# Patient Record
Sex: Female | Born: 1955 | Race: White | Hispanic: No | Smoking: Former smoker
Health system: Southern US, Community
[De-identification: ages and names within clinical notes are randomized; demographics above are authoritative.]

## PROBLEM LIST (undated history)

## (undated) DIAGNOSIS — Z806 Family history of leukemia: Secondary | ICD-10-CM

## (undated) DIAGNOSIS — K5790 Diverticulosis of intestine, part unspecified, without perforation or abscess without bleeding: Secondary | ICD-10-CM

## (undated) DIAGNOSIS — Z8043 Family history of malignant neoplasm of testis: Secondary | ICD-10-CM

## (undated) DIAGNOSIS — I10 Essential (primary) hypertension: Secondary | ICD-10-CM

## (undated) DIAGNOSIS — M19071 Primary osteoarthritis, right ankle and foot: Secondary | ICD-10-CM

## (undated) DIAGNOSIS — C801 Malignant (primary) neoplasm, unspecified: Secondary | ICD-10-CM

## (undated) DIAGNOSIS — N390 Urinary tract infection, site not specified: Secondary | ICD-10-CM

## (undated) DIAGNOSIS — E669 Obesity, unspecified: Secondary | ICD-10-CM

## (undated) DIAGNOSIS — I119 Hypertensive heart disease without heart failure: Secondary | ICD-10-CM

## (undated) DIAGNOSIS — R809 Proteinuria, unspecified: Secondary | ICD-10-CM

## (undated) DIAGNOSIS — Z923 Personal history of irradiation: Secondary | ICD-10-CM

## (undated) DIAGNOSIS — I5042 Chronic combined systolic (congestive) and diastolic (congestive) heart failure: Secondary | ICD-10-CM

## (undated) DIAGNOSIS — N951 Menopausal and female climacteric states: Secondary | ICD-10-CM

## (undated) DIAGNOSIS — K279 Peptic ulcer, site unspecified, unspecified as acute or chronic, without hemorrhage or perforation: Secondary | ICD-10-CM

## (undated) DIAGNOSIS — I428 Other cardiomyopathies: Secondary | ICD-10-CM

## (undated) DIAGNOSIS — N183 Chronic kidney disease, stage 3 unspecified: Secondary | ICD-10-CM

## (undated) DIAGNOSIS — R112 Nausea with vomiting, unspecified: Secondary | ICD-10-CM

## (undated) DIAGNOSIS — B373 Candidiasis of vulva and vagina: Secondary | ICD-10-CM

## (undated) DIAGNOSIS — Z9889 Other specified postprocedural states: Secondary | ICD-10-CM

## (undated) DIAGNOSIS — Z801 Family history of malignant neoplasm of trachea, bronchus and lung: Secondary | ICD-10-CM

## (undated) DIAGNOSIS — D509 Iron deficiency anemia, unspecified: Secondary | ICD-10-CM

## (undated) DIAGNOSIS — M19072 Primary osteoarthritis, left ankle and foot: Secondary | ICD-10-CM

## (undated) DIAGNOSIS — Z8719 Personal history of other diseases of the digestive system: Secondary | ICD-10-CM

## (undated) DIAGNOSIS — L409 Psoriasis, unspecified: Secondary | ICD-10-CM

## (undated) DIAGNOSIS — E785 Hyperlipidemia, unspecified: Secondary | ICD-10-CM

## (undated) DIAGNOSIS — Z800 Family history of malignant neoplasm of digestive organs: Secondary | ICD-10-CM

## (undated) DIAGNOSIS — Z9221 Personal history of antineoplastic chemotherapy: Secondary | ICD-10-CM

## (undated) DIAGNOSIS — E1169 Type 2 diabetes mellitus with other specified complication: Secondary | ICD-10-CM

## (undated) DIAGNOSIS — Z720 Tobacco use: Secondary | ICD-10-CM

## (undated) DIAGNOSIS — N2581 Secondary hyperparathyroidism of renal origin: Secondary | ICD-10-CM

## (undated) DIAGNOSIS — B3731 Acute candidiasis of vulva and vagina: Secondary | ICD-10-CM

## (undated) DIAGNOSIS — K760 Fatty (change of) liver, not elsewhere classified: Secondary | ICD-10-CM

## (undated) DIAGNOSIS — Z803 Family history of malignant neoplasm of breast: Secondary | ICD-10-CM

## (undated) DIAGNOSIS — R0600 Dyspnea, unspecified: Secondary | ICD-10-CM

## (undated) HISTORY — PX: DILATION AND CURETTAGE OF UTERUS: SHX78

## (undated) HISTORY — DX: Acute candidiasis of vulva and vagina: B37.31

## (undated) HISTORY — DX: Primary osteoarthritis, right ankle and foot: M19.072

## (undated) HISTORY — DX: Type 2 diabetes mellitus with other specified complication: E11.69

## (undated) HISTORY — DX: Family history of malignant neoplasm of testis: Z80.43

## (undated) HISTORY — PX: COLONOSCOPY: SHX174

## (undated) HISTORY — DX: Family history of malignant neoplasm of trachea, bronchus and lung: Z80.1

## (undated) HISTORY — DX: Family history of malignant neoplasm of digestive organs: Z80.0

## (undated) HISTORY — DX: Psoriasis, unspecified: L40.9

## (undated) HISTORY — DX: Hypertensive heart disease without heart failure: I11.9

## (undated) HISTORY — DX: Chronic kidney disease, stage 3 (moderate): N18.3

## (undated) HISTORY — DX: Urinary tract infection, site not specified: N39.0

## (undated) HISTORY — DX: Obesity, unspecified: E66.9

## (undated) HISTORY — DX: Menopausal and female climacteric states: N95.1

## (undated) HISTORY — DX: Iron deficiency anemia, unspecified: D50.9

## (undated) HISTORY — DX: Family history of malignant neoplasm of breast: Z80.3

## (undated) HISTORY — DX: Peptic ulcer, site unspecified, unspecified as acute or chronic, without hemorrhage or perforation: K27.9

## (undated) HISTORY — DX: Other cardiomyopathies: I42.8

## (undated) HISTORY — DX: Family history of leukemia: Z80.6

## (undated) HISTORY — PX: OTHER SURGICAL HISTORY: SHX169

## (undated) HISTORY — DX: Proteinuria, unspecified: R80.9

## (undated) HISTORY — DX: Secondary hyperparathyroidism of renal origin: N25.81

## (undated) HISTORY — DX: Hyperlipidemia, unspecified: E78.5

## (undated) HISTORY — DX: Other disorders of phosphorus metabolism: E83.39

## (undated) HISTORY — DX: Fatty (change of) liver, not elsewhere classified: K76.0

## (undated) HISTORY — DX: Primary osteoarthritis, right ankle and foot: M19.071

## (undated) HISTORY — DX: Personal history of other diseases of the digestive system: Z87.19

## (undated) HISTORY — DX: Chronic kidney disease, stage 3 unspecified: N18.30

## (undated) HISTORY — DX: Candidiasis of vulva and vagina: B37.3

## (undated) HISTORY — DX: Chronic combined systolic (congestive) and diastolic (congestive) heart failure: I50.42

## (undated) HISTORY — DX: Tobacco use: Z72.0

---

## 2011-03-28 ENCOUNTER — Inpatient Hospital Stay (HOSPITAL_COMMUNITY)
Admission: EM | Admit: 2011-03-28 | Discharge: 2011-04-01 | DRG: 379 | Disposition: A | Discharge status: Home / Self Care | Payer: Managed Care, Other (non HMO) | Attending: Internal Medicine | Admitting: Internal Medicine

## 2011-03-28 ENCOUNTER — Emergency Department (HOSPITAL_COMMUNITY): Payer: Managed Care, Other (non HMO)

## 2011-03-28 DIAGNOSIS — E876 Hypokalemia: Secondary | ICD-10-CM | POA: Diagnosis present

## 2011-03-28 DIAGNOSIS — K5730 Diverticulosis of large intestine without perforation or abscess without bleeding: Secondary | ICD-10-CM | POA: Diagnosis present

## 2011-03-28 DIAGNOSIS — N899 Noninflammatory disorder of vagina, unspecified: Secondary | ICD-10-CM | POA: Diagnosis present

## 2011-03-28 DIAGNOSIS — K439 Ventral hernia without obstruction or gangrene: Secondary | ICD-10-CM | POA: Diagnosis present

## 2011-03-28 DIAGNOSIS — I10 Essential (primary) hypertension: Secondary | ICD-10-CM | POA: Diagnosis present

## 2011-03-28 DIAGNOSIS — K921 Melena: Principal | ICD-10-CM | POA: Diagnosis present

## 2011-03-28 DIAGNOSIS — K648 Other hemorrhoids: Secondary | ICD-10-CM | POA: Diagnosis present

## 2011-03-28 DIAGNOSIS — K5900 Constipation, unspecified: Secondary | ICD-10-CM | POA: Diagnosis present

## 2011-03-28 DIAGNOSIS — K429 Umbilical hernia without obstruction or gangrene: Secondary | ICD-10-CM | POA: Diagnosis present

## 2011-03-28 DIAGNOSIS — K644 Residual hemorrhoidal skin tags: Secondary | ICD-10-CM | POA: Diagnosis present

## 2011-03-28 LAB — CBC
HCT: 41.4 % (ref 36.0–46.0)
Hemoglobin: 14 g/dL (ref 12.0–15.0)
MCH: 28.1 pg (ref 26.0–34.0)
MCHC: 33.8 g/dL (ref 30.0–36.0)
MCV: 83.1 fL (ref 78.0–100.0)
Platelets: 195 10*3/uL (ref 150–400)
RBC: 4.98 MIL/uL (ref 3.87–5.11)
RDW: 13.9 % (ref 11.5–15.5)
WBC: 8.7 10*3/uL (ref 4.0–10.5)

## 2011-03-28 LAB — URINALYSIS, ROUTINE W REFLEX MICROSCOPIC
Bilirubin Urine: NEGATIVE
Glucose, UA: 100 mg/dL — AB
Ketones, ur: NEGATIVE mg/dL
Leukocytes, UA: NEGATIVE
Nitrite: NEGATIVE
Protein, ur: 100 mg/dL — AB
Specific Gravity, Urine: 1.014 (ref 1.005–1.030)
Urobilinogen, UA: 0.2 mg/dL (ref 0.0–1.0)
pH: 7 (ref 5.0–8.0)

## 2011-03-28 LAB — DIFFERENTIAL
Basophils Absolute: 0 10*3/uL (ref 0.0–0.1)
Basophils Relative: 0 % (ref 0–1)
Eosinophils Absolute: 0.1 10*3/uL (ref 0.0–0.7)
Eosinophils Relative: 1 % (ref 0–5)
Lymphocytes Relative: 11 % — ABNORMAL LOW (ref 12–46)
Lymphs Abs: 0.9 10*3/uL (ref 0.7–4.0)
Monocytes Absolute: 0.5 10*3/uL (ref 0.1–1.0)
Monocytes Relative: 6 % (ref 3–12)
Neutro Abs: 7.1 10*3/uL (ref 1.7–7.7)
Neutrophils Relative %: 82 % — ABNORMAL HIGH (ref 43–77)

## 2011-03-28 LAB — URINE MICROSCOPIC-ADD ON

## 2011-03-28 LAB — OCCULT BLOOD, POC DEVICE: Fecal Occult Bld: POSITIVE

## 2011-03-28 LAB — PROTIME-INR
INR: 0.9 (ref 0.00–1.49)
Prothrombin Time: 12.4 seconds (ref 11.6–15.2)

## 2011-03-28 LAB — TYPE AND SCREEN
ABO/RH(D): O POS
Antibody Screen: NEGATIVE

## 2011-03-28 LAB — ABO/RH: ABO/RH(D): O POS

## 2011-03-28 LAB — APTT: aPTT: 28 seconds (ref 24–37)

## 2011-03-28 MED ORDER — IOHEXOL 300 MG/ML  SOLN: 100.0000 mL | Freq: Once | INTRAMUSCULAR | Status: AC | PRN

## 2011-03-29 LAB — BASIC METABOLIC PANEL
BUN: 16 mg/dL (ref 6–23)
BUN: 13 mg/dL (ref 6–23)
CO2: 25 mEq/L (ref 19–32)
CO2: 23 mEq/L (ref 19–32)
Calcium: 8.1 mg/dL — ABNORMAL LOW (ref 8.4–10.5)
Calcium: 8.5 mg/dL (ref 8.4–10.5)
Chloride: 109 mEq/L (ref 96–112)
Chloride: 110 mEq/L (ref 96–112)
Creatinine, Ser: 1 mg/dL (ref 0.4–1.2)
Creatinine, Ser: 0.97 mg/dL (ref 0.4–1.2)
GFR calc Af Amer: >60 mL/min (ref >60)
GFR calc Af Amer: >60 mL/min (ref >60)
GFR calc non Af Amer: 57 mL/min — ABNORMAL LOW (ref >60)
GFR calc non Af Amer: 59 mL/min — ABNORMAL LOW (ref >60)
Glucose, Bld: 105 mg/dL — ABNORMAL HIGH (ref 70–99)
Glucose, Bld: 101 mg/dL — ABNORMAL HIGH (ref 70–99)
Potassium: 3 mEq/L — ABNORMAL LOW (ref 3.5–5.1)
Potassium: 4.1 mEq/L (ref 3.5–5.1)
Sodium: 140 mEq/L (ref 135–145)
Sodium: 138 mEq/L (ref 135–145)

## 2011-03-29 LAB — CBC
HCT: 38.5 % (ref 36.0–46.0)
HCT: 40.5 % (ref 36.0–46.0)
Hemoglobin: 12.8 g/dL (ref 12.0–15.0)
Hemoglobin: 13.3 g/dL (ref 12.0–15.0)
MCH: 27.6 pg (ref 26.0–34.0)
MCH: 27.5 pg (ref 26.0–34.0)
MCHC: 33.2 g/dL (ref 30.0–36.0)
MCHC: 32.8 g/dL (ref 30.0–36.0)
MCV: 83.2 fL (ref 78.0–100.0)
MCV: 83.9 fL (ref 78.0–100.0)
Platelets: 184 10*3/uL (ref 150–400)
Platelets: 201 10*3/uL (ref 150–400)
RBC: 4.63 MIL/uL (ref 3.87–5.11)
RBC: 4.83 MIL/uL (ref 3.87–5.11)
RDW: 13.8 % (ref 11.5–15.5)
RDW: 14.1 % (ref 11.5–15.5)
WBC: 7.4 10*3/uL (ref 4.0–10.5)
WBC: 6.4 10*3/uL (ref 4.0–10.5)

## 2011-03-29 LAB — HEMOGLOBIN AND HEMATOCRIT, BLOOD
HCT: 39.4 % (ref 36.0–46.0)
HCT: 40.8 % (ref 36.0–46.0)
Hemoglobin: 13 g/dL (ref 12.0–15.0)
Hemoglobin: 13.5 g/dL (ref 12.0–15.0)

## 2011-03-29 MED ORDER — IOHEXOL 300 MG/ML  SOLN
100.0000 mL | Freq: Once | INTRAMUSCULAR | Status: AC | PRN
  Administered 2011-03-29: 100 mL via INTRAVENOUS

## 2011-03-30 ENCOUNTER — Other Ambulatory Visit: Payer: Self-pay | Admitting: Gastroenterology

## 2011-03-30 LAB — DIFFERENTIAL
Basophils Absolute: 0 10*3/uL (ref 0.0–0.1)
Basophils Relative: 0 % (ref 0–1)
Eosinophils Absolute: 0.2 10*3/uL (ref 0.0–0.7)
Eosinophils Relative: 4 % (ref 0–5)
Lymphocytes Relative: 32 % (ref 12–46)
Lymphs Abs: 1.9 10*3/uL (ref 0.7–4.0)
Monocytes Absolute: 0.4 10*3/uL (ref 0.1–1.0)
Monocytes Relative: 6 % (ref 3–12)
Neutro Abs: 3.6 10*3/uL (ref 1.7–7.7)
Neutrophils Relative %: 58 % (ref 43–77)

## 2011-03-30 LAB — CBC
HCT: 39.2 % (ref 36.0–46.0)
HCT: 40.1 % (ref 36.0–46.0)
HCT: 38.3 % (ref 36.0–46.0)
HCT: 36.1 % (ref 36.0–46.0)
Hemoglobin: 12.8 g/dL (ref 12.0–15.0)
Hemoglobin: 12.8 g/dL (ref 12.0–15.0)
Hemoglobin: 12.3 g/dL (ref 12.0–15.0)
Hemoglobin: 11.8 g/dL — ABNORMAL LOW (ref 12.0–15.0)
MCH: 27.5 pg (ref 26.0–34.0)
MCH: 27.1 pg (ref 26.0–34.0)
MCH: 27.3 pg (ref 26.0–34.0)
MCH: 27.6 pg (ref 26.0–34.0)
MCHC: 32.7 g/dL (ref 30.0–36.0)
MCHC: 31.9 g/dL (ref 30.0–36.0)
MCHC: 32.1 g/dL (ref 30.0–36.0)
MCHC: 32.7 g/dL (ref 30.0–36.0)
MCV: 84.3 fL (ref 78.0–100.0)
MCV: 84.8 fL (ref 78.0–100.0)
MCV: 85.1 fL (ref 78.0–100.0)
MCV: 84.5 fL (ref 78.0–100.0)
Platelets: 160 10*3/uL (ref 150–400)
Platelets: 185 10*3/uL (ref 150–400)
Platelets: 185 10*3/uL (ref 150–400)
Platelets: 182 10*3/uL (ref 150–400)
RBC: 4.65 MIL/uL (ref 3.87–5.11)
RBC: 4.73 MIL/uL (ref 3.87–5.11)
RBC: 4.5 MIL/uL (ref 3.87–5.11)
RBC: 4.27 MIL/uL (ref 3.87–5.11)
RDW: 14 % (ref 11.5–15.5)
RDW: 14 % (ref 11.5–15.5)
RDW: 14.1 % (ref 11.5–15.5)
RDW: 14.1 % (ref 11.5–15.5)
WBC: 6.1 10*3/uL (ref 4.0–10.5)
WBC: 5.3 10*3/uL (ref 4.0–10.5)
WBC: 5.6 10*3/uL (ref 4.0–10.5)
WBC: 6.7 10*3/uL (ref 4.0–10.5)

## 2011-03-30 LAB — BASIC METABOLIC PANEL
BUN: 21 mg/dL (ref 6–23)
CO2: 16 mEq/L — ABNORMAL LOW (ref 19–32)
Calcium: 8.7 mg/dL (ref 8.4–10.5)
Chloride: 106 mEq/L (ref 96–112)
Creatinine, Ser: 1.21 mg/dL — ABNORMAL HIGH (ref 0.4–1.2)
GFR calc Af Amer: 56 mL/min — ABNORMAL LOW (ref >60)
GFR calc non Af Amer: 46 mL/min — ABNORMAL LOW (ref >60)
Glucose, Bld: 130 mg/dL — ABNORMAL HIGH (ref 70–99)
Potassium: 2.7 mEq/L — CL (ref 3.5–5.1)
Sodium: 139 mEq/L (ref 135–145)

## 2011-03-30 LAB — COMPREHENSIVE METABOLIC PANEL
ALT: 24 U/L (ref 0–35)
AST: 20 U/L (ref 0–37)
Albumin: 3.3 g/dL — ABNORMAL LOW (ref 3.5–5.2)
Alkaline Phosphatase: 61 U/L (ref 39–117)
BUN: 11 mg/dL (ref 6–23)
CO2: 24 mEq/L (ref 19–32)
Calcium: 8.5 mg/dL (ref 8.4–10.5)
Chloride: 112 mEq/L (ref 96–112)
Creatinine, Ser: 0.97 mg/dL (ref 0.4–1.2)
GFR calc Af Amer: >60 mL/min (ref >60)
GFR calc non Af Amer: 59 mL/min — ABNORMAL LOW (ref >60)
Glucose, Bld: 93 mg/dL (ref 70–99)
Potassium: 4 mEq/L (ref 3.5–5.1)
Sodium: 138 mEq/L (ref 135–145)
Total Bilirubin: 0.9 mg/dL (ref 0.3–1.2)
Total Protein: 5.8 g/dL — ABNORMAL LOW (ref 6.0–8.3)

## 2011-03-30 LAB — MAGNESIUM: Magnesium: 2.1 mg/dL (ref 1.5–2.5)

## 2011-03-30 NOTE — Consult Note (Signed)
[name], [name]                   ACCOUNT NO.:  [id number]  MEDICAL RECORD NO.:  [id number]           PATIENT TYPE:  I  LOCATION:  4506                         FACILITY:  [facility]  PHYSICIAN:  Nelwyn Salisbury, MD, FACGDATE OF BIRTH:  [birth year]  DATE OF CONSULTATION:  03/29/2011 DATE OF DISCHARGE:                                CONSULTATION   REASON FOR CONSULTATION:  Melena with bright red bleeding per rectum in a 55 year old white female.  ASSESSMENT: 1. Melanic stools with bright red bleeding per rectum.  Rule out     peptic ulcer disease, colonic polyps, mass, arteriovenous     malformations, etcetera. 2. Chronic constipation. 3. Reducible umbilical hernia. 4. Hypertension.  RECOMMENDATIONS: 1. Esophagogastroduodenoscopy tomorrow morning. 2. Continue with proton pump inhibitor. 3. Avoid all nonsteroidals for now. 4. Colonoscopy if esophagogastroduodenoscopy is unrevealing.  DISCUSSION:  [name] is a 55 year old white female who presented to the [facility] emergency room yesterday with history of rectal bleeding that started early in the evening.  She had three distinct episodes of bright red bleeding per rectum with some black stool.  She claims that for the last week or so she has had some abdominal cramping. She has a history of chronic constipation and can go 3-4 days without a bowel movement but has never had such symptoms in the past.  She has never had a colonoscopy.  She denies a history of peptic ulcer disease, ulcers, jaundice or colitis.  There is no history of reflux, dysphagia, odynophagia.  Her appetite is good and weight has been stable.  There is no history of syncope or near syncope with these symptoms.  She was doing fairly well and was in her usual state of health yesterday evening until her symptoms came on.  She has had some vaginal spotting for the last few months but denies any rectal bleeding prior to this episode. There is no history of  hematemesis, coffee-ground emesis or hemoptysis. She denies history of dizziness, chest pain or shortness of breath.  PAST MEDICAL HISTORY:  See list above.  ALLERGIES:  The patient has no known drug allergies.  MEDICATIONS:  The patient takes occasional nonsteroidals for toothaches but does not take any other medication on a regular basis.  SOCIAL HISTORY:  She works at [employer] for the last 29 years.  She smokes about a half-pack per day for the last 30 years.  She lives with her husband and her two sons in [place], [place].  She just moved there from [place].  She denies the use of illicit street drugs or alcohol.  REVIEW OF SYSTEMS: 1. Melanic stools with intermittent bright red bleeding per rectum. 2. Abdominal cramping. 3. Chronic constipation. 4. No history of dizziness, syncope or near-syncope. 5. No history of reflux, dysphagia, odynophagia.  PHYSICAL EXAMINATION:  The patient is a middle-aged cooperative white female in no acute distress sitting comfortably in a chair with stable vital signs except for elevated blood pressure of 180/106. Temperature 97.6, pulse 60 per minute and respirations of 18 per minute.  O2 saturations are 96% on room  air. NECK:  Supple.  No JVD, thyromegaly or lymphadenopathy. CHEST:  Clear to auscultation.  S1, S2 regular. ABDOMEN:  Soft and nontender with normal bowel sounds.  No hepatosplenomegaly.  Reducible umbilical hernia is noted on evaluation of the abdomen. RECTAL EXAMINATION:  Not done today.  LABORATORY EVALUATION ON ADMISSION:  Revealed a hemoglobin of 14 gm/dL with white count of 8.7 and platelet count of 195,000.  PT is 12.4 with INR 0.09.  BMET reveals a potassium of 2.7 with a BUN of 21, creatinine 1.21.  Calcium of 8.7, chloride of 106.  Hemoglobin today 13.5 with a hematocrit of 40.8 and potassium of 4.1, glucose 101, BUN of 13 and creatinine of 0.97.  CT scan of the abdomen and pelvis done earlier today revealed  mild focal fatty infiltration of the liver, an umbilical hernia containing fact and uterine fibroids.  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  PLAN:  As above.  Further recommendations will be made in followup.     Nelwyn Salisbury, MD, [facility]     JNM/MEDQ  D:  03/29/2011  T:  03/29/2011  Job:  [id number]  cc:   [facility]  Electronically Signed by Juanita Craver M.D. on 03/30/2011 05:47:28 AM

## 2011-03-31 LAB — CBC
HCT: 35.9 % — ABNORMAL LOW (ref 36.0–46.0)
HCT: 40.7 % (ref 36.0–46.0)
HCT: 37.3 % (ref 36.0–46.0)
HCT: 38.8 % (ref 36.0–46.0)
Hemoglobin: 11.7 g/dL — ABNORMAL LOW (ref 12.0–15.0)
Hemoglobin: 13.2 g/dL (ref 12.0–15.0)
Hemoglobin: 12.1 g/dL (ref 12.0–15.0)
Hemoglobin: 12.7 g/dL (ref 12.0–15.0)
MCH: 27.5 pg (ref 26.0–34.0)
MCH: 27.6 pg (ref 26.0–34.0)
MCH: 27.4 pg (ref 26.0–34.0)
MCH: 27.3 pg (ref 26.0–34.0)
MCHC: 32.6 g/dL (ref 30.0–36.0)
MCHC: 32.4 g/dL (ref 30.0–36.0)
MCHC: 32.4 g/dL (ref 30.0–36.0)
MCHC: 32.7 g/dL (ref 30.0–36.0)
MCV: 84.3 fL (ref 78.0–100.0)
MCV: 85 fL (ref 78.0–100.0)
MCV: 84.6 fL (ref 78.0–100.0)
MCV: 83.4 fL (ref 78.0–100.0)
Platelets: 169 10*3/uL (ref 150–400)
Platelets: 189 10*3/uL (ref 150–400)
Platelets: 176 10*3/uL (ref 150–400)
Platelets: 189 10*3/uL (ref 150–400)
RBC: 4.26 MIL/uL (ref 3.87–5.11)
RBC: 4.79 MIL/uL (ref 3.87–5.11)
RBC: 4.41 MIL/uL (ref 3.87–5.11)
RBC: 4.65 MIL/uL (ref 3.87–5.11)
RDW: 14 % (ref 11.5–15.5)
RDW: 14 % (ref 11.5–15.5)
RDW: 13.8 % (ref 11.5–15.5)
RDW: 13.9 % (ref 11.5–15.5)
WBC: 6.5 10*3/uL (ref 4.0–10.5)
WBC: 6.3 10*3/uL (ref 4.0–10.5)
WBC: 6.4 10*3/uL (ref 4.0–10.5)
WBC: 8.2 10*3/uL (ref 4.0–10.5)

## 2011-03-31 LAB — BASIC METABOLIC PANEL
BUN: 10 mg/dL (ref 6–23)
CO2: 28 mEq/L (ref 19–32)
Calcium: 9.1 mg/dL (ref 8.4–10.5)
Chloride: 105 mEq/L (ref 96–112)
Creatinine, Ser: 1.21 mg/dL — ABNORMAL HIGH (ref 0.4–1.2)
GFR calc Af Amer: 56 mL/min — ABNORMAL LOW (ref >60)
GFR calc non Af Amer: 46 mL/min — ABNORMAL LOW (ref >60)
Glucose, Bld: 115 mg/dL — ABNORMAL HIGH (ref 70–99)
Potassium: 3.9 mEq/L (ref 3.5–5.1)
Sodium: 140 mEq/L (ref 135–145)

## 2011-03-31 LAB — MAGNESIUM: Magnesium: 2.1 mg/dL (ref 1.5–2.5)

## 2011-04-01 LAB — CBC
HCT: 37.4 % (ref 36.0–46.0)
HCT: 41.4 % (ref 36.0–46.0)
HCT: 38.1 % (ref 36.0–46.0)
Hemoglobin: 12.3 g/dL (ref 12.0–15.0)
Hemoglobin: 13.4 g/dL (ref 12.0–15.0)
Hemoglobin: 12.4 g/dL (ref 12.0–15.0)
MCH: 27.7 pg (ref 26.0–34.0)
MCH: 27.3 pg (ref 26.0–34.0)
MCH: 27.7 pg (ref 26.0–34.0)
MCHC: 32.9 g/dL (ref 30.0–36.0)
MCHC: 32.4 g/dL (ref 30.0–36.0)
MCHC: 32.5 g/dL (ref 30.0–36.0)
MCV: 84.2 fL (ref 78.0–100.0)
MCV: 84.3 fL (ref 78.0–100.0)
MCV: 85 fL (ref 78.0–100.0)
Platelets: 178 10*3/uL (ref 150–400)
Platelets: 187 10*3/uL (ref 150–400)
Platelets: 179 10*3/uL (ref 150–400)
RBC: 4.44 MIL/uL (ref 3.87–5.11)
RBC: 4.91 MIL/uL (ref 3.87–5.11)
RBC: 4.48 MIL/uL (ref 3.87–5.11)
RDW: 13.8 % (ref 11.5–15.5)
RDW: 13.9 % (ref 11.5–15.5)
RDW: 13.9 % (ref 11.5–15.5)
WBC: 7.2 10*3/uL (ref 4.0–10.5)
WBC: 6.3 10*3/uL (ref 4.0–10.5)
WBC: 7.9 10*3/uL (ref 4.0–10.5)

## 2011-04-01 LAB — BASIC METABOLIC PANEL
BUN: 8 mg/dL (ref 6–23)
CO2: 28 mEq/L (ref 19–32)
Calcium: 9.2 mg/dL (ref 8.4–10.5)
Chloride: 105 mEq/L (ref 96–112)
Creatinine, Ser: 1.13 mg/dL (ref 0.4–1.2)
GFR calc Af Amer: >60 mL/min (ref >60)
GFR calc non Af Amer: 50 mL/min — ABNORMAL LOW (ref >60)
Glucose, Bld: 93 mg/dL (ref 70–99)
Potassium: 3.8 mEq/L (ref 3.5–5.1)
Sodium: 140 mEq/L (ref 135–145)

## 2011-04-05 NOTE — H&P (Signed)
[name], [name]                   ACCOUNT NO.:  [id number]  MEDICAL RECORD NO.:  [id number]           PATIENT TYPE:  E  LOCATION:  [facility]                         FACILITY:  [facility]  PHYSICIAN:  Ulice Bold, MD   DATE OF BIRTH:  September 22, 1956  DATE OF ADMISSION:  03/28/2011 DATE OF DISCHARGE:                             HISTORY & PHYSICAL   The patient's primary care is not based locally.  CHIEF COMPLAINT:  Blood in stool.  HISTORY OF PRESENT ILLNESS:  The patient is a 55 year old white female with a past medical history of hypertension who comes to the ER with a chief complaint of blood in stool.  History of present illness dates back to this evening when the patient had urged to move her bowels and when she had to pass her bowel, she saw black bowel movement and blood in her commode.  The patient later had three such episodes and when the bleeding stopped, the patient came to the ER.  The patient also complains of abdominal cramping since past 1 week.  The patient says that she has been having cramping from past 1 week and has been having vaginal spotting.  The patient has had menopause from past 1 year, but this cramping and spotting has reoccurred since 1 week.  The patient's complaint of bright red blood per rectum.  No complaint of nausea, no complaint of vomiting.  No complaint of diarrhea.  No complaint of any hematemesis or hemoptysis.  No complaint of dizziness.  No complaint of passing out.  No complaint of chest pain or difficulty breathing.  No complaint of any focal motor neurological deficits.  No complaint of fever, cough or chills.  REVIEW OF SYSTEMS:  None beside the HPI.  ALLERGIES:  The patient has no known drug allergies.  SOCIAL HISTORY:  The patient lives with her husband and sons.  The patient continues to smoke.  She has been smoking half pack from past 30- 40 years.  The patient denies drinking or any illegal drug abuse.  PAST MEDICAL HISTORY:   Possible hypertension, but the patient does not take any medications.  MEDICATIONS:  The patient does not take any over-the-counter or prescribed medication.  The patient does not use any NSAIDs.  PHYSICAL EXAMINATION:  VITAL SIGNS:  Blood pressure 181/103, pulse 79, respiratory rate 18, temperature afebrile. GENERAL:  The patient is awake, alert, oriented, well built, lying comfortably on the bed. HEENT:  Pupils are equally reactive to light and accommodation. Extraocular movement intact.  Head is atraumatic, normocephalic. NECK:  Supple. RESPIRATORY:  No acute respite distress.  Chest is clear to auscultation bilaterally. CARDIOVASCULAR:  S1-S2 is regular in rate and rhythm.  No murmurs or rubs are appreciated. GI:  Deep bowel sounds present.  Abdomen is soft and nondistended.  The patient does have umbilical hernia which is reducible and nontender. RECTAL:  The patient's rectal tone is normal.  No hemorrhage was seen, but the patient does have blood on the glove. EXTREMITIES:  No lower send edema. CNS:  Cranial nerves II-XII are grossly intact.  Moving all four extremities.  Strength is normal.  LABORATORY DATA:  Sodium 139, potassium 2.7, serum chloride 106, bicarb 16, BUN 21, serum creatinine 1.2, glucose 130.  Hemoglobin 14, platelets 195.  The patient's FOBT is positive.  IMPRESSION: 1. GI.  The patient is being admitted with melena and bright red blood     per rectum.  The patient is having upper gastrointestinal bleed     with questionable lower gastrointestinal bleed, but the patient's     hemoglobin and vitals are stable.  The patient admits no history of     nonsteroidal anti-inflammatory drug use and has no history of     prednisone use or ulcer in the past. 2. Ob/Gyn.  This could be a questionable uterine issue as the patient     has history of spotting up to 1 year. 3. Hypertension, not at goal, but at this acute state, will not aim     for blood pressure of 120  or 140, but in dizzy state with the     patient having possible bleeding, I am comfortable with a blood     pressure of 160-180. 4. Deep venous thrombosis.  We will keep the patient on deep venous     thrombosis prophylaxis.  We will use SCDs. 5. Renal.  The patient has acute kidney injury versus chronic kidney     disease as her creatinine is 1.2. 6. Fluids, electrolytes, nutrition.  The patient is hypokalemic.  PLAN: 1. We will type and cross 2 units of blood.  No need of transfusion at     this time as h/h is stable. 2. We will cycle H and H every 6 hours for next 24 hours. 3. We will replete potassium. 4. We will give IV fluids at this time. 5. We will get CT scan of her abdomen. 6. GI consult-will be needed  in the morning.   7. The patient's further clinical course depend upon how the patient does during her hospital stay.     Ulice Bold, MD     MB/MEDQ  D:  03/28/2011  T:  03/28/2011  Job:  [id number]  Electronically Signed by Ulice Bold M.D. on 04/04/2011 12:20:29 PM

## 2011-04-08 NOTE — Discharge Summary (Signed)
[name], [name]                   ACCOUNT NO.:  [id number]  MEDICAL RECORD NO.:  [id number]           PATIENT TYPE:  I  LOCATION:  4506                         FACILITY:  [facility]  PHYSICIAN:  Romero Belling, MD       DATE OF BIRTH:  1956-11-04  DATE OF ADMISSION:  03/28/2011 DATE OF DISCHARGE:  04/01/2011                        DISCHARGE SUMMARY - REFERRING   PRIMARY CARE PHYSICIAN:  Dr. Golden Pop.  DISCHARGE DIAGNOSES: 1. Melena, hematochezia with small hiatal hernia, multiple gastric     erosions, small antral ulcer is biopsied and no evidence of     Helicobacter pylori, intestinal metaplasia, dysplasia, or     malignancy identified. 2. The patient is awaiting colonoscopy. 3. Hypertension. 4. History of vaginal spotting and needing an outpatient followup with     GYN. 5. Hypokalemia, resolved.  DISCHARGE MEDICATIONS: 1. Hydralazine 50 mg p.o. t.i.d. 2. Vicodin 1-2 tablets by mouth every 4 hours as needed. 3. Isosorbide mononitrate 60 mg p.o. daily. 4. Protonix 40 mg p.o. daily.  DISPOSITION:  The patient is discharged home pending colonoscopy.  FOLLOWUP:  The patient should follow up with Dr. Golden Pop within 1 week.  The patient should also follow up with Dr. Juanita Craver in 2-4 weeks.  The patient should also follow up with GYN as needed.  DIET:  Heart-healthy.  PROCEDURES PERFORMED: 1. The patient had upper endoscopy performed by Dr. Juanita Craver on     March 30, 2011.  The upper endoscopy showed normal esophagus at the     GE junction. 2. Small hiatal hernia. 3. Multiple gastric erosions. 4. Small antral ulcer biopsied.  Normal proximal small bowel. 5. The patient also had colonoscopy on March 31, 2011, however, the     procedure was aborted secondary to poor prep.  The patient is due     to a repeat colonoscopy on April 01, 2011, which is pending right     now.  CONSULTATIONS ON THIS CASE:  Nelwyn Salisbury, MD, [facility]  OTHER PROCEDURES: 1. The patient  had a CT scan of the abdomen and pelvis, which showed     diverticulosis along the entirety of the colon, most prominent in     the sigmoid colon, without evidence of diverticulitis. 2. Fibroid uterus noted. 3. Anterior abdominal wall bulge contains multiple small bowel loops     without evidence of incarceration overlying small periumbilical     hernia contains only fat.  Mild bibasilar atelectasis and scarring     noted. 4. 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  Also,     please note the patient should also follow up with general surgeon     concerning the ventral hernia that the patient has anterior     abdominal hernia.  BRIEF HISTORY OF PRESENT ILLNESS:  This is a 55 year old female with a past medical history of hypertension, who comes to the hospital with dark stools as well as bloody stools.  The patient has had 3 such episodes and also abdominal cramping.  The patient came to the hospital and the patient had  a good hemoglobin of 14.0, 41.4.  HOSPITAL COURSE: 1. Melena, hematochezia.  The patient was admitted to the hospital.     The patient initially had a CBC q.12 h. where the patient's     hemoglobin was relatively stable and she was not anemic.  The     patient was initially placed on Protonix 40 mg IV b.i.d.  The     patient was seen in consultation by Dr. Juanita Craver.  The patient's     upper endoscopy which showed some antral ulcers and gastric     erosions, but no source to identify series upper GI bleed.  The     patient had biopsies of the antral ulcers, which were negative for     H. pylori as well as malignancy.  The patient also had a     colonoscopy om March 31, 2011, which had nothing could be seen     secondary to poor prep.  The patient is due to have a repeat     colonoscopy on April 01, 2011.  Depending on the result, the     patient could most likely be discharged home April 01, 2011. 2. Hypertension.  The patient's blood pressure  has been uncontrolled.     The patient's pulse is on the low side about 60-70.  I am also     hesitant to start AV blocking agents for blood pressure control.  I     started the patient on hydralazine and Imdur.  I will up titrate     Imdur up to 60 mg a day and hydralazine 50 mg p.o. t.i.d. 3. Vaginal spotting.  The patient has had vaginal spotting.  The     patient should have outpatient followup with a GYN physician. 4. Ventral hernia.  For the ventral hernia, the patient should have an     outpatient evaluation with the surgeon for possible correction.     The hernia is reducible. 5. Hypokalemia.  The patient had some hypokalemia which has since     resolved. 6. DVT prophylaxis.  The patient received SCDs.     Romero Belling, MD     NH/MEDQ  D:  04/01/2011  T:  04/01/2011  Job:  [id number]  cc:   Nelwyn Salisbury, MD, Elgie Collard D. Benson Norway, MD  Electronically Signed by Romero Belling MD on 04/08/2011 10:04:17 PM

## 2011-04-08 NOTE — Discharge Summary (Signed)
  [name], [name]                   ACCOUNT NO.:  [id number]  MEDICAL RECORD NO.:  [id number]           PATIENT TYPE:  I  LOCATION:  4506                         FACILITY:  [facility]  PHYSICIAN:  Romero Belling, MD       DATE OF BIRTH:  11/01/1956  DATE OF ADMISSION:  03/28/2011 DATE OF DISCHARGE:                        DISCHARGE SUMMARY - REFERRING   ADDENDUM:  DISCHARGE DIAGNOSES:  Moderate diverticulosis, internal and external hemorrhoids.  There was colonoscopy that was done by Dr. Carol Ada on 04/01/2011.  Please note that the patient will be discharged home.  The patient should have an outpatient follow-up with Dr. Juanita Craver.  If the patient has any further bleeding, the patient will need a capsule endoscopy.     Romero Belling, MD     NH/MEDQ  D:  04/01/2011  T:  04/01/2011  Job:  [id number]  cc:   Tory Emerald. Benson Norway, MD  Electronically Signed by Romero Belling MD on 04/08/2011 10:04:27 PM

## 2012-09-30 ENCOUNTER — Inpatient Hospital Stay (HOSPITAL_COMMUNITY)
Admission: EM | Admit: 2012-09-30 | Discharge: 2012-10-03 | DRG: 379 | Disposition: A | Discharge status: Home / Self Care | Payer: Managed Care, Other (non HMO) | Attending: Internal Medicine | Admitting: Internal Medicine

## 2012-09-30 ENCOUNTER — Encounter (HOSPITAL_COMMUNITY): Payer: Self-pay | Admitting: *Deleted

## 2012-09-30 DIAGNOSIS — I10 Essential (primary) hypertension: Secondary | ICD-10-CM

## 2012-09-30 DIAGNOSIS — F17200 Nicotine dependence, unspecified, uncomplicated: Secondary | ICD-10-CM | POA: Diagnosis present

## 2012-09-30 DIAGNOSIS — K922 Gastrointestinal hemorrhage, unspecified: Secondary | ICD-10-CM

## 2012-09-30 DIAGNOSIS — R1110 Vomiting, unspecified: Secondary | ICD-10-CM

## 2012-09-30 DIAGNOSIS — Z79899 Other long term (current) drug therapy: Secondary | ICD-10-CM

## 2012-09-30 DIAGNOSIS — K5792 Diverticulitis of intestine, part unspecified, without perforation or abscess without bleeding: Secondary | ICD-10-CM

## 2012-09-30 DIAGNOSIS — I110 Hypertensive heart disease with heart failure: Secondary | ICD-10-CM

## 2012-09-30 DIAGNOSIS — K5733 Diverticulitis of large intestine without perforation or abscess with bleeding: Principal | ICD-10-CM | POA: Diagnosis present

## 2012-09-30 HISTORY — DX: Essential (primary) hypertension: I10

## 2012-09-30 HISTORY — DX: Diverticulosis of intestine, part unspecified, without perforation or abscess without bleeding: K57.90

## 2012-09-30 LAB — COMPREHENSIVE METABOLIC PANEL
ALT: 13 U/L (ref 0–35)
AST: 16 U/L (ref 0–37)
Albumin: 3.5 g/dL (ref 3.5–5.2)
Alkaline Phosphatase: 89 U/L (ref 39–117)
BUN: 28 mg/dL — ABNORMAL HIGH (ref 6–23)
CO2: 23 mEq/L (ref 19–32)
Calcium: 9.7 mg/dL (ref 8.4–10.5)
Chloride: 108 mEq/L (ref 96–112)
Creatinine, Ser: 0.96 mg/dL (ref 0.50–1.10)
GFR calc Af Amer: 75 mL/min — ABNORMAL LOW (ref >90)
GFR calc non Af Amer: 64 mL/min — ABNORMAL LOW (ref >90)
Glucose, Bld: 149 mg/dL — ABNORMAL HIGH (ref 70–99)
Potassium: 3.5 mEq/L (ref 3.5–5.1)
Sodium: 143 mEq/L (ref 135–145)
Total Bilirubin: 0.3 mg/dL (ref 0.3–1.2)
Total Protein: 6.7 g/dL (ref 6.0–8.3)

## 2012-09-30 LAB — CBC WITH DIFFERENTIAL/PLATELET
Basophils Absolute: 0 10*3/uL (ref 0.0–0.1)
Basophils Absolute: 0 10*3/uL (ref 0.0–0.1)
Basophils Relative: 0 % (ref 0–1)
Basophils Relative: 0 % (ref 0–1)
Eosinophils Absolute: 0.1 10*3/uL (ref 0.0–0.7)
Eosinophils Absolute: 0 10*3/uL (ref 0.0–0.7)
Eosinophils Relative: 1 % (ref 0–5)
Eosinophils Relative: 0 % (ref 0–5)
HCT: 39.6 % (ref 36.0–46.0)
HCT: 39.5 % (ref 36.0–46.0)
Hemoglobin: 13.3 g/dL (ref 12.0–15.0)
Hemoglobin: 13.1 g/dL (ref 12.0–15.0)
Lymphocytes Relative: 8 % — ABNORMAL LOW (ref 12–46)
Lymphocytes Relative: 10 % — ABNORMAL LOW (ref 12–46)
Lymphs Abs: 1.1 10*3/uL (ref 0.7–4.0)
Lymphs Abs: 1.2 10*3/uL (ref 0.7–4.0)
MCH: 27.6 pg (ref 26.0–34.0)
MCH: 27.3 pg (ref 26.0–34.0)
MCHC: 33.6 g/dL (ref 30.0–36.0)
MCHC: 33.2 g/dL (ref 30.0–36.0)
MCV: 82.2 fL (ref 78.0–100.0)
MCV: 82.5 fL (ref 78.0–100.0)
Monocytes Absolute: 0.5 10*3/uL (ref 0.1–1.0)
Monocytes Absolute: 0.3 10*3/uL (ref 0.1–1.0)
Monocytes Relative: 3 % (ref 3–12)
Monocytes Relative: 2 % — ABNORMAL LOW (ref 3–12)
Neutro Abs: 12.2 10*3/uL — ABNORMAL HIGH (ref 1.7–7.7)
Neutro Abs: 10.7 10*3/uL — ABNORMAL HIGH (ref 1.7–7.7)
Neutrophils Relative %: 88 % — ABNORMAL HIGH (ref 43–77)
Neutrophils Relative %: 88 % — ABNORMAL HIGH (ref 43–77)
Platelets: 213 10*3/uL (ref 150–400)
Platelets: 211 10*3/uL (ref 150–400)
RBC: 4.82 MIL/uL (ref 3.87–5.11)
RBC: 4.79 MIL/uL (ref 3.87–5.11)
RDW: 13.8 % (ref 11.5–15.5)
RDW: 13.9 % (ref 11.5–15.5)
WBC: 13.9 10*3/uL — ABNORMAL HIGH (ref 4.0–10.5)
WBC: 12.3 10*3/uL — ABNORMAL HIGH (ref 4.0–10.5)

## 2012-09-30 LAB — SAMPLE TO BLOOD BANK

## 2012-09-30 MED ORDER — SODIUM CHLORIDE 0.9 % IV SOLN
INTRAVENOUS | Status: DC
  Administered 2012-09-30: via INTRAVENOUS

## 2012-09-30 MED ORDER — IOHEXOL 300 MG/ML  SOLN
20.0000 mL | INTRAMUSCULAR | Status: AC
  Administered 2012-09-30 – 2012-10-01 (×2): 20 mL via ORAL

## 2012-09-30 NOTE — ED Notes (Signed)
The pt has had abd pain and bright red bloody diarrhea x7 today.  She has history of diverticulitis

## 2012-09-30 NOTE — ED Provider Notes (Signed)
History     CSN: [id number]  Arrival date & time 09/30/12  2006   First MD Initiated Contact with Patient 09/30/12 2214      Chief Complaint  Patient presents with  . Abdominal Pain    (Consider location/radiation/quality/duration/timing/severity/associated sxs/prior treatment) HPI Complains of left-sided abdominal pain and several episodes of blood per rectum onset today approximately 4 hours ago. Admits to some lightheadedness pain at left abdomen is nonradiating severe sharp intermittent lasting several minutes at a time presently mild. Patient vomited twice today however no hematemesis No treatment prior to coming here Past Medical History  Diagnosis Date  . Diverticular disease   . Hypertension    noncompliant with antihypertensive medications  History reviewed. No pertinent past surgical history.  No family history on file.  History  Substance Use Topics  . Smoking status: Current Every Day Smoker  . Smokeless tobacco: Not on file  . Alcohol Use: No    OB History    Grav Para Term Preterm Abortions TAB SAB Ect Mult Living                  Review of Systems  Constitutional: Negative.   HENT: Negative.   Respiratory: Negative.   Cardiovascular: Negative.   Gastrointestinal: Positive for nausea, vomiting, abdominal pain and blood in stool.  Musculoskeletal: Negative.   Skin: Negative.   Neurological: Negative.   Hematological: Negative.   Psychiatric/Behavioral: Negative.   All other systems reviewed and are negative.    Allergies  Review of patient's allergies indicates not on file.  Home Medications  No current outpatient prescriptions on file.  BP 169/110  Pulse 78  Temp 97.9 F (36.6 C) (Oral)  Resp 20  SpO2 98%  Physical Exam  Nursing note and vitals reviewed. Constitutional: She appears well-developed and well-nourished.  HENT:  Head: Normocephalic and atraumatic.  Eyes: Conjunctivae normal are normal. Pupils are equal, round, and  reactive to light.  Neck: Neck supple. No tracheal deviation present. No thyromegaly present.  Cardiovascular: Normal rate and regular rhythm.   No murmur heard. Pulmonary/Chest: Effort normal and breath sounds normal.  Abdominal: Soft. Bowel sounds are normal. She exhibits no distension. There is no tenderness.       Tender left upper and left lower quadrants no guarding rigidity or rebound. Umbilical hernia soft easily reproducible  Genitourinary:       Gross blood per rectum  Musculoskeletal: Normal range of motion. She exhibits no edema and no tenderness.  Neurological: She is alert. Coordination normal.  Skin: Skin is warm and dry. No rash noted.  Psychiatric: She has a normal mood and affect.    ED Course  Procedures (including critical care time)   Labs Reviewed  CBC WITH DIFFERENTIAL  COMPREHENSIVE METABOLIC PANEL  SAMPLE TO BLOOD BANK  URINALYSIS, ROUTINE W REFLEX MICROSCOPIC   No results found.   No diagnosis found.    Date: 09/30/2012  Rate: 80  Rhythm: normal sinus rhythm  QRS Axis: normal  Intervals: normal  ST/T Wave abnormalities: nonspecific ST/T changes  Conduction Disutrbances:none  Narrative Interpretation: possible inferolaterl ischemia vs strain pattern, , LVH  Old EKG Reviewed: none available Results for orders placed during the hospital encounter of 09/30/12  CBC WITH DIFFERENTIAL      Component Value Range   WBC 13.9 (*) 4.0 - 10.5 K/uL   RBC 4.82  3.87 - 5.11 MIL/uL   Hemoglobin 13.3  12.0 - 15.0 g/dL   HCT 39.6  36.0 -  46.0 %   MCV 82.2  78.0 - 100.0 fL   MCH 27.6  26.0 - 34.0 pg   MCHC 33.6  30.0 - 36.0 g/dL   RDW 13.8  11.5 - 15.5 %   Platelets 213  150 - 400 K/uL   Neutrophils Relative 88 (*) 43 - 77 %   Neutro Abs 12.2 (*) 1.7 - 7.7 K/uL   Lymphocytes Relative 8 (*) 12 - 46 %   Lymphs Abs 1.1  0.7 - 4.0 K/uL   Monocytes Relative 3  3 - 12 %   Monocytes Absolute 0.5  0.1 - 1.0 K/uL   Eosinophils Relative 1  0 - 5 %   Eosinophils  Absolute 0.1  0.0 - 0.7 K/uL   Basophils Relative 0  0 - 1 %   Basophils Absolute 0.0  0.0 - 0.1 K/uL  COMPREHENSIVE METABOLIC PANEL      Component Value Range   Sodium 143  135 - 145 mEq/L   Potassium 3.5  3.5 - 5.1 mEq/L   Chloride 108  96 - 112 mEq/L   CO2 23  19 - 32 mEq/L   Glucose, Bld 149 (*) 70 - 99 mg/dL   BUN 28 (*) 6 - 23 mg/dL   Creatinine, Ser 0.96  0.50 - 1.10 mg/dL   Calcium 9.7  8.4 - 10.5 mg/dL   Total Protein 6.7  6.0 - 8.3 g/dL   Albumin 3.5  3.5 - 5.2 g/dL   AST 16  0 - 37 U/L   ALT 13  0 - 35 U/L   Alkaline Phosphatase 89  39 - 117 U/L   Total Bilirubin 0.3  0.3 - 1.2 mg/dL   GFR calc non Af Amer 64 (*) >90 mL/min   GFR calc Af Amer 75 (*) >90 mL/min  SAMPLE TO BLOOD BANK      Component Value Range   Blood Bank Specimen SAMPLE AVAILABLE FOR TESTING     Sample Expiration 10/01/2012    URINALYSIS, ROUTINE W REFLEX MICROSCOPIC      Component Value Range   Color, Urine YELLOW  YELLOW   APPearance CLOUDY (*) CLEAR   Specific Gravity, Urine 1.025  1.005 - 1.030   pH 5.5  5.0 - 8.0   Glucose, UA NEGATIVE  NEGATIVE mg/dL   Hgb urine dipstick NEGATIVE  NEGATIVE   Bilirubin Urine NEGATIVE  NEGATIVE   Ketones, ur NEGATIVE  NEGATIVE mg/dL   Protein, ur 100 (*) NEGATIVE mg/dL   Urobilinogen, UA 0.2  0.0 - 1.0 mg/dL   Nitrite NEGATIVE  NEGATIVE   Leukocytes, UA NEGATIVE  NEGATIVE  CBC WITH DIFFERENTIAL      Component Value Range   WBC 12.3 (*) 4.0 - 10.5 K/uL   RBC 4.79  3.87 - 5.11 MIL/uL   Hemoglobin 13.1  12.0 - 15.0 g/dL   HCT 39.5  36.0 - 46.0 %   MCV 82.5  78.0 - 100.0 fL   MCH 27.3  26.0 - 34.0 pg   MCHC 33.2  30.0 - 36.0 g/dL   RDW 13.9  11.5 - 15.5 %   Platelets 211  150 - 400 K/uL   Neutrophils Relative 88 (*) 43 - 77 %   Neutro Abs 10.7 (*) 1.7 - 7.7 K/uL   Lymphocytes Relative 10 (*) 12 - 46 %   Lymphs Abs 1.2  0.7 - 4.0 K/uL   Monocytes Relative 2 (*) 3 - 12 %   Monocytes Absolute 0.3  0.1 -  1.0 K/uL   Eosinophils Relative 0  0 - 5 %    Eosinophils Absolute 0.0  0.0 - 0.7 K/uL   Basophils Relative 0  0 - 1 %   Basophils Absolute 0.0  0.0 - 0.1 K/uL  BASIC METABOLIC PANEL      Component Value Range   Sodium 140  135 - 145 mEq/L   Potassium 3.1 (*) 3.5 - 5.1 mEq/L   Chloride 107  96 - 112 mEq/L   CO2 22  19 - 32 mEq/L   Glucose, Bld 136 (*) 70 - 99 mg/dL   BUN 28 (*) 6 - 23 mg/dL   Creatinine, Ser 0.98  0.50 - 1.10 mg/dL   Calcium 9.9  8.4 - 10.5 mg/dL   GFR calc non Af Amer 63 (*) >90 mL/min   GFR calc Af Amer 73 (*) >90 mL/min  URINE MICROSCOPIC-ADD ON      Component Value Range   Squamous Epithelial / LPF MANY (*) RARE   WBC, UA 3-6  <3 WBC/hpf   RBC / HPF 0-2  <3 RBC/hpf   Bacteria, UA MANY (*) RARE   Urine-Other MUCOUS PRESENT     No results found.  1215 am pt declines pain medication and antiemetic. Pt signed out to Dr. Sharol Given at 1240 am . CT scan pending MDM  Anticipate admission Dx #1 Acute GI bleed #2 hypokalemia #3 hyperglycemia        Orlie Dakin, MD 10/02/12 [id number]

## 2012-09-30 NOTE — ED Notes (Signed)
The pt has episodes of acute diaphoresis during the day

## 2012-10-01 ENCOUNTER — Emergency Department (HOSPITAL_COMMUNITY): Payer: Managed Care, Other (non HMO)

## 2012-10-01 ENCOUNTER — Encounter (HOSPITAL_COMMUNITY): Payer: Self-pay | Admitting: Family Medicine

## 2012-10-01 DIAGNOSIS — K5732 Diverticulitis of large intestine without perforation or abscess without bleeding: Secondary | ICD-10-CM

## 2012-10-01 DIAGNOSIS — K922 Gastrointestinal hemorrhage, unspecified: Secondary | ICD-10-CM | POA: Diagnosis present

## 2012-10-01 DIAGNOSIS — I110 Hypertensive heart disease with heart failure: Secondary | ICD-10-CM

## 2012-10-01 DIAGNOSIS — K5792 Diverticulitis of intestine, part unspecified, without perforation or abscess without bleeding: Secondary | ICD-10-CM | POA: Diagnosis present

## 2012-10-01 LAB — BASIC METABOLIC PANEL
BUN: 28 mg/dL — ABNORMAL HIGH (ref 6–23)
BUN: 20 mg/dL (ref 6–23)
CO2: 22 mEq/L (ref 19–32)
CO2: 24 mEq/L (ref 19–32)
Calcium: 9.9 mg/dL (ref 8.4–10.5)
Calcium: 8.5 mg/dL (ref 8.4–10.5)
Chloride: 107 mEq/L (ref 96–112)
Chloride: 108 mEq/L (ref 96–112)
Creatinine, Ser: 0.98 mg/dL (ref 0.50–1.10)
Creatinine, Ser: 0.95 mg/dL (ref 0.50–1.10)
GFR calc Af Amer: 73 mL/min — ABNORMAL LOW (ref >90)
GFR calc Af Amer: 76 mL/min — ABNORMAL LOW (ref >90)
GFR calc non Af Amer: 63 mL/min — ABNORMAL LOW (ref >90)
GFR calc non Af Amer: 65 mL/min — ABNORMAL LOW (ref >90)
Glucose, Bld: 136 mg/dL — ABNORMAL HIGH (ref 70–99)
Glucose, Bld: 98 mg/dL (ref 70–99)
Potassium: 3.1 mEq/L — ABNORMAL LOW (ref 3.5–5.1)
Potassium: 3.4 mEq/L — ABNORMAL LOW (ref 3.5–5.1)
Sodium: 140 mEq/L (ref 135–145)
Sodium: 141 mEq/L (ref 135–145)

## 2012-10-01 LAB — URINE MICROSCOPIC-ADD ON

## 2012-10-01 LAB — URINALYSIS, ROUTINE W REFLEX MICROSCOPIC
Bilirubin Urine: NEGATIVE
Glucose, UA: NEGATIVE mg/dL
Hgb urine dipstick: NEGATIVE
Ketones, ur: NEGATIVE mg/dL
Leukocytes, UA: NEGATIVE
Nitrite: NEGATIVE
Protein, ur: 100 mg/dL — AB
Specific Gravity, Urine: 1.025 (ref 1.005–1.030)
Urobilinogen, UA: 0.2 mg/dL (ref 0.0–1.0)
pH: 5.5 (ref 5.0–8.0)

## 2012-10-01 LAB — POCT I-STAT TROPONIN I: Troponin i, poc: 0.05 ng/mL (ref 0.00–0.08)

## 2012-10-01 LAB — TYPE AND SCREEN
ABO/RH(D): O POS
Antibody Screen: NEGATIVE

## 2012-10-01 LAB — HEMOGLOBIN AND HEMATOCRIT, BLOOD
HCT: 34.8 % — ABNORMAL LOW (ref 36.0–46.0)
HCT: 35.8 % — ABNORMAL LOW (ref 36.0–46.0)
Hemoglobin: 11.3 g/dL — ABNORMAL LOW (ref 12.0–15.0)
Hemoglobin: 11.8 g/dL — ABNORMAL LOW (ref 12.0–15.0)

## 2012-10-01 MED ORDER — METRONIDAZOLE IN NACL 5-0.79 MG/ML-% IV SOLN
500.0000 mg | Freq: Three times a day (TID) | INTRAVENOUS | Status: DC
  Administered 2012-10-01 – 2012-10-03 (×7): 500 mg via INTRAVENOUS
  Filled 2012-10-01 (×10): qty 100

## 2012-10-01 MED ORDER — ONDANSETRON HCL 4 MG/2ML IJ SOLN: 4.0000 mg | Freq: Four times a day (QID) | INTRAMUSCULAR | Status: DC | PRN

## 2012-10-01 MED ORDER — NICOTINE 14 MG/24HR TD PT24
14.0000 mg | MEDICATED_PATCH | Freq: Every day | TRANSDERMAL | Status: DC
  Administered 2012-10-02: 14 mg via TRANSDERMAL
  Filled 2012-10-01 (×3): qty 1

## 2012-10-01 MED ORDER — ACETAMINOPHEN 325 MG PO TABS: 650.0000 mg | ORAL_TABLET | Freq: Four times a day (QID) | ORAL | Status: DC | PRN

## 2012-10-01 MED ORDER — IPRATROPIUM BROMIDE 0.02 % IN SOLN: 0.5000 mg | RESPIRATORY_TRACT | Status: DC | PRN

## 2012-10-01 MED ORDER — POTASSIUM CHLORIDE IN NACL 20-0.9 MEQ/L-% IV SOLN
INTRAVENOUS | Status: DC
  Administered 2012-10-01: 07:00:00 via INTRAVENOUS
  Filled 2012-10-01 (×2): qty 1000

## 2012-10-01 MED ORDER — POTASSIUM CHLORIDE 10 MEQ/100ML IV SOLN
10.0000 meq | Freq: Once | INTRAVENOUS | Status: AC
  Administered 2012-10-01: 10 meq via INTRAVENOUS
  Filled 2012-10-01: qty 100

## 2012-10-01 MED ORDER — ALUM & MAG HYDROXIDE-SIMETH 200-200-20 MG/5ML PO SUSP: 30.0000 mL | Freq: Four times a day (QID) | ORAL | Status: DC | PRN

## 2012-10-01 MED ORDER — PANTOPRAZOLE SODIUM 40 MG IV SOLR
40.0000 mg | INTRAVENOUS | Status: DC
  Administered 2012-10-01 – 2012-10-03 (×3): 40 mg via INTRAVENOUS
  Filled 2012-10-01 (×4): qty 40

## 2012-10-01 MED ORDER — ACETAMINOPHEN 650 MG RE SUPP: 650.0000 mg | Freq: Four times a day (QID) | RECTAL | Status: DC | PRN

## 2012-10-01 MED ORDER — METRONIDAZOLE IN NACL 5-0.79 MG/ML-% IV SOLN
500.0000 mg | Freq: Once | INTRAVENOUS | Status: AC
  Administered 2012-10-01: 500 mg via INTRAVENOUS
  Filled 2012-10-01: qty 100

## 2012-10-01 MED ORDER — HYDROCODONE-ACETAMINOPHEN 5-325 MG PO TABS: 1.0000 | ORAL_TABLET | ORAL | Status: DC | PRN

## 2012-10-01 MED ORDER — SENNOSIDES-DOCUSATE SODIUM 8.6-50 MG PO TABS: 1.0000 | ORAL_TABLET | Freq: Every evening | ORAL | Status: DC | PRN

## 2012-10-01 MED ORDER — ONDANSETRON HCL 4 MG PO TABS: 4.0000 mg | ORAL_TABLET | Freq: Four times a day (QID) | ORAL | Status: DC | PRN

## 2012-10-01 MED ORDER — ALBUTEROL SULFATE (5 MG/ML) 0.5% IN NEBU: 2.5000 mg | INHALATION_SOLUTION | RESPIRATORY_TRACT | Status: DC | PRN

## 2012-10-01 MED ORDER — IOHEXOL 300 MG/ML  SOLN: 100.0000 mL | Freq: Once | INTRAMUSCULAR | Status: AC | PRN

## 2012-10-01 MED ORDER — CIPROFLOXACIN IN D5W 400 MG/200ML IV SOLN
400.0000 mg | Freq: Once | INTRAVENOUS | Status: AC
  Administered 2012-10-01: 400 mg via INTRAVENOUS
  Filled 2012-10-01: qty 200

## 2012-10-01 MED ORDER — CIPROFLOXACIN IN D5W 200 MG/100ML IV SOLN
200.0000 mg | Freq: Two times a day (BID) | INTRAVENOUS | Status: DC
  Administered 2012-10-01 – 2012-10-02 (×4): 200 mg via INTRAVENOUS
  Filled 2012-10-01 (×6): qty 100

## 2012-10-01 MED ORDER — POTASSIUM CHLORIDE IN NACL 40-0.9 MEQ/L-% IV SOLN
INTRAVENOUS | Status: DC
  Administered 2012-10-01 – 2012-10-02 (×2): via INTRAVENOUS
  Filled 2012-10-01 (×4): qty 1000

## 2012-10-01 NOTE — Progress Notes (Signed)
[facility] PROGRESS NOTE  [name] [id number] DOB: Apr 30, 1956 DOA: 09/30/2012 PCP: No primary provider on file.  Assessment/Plan: Active Problems:  GI bleed  Diverticulitis  Hypertension     GI bleed secondary to Diverticulitis/diverticulosis  Place patient on telemetry Hemoglobin stable No recurrent bleeding overnight Type and screen  Consults Dr. Adriana Mccallum if bleeding recurs  Cipro and Flagyl ordered    Hypertension  Resume home medication , hemodynamically stable  Tobacco abuse  Nicotine patch  DuoNeb says needed   Code Status: full Family Communication: family updated about patient's clinical progress Disposition Plan:  As above    Brief narrative: 56 year old female with known history of diverticulosis. Her last diverticular bleed was in April of 2012, at that time she had a 2 colonoscopies. This evening at approximately 6:30 she found her somewhat grumbling and felt as though she had have a BM. She went to the bathroom and had a bloody diarrhea 7 times in a row. Shows had some nausea and vomiting but mostly retching. She became diaphoretic, very pale and dizzy. She decided to come to the ER   Consultants:  None  Procedures:  None  Antibiotics:  Ciprofloxacin/Flagyl  HPI/Subjective: Hemodynamically stable overnight  Objective: Filed Vitals:   09/30/12 2358 10/01/12 0133 10/01/12 0230 10/01/12 0414  BP: 160/88 154/84 138/71 179/86  Pulse: 66 71 68 61  Temp: 98.7 F (37.1 C)   97.7 F (36.5 C)  TempSrc: Oral     Resp: 19 22 17 18   Height:      Weight:      SpO2: 100% 97% 99% 99%    Intake/Output Summary (Last 24 hours) at 10/01/12 0853 Last data filed at 09/30/12 2200  Gross per 24 hour  Intake      0 ml  Output      0 ml  Net      0 ml    Exam:  General: Alert and oriented times three, well developed and nourished, pale  Eyes: PERRLA, pink conjunctiva, no scleral icterus  ENT: Moist oral mucosa, neck supple, no  thyromegaly  Lungs: clear to ascultation, no wheeze, no crackles, no use of accessory muscles  Cardiovascular: regular rate and rhythm, no regurgitation, no gallops, no murmurs. No carotid bruits, no JVD  Abdomen: soft, positive BS, non-tender, non-distended, no organomegaly, not an acute abdomen  GU: not examined  Neuro: CN II - XII grossly intact, sensation intact  Musculoskeletal: strength 5/5 all extremities, no clubbing, cyanosis or edema  Skin: no rash, no subcutaneous crepitation, no decubitus  Psych: appropriate patient    Data Reviewed: Basic Metabolic Panel:  Lab [id number] 2251 09/30/12 2055  NA 140 143  K 3.1* 3.5  CL 107 108  CO2 22 23  GLUCOSE 136* 149*  BUN 28* 28*  CREATININE 0.98 0.96  CALCIUM 9.9 9.7  MG -- --  PHOS -- --    Liver Function Tests:  Lab 09/30/12 2055  AST 16  ALT 13  ALKPHOS 89  BILITOT 0.3  PROT 6.7  ALBUMIN 3.5   No results found for this basename: LIPASE:5,AMYLASE:5 in the last 168 hours No results found for this basename: AMMONIA:5 in the last 168 hours  CBC:  Lab 09/30/12 2251 09/30/12 2055  WBC 12.3* 13.9*  NEUTROABS 10.7* 12.2*  HGB 13.1 13.3  HCT 39.5 39.6  MCV 82.5 82.2  PLT 211 213    Cardiac Enzymes: No results found for this basename: CKTOTAL:5,CKMB:5,CKMBINDEX:5,TROPONINI:5 in the last 168 hours BNP (last 3  results) No results found for this basename: PROBNP:3 in the last 8760 hours   CBG: No results found for this basename: GLUCAP:5 in the last 168 hours  No results found for this or any previous visit (from the past 240 hour(s)).   Studies: Ct Abdomen Pelvis W Contrast  10/01/2012  *RADIOLOGY REPORT*  Clinical Data: Abdominal pain, bloody diarrhea, history of diverticulitis  CT ABDOMEN AND PELVIS WITH CONTRAST  Technique:  Multidetector CT imaging of the abdomen and pelvis was performed following the standard protocol during bolus administration of intravenous contrast.  Contrast:  100 ml Omnipaque-300 IV   Comparison: 03/28/2011  Findings: Lung bases are clear.  Liver is notable for focal fat/altered perfusion along the falciform ligament.  Spleen and adrenal glands are within normal limits.  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  Visualized pancreas is unremarkable.  Gallbladder is underdistended.  No intrahepatic or extrahepatic ductal dilatation.  Kidneys are within normal limits.  No hydronephrosis.  No evidence of bowel obstruction.  Normal appendix.  Colonic diverticulosis.  Suspected focal wall thickening/inflammatory changes near the hepatic flexure (series 2/image 39), possibly reflecting mild diverticulitis.  No drainable fluid collection or abscess.  No free air.  Atherosclerotic calcifications of the abdominal aorta and branch vessels.  No abdominopelvic ascites.  No suspicious abdominopelvic lymphadenopathy.  Hypoenhancing lesion in the right uterine body (series 2/image 62), possibly reflecting a fibroid, unchanged.  No adnexal masses.  Bladder is within normal limits.  Laxity of the anterior abdominal wall.  Moderate fat-containing periumbilical hernia (series 2/image 49).  Degenerative changes of the visualized thoracolumbar spine.  IMPRESSION:  Focal wall thickening/inflammatory changes near the hepatic flexure, possibly reflecting mild diverticulitis.  No drainable fluid collection or abscess.  No hydronephrosis.  Given focal appearance, follow-up colonoscopy is suggested.   Original Report Authenticated By: Julian Hy, M.D.     Scheduled Meds:   . ciprofloxacin  200 mg Intravenous Q12H  . ciprofloxacin  400 mg Intravenous Once  . iohexol  20 mL Oral Q1 Hr x 2  . metronidazole  500 mg Intravenous Once  . metronidazole  500 mg Intravenous Q8H  . nicotine  14 mg Transdermal Daily  . pantoprazole (PROTONIX) IV  40 mg Intravenous Q24H  . potassium chloride  10 mEq Intravenous Once   Continuous Infusions:   . 0.9 % NaCl with KCl 20 mEq / L 100 mL/hr at 10/01/12 0657  .  DISCONTD: sodium chloride 125 mL/hr at 09/30/12 2340  . DISCONTD: sodium chloride 125 mL/hr at 09/30/12 2339    Active Problems:  GI bleed  Diverticulitis  Hypertension    Time spent: 40 minutes   [facility] Pager [phone]. If 8PM-8AM, please contact night-coverage at [email or web address], password [facility] 10/01/2012, 8:53 AM  LOS: 1 day

## 2012-10-01 NOTE — ED Notes (Signed)
MD at bedside. 

## 2012-10-01 NOTE — H&P (Signed)
PCP:   Neldon Labella   Chief Complaint:  Bright red blood per rectum  HPI: This is a 56 year old female with known history of diverticulosis. Her last diverticular bleed was in April of 2012, at that time she had a 2 colonoscopies. This evening at approximately 6:30 she found her somewhat grumbling and felt as though she had have a BM. She went to the bathroom and had a bloody diarrhea 7 times in a row. Shows had some nausea and vomiting but mostly retching. She became diaphoretic, very pale and dizzy. She decided to come to the ER.  Review of Systems:  The patient denies anorexia, fever, weight loss,, vision loss, decreased hearing, hoarseness, chest pain, syncope, dyspnea on exertion, peripheral edema, balance deficits, hemoptysis, severe indigestion/heartburn, hematuria, incontinence, genital sores, muscle weakness, suspicious skin lesions, transient blindness, difficulty walking, depression, unusual weight change, abnormal bleeding, enlarged lymph nodes, angioedema, and breast masses.  Past Medical History: Past Medical History  Diagnosis Date  . Diverticular disease   . Hypertension    History reviewed. No pertinent past surgical history.  Medications: Prior to Admission medications   Medication Sig Start Date End Date Taking? Authorizing Provider  acetaminophen (TYLENOL) 500 MG tablet Take 1,000 mg by mouth every 6 (six) hours as needed. For pain   Yes Historical Provider, MD    Allergies:   Allergies  Allergen Reactions  . Other Other (See Comments)    Unknown blood pressure medication caused face swelling    Social History:  reports that she has been smoking.  She does not have any smokeless tobacco history on file. She reports that she does not drink alcohol. No drug use.  Family History: CAD, hypertension  Physical Exam: Filed Vitals:   09/30/12 2300 09/30/12 2341 09/30/12 2358 10/01/12 0133  BP: 149/87  160/88 154/84  Pulse: 66  66 71  Temp:   98.7 F (37.1  C)   TempSrc:   Oral   Resp: 16  19 22   Height:  5\' 2"  (1.575 m)    Weight:  68.947 kg (152 lb)    SpO2: 97%  100% 97%    General:  Alert and oriented times three, well developed and nourished, pale Eyes: PERRLA, pink conjunctiva, no scleral icterus ENT: Moist oral mucosa, neck supple, no thyromegaly Lungs: clear to ascultation, no wheeze, no crackles, no use of accessory muscles Cardiovascular: regular rate and rhythm, no regurgitation, no gallops, no murmurs. No carotid bruits, no JVD Abdomen: soft, positive BS, non-tender, non-distended, no organomegaly, not an acute abdomen GU: not examined Neuro: CN II - XII grossly intact, sensation intact Musculoskeletal: strength 5/5 all extremities, no clubbing, cyanosis or edema Skin: no rash, no subcutaneous crepitation, no decubitus Psych: appropriate patient   Labs on Admission:   [facility] 09/30/12 2251 09/30/12 2055  NA 140 143  K 3.1* 3.5  CL 107 108  CO2 22 23  GLUCOSE 136* 149*  BUN 28* 28*  CREATININE 0.98 0.96  CALCIUM 9.9 9.7  MG -- --  PHOS -- --    Basename 09/30/12 2055  AST 16  ALT 13  ALKPHOS 89  BILITOT 0.3  PROT 6.7  ALBUMIN 3.5   No results found for this basename: LIPASE:2,AMYLASE:2 in the last 72 hours  Basename 09/30/12 2251 09/30/12 2055  WBC 12.3* 13.9*  NEUTROABS 10.7* 12.2*  HGB 13.1 13.3  HCT 39.5 39.6  MCV 82.5 82.2  PLT 211 213   No results found for this basename: CKTOTAL:3,CKMB:3,CKMBINDEX:3,TROPONINI:3 in the  last 72 hours No components found with this basename: POCBNP:3 No results found for this basename: DDIMER:2 in the last 72 hours No results found for this basename: HGBA1C:2 in the last 72 hours No results found for this basename: CHOL:2,HDL:2,LDLCALC:2,TRIG:2,CHOLHDL:2,LDLDIRECT:2 in the last 72 hours No results found for this basename: TSH,T4TOTAL,FREET3,T3FREE,THYROIDAB in the last 72 hours No results found for this basename:  VITAMINB12:2,FOLATE:2,FERRITIN:2,TIBC:2,IRON:2,RETICCTPCT:2 in the last 72 hours  Micro Results: Results for ALVITA, BORROR (MRN [id number]) as of 10/01/2012 02:32  Ref. Range 10/01/2012 00:15  Color, Urine Latest Range: YELLOW  YELLOW  APPearance Latest Range: CLEAR  CLOUDY (A)  Specific Gravity, Urine Latest Range: 1.005-1.030  1.025  pH Latest Range: 5.0-8.0  5.5  Glucose Latest Range: NEGATIVE mg/dL NEGATIVE  Bilirubin Urine Latest Range: NEGATIVE  NEGATIVE  Ketones, ur Latest Range: NEGATIVE mg/dL NEGATIVE  Protein Latest Range: NEGATIVE mg/dL 100 (A)  Urobilinogen, UA Latest Range: 0.0-1.0 mg/dL 0.2  Nitrite Latest Range: NEGATIVE  NEGATIVE  Leukocytes, UA Latest Range: NEGATIVE  NEGATIVE  Hgb urine dipstick Latest Range: NEGATIVE  NEGATIVE  Urine-Other No range found MUCOUS PRESENT  WBC, UA Latest Range: <3 WBC/hpf 3-6  RBC / HPF Latest Range: <3 RBC/hpf 0-2  Squamous Epithelial / LPF Latest Range: RARE  MANY (A)  Bacteria, UA Latest Range: RARE  MANY (A)     Radiological Exams on Admission: Ct Abdomen Pelvis W Contrast  10/01/2012  *RADIOLOGY REPORT*  Clinical Data: Abdominal pain, bloody diarrhea, history of diverticulitis  CT ABDOMEN AND PELVIS WITH CONTRAST  Technique:  Multidetector CT imaging of the abdomen and pelvis was performed following the standard protocol during bolus administration of intravenous contrast.  Contrast:  100 ml Omnipaque-300 IV  Comparison: 03/28/2011  Findings: Lung bases are clear.  Liver is notable for focal fat/altered perfusion along the falciform ligament.  Spleen and adrenal glands are within normal limits.  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  Visualized pancreas is unremarkable.  Gallbladder is underdistended.  No intrahepatic or extrahepatic ductal dilatation.  Kidneys are within normal limits.  No hydronephrosis.  No evidence of bowel obstruction.  Normal appendix.  Colonic diverticulosis.  Suspected focal wall  thickening/inflammatory changes near the hepatic flexure (series 2/image 39), possibly reflecting mild diverticulitis.  No drainable fluid collection or abscess.  No free air.  Atherosclerotic calcifications of the abdominal aorta and branch vessels.  No abdominopelvic ascites.  No suspicious abdominopelvic lymphadenopathy.  Hypoenhancing lesion in the right uterine body (series 2/image 62), possibly reflecting a fibroid, unchanged.  No adnexal masses.  Bladder is within normal limits.  Laxity of the anterior abdominal wall.  Moderate fat-containing periumbilical hernia (series 2/image 49).  Degenerative changes of the visualized thoracolumbar spine.  IMPRESSION:  Focal wall thickening/inflammatory changes near the hepatic flexure, possibly reflecting mild diverticulitis.  No drainable fluid collection or abscess.  No hydronephrosis.  Given focal appearance, follow-up colonoscopy is suggested.   Original Report Authenticated By: Julian Hy, M.D.     Assessment/Plan Present on Admission:  .GI bleed .Diverticulitis/diverticulosis Admit to MedSurg N.p.o., IV fluid hydration Serial H&H's Type and screen Consults Dr. Adriana Mccallum in the a.m. Cipro and Flagyl ordered Hypertension Resume home medication Tobacco abuse Nicotine patch DuoNeb says needed   Full code SCDs for DVT prophylaxis   CROSLEY, DEBBY 10/01/2012, 2:19 AM

## 2012-10-01 NOTE — ED Provider Notes (Signed)
Care assumed from Dr. Cathleen Fears. Patient with history of diverticulitis, new onset of bloody stools. CT scan was pending. CT scan shows diverticulitis without abscess. To be admitted hospital for IV antibiotics and monitoring of lower GI bleeding  Kalman Drape, MD 10/01/12 [phone]

## 2012-10-02 LAB — CBC
HCT: 34.7 % — ABNORMAL LOW (ref 36.0–46.0)
Hemoglobin: 11.6 g/dL — ABNORMAL LOW (ref 12.0–15.0)
MCH: 27.4 pg (ref 26.0–34.0)
MCHC: 33.4 g/dL (ref 30.0–36.0)
MCV: 82 fL (ref 78.0–100.0)
Platelets: 190 10*3/uL (ref 150–400)
RBC: 4.23 MIL/uL (ref 3.87–5.11)
RDW: 14 % (ref 11.5–15.5)
WBC: 5.2 10*3/uL (ref 4.0–10.5)

## 2012-10-02 MED ORDER — NEBIVOLOL HCL 10 MG PO TABS
10.0000 mg | ORAL_TABLET | Freq: Every day | ORAL | Status: DC
  Administered 2012-10-02 – 2012-10-03 (×2): 10 mg via ORAL
  Filled 2012-10-02 (×2): qty 1

## 2012-10-02 MED ORDER — HYDROCHLOROTHIAZIDE 12.5 MG PO CAPS
25.0000 mg | ORAL_CAPSULE | Freq: Every day | ORAL | Status: DC
  Filled 2012-10-02: qty 2

## 2012-10-02 MED ORDER — HYDROCHLOROTHIAZIDE 25 MG PO TABS
25.0000 mg | ORAL_TABLET | Freq: Every day | ORAL | Status: DC
  Administered 2012-10-02 – 2012-10-03 (×2): 25 mg via ORAL
  Filled 2012-10-02 (×2): qty 1

## 2012-10-02 MED ORDER — HYDROCHLOROTHIAZIDE 12.5 MG PO CAPS
12.5000 mg | ORAL_CAPSULE | Freq: Every day | ORAL | Status: DC
  Filled 2012-10-02: qty 1

## 2012-10-02 MED ORDER — AMLODIPINE BESYLATE 5 MG PO TABS
5.0000 mg | ORAL_TABLET | Freq: Every day | ORAL | Status: DC
  Filled 2012-10-02: qty 1

## 2012-10-02 MED ORDER — HYDRALAZINE HCL 20 MG/ML IJ SOLN
10.0000 mg | Freq: Four times a day (QID) | INTRAMUSCULAR | Status: DC | PRN
Start: 2012-10-02 — End: 2012-10-03
  Filled 2012-10-02: qty 0.5

## 2012-10-02 NOTE — Progress Notes (Signed)
Nutrition Education Note  RD consulted for nutrition education regarding Nutrition Management for Diverticulosis/Diverticulitis.   RD provided  "High Fiber Nutrition Therapy" handout from the [employer] of [employer]. Reviewed home diet with pt and suggested ways to meet nutrition goals over the next several weeks. Explained reasons to follow Low Fiber diet for the next 4-6 weeks and discussed ways to achieve.  Discussed best practice for long-term management of diverticulosis is a High Fiber diet and discussed ways to increase fiber content in an appropriate time frame. Encouraged fresh fruits and vegetables as well as whole grain sources of carbohydrates to maximize fiber intake.  Encouraged fluid intake. Discussed symptom management should abdominal pain occur while on high fiber diet. Discussed foods to avoid  Pt verbalizes understanding of information provided.  Expect good compliance.  Body mass index is 31.04 kg/(m^2). Pt meets criteria for obese class 1 based on current BMI.  Current diet order is clear liquid, patient is consuming >50% of meals at this time. Labs and medications reviewed. No further nutrition interventions warranted at this time. RD contact information provided. If additional nutrition issues arise, please re-consult RD.  Brynda Greathouse, MS RD LDN Clinical Inpatient Dietitian Pager: [phone] Weekend/After hours pager: [phone]

## 2012-10-02 NOTE — Progress Notes (Signed)
[facility] PROGRESS NOTE  [name] [id number] DOB: 1956-07-19 DOA: 09/30/2012 PCP: No primary provider on file.  Assessment/Plan: Active Problems:  GI bleed  Diverticulitis  Hypertension    GI bleed secondary to Diverticulitis/diverticulosis  Hemoglobin stable  After an initial drop to 11.3 No recurrent bleeding overnight  Type and screen  Consults Dr. Adriana Mccallum if bleeding recurs  Cipro and Flagyl  Advance diet as tolerated    Hypertension  Resume home medication , hemodynamically stable   Tobacco abuse  Nicotine patch  DuoNeb says needed   Code Status: full  Family Communication: family updated about patient's clinical progress  Disposition Plan:possible DC in am if hg stable   Brief narrative:  56 year old female with known history of diverticulosis. Her last diverticular bleed was in April of 2012, at that time she had a 2 colonoscopies. This evening at approximately 6:30 she found her somewhat grumbling and felt as though she had have a BM. She went to the bathroom and had a bloody diarrhea 7 times in a row. Shows had some nausea and vomiting but mostly retching. She became diaphoretic, very pale and dizzy. She decided to come to the ER  Consultants:  None Procedures:  None Antibiotics:  Ciprofloxacin/Flagyl HPI/Subjective:  Hemodynamically stable overnight Objective: Filed Vitals:   10/01/12 1430 10/01/12 1509 10/01/12 2151 10/02/12 0601  BP: 188/90 144/76 148/90 164/86  Pulse: 66  72 73  Temp: 98.2 F (36.8 C)  98.1 F (36.7 C) 98.5 F (36.9 C)  TempSrc: Oral     Resp: 18  18 18   Height:      Weight:      SpO2: 99%  99% 98%    Intake/Output Summary (Last 24 hours) at 10/02/12 1211 Last data filed at 10/02/12 0700  Gross per 24 hour  Intake 1368.75 ml  Output      0 ml  Net 1368.75 ml    Exam:  HENT:  Head: Atraumatic.  Nose: Nose normal.  Mouth/Throat: Oropharynx is clear and moist.  Eyes: Conjunctivae are normal. Pupils are  equal, round, and reactive to light. No scleral icterus.  Neck: Neck supple. No tracheal deviation present.  Cardiovascular: Normal rate, regular rhythm, normal heart sounds and intact distal pulses.  Pulmonary/Chest: Effort normal and breath sounds normal. No respiratory distress.  Abdominal: Soft. Normal appearance and bowel sounds are normal. She exhibits no distension. There is no tenderness.  Musculoskeletal: She exhibits no edema and no tenderness.  Neurological: She is alert. No cranial nerve deficit.    Data Reviewed: Basic Metabolic Panel:  Lab [id number] 09/30/12 2251 09/30/12 2055  NA 141 140 143  K 3.4* 3.1* 3.5  CL 108 107 108  CO2 24 22 23   GLUCOSE 98 136* 149*  BUN 20 28* 28*  CREATININE 0.95 0.98 0.96  CALCIUM 8.5 9.9 9.7  MG -- -- --  PHOS -- -- --    Liver Function Tests:  Lab 09/30/12 2055  AST 16  ALT 13  ALKPHOS 89  BILITOT 0.3  PROT 6.7  ALBUMIN 3.5   No results found for this basename: LIPASE:5,AMYLASE:5 in the last 168 hours No results found for this basename: AMMONIA:5 in the last 168 hours  CBC:  Lab 10/02/12 0605 10/01/12 1551 10/01/12 0908 09/30/12 2251 09/30/12 2055  WBC 5.2 -- -- 12.3* 13.9*  NEUTROABS -- -- -- 10.7* 12.2*  HGB 11.6* 11.8* 11.3* 13.1 13.3  HCT 34.7* 35.8* 34.8* 39.5 39.6  MCV 82.0 -- -- 82.5 82.2  PLT 190 -- -- 211 213    Cardiac Enzymes: No results found for this basename: CKTOTAL:5,CKMB:5,CKMBINDEX:5,TROPONINI:5 in the last 168 hours BNP (last 3 results) No results found for this basename: PROBNP:3 in the last 8760 hours   CBG: No results found for this basename: GLUCAP:5 in the last 168 hours  No results found for this or any previous visit (from the past 240 hour(s)).   Studies: Ct Abdomen Pelvis W Contrast  10/01/2012  *RADIOLOGY REPORT*  Clinical Data: Abdominal pain, bloody diarrhea, history of diverticulitis  CT ABDOMEN AND PELVIS WITH CONTRAST  Technique:  Multidetector CT imaging of the abdomen  and pelvis was performed following the standard protocol during bolus administration of intravenous contrast.  Contrast:  100 ml Omnipaque-300 IV  Comparison: 03/28/2011  Findings: Lung bases are clear.  Liver is notable for focal fat/altered perfusion along the falciform ligament.  Spleen and adrenal glands are within normal limits.  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  Visualized pancreas is unremarkable.  Gallbladder is underdistended.  No intrahepatic or extrahepatic ductal dilatation.  Kidneys are within normal limits.  No hydronephrosis.  No evidence of bowel obstruction.  Normal appendix.  Colonic diverticulosis.  Suspected focal wall thickening/inflammatory changes near the hepatic flexure (series 2/image 39), possibly reflecting mild diverticulitis.  No drainable fluid collection or abscess.  No free air.  Atherosclerotic calcifications of the abdominal aorta and branch vessels.  No abdominopelvic ascites.  No suspicious abdominopelvic lymphadenopathy.  Hypoenhancing lesion in the right uterine body (series 2/image 62), possibly reflecting a fibroid, unchanged.  No adnexal masses.  Bladder is within normal limits.  Laxity of the anterior abdominal wall.  Moderate fat-containing periumbilical hernia (series 2/image 49).  Degenerative changes of the visualized thoracolumbar spine.  IMPRESSION:  Focal wall thickening/inflammatory changes near the hepatic flexure, possibly reflecting mild diverticulitis.  No drainable fluid collection or abscess.  No hydronephrosis.  Given focal appearance, follow-up colonoscopy is suggested.   Original Report Authenticated By: Julian Hy, M.D.     Scheduled Meds:   . ciprofloxacin  200 mg Intravenous Q12H  . metronidazole  500 mg Intravenous Q8H  . nicotine  14 mg Transdermal Daily  . pantoprazole (PROTONIX) IV  40 mg Intravenous Q24H   Continuous Infusions:   . 0.9 % NaCl with KCl 40 mEq / L 75 mL/hr at 10/02/12 0444    Active Problems:   GI bleed  Diverticulitis  Hypertension    Time spent: 40 minutes   [facility] Pager [phone]. If 8PM-8AM, please contact night-coverage at [email or web address], password [facility] 10/02/2012, 12:11 PM  LOS: 2 days

## 2012-10-03 DIAGNOSIS — I10 Essential (primary) hypertension: Secondary | ICD-10-CM

## 2012-10-03 DIAGNOSIS — R1110 Vomiting, unspecified: Secondary | ICD-10-CM

## 2012-10-03 LAB — BASIC METABOLIC PANEL
BUN: 15 mg/dL (ref 6–23)
CO2: 25 mEq/L (ref 19–32)
Calcium: 9.4 mg/dL (ref 8.4–10.5)
Chloride: 102 mEq/L (ref 96–112)
Creatinine, Ser: 1.23 mg/dL — ABNORMAL HIGH (ref 0.50–1.10)
GFR calc Af Amer: 55 mL/min — ABNORMAL LOW (ref >90)
GFR calc non Af Amer: 48 mL/min — ABNORMAL LOW (ref >90)
Glucose, Bld: 109 mg/dL — ABNORMAL HIGH (ref 70–99)
Potassium: 3.4 mEq/L — ABNORMAL LOW (ref 3.5–5.1)
Sodium: 136 mEq/L (ref 135–145)

## 2012-10-03 LAB — CBC
HCT: 36 % (ref 36.0–46.0)
Hemoglobin: 12.1 g/dL (ref 12.0–15.0)
MCH: 27.4 pg (ref 26.0–34.0)
MCHC: 33.6 g/dL (ref 30.0–36.0)
MCV: 81.6 fL (ref 78.0–100.0)
Platelets: 215 10*3/uL (ref 150–400)
RBC: 4.41 MIL/uL (ref 3.87–5.11)
RDW: 13.9 % (ref 11.5–15.5)
WBC: 6.6 10*3/uL (ref 4.0–10.5)

## 2012-10-03 MED ORDER — AMLODIPINE BESYLATE 10 MG PO TABS: 10.0000 mg | ORAL_TABLET | Freq: Every day | ORAL | Status: DC

## 2012-10-03 MED ORDER — HYDRALAZINE HCL 25 MG PO TABS: 25.0000 mg | ORAL_TABLET | Freq: Three times a day (TID) | ORAL | Status: DC

## 2012-10-03 MED ORDER — HYDROCODONE-ACETAMINOPHEN 5-325 MG PO TABS: 1.0000 | ORAL_TABLET | Freq: Three times a day (TID) | ORAL | Status: DC | PRN

## 2012-10-03 MED ORDER — POTASSIUM CHLORIDE ER 10 MEQ PO TBCR: 10.0000 meq | EXTENDED_RELEASE_TABLET | Freq: Two times a day (BID) | ORAL | Status: DC

## 2012-10-03 MED ORDER — POTASSIUM CHLORIDE CRYS ER 20 MEQ PO TBCR
40.0000 meq | EXTENDED_RELEASE_TABLET | Freq: Once | ORAL | Status: AC
  Administered 2012-10-03: 40 meq via ORAL
  Filled 2012-10-03: qty 2

## 2012-10-03 MED ORDER — NEBIVOLOL HCL 10 MG PO TABS: 10.0000 mg | ORAL_TABLET | Freq: Every day | ORAL | Status: DC

## 2012-10-03 MED ORDER — METRONIDAZOLE 500 MG PO TABS: 500.0000 mg | ORAL_TABLET | Freq: Three times a day (TID) | ORAL | Status: DC

## 2012-10-03 MED ORDER — HYDROCHLOROTHIAZIDE 25 MG PO TABS: 25.0000 mg | ORAL_TABLET | Freq: Every day | ORAL | Status: DC

## 2012-10-03 MED ORDER — PANTOPRAZOLE SODIUM 40 MG PO TBEC: 40.0000 mg | DELAYED_RELEASE_TABLET | Freq: Every day | ORAL | Status: DC

## 2012-10-03 MED ORDER — CIPROFLOXACIN HCL 500 MG PO TABS: 500.0000 mg | ORAL_TABLET | Freq: Two times a day (BID) | ORAL | Status: DC

## 2012-10-03 NOTE — Progress Notes (Signed)
Utilization review completed. Bertha Stanfill, RN, BSN. 

## 2012-10-03 NOTE — Discharge Summary (Addendum)
[facility]                                                                                   [name], is a 56 y.o. female  DOB 07-Feb-1956  MRN [id number].  Admission date:  09/30/2012  Discharge Date:  10/03/2012  Primary MD  No primary provider on file.  Admitting Physician  Quintella Baton, MD  Admission Diagnosis  Diverticulitis [562.11] Vomiting [787.03] GI bleed [578.9] Lower GI bleed [578.9] diverticulitis bleeding  Discharge Diagnosis     Active Problems:  GI bleed  Diverticulitis  Hypertension    Past Medical History  Diagnosis Date  . Diverticular disease   . Hypertension     History reviewed. No pertinent past surgical history.   Recommendations for primary care physician for things to follow:   Please follow CBC and BMP closely   Discharge Diagnoses:   Active Problems:  GI bleed  Diverticulitis  Hypertension    Discharge Condition: Stable   Diet recommendation: See Discharge Instructions below   Consults GI   History of present illness and  Hospital Course:  See H&P, Labs, Consult and Test reports for all details in brief, patient was admitted for lower GI bleed due to diverticulitis, she was treated conservatively with bowel rest and IV Cipro and Flagyl, she was seen by GI physician Dr. Collene Mares, with conservative management patient showed good improvement with resolution of bleeding around 20 hours ago, H&H if anything has gone up in the last 24 hours, she did not require any transfusions, she is today symptom-free and eager to go home, her last BM was last night which was almost clear with 1-2 drops of old blood, she will be discharged home on 10 more days of oral antibiotics, have requested case manager to appoint her primary care physician, she will follow with her primary care physician in the next 3-4 days to get CBC and BMP checked, if she is unable to find a PCP in that timeframe she has agreed to go to a urgent care  Center to get blood work done. She will also follow with GI physician Dr. Collene Mares in one week.   Today her potassium was slightly low which has been replaced, for her hypertension prescribed appropriate medications, will request primary care physician to please follow her CBC and BMP closely.      Today   Subjective:   [name] today has no headache,no chest abdominal pain,no new weakness tingling or numbness, feels much better wants to go home today.    Objective:   Blood pressure 168/77, pulse 51, temperature 97.3 F (36.3 C), temperature source Oral, resp. rate 16, height 5\' 2"  (1.575 m), weight 68.947 kg (152 lb), SpO2 98.00%.   Intake/Output Summary (Last 24 hours) at 10/03/12 0924 Last data filed at 10/03/12 0642  Gross per 24 hour  Intake    480 ml  Output      0 ml  Net    480 ml    Exam Awake Alert, Oriented *3, No new F.N deficits, Normal affect [place].AT,PERRAL Supple Neck,No JVD, No cervical lymphadenopathy appriciated.  Symmetrical Chest wall movement, Good air movement bilaterally,  CTAB RRR,No Gallops,Rubs or new Murmurs, No Parasternal Heave +ve B.Sounds, Abd Soft, Non tender, No organomegaly appriciated, No rebound -guarding or rigidity. No Cyanosis, Clubbing or edema, No new Rash or bruise  Data Review      Ct Abdomen Pelvis W Contrast  10/01/2012  *RADIOLOGY REPORT*  Clinical Data: Abdominal pain, bloody diarrhea, history of diverticulitis  CT ABDOMEN AND PELVIS WITH CONTRAST  Technique:  Multidetector CT imaging of the abdomen and pelvis was performed following the standard protocol during bolus administration of intravenous contrast.  Contrast:  100 ml Omnipaque-300 IV  Comparison: 03/28/2011  Findings: Lung bases are clear.  Liver is notable for focal fat/altered perfusion along the falciform ligament.  Spleen and adrenal glands are within normal limits.  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  Visualized pancreas is unremarkable.  Gallbladder  is underdistended.  No intrahepatic or extrahepatic ductal dilatation.  Kidneys are within normal limits.  No hydronephrosis.  No evidence of bowel obstruction.  Normal appendix.  Colonic diverticulosis.  Suspected focal wall thickening/inflammatory changes near the hepatic flexure (series 2/image 39), possibly reflecting mild diverticulitis.  No drainable fluid collection or abscess.  No free air.  Atherosclerotic calcifications of the abdominal aorta and branch vessels.  No abdominopelvic ascites.  No suspicious abdominopelvic lymphadenopathy.  Hypoenhancing lesion in the right uterine body (series 2/image 62), possibly reflecting a fibroid, unchanged.  No adnexal masses.  Bladder is within normal limits.  Laxity of the anterior abdominal wall.  Moderate fat-containing periumbilical hernia (series 2/image 49).  Degenerative changes of the visualized thoracolumbar spine.  IMPRESSION:  Focal wall thickening/inflammatory changes near the hepatic flexure, possibly reflecting mild diverticulitis.  No drainable fluid collection or abscess.  No hydronephrosis.  Given focal appearance, follow-up colonoscopy is suggested.   Original Report Authenticated By: Julian Hy, M.D.     CBC w Diff: Lab Results  Component Value Date   WBC 6.6 10/03/2012   HGB 12.1 10/03/2012   HCT 36.0 10/03/2012   PLT 215 10/03/2012   LYMPHOPCT 10* 09/30/2012   MONOPCT 2* 09/30/2012   EOSPCT 0 09/30/2012   BASOPCT 0 09/30/2012    CMP: Lab Results  Component Value Date   NA 136 10/03/2012   K 3.4* 10/03/2012   CL 102 10/03/2012   CO2 25 10/03/2012   BUN 15 10/03/2012   CREATININE 1.23* 10/03/2012   PROT 6.7 09/30/2012   ALBUMIN 3.5 09/30/2012   BILITOT 0.3 09/30/2012   ALKPHOS 89 09/30/2012   AST 16 09/30/2012   ALT 13 09/30/2012  .   Discharge Instructions     Follow with Primary MD in 3 days   Get CBC, CMP, checked 3 days by Primary MD and again as instructed by your Primary MD.   Get Medicines reviewed  and adjusted.  Please request your Prim.MD to go over all Hospital Tests and Procedure/Radiological results at the follow up, please get all Hospital records sent to your Prim MD by signing hospital release before you go home.  Activity: As tolerated with Full fall precautions use walker/cane & assistance as needed   Diet:  Soft diet advanced as tolerated over the next 3-4 days  For Heart failure patients - Check your Weight same time everyday, if you gain over 2 pounds, or you develop in leg swelling, experience more shortness of breath or chest pain, call your Primary MD immediately. Follow Cardiac Low Salt Diet and 1.8 lit/day fluid restriction.  Disposition Home    If you experience  worsening of your admission symptoms, develop shortness of breath, life threatening emergency, suicidal or homicidal thoughts you must seek medical attention immediately by calling 911 or calling your MD immediately  if symptoms less severe.  You Must read complete instructions/literature along with all the possible adverse reactions/side effects for all the Medicines you take and that have been prescribed to you. Take any new Medicines after you have completely understood and accpet all the possible adverse reactions/side effects.   Do not drive and provide baby sitting services if your were admitted for syncope or siezures until you have seen by Primary MD or a Neurologist and advised to do so again.  Do not drive when taking Pain medications.    Do not take more than prescribed Pain, Sleep and Anxiety Medications  Special Instructions: If you have smoked or chewed Tobacco  in the last 2 yrs please stop smoking, stop any regular Alcohol  and or any Recreational drug use.  Wear Seat belts while driving.   [name] was admitted to the Hospital on 09/30/2012 and Discharged on Discharge Date 10/03/2012 and should be excused from work/school   for 14 days starting 09/30/2012 , may return to work/school  without any restrictions.  Call Lala Lund MD, [facility] [phone] with questions.  Thurnell Lose M.D on 10/03/2012,at 8:40 AM  [facility] Office  [phone]  Diverticulitis A diverticulum is a small pouch or sac on the colon. Diverticulosis is the presence of these diverticula on the colon. Diverticulitis is the irritation (inflammation) or infection of diverticula. CAUSES  The colon and its diverticula contain bacteria. If food particles block the tiny opening to a diverticulum, the bacteria inside can grow and cause an increase in pressure. This leads to infection and inflammation and is called diverticulitis. SYMPTOMS   Abdominal pain and tenderness. Usually, the pain is located on the left side of your abdomen. However, it could be located elsewhere.  Fever.  Bloating.  Feeling sick to your stomach (nausea).  Throwing up (vomiting).  Abnormal stools. DIAGNOSIS  Your caregiver will take a history and perform a physical exam. Since many things can cause abdominal pain, other tests may be necessary. Tests may include:  Blood tests.  Urine tests.  X-ray of the abdomen.  CT scan of the abdomen. Sometimes, surgery is needed to determine if diverticulitis or other conditions are causing your symptoms. TREATMENT  Most of the time, you can be treated without surgery. Treatment includes:  Resting the bowels by only having liquids for a few days. As you improve, you will need to eat a low-fiber diet.  Intravenous (IV) fluids if you are losing body fluids (dehydrated).  Antibiotic medicines that treat infections may be given.  Pain and nausea medicine, if needed.  Surgery if the inflamed diverticulum has burst. HOME CARE INSTRUCTIONS   Try a clear liquid diet (broth, tea, or water for as long as directed by your caregiver). You may then gradually begin a low-fiber diet as tolerated. A low-fiber diet is a diet with less than 10 grams of  fiber. Choose the foods below to reduce fiber in the diet:  White breads, cereals, rice, and pasta.  Cooked fruits and vegetables or soft fresh fruits and vegetables without the skin.  Ground or well-cooked tender beef, ham, veal, lamb, pork, or poultry.  Eggs and seafood.  After your diverticulitis symptoms have improved, your caregiver may put you on a high-fiber diet. A high-fiber diet includes 14 grams of fiber for  every 1000 calories consumed. For a standard 2000 calorie diet, you would need 28 grams of fiber. Follow these diet guidelines to help you increase the fiber in your diet. It is important to slowly increase the amount fiber in your diet to avoid gas, constipation, and bloating.  Choose whole-grain breads, cereals, pasta, and brown rice.  Choose fresh fruits and vegetables with the skin on. Do not overcook vegetables because the more vegetables are cooked, the more fiber is lost.  Choose more nuts, seeds, legumes, dried peas, beans, and lentils.  Look for food products that have greater than 3 grams of fiber per serving on the Nutrition Facts label.  Take all medicine as directed by your caregiver.  If your caregiver has given you a follow-up appointment, it is very important that you go. Not going could result in lasting (chronic) or permanent injury, pain, and disability. If there is any problem keeping the appointment, call to reschedule. SEEK MEDICAL CARE IF:   Your pain does not improve.  You have a hard time advancing your diet beyond clear liquids.  Your bowel movements do not return to normal. SEEK IMMEDIATE MEDICAL CARE IF:   Your pain becomes worse.  You have an oral temperature above 102 F (38.9 C), not controlled by medicine.  You have repeated vomiting.  You have bloody or black, tarry stools.  Symptoms that brought you to your caregiver become worse or are not getting better. MAKE SURE YOU:   Understand these instructions.  Will watch your  condition.  Will get help right away if you are not doing well or get worse. Document Released: 09/15/2005 Document Revised: 02/28/2012 Document Reviewed: 01/11/2011 [facility] Patient Information 2013 [place].  Follow-up Information    Follow up with as provided by case manager. Schedule an appointment as soon as possible for a visit in 3 days.      Follow up with MANN,JYOTHI, MD. Schedule an appointment as soon as possible for a visit in 1 week.   Contact information:   [address] [place] [address] [phone]            Discharge Medications     Medication List     As of 10/03/2012  9:24 AM    START taking these medications         ciprofloxacin 500 MG tablet   Commonly known as: CIPRO   Take 1 tablet (500 mg total) by mouth 2 (two) times daily.      hydrALAZINE 25 MG tablet   Commonly known as: APRESOLINE   Take 1 tablet (25 mg total) by mouth 3 (three) times daily. Do not dispense Norvasc- patient now reports allergy to it.      HYDROcodone-acetaminophen 5-325 MG per tablet   Commonly known as: NORCO/VICODIN   Take 1 tablet by mouth every 8 (eight) hours as needed.      metroNIDAZOLE 500 MG tablet   Commonly known as: FLAGYL   Take 1 tablet (500 mg total) by mouth 3 (three) times daily.      nebivolol 10 MG tablet   Commonly known as: BYSTOLIC   Take 1 tablet (10 mg total) by mouth daily.      pantoprazole 40 MG tablet   Commonly known as: PROTONIX   Take 1 tablet (40 mg total) by mouth daily.      CONTINUE taking these medications         acetaminophen 500 MG tablet   Commonly  known as: TYLENOL          Where to get your medications    These are the prescriptions that you need to pick up. We sent them to a specific pharmacy, so you will need to go there to get them.   [id number] - [place], [place] - [address]    [address] [place] [address]    Phone: [phone]        ciprofloxacin 500 MG tablet    hydrALAZINE 25 MG tablet   metroNIDAZOLE 500 MG tablet   nebivolol 10 MG tablet   pantoprazole 40 MG tablet         You may get these medications from any pharmacy.         HYDROcodone-acetaminophen 5-325 MG per tablet               Total Time in preparing paper work, data evaluation and todays exam - 35 minutes  Thurnell Lose M.D on 10/03/2012 at 9:24 AM  [facility] Office  [phone]

## 2014-07-07 ENCOUNTER — Inpatient Hospital Stay (HOSPITAL_COMMUNITY)
Admission: EM | Admit: 2014-07-07 | Discharge: 2014-07-12 | DRG: 682 | Disposition: A | Discharge status: Home / Self Care | Payer: Managed Care, Other (non HMO) | Attending: Cardiovascular Disease | Admitting: Cardiovascular Disease

## 2014-07-07 ENCOUNTER — Emergency Department (HOSPITAL_COMMUNITY): Payer: Managed Care, Other (non HMO)

## 2014-07-07 ENCOUNTER — Encounter (HOSPITAL_COMMUNITY): Payer: Self-pay | Admitting: Emergency Medicine

## 2014-07-07 DIAGNOSIS — I5032 Chronic diastolic (congestive) heart failure: Secondary | ICD-10-CM | POA: Diagnosis present

## 2014-07-07 DIAGNOSIS — E876 Hypokalemia: Secondary | ICD-10-CM | POA: Diagnosis not present

## 2014-07-07 DIAGNOSIS — I5043 Acute on chronic combined systolic (congestive) and diastolic (congestive) heart failure: Secondary | ICD-10-CM | POA: Diagnosis present

## 2014-07-07 DIAGNOSIS — I5021 Acute systolic (congestive) heart failure: Secondary | ICD-10-CM

## 2014-07-07 DIAGNOSIS — F17200 Nicotine dependence, unspecified, uncomplicated: Secondary | ICD-10-CM | POA: Diagnosis present

## 2014-07-07 DIAGNOSIS — Z9114 Patient's other noncompliance with medication regimen: Secondary | ICD-10-CM

## 2014-07-07 DIAGNOSIS — I509 Heart failure, unspecified: Secondary | ICD-10-CM | POA: Diagnosis present

## 2014-07-07 DIAGNOSIS — K5792 Diverticulitis of intestine, part unspecified, without perforation or abscess without bleeding: Secondary | ICD-10-CM | POA: Diagnosis present

## 2014-07-07 DIAGNOSIS — I10 Essential (primary) hypertension: Secondary | ICD-10-CM

## 2014-07-07 DIAGNOSIS — Z79899 Other long term (current) drug therapy: Secondary | ICD-10-CM

## 2014-07-07 DIAGNOSIS — K279 Peptic ulcer, site unspecified, unspecified as acute or chronic, without hemorrhage or perforation: Secondary | ICD-10-CM | POA: Diagnosis present

## 2014-07-07 DIAGNOSIS — I110 Hypertensive heart disease with heart failure: Secondary | ICD-10-CM | POA: Diagnosis present

## 2014-07-07 DIAGNOSIS — I130 Hypertensive heart and chronic kidney disease with heart failure and stage 1 through stage 4 chronic kidney disease, or unspecified chronic kidney disease: Secondary | ICD-10-CM | POA: Diagnosis present

## 2014-07-07 DIAGNOSIS — Z91199 Patient's noncompliance with other medical treatment and regimen due to unspecified reason: Secondary | ICD-10-CM

## 2014-07-07 DIAGNOSIS — I214 Non-ST elevation (NSTEMI) myocardial infarction: Secondary | ICD-10-CM | POA: Diagnosis present

## 2014-07-07 DIAGNOSIS — Z9119 Patient's noncompliance with other medical treatment and regimen: Secondary | ICD-10-CM

## 2014-07-07 DIAGNOSIS — Z91148 Patient's other noncompliance with medication regimen for other reason: Secondary | ICD-10-CM

## 2014-07-07 DIAGNOSIS — N039 Chronic nephritic syndrome with unspecified morphologic changes: Secondary | ICD-10-CM

## 2014-07-07 DIAGNOSIS — N183 Chronic kidney disease, stage 3 unspecified: Secondary | ICD-10-CM | POA: Diagnosis present

## 2014-07-07 DIAGNOSIS — I160 Hypertensive urgency: Secondary | ICD-10-CM | POA: Diagnosis present

## 2014-07-07 DIAGNOSIS — I248 Other forms of acute ischemic heart disease: Secondary | ICD-10-CM | POA: Diagnosis present

## 2014-07-07 DIAGNOSIS — R7989 Other specified abnormal findings of blood chemistry: Secondary | ICD-10-CM

## 2014-07-07 DIAGNOSIS — R7309 Other abnormal glucose: Secondary | ICD-10-CM | POA: Diagnosis present

## 2014-07-07 DIAGNOSIS — N289 Disorder of kidney and ureter, unspecified: Secondary | ICD-10-CM

## 2014-07-07 DIAGNOSIS — Z8249 Family history of ischemic heart disease and other diseases of the circulatory system: Secondary | ICD-10-CM

## 2014-07-07 DIAGNOSIS — N179 Acute kidney failure, unspecified: Principal | ICD-10-CM | POA: Diagnosis present

## 2014-07-07 DIAGNOSIS — R778 Other specified abnormalities of plasma proteins: Secondary | ICD-10-CM

## 2014-07-07 DIAGNOSIS — N189 Chronic kidney disease, unspecified: Secondary | ICD-10-CM | POA: Diagnosis present

## 2014-07-07 DIAGNOSIS — I5042 Chronic combined systolic (congestive) and diastolic (congestive) heart failure: Secondary | ICD-10-CM | POA: Diagnosis present

## 2014-07-07 DIAGNOSIS — I252 Old myocardial infarction: Secondary | ICD-10-CM | POA: Diagnosis present

## 2014-07-07 DIAGNOSIS — N1831 Chronic kidney disease, stage 3a: Secondary | ICD-10-CM | POA: Diagnosis present

## 2014-07-07 DIAGNOSIS — I2489 Other forms of acute ischemic heart disease: Secondary | ICD-10-CM | POA: Diagnosis present

## 2014-07-07 LAB — URINALYSIS, ROUTINE W REFLEX MICROSCOPIC
Bilirubin Urine: NEGATIVE
Glucose, UA: NEGATIVE mg/dL
Ketones, ur: NEGATIVE mg/dL
Leukocytes, UA: NEGATIVE
Nitrite: NEGATIVE
Protein, ur: NEGATIVE mg/dL
Specific Gravity, Urine: 1.009 (ref 1.005–1.030)
Urobilinogen, UA: 0.2 mg/dL (ref 0.0–1.0)
pH: 7 (ref 5.0–8.0)

## 2014-07-07 LAB — CBC
HCT: 48.2 % — ABNORMAL HIGH (ref 36.0–46.0)
Hemoglobin: 15.7 g/dL — ABNORMAL HIGH (ref 12.0–15.0)
MCH: 26.1 pg (ref 26.0–34.0)
MCHC: 32.6 g/dL (ref 30.0–36.0)
MCV: 80.2 fL (ref 78.0–100.0)
Platelets: 211 10*3/uL (ref 150–400)
RBC: 6.01 MIL/uL — ABNORMAL HIGH (ref 3.87–5.11)
RDW: 15.5 % (ref 11.5–15.5)
WBC: 8.8 10*3/uL (ref 4.0–10.5)

## 2014-07-07 LAB — BASIC METABOLIC PANEL
Anion gap: 14 (ref 5–15)
BUN: 40 mg/dL — ABNORMAL HIGH (ref 6–23)
CO2: 29 mEq/L (ref 19–32)
Calcium: 8.6 mg/dL (ref 8.4–10.5)
Chloride: 99 mEq/L (ref 96–112)
Creatinine, Ser: 1.67 mg/dL — ABNORMAL HIGH (ref 0.50–1.10)
GFR calc Af Amer: 38 mL/min — ABNORMAL LOW (ref >90)
GFR calc non Af Amer: 33 mL/min — ABNORMAL LOW (ref >90)
Glucose, Bld: 110 mg/dL — ABNORMAL HIGH (ref 70–99)
Potassium: 3.1 mEq/L — ABNORMAL LOW (ref 3.7–5.3)
Sodium: 142 mEq/L (ref 137–147)

## 2014-07-07 LAB — CREATININE, URINE, RANDOM: Creatinine, Urine: 9.04 mg/dL

## 2014-07-07 LAB — HEPARIN LEVEL (UNFRACTIONATED): Heparin Unfractionated: <0.1 IU/mL — ABNORMAL LOW (ref 0.30–0.70)

## 2014-07-07 LAB — SODIUM, URINE, RANDOM: Sodium, Ur: 112 mEq/L

## 2014-07-07 LAB — I-STAT TROPONIN, ED: Troponin i, poc: 0.18 ng/mL (ref 0.00–0.08)

## 2014-07-07 LAB — HEMOGLOBIN A1C
Hgb A1c MFr Bld: 6 % — ABNORMAL HIGH (ref <5.7)
Mean Plasma Glucose: 126 mg/dL — ABNORMAL HIGH (ref <117)

## 2014-07-07 LAB — RAPID URINE DRUG SCREEN, HOSP PERFORMED
Amphetamines: NOT DETECTED
Barbiturates: NOT DETECTED
Benzodiazepines: NOT DETECTED
Cocaine: NOT DETECTED
Opiates: NOT DETECTED
Tetrahydrocannabinol: NOT DETECTED

## 2014-07-07 LAB — PRO B NATRIURETIC PEPTIDE: Pro B Natriuretic peptide (BNP): 33636 pg/mL — ABNORMAL HIGH (ref 0–125)

## 2014-07-07 LAB — TROPONIN I
Troponin I: 0.49 ng/mL (ref <0.30)
Troponin I: 0.43 ng/mL (ref <0.30)
Troponin I: 0.37 ng/mL (ref <0.30)

## 2014-07-07 LAB — URINE MICROSCOPIC-ADD ON

## 2014-07-07 MED ORDER — SODIUM CHLORIDE 0.9 % IV SOLN
250.0000 mL | INTRAVENOUS | Status: DC | PRN
  Administered 2014-07-09: 250 mL via INTRAVENOUS

## 2014-07-07 MED ORDER — SODIUM CHLORIDE 0.9 % IJ SOLN: 3.0000 mL | INTRAMUSCULAR | Status: DC | PRN

## 2014-07-07 MED ORDER — ATORVASTATIN CALCIUM 40 MG PO TABS
40.0000 mg | ORAL_TABLET | Freq: Every day | ORAL | Status: DC
  Administered 2014-07-07 – 2014-07-11 (×5): 40 mg via ORAL
  Filled 2014-07-07 (×6): qty 1

## 2014-07-07 MED ORDER — HEPARIN BOLUS VIA INFUSION
2000.0000 [IU] | Freq: Once | INTRAVENOUS | Status: AC
  Administered 2014-07-07: 2000 [IU] via INTRAVENOUS
  Filled 2014-07-07: qty 2000

## 2014-07-07 MED ORDER — PANTOPRAZOLE SODIUM 40 MG PO TBEC
40.0000 mg | DELAYED_RELEASE_TABLET | Freq: Every day | ORAL | Status: DC
  Administered 2014-07-08 – 2014-07-12 (×5): 40 mg via ORAL
  Filled 2014-07-07 (×5): qty 1

## 2014-07-07 MED ORDER — NITROGLYCERIN 2 % TD OINT: 1.0000 [in_us] | TOPICAL_OINTMENT | Freq: Once | TRANSDERMAL | Status: DC

## 2014-07-07 MED ORDER — POTASSIUM CHLORIDE CRYS ER 20 MEQ PO TBCR
20.0000 meq | EXTENDED_RELEASE_TABLET | Freq: Every day | ORAL | Status: DC
Start: 2014-07-08 — End: 2014-07-08
  Filled 2014-07-07: qty 1

## 2014-07-07 MED ORDER — NITROGLYCERIN 2 % TD OINT
1.5000 [in_us] | TOPICAL_OINTMENT | Freq: Once | TRANSDERMAL | Status: DC
  Filled 2014-07-07: qty 1

## 2014-07-07 MED ORDER — ACETAMINOPHEN 325 MG PO TABS
650.0000 mg | ORAL_TABLET | ORAL | Status: DC | PRN
  Administered 2014-07-07 – 2014-07-10 (×6): 650 mg via ORAL
  Filled 2014-07-07 (×5): qty 2

## 2014-07-07 MED ORDER — ASPIRIN 300 MG RE SUPP: 300.0000 mg | RECTAL | Status: DC

## 2014-07-07 MED ORDER — HEPARIN (PORCINE) IN NACL 100-0.45 UNIT/ML-% IJ SOLN
950.0000 [IU]/h | INTRAMUSCULAR | Status: DC
  Administered 2014-07-07: 950 [IU]/h via INTRAVENOUS
  Administered 2014-07-07: 700 [IU]/h via INTRAVENOUS
  Administered 2014-07-08: 950 [IU]/h via INTRAVENOUS
  Filled 2014-07-07 (×3): qty 250

## 2014-07-07 MED ORDER — HEPARIN BOLUS VIA INFUSION
3000.0000 [IU] | Freq: Once | INTRAVENOUS | Status: AC
  Administered 2014-07-07: 3000 [IU] via INTRAVENOUS
  Filled 2014-07-07: qty 3000

## 2014-07-07 MED ORDER — FUROSEMIDE 10 MG/ML IJ SOLN
40.0000 mg | Freq: Once | INTRAMUSCULAR | Status: AC
  Administered 2014-07-07: 40 mg via INTRAVENOUS
  Filled 2014-07-07: qty 4

## 2014-07-07 MED ORDER — AMLODIPINE BESYLATE 10 MG PO TABS
10.0000 mg | ORAL_TABLET | Freq: Every day | ORAL | Status: DC
  Administered 2014-07-07 – 2014-07-12 (×6): 10 mg via ORAL
  Filled 2014-07-07 (×6): qty 1

## 2014-07-07 MED ORDER — ASPIRIN 81 MG PO CHEW: 324.0000 mg | CHEWABLE_TABLET | ORAL | Status: DC

## 2014-07-07 MED ORDER — ALPRAZOLAM 0.25 MG PO TABS
0.2500 mg | ORAL_TABLET | Freq: Two times a day (BID) | ORAL | Status: DC | PRN
  Administered 2014-07-08 – 2014-07-11 (×4): 0.25 mg via ORAL
  Filled 2014-07-07 (×6): qty 1

## 2014-07-07 MED ORDER — FUROSEMIDE 10 MG/ML IJ SOLN
40.0000 mg | Freq: Two times a day (BID) | INTRAMUSCULAR | Status: DC
  Administered 2014-07-08 – 2014-07-09 (×3): 40 mg via INTRAVENOUS
  Filled 2014-07-07 (×5): qty 4

## 2014-07-07 MED ORDER — NITROGLYCERIN 0.4 MG SL SUBL: 0.4000 mg | SUBLINGUAL_TABLET | SUBLINGUAL | Status: DC | PRN

## 2014-07-07 MED ORDER — POTASSIUM CHLORIDE CRYS ER 20 MEQ PO TBCR
40.0000 meq | EXTENDED_RELEASE_TABLET | Freq: Once | ORAL | Status: AC
  Administered 2014-07-07: 40 meq via ORAL

## 2014-07-07 MED ORDER — ASPIRIN EC 81 MG PO TBEC
81.0000 mg | DELAYED_RELEASE_TABLET | Freq: Every day | ORAL | Status: DC
  Administered 2014-07-08 – 2014-07-12 (×5): 81 mg via ORAL
  Filled 2014-07-07 (×5): qty 1

## 2014-07-07 MED ORDER — ASPIRIN 325 MG PO TABS
325.0000 mg | ORAL_TABLET | Freq: Once | ORAL | Status: AC
  Administered 2014-07-07: 325 mg via ORAL
  Filled 2014-07-07: qty 1

## 2014-07-07 MED ORDER — ZOLPIDEM TARTRATE 5 MG PO TABS: 5.0000 mg | ORAL_TABLET | Freq: Every evening | ORAL | Status: DC | PRN

## 2014-07-07 MED ORDER — CARVEDILOL 3.125 MG PO TABS
3.1250 mg | ORAL_TABLET | Freq: Two times a day (BID) | ORAL | Status: DC
  Administered 2014-07-07: 3.125 mg via ORAL
  Filled 2014-07-07 (×4): qty 1

## 2014-07-07 MED ORDER — NITROGLYCERIN 0.4 MG SL SUBL
0.4000 mg | SUBLINGUAL_TABLET | SUBLINGUAL | Status: AC
  Administered 2014-07-07 (×3): 0.4 mg via SUBLINGUAL
  Filled 2014-07-07: qty 1

## 2014-07-07 MED ORDER — SODIUM CHLORIDE 0.9 % IJ SOLN
3.0000 mL | Freq: Two times a day (BID) | INTRAMUSCULAR | Status: DC
  Administered 2014-07-07 – 2014-07-12 (×9): 3 mL via INTRAVENOUS

## 2014-07-07 MED ORDER — NITROGLYCERIN IN D5W 200-5 MCG/ML-% IV SOLN
5.0000 ug/min | INTRAVENOUS | Status: DC
  Administered 2014-07-07: 5 ug/min via INTRAVENOUS
  Filled 2014-07-07: qty 250

## 2014-07-07 MED ORDER — NITROGLYCERIN IN D5W 200-5 MCG/ML-% IV SOLN
33.3000 ug/min | INTRAVENOUS | Status: DC
  Administered 2014-07-07: 50 ug/min via INTRAVENOUS
  Administered 2014-07-08: 33.3 ug/min via INTRAVENOUS
  Filled 2014-07-07: qty 250

## 2014-07-07 MED ORDER — ONDANSETRON HCL 4 MG/2ML IJ SOLN: 4.0000 mg | Freq: Four times a day (QID) | INTRAMUSCULAR | Status: DC | PRN

## 2014-07-07 NOTE — ED Notes (Signed)
Abnormal labs given to Dr. Tawnya Crook

## 2014-07-07 NOTE — Progress Notes (Signed)
ANTICOAGULATION CONSULT NOTE - Follow Up Consult  Pharmacy Consult for heparin Indication: NSTEMI  Labs:  Recent Labs  07/07/14 0948 07/07/14 1030 07/07/14 1606 07/07/14 2150  HGB 15.7*  --   --   --   HCT 48.2*  --   --   --   PLT 211  --   --   --   HEPARINUNFRC  --   --   --  <0.10*  CREATININE 1.67*  --   --   --   TROPONINI  --  0.49* 0.43* 0.37*    Assessment: 58yo female undetectable on heparin with initial dosing for NSTEMI.  Goal of Therapy:  Heparin level 0.3-0.7 units/ml   Plan:  Will rebolus with heparin 2000 units and increase gtt by 4 units/kg/hr to 950 units/hr and check level in [place], PharmD, BCPS  07/07/2014,10:40 PM

## 2014-07-07 NOTE — H&P (Signed)
Patient ID: [name]  MRN: [id number], DOB/AGE: Jun 23, 1956  Admit date: 07/07/2014  Primary Physician: No primary provider on file.  Primary Cardiologist: Dr Acie Fredrickson (new)  HPI: 58 y/o married female from [place], who has worked in the [employer] for 30 years, followed in the past by Dr Pati Gallo for HTN. She says she has had "reactions" to several medications for her B/P in the past and in the last few years has stopped all her antihypertensives secondary to cost.  She has had increasing dyspnea and edema for the past several weeks as well as "panic attacks". She presented to the ER today with these complaints. In the ER her B/P 195/135, CXR shows CHF, BNP 33K, and Troponin up to 0.49. She denies chest pain.  Problem List:  Past Medical History   Diagnosis  Date   .  Diverticular disease    .  Hypertension     History reviewed. No pertinent past surgical history.  Allergies:  Allergies   Allergen  Reactions   .  Hydralazine  Swelling   .  Other  Other (See Comments)     Unknown blood pressure medication caused face swelling    Home Medications  Current Facility-Administered Medications   Medication  Dose  Route  Frequency  Provider  Last Rate  Last Dose   .  furosemide (LASIX) injection 40 mg  40 mg  Intravenous  Once  Neta Ehlers, MD     .  nitroGLYCERIN (NITROGLYN) 2 % ointment 1.5 inch  1.5 inch  Topical  Once  Neta Ehlers, MD      Current Outpatient Prescriptions   Medication  Sig  Dispense  Refill   .  acetaminophen (TYLENOL) 500 MG tablet  Take 1,000 mg by mouth every 6 (six) hours as needed. For pain     .  diphenhydrAMINE (BENADRYL) 25 mg capsule  Take 25 mg by mouth every 8 (eight) hours as needed for sleep (nausea).     .  diphenhydramine-acetaminophen (TYLENOL PM) 25-500 MG TABS  Take 1 tablet by mouth at bedtime as needed (sleep).     .  Misc Natural Products (STRESS RELEAF PO)  Take 1 tablet by mouth 2 (two) times daily.      Family History   Problem   Relation  Age of Onset   .  Coronary artery disease     .  Hypertension      History    Social History   .  Marital Status:  Married     Spouse Name:  N/A     Number of Children:  N/A   .  Years of Education:  N/A    Occupational History   .  Not on file.    Social History Main Topics   .  Smoking status:  Current Every Day Smoker   .  Smokeless tobacco:  Not on file   .  Alcohol Use:  No   .  Drug Use:  No   .  Sexual Activity:  Not on file    Other Topics  Concern   .  Not on file    Social History Narrative   .  No narrative on file    Review of Systems:  General: negative for chills, fever, night sweats or weight changes. Positive LE edema  Cardiovascular: negative for chest pain  Dermatological: negative for rash  Respiratory: negative for cough or wheezing  Urologic: negative for hematuria  Abdominal: negative for nausea, vomiting, diarrhea, bright red blood per rectum, melena, or hematemesis  Neurologic: negative for visual changes, syncope, or dizziness  All other systems reviewed and are otherwise negative except as noted above.  Physical Exam:  Blood pressure 182/122, pulse 72, temperature 97.6 F (36.4 C), temperature source Oral, resp. rate 24, height 5' (1.524 m), weight 159 lb 4.8 oz (72.258 kg), SpO2 96.00%.  General appearance: alert, cooperative, no distress, moderately obese and poor dentition  Neck: no carotid bruit and JVD noted  Lungs: rales 1/3 way up bilat  Heart: regular rate and rhythm and soft systolic murmur LSB, S4 gallop  Abdomen: obese, umbilical hernia noted  Extremities: 1+ bilat LE edema  Pulses: 2+ and symmetric  Skin: pale, cool, dry  Neurologic: Grossly normal  Labs:  Results for orders placed during the hospital encounter of 07/07/14 (from the past 24 hour(s))   PRO B NATRIURETIC PEPTIDE Status: Abnormal    Collection Time    07/07/14 9:48 AM   Result  Value  Ref Range    Pro B Natriuretic peptide (BNP)  33636.0 (*)  0 - 125  pg/mL   CBC Status: Abnormal    Collection Time    07/07/14 9:48 AM   Result  Value  Ref Range    WBC  8.8  4.0 - 10.5 K/uL    RBC  6.01 (*)  3.87 - 5.11 MIL/uL    Hemoglobin  15.7 (*)  12.0 - 15.0 g/dL    HCT  48.2 (*)  36.0 - 46.0 %    MCV  80.2  78.0 - 100.0 fL    MCH  26.1  26.0 - 34.0 pg    MCHC  32.6  30.0 - 36.0 g/dL    RDW  15.5  11.5 - 15.5 %    Platelets  211  150 - 400 K/uL   BASIC METABOLIC PANEL Status: Abnormal    Collection Time    07/07/14 9:48 AM   Result  Value  Ref Range    Sodium  142  137 - 147 mEq/L    Potassium  3.1 (*)  3.7 - 5.3 mEq/L    Chloride  99  96 - 112 mEq/L    CO2  29  19 - 32 mEq/L    Glucose, Bld  110 (*)  70 - 99 mg/dL    BUN  40 (*)  6 - 23 mg/dL    Creatinine, Ser  1.67 (*)  0.50 - 1.10 mg/dL    Calcium  8.6  8.4 - 10.5 mg/dL    GFR calc non Af Amer  33 (*)  >90 mL/min    GFR calc Af Amer  38 (*)  >90 mL/min    Anion gap  14  5 - 15   I-STAT TROPOININ, ED Status: Abnormal    Collection Time    07/07/14 10:03 AM   Result  Value  Ref Range    Troponin i, poc  0.18 (*)  0.00 - 0.08 ng/mL    Comment  NOTIFIED PHYSICIAN     Comment 3     TROPONIN I Status: Abnormal    Collection Time    07/07/14 10:30 AM   Result  Value  Ref Range    Troponin I  0.49 (*)  <0.30 ng/mL    Radiology/Studies: Dg Chest 2 View  07/07/2014 CLINICAL DATA: Chest pressure. Leg swelling. EXAM: CHEST 2 VIEW COMPARISON: None. FINDINGS: Cardiac silhouette is mildly enlarged.  No mediastinal or hilar masses. There is central vascular congestion and mild interstitial thickening with small bilateral effusions. No pneumothorax. Bony thorax is demineralized but intact. IMPRESSION: Findings consistent with mild congestive heart failure with mild cardiomegaly central vascular congestion, small effusions and mild interstitial thickening. Electronically Signed By: Lajean Manes M.D. On: 07/07/2014 12:08  EKG: NSR LVH with repol changes  ASSESSMENT AND PLAN:  Principal Problem:   Acute CHF  Active Problems:  Hypertensive urgency  NSTEMI (non-ST elevated myocardial infarction)  Hypertension  Hypertensive cardiovascular disease  Renal insufficiency- unclear if chronic or not  Diverticulitis- lower GI bleed 2013  Non compliance w medication regimen secondary to "allergies" and cost  PUD (peptic ulcer disease)- endoscopy 2012   PLAN: Seen with Dr Acie Fredrickson. Start Lasix 40 mg BID, Heparin, IV NTG, Coreg, Norvasc. Follow renal function, no ACE or ARB. Echo tomorrow.  Henri Medal, PA-C  07/07/2014, 2:44 PM     Attending Note:  The patient was seen and examined. Agree with assessment and plan as noted above. Changes made to the above note as needed.  Pt has had a long hx of HTN - uncontrolled. She stopped her medications 3 years ago and has not seen her medical doctor in 3 years.  Still smokes cigarettes.  Strong family hx of HTN and strokes.  Does not drink ETOH.  She eats a very high salt diet Works at [employer].  We discussed a low salt diet - she eats a very high salt diet.   1. Acute on chronic CHF - Echo to be done tomorrow. I suspect this is due to her prolonged uncontroled HTN. . She also has at least moderate renal insufficiency. This will limit our medication choices.  Continue lasix , Needs potassium supplement  She has had a reaction to hydralazine  Unable to give ACE-I due to renal function   2. 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 Add amlodipine as tolerated.   3. Acute on chronic renal insufficiency: Likely due to her longstandoing HTN.   4. Elevated Troponin : At this point, she has not had any chest pain and the troponin may be due to her acute CHF in the setting of renal insufficiency. Will anticipate getting a Lexiscan myoview at some point when her volume status and BP are better. IV heparin for now .     Thayer Headings, Brooke Bonito., MD, [facility]   07/07/2014, 2:59 PM

## 2014-07-07 NOTE — ED Notes (Addendum)
She states "it feels like i keep having panic attacks. My feet and legs hurt, my stomach feels swollen, i cant eat anything, i cant sleep, everything smells bad." shes A&Ox4, resp e/u. Her BP is elevated, she states she stopped taking her BP medication because her doctor wouldn't refill it without seeing her in office and she couldn't get off work for appts.

## 2014-07-07 NOTE — Progress Notes (Addendum)
Patient ID: [name] MRN: [id number], DOB/AGE: Mar 24, 1956   Admit date: 07/07/2014   Primary Physician: No primary provider on file. Primary Cardiologist: Dr Acie Fredrickson (new)  HPI: 58 y/o married female from [place], who has worked in the [employer] for 30 years, followed in the past by Dr Pati Gallo for HTN. She says she has had "reactions" to several medications for her B/P in the past and in the last few years has stopped all her antihypertensives secondary to cost.            She has had increasing dyspnea and edema for the past several weeks as well as "panic attacks". She presented to the ER today with these complaints. In the ER her B/P 195/135,  CXR shows CHF, BNP 33K, and Troponin up to 0.49. She denies chest pain.              Problem List: Past Medical History  Diagnosis Date  . Diverticular disease   . Hypertension     History reviewed. No pertinent past surgical history.   Allergies:  Allergies  Allergen Reactions  . Hydralazine Swelling  . Other Other (See Comments)    Unknown blood pressure medication caused face swelling     Home Medications Current Facility-Administered Medications  Medication Dose Route Frequency Provider Last Rate Last Dose  . furosemide (LASIX) injection 40 mg  40 mg Intravenous Once Neta Ehlers, MD      . nitroGLYCERIN (NITROGLYN) 2 % ointment 1.5 inch  1.5 inch Topical Once Neta Ehlers, MD       Current Outpatient Prescriptions  Medication Sig Dispense Refill  . acetaminophen (TYLENOL) 500 MG tablet Take 1,000 mg by mouth every 6 (six) hours as needed. For pain      . diphenhydrAMINE (BENADRYL) 25 mg capsule Take 25 mg by mouth every 8 (eight) hours as needed for sleep (nausea).      . diphenhydramine-acetaminophen (TYLENOL PM) 25-500 MG TABS Take 1 tablet by mouth at bedtime as needed (sleep).      . Misc Natural Products (STRESS RELEAF PO) Take 1 tablet by mouth 2 (two) times daily.         Family History    Problem Relation Age of Onset  . Coronary artery disease    . Hypertension       History   Social History  . Marital Status: Married    Spouse Name: N/A    Number of Children: N/A  . Years of Education: N/A   Occupational History  . Not on file.   Social History Main Topics  . Smoking status: Current Every Day Smoker  . Smokeless tobacco: Not on file  . Alcohol Use: No  . Drug Use: No  . Sexual Activity: Not on file   Other Topics Concern  . Not on file   Social History Narrative  . No narrative on file     Review of Systems: General: negative for chills, fever, night sweats or weight changes. Positive LE edema Cardiovascular: negative for chest pain Dermatological: negative for rash Respiratory: negative for cough or wheezing Urologic: negative for hematuria Abdominal: negative for nausea, vomiting, diarrhea, bright red blood per rectum, melena, or hematemesis Neurologic: negative for visual changes, syncope, or dizziness All other systems reviewed and are otherwise negative except as noted above.  Physical Exam: Blood pressure 182/122, pulse 72, temperature 97.6 F (36.4 C), temperature source Oral, resp. rate 24, height 5' (1.524 m),  weight 159 lb 4.8 oz (72.258 kg), SpO2 96.00%.  General appearance: alert, cooperative, no distress, moderately obese and poor dentition Neck: no carotid bruit and JVD noted Lungs: rales 1/3 way up bilat Heart: regular rate and rhythm and soft systolic murmur LSB, S4 gallop Abdomen: obese, umbilical hernia noted Extremities: 1+ bilat LE edema Pulses: 2+ and symmetric Skin: pale, cool, dry Neurologic: Grossly normal    Labs:   Results for orders placed during the hospital encounter of 07/07/14 (from the past 24 hour(s))  PRO B NATRIURETIC PEPTIDE     Status: Abnormal   Collection Time    07/07/14  9:48 AM      Result Value Ref Range   Pro B Natriuretic peptide (BNP) 33636.0 (*) 0 - 125 pg/mL  CBC     Status: Abnormal    Collection Time    07/07/14  9:48 AM      Result Value Ref Range   WBC 8.8  4.0 - 10.5 K/uL   RBC 6.01 (*) 3.87 - 5.11 MIL/uL   Hemoglobin 15.7 (*) 12.0 - 15.0 g/dL   HCT 48.2 (*) 36.0 - 46.0 %   MCV 80.2  78.0 - 100.0 fL   MCH 26.1  26.0 - 34.0 pg   MCHC 32.6  30.0 - 36.0 g/dL   RDW 15.5  11.5 - 15.5 %   Platelets 211  150 - 400 K/uL  BASIC METABOLIC PANEL     Status: Abnormal   Collection Time    07/07/14  9:48 AM      Result Value Ref Range   Sodium 142  137 - 147 mEq/L   Potassium 3.1 (*) 3.7 - 5.3 mEq/L   Chloride 99  96 - 112 mEq/L   CO2 29  19 - 32 mEq/L   Glucose, Bld 110 (*) 70 - 99 mg/dL   BUN 40 (*) 6 - 23 mg/dL   Creatinine, Ser 1.67 (*) 0.50 - 1.10 mg/dL   Calcium 8.6  8.4 - 10.5 mg/dL   GFR calc non Af Amer 33 (*) >90 mL/min   GFR calc Af Amer 38 (*) >90 mL/min   Anion gap 14  5 - 15  I-STAT TROPOININ, ED     Status: Abnormal   Collection Time    07/07/14 10:03 AM      Result Value Ref Range   Troponin i, poc 0.18 (*) 0.00 - 0.08 ng/mL   Comment NOTIFIED PHYSICIAN     Comment 3           TROPONIN I     Status: Abnormal   Collection Time    07/07/14 10:30 AM      Result Value Ref Range   Troponin I 0.49 (*) <0.30 ng/mL     Radiology/Studies: Dg Chest 2 View  07/07/2014   CLINICAL DATA:  Chest pressure.  Leg swelling.  EXAM: CHEST  2 VIEW  COMPARISON:  None.  FINDINGS: Cardiac silhouette is mildly enlarged. No mediastinal or hilar masses. There is central vascular congestion and mild interstitial thickening with small bilateral effusions. No pneumothorax.  Bony thorax is demineralized but intact.  IMPRESSION: Findings consistent with mild congestive heart failure with mild cardiomegaly central vascular congestion, small effusions and mild interstitial thickening.   Electronically Signed   By: Lajean Manes M.D.   On: 07/07/2014 12:08    EKG: NSR LVH with repol changes  ASSESSMENT AND PLAN:  Principal Problem:   Acute CHF Active Problems:   Hypertensive  urgency   NSTEMI (non-ST elevated myocardial infarction)   Hypertension   Hypertensive cardiovascular disease   Renal insufficiency- unclear if chronic or not   Diverticulitis- lower GI bleed 2013   Non compliance w medication regimen secondary to "allergies" and cost   PUD (peptic ulcer disease)- endoscopy 2012   PLAN:  Seen with Dr Acie Fredrickson. Start Lasix 40 mg BID, Heparin, IV NTG, Coreg, Norvasc. Follow renal function, no ACE or ARB.  Echo tomorrow.    Henri Medal, PA-C 07/07/2014, 2:44 PM  Attending Note:   The patient was seen and examined.  Agree with assessment and plan as noted above.  Changes made to the above note as needed.  Pt has had a long hx of HTN - uncontrolled.  She stopped her medications 3 years ago and has not seen her medical doctor in 3 years.  Still smokes cigarettes. Strong family hx of HTN and strokes. Does not drink ETOH.   She eats a very high salt diet  Works at [employer]. We discussed a low salt diet  1. Acute on chronic CHF -  Echo to be done tomorrow.  I suspect this is due to her prolonged uncontroled HTN.  .  She also has at least moderate renal insufficiency.   This will limit our medication choices.    Continue lasix ,  Needs potassium supplement  She has had a reaction to hydralazine Unable to give ACE-I due to renal function  2.  Malignant HTN:  She has been untreated for years Will need to use generic meds - she will not take others Continue Lasix,  Add coreg.  Add amlodipine as tolerated.   3. Acute on chronic renal insufficiency:  Likely due to her longstandoing HTN.    4. Elevated Troponin :  At this point, she has not had any chest pain and the troponin may be due to her acute CHF in the setting of renal insufficiency.  Will anticipate getting a Lexiscan myoview at some point when her volume status and BP are better.   IV heparin for now .   Thayer Headings, Brooke Bonito., MD, [facility] 07/07/2014, 2:59 PM

## 2014-07-07 NOTE — ED Notes (Signed)
MD AWARE OF CRITICAL LAB

## 2014-07-07 NOTE — Progress Notes (Signed)
ANTICOAGULATION CONSULT NOTE - Initial Consult  Pharmacy Consult:  Heparin Indication: chest pain/ACS  Allergies  Allergen Reactions  . Hydralazine Swelling  . Other Other (See Comments)    Unknown blood pressure medication caused face swelling    Patient Measurements: Height: 5' (152.4 cm) Weight: 159 lb 4.8 oz (72.258 kg) IBW/kg (Calculated) : 45.5 Heparin Dosing Weight: 61 kg  Vital Signs: Temp: 97.6 F (36.4 C) (07/19 0920) Temp src: Oral (07/19 0920) BP: 200/137 mmHg (07/19 1445) Pulse Rate: 80 (07/19 1445)  Labs:  Recent Labs  07/07/14 0948 07/07/14 1030  HGB 15.7*  --   HCT 48.2*  --   PLT 211  --   CREATININE 1.67*  --   TROPONINI  --  0.49*    Estimated Creatinine Clearance: 32.2 ml/min (by C-G formula based on Cr of 1.67).   Medical History: Past Medical History  Diagnosis Date  . Diverticular disease   . Hypertension       Assessment: 1 YOF to start IV heparin for ACS.  Baseline labs reviewed.   Goal of Therapy:  Heparin level 0.3-0.7 units/ml Monitor platelets by anticoagulation protocol: Yes    Plan:  - Heparin 3000 units IV bolus x 1, then - Heparin gtt at 700 units/hr - Check 6 hr HL - Daily HL / CBC    Thuy D. Mina Marble, PharmD, BCPS Pager:  [phone] 07/07/2014, 3:15 PM

## 2014-07-07 NOTE — ED Provider Notes (Signed)
CSN: [id number]     Arrival date & time 07/07/14  0914 History   First MD Initiated Contact with Patient 07/07/14 [phone]     Chief Complaint  Patient presents with  . Hypertension     (Consider location/radiation/quality/duration/timing/severity/associated sxs/prior Treatment) HPI Comments: Pt states she has hx of porrly controlled BP, needing 3 meds. She has not taken BP meds in 3 years because she says he doctor "wouldn't work with me with the cost, and wouldn't refill me medications without seeing her". She has not seen PCP in 3 years. She doesn't take her BP at home. For 3-4 months she has been having episode SOB, worse w/ exertion as well as fatigue, poor appetite, poor sleep, 4 weeks of BL leg and abdominal swelling, cough. No CP, no fever.   Patient is a 58 y.o. female presenting with hypertension and shortness of breath. The history is provided by the patient, the spouse and a relative. No language interpreter was used.  Hypertension This is a chronic problem. The current episode started more than 1 week ago. The problem occurs constantly. The problem has not changed since onset.Associated symptoms include shortness of breath. Pertinent negatives include no chest pain, no abdominal pain and no headaches. Nothing aggravates the symptoms. Nothing relieves the symptoms. She has tried nothing (tried multiple drugs but was unable to control. has not taken any BP meds for 3 years) for the symptoms. The treatment provided no relief.  Shortness of Breath Severity:  Moderate Onset quality:  Unable to specify Duration:  4 months Timing:  Intermittent Progression:  Waxing and waning Chronicity:  Chronic Context: activity   Relieved by:  Rest Worsened by:  Exertion and movement Associated symptoms: cough and wheezing   Associated symptoms: no abdominal pain, no chest pain, no diaphoresis, no fever, no headaches, no neck pain, no sore throat, no sputum production and no vomiting     Past  Medical History  Diagnosis Date  . Diverticular disease   . Hypertension    History reviewed. No pertinent past surgical history. Family History  Problem Relation Age of Onset  . Coronary artery disease    . Hypertension     History  Substance Use Topics  . Smoking status: Current Every Day Smoker  . Smokeless tobacco: Not on file  . Alcohol Use: No   OB History   Grav Para Term Preterm Abortions TAB SAB Ect Mult Living                 Review of Systems  Constitutional: Negative for fever, chills, diaphoresis, activity change, appetite change and fatigue.  HENT: Negative for congestion, facial swelling, rhinorrhea and sore throat.   Eyes: Negative for photophobia and discharge.  Respiratory: Positive for cough, shortness of breath and wheezing. Negative for sputum production and chest tightness.   Cardiovascular: Positive for leg swelling. Negative for chest pain and palpitations.  Gastrointestinal: Positive for abdominal distention. Negative for nausea, vomiting, abdominal pain and diarrhea.  Endocrine: Negative for polydipsia and polyuria.  Genitourinary: Negative for dysuria, frequency, difficulty urinating and pelvic pain.  Musculoskeletal: Negative for arthralgias, back pain, neck pain and neck stiffness.  Skin: Negative for color change and wound.  Allergic/Immunologic: Negative for immunocompromised state.  Neurological: Negative for facial asymmetry, weakness, numbness and headaches.  Hematological: Does not bruise/bleed easily.  Psychiatric/Behavioral: Negative for confusion and agitation.      Allergies  Hydralazine and Other  Home Medications   Prior to Admission medications   Medication  Sig Start Date End Date Taking? Authorizing Provider  acetaminophen (TYLENOL) 500 MG tablet Take 1,000 mg by mouth every 6 (six) hours as needed. For pain   Yes Historical Provider, MD  diphenhydrAMINE (BENADRYL) 25 mg capsule Take 25 mg by mouth every 8 (eight) hours as  needed for sleep (nausea).   Yes Historical Provider, MD  diphenhydramine-acetaminophen (TYLENOL PM) 25-500 MG TABS Take 1 tablet by mouth at bedtime as needed (sleep).   Yes Historical Provider, MD  Misc Natural Products (STRESS RELEAF PO) Take 1 tablet by mouth 2 (two) times daily.   Yes Historical Provider, MD   BP 192/128  Pulse 80  Temp(Src) 98.7 F (37.1 C) (Oral)  Resp 22  Ht 4\' 10"  (1.473 m)  Wt 157 lb 1.6 oz (71.26 kg)  BMI 32.84 kg/m2  SpO2 100% Physical Exam  Constitutional: She is oriented to person, place, and time. She appears well-developed and well-nourished. No distress.  HENT:  Head: Normocephalic and atraumatic.  Mouth/Throat: No oropharyngeal exudate.  Eyes: Pupils are equal, round, and reactive to light.  Neck: Normal range of motion. Neck supple.  Cardiovascular: Normal rate, regular rhythm and normal heart sounds.  Exam reveals no gallop and no friction rub.   No murmur heard. Pulmonary/Chest: Effort normal. No respiratory distress. She has no wheezes. She has rales in the right lower field and the left lower field.  Abdominal: Soft. Bowel sounds are normal. She exhibits distension. She exhibits no mass. There is no tenderness. There is no rebound and no guarding.  Musculoskeletal: Normal range of motion. She exhibits edema (3+ BL pitting edema). She exhibits no tenderness.  Neurological: She is alert and oriented to person, place, and time.  Skin: Skin is warm and dry.  Psychiatric: She has a normal mood and affect.    ED Course  Procedures (including critical care time) Labs Review Labs Reviewed  PRO B NATRIURETIC PEPTIDE - Abnormal; Notable for the following:    Pro B Natriuretic peptide (BNP) 33636.0 (*)    All other components within normal limits  CBC - Abnormal; Notable for the following:    RBC 6.01 (*)    Hemoglobin 15.7 (*)    HCT 48.2 (*)    All other components within normal limits  BASIC METABOLIC PANEL - Abnormal; Notable for the  following:    Potassium 3.1 (*)    Glucose, Bld 110 (*)    BUN 40 (*)    Creatinine, Ser 1.67 (*)    GFR calc non Af Amer 33 (*)    GFR calc Af Amer 38 (*)    All other components within normal limits  TROPONIN I - Abnormal; Notable for the following:    Troponin I 0.49 (*)    All other components within normal limits  TROPONIN I - Abnormal; Notable for the following:    Troponin I 0.43 (*)    All other components within normal limits  I-STAT TROPOININ, ED - Abnormal; Notable for the following:    Troponin i, poc 0.18 (*)    All other components within normal limits  URINALYSIS, ROUTINE W REFLEX MICROSCOPIC  URINE RAPID DRUG SCREEN (HOSP PERFORMED)  SODIUM, URINE, RANDOM  CREATININE, URINE, RANDOM  TROPONIN I  TROPONIN I  HEMOGLOBIN A1C  HEPARIN LEVEL (UNFRACTIONATED)  TSH  BASIC METABOLIC PANEL  CBC  PROTIME-INR  LIPID PANEL  HEPARIN LEVEL (UNFRACTIONATED)    Imaging Review Dg Chest 2 View  07/07/2014   CLINICAL DATA:  Chest pressure.  Leg swelling.  EXAM: CHEST  2 VIEW  COMPARISON:  None.  FINDINGS: Cardiac silhouette is mildly enlarged. No mediastinal or hilar masses. There is central vascular congestion and mild interstitial thickening with small bilateral effusions. No pneumothorax.  Bony thorax is demineralized but intact.  IMPRESSION: Findings consistent with mild congestive heart failure with mild cardiomegaly central vascular congestion, small effusions and mild interstitial thickening.   Electronically Signed   By: Lajean Manes M.D.   On: 07/07/2014 12:08     EKG Interpretation   Date/Time:  Sunday July 07 2014 09:27:18 EDT Ventricular Rate:  82 PR Interval:  160 QRS Duration: 98 QT Interval:  424 QTC Calculation: 495 R Axis:   17 Text Interpretation:  Normal sinus rhythm Possible Left atrial enlargement  Left ventricular hypertrophy T wave abnormality, consider lateral ischemia  Prolonged QT Abnormal ECG No significant change was found Confirmed by   DOCHERTY  MD, MEGAN ([id number]) on 07/07/2014 1:03:04 PM      MDM   Final diagnoses:  Acute congestive heart failure, unspecified congestive heart failure type  Acute kidney injury  Elevated troponin  Hypertensive urgency    Pt is a 58 y.o. female with Pmhx as above who presents with intermittent SOB for 3-4 months with assoc diaphoresis, 3-4 weeks of BL leg swelling, abdominal distension, insomnia, malaise and change in taste. Denies CP, fevers. She has not taken BP meds or seen a doctor in 3 years. On PE, she is hypertensive. Lungs w/ crackles at bases, 3+ BLLE pitting edema.   EKG w/ nonspecific changes, istat trop and lab trop, BNP >33K, CXR w/ evidence of CHF. Cr elevated from baseline. Total of 80iv lasix, 3 SL NTG and 1/5 inch nitro paste placed. Cardiology will admit. Suspect cause of multiorgan dysfunction is pt's severe uncontrolled HTN.      Neta Ehlers, MD 07/07/14 [phone]

## 2014-07-07 NOTE — Progress Notes (Signed)
Utilization review completed.  

## 2014-07-08 DIAGNOSIS — I369 Nonrheumatic tricuspid valve disorder, unspecified: Secondary | ICD-10-CM

## 2014-07-08 LAB — BASIC METABOLIC PANEL
Anion gap: 12 (ref 5–15)
BUN: 39 mg/dL — ABNORMAL HIGH (ref 6–23)
CO2: 29 mEq/L (ref 19–32)
Calcium: 8 mg/dL — ABNORMAL LOW (ref 8.4–10.5)
Chloride: 100 mEq/L (ref 96–112)
Creatinine, Ser: 1.64 mg/dL — ABNORMAL HIGH (ref 0.50–1.10)
GFR calc Af Amer: 39 mL/min — ABNORMAL LOW (ref >90)
GFR calc non Af Amer: 33 mL/min — ABNORMAL LOW (ref >90)
Glucose, Bld: 103 mg/dL — ABNORMAL HIGH (ref 70–99)
Potassium: 3 mEq/L — ABNORMAL LOW (ref 3.7–5.3)
Sodium: 141 mEq/L (ref 137–147)

## 2014-07-08 LAB — CBC
HCT: 42.2 % (ref 36.0–46.0)
Hemoglobin: 13.5 g/dL (ref 12.0–15.0)
MCH: 26.1 pg (ref 26.0–34.0)
MCHC: 32 g/dL (ref 30.0–36.0)
MCV: 81.5 fL (ref 78.0–100.0)
Platelets: 188 10*3/uL (ref 150–400)
RBC: 5.18 MIL/uL — ABNORMAL HIGH (ref 3.87–5.11)
RDW: 15.4 % (ref 11.5–15.5)
WBC: 7 10*3/uL (ref 4.0–10.5)

## 2014-07-08 LAB — HEPARIN LEVEL (UNFRACTIONATED)
Heparin Unfractionated: 0.34 IU/mL (ref 0.30–0.70)
Heparin Unfractionated: 0.22 IU/mL — ABNORMAL LOW (ref 0.30–0.70)

## 2014-07-08 LAB — LIPID PANEL
Cholesterol: 158 mg/dL (ref 0–200)
HDL: 43 mg/dL (ref >39)
LDL Cholesterol: 96 mg/dL (ref 0–99)
Total CHOL/HDL Ratio: 3.7 RATIO
Triglycerides: 97 mg/dL (ref <150)
VLDL: 19 mg/dL (ref 0–40)

## 2014-07-08 LAB — PROTIME-INR
INR: 1.05 (ref 0.00–1.49)
Prothrombin Time: 13.7 seconds (ref 11.6–15.2)

## 2014-07-08 LAB — TSH: TSH: 4.87 u[IU]/mL — ABNORMAL HIGH (ref 0.350–4.500)

## 2014-07-08 LAB — TROPONIN I: Troponin I: 0.36 ng/mL (ref <0.30)

## 2014-07-08 MED ORDER — POTASSIUM CHLORIDE CRYS ER 20 MEQ PO TBCR
40.0000 meq | EXTENDED_RELEASE_TABLET | Freq: Once | ORAL | Status: AC
  Administered 2014-07-08: 40 meq via ORAL
  Filled 2014-07-08: qty 2

## 2014-07-08 MED ORDER — HEPARIN (PORCINE) IN NACL 100-0.45 UNIT/ML-% IJ SOLN
1050.0000 [IU]/h | INTRAMUSCULAR | Status: DC
  Administered 2014-07-09: 1050 [IU]/h via INTRAVENOUS
  Filled 2014-07-08 (×5): qty 250

## 2014-07-08 MED ORDER — HEPARIN BOLUS VIA INFUSION
1000.0000 [IU] | Freq: Once | INTRAVENOUS | Status: AC
  Administered 2014-07-08: 1000 [IU] via INTRAVENOUS
  Filled 2014-07-08: qty 1000

## 2014-07-08 MED ORDER — POTASSIUM CHLORIDE CRYS ER 20 MEQ PO TBCR
20.0000 meq | EXTENDED_RELEASE_TABLET | Freq: Two times a day (BID) | ORAL | Status: DC
  Administered 2014-07-08 – 2014-07-12 (×9): 20 meq via ORAL
  Filled 2014-07-08 (×8): qty 1

## 2014-07-08 MED ORDER — CARVEDILOL 6.25 MG PO TABS
6.2500 mg | ORAL_TABLET | Freq: Two times a day (BID) | ORAL | Status: DC
  Administered 2014-07-08 – 2014-07-09 (×2): 6.25 mg via ORAL
  Filled 2014-07-08 (×4): qty 1

## 2014-07-08 NOTE — Progress Notes (Signed)
  Echocardiogram 2D Echocardiogram has been performed.  [name] 07/08/2014, 10:44 AM

## 2014-07-08 NOTE — Progress Notes (Signed)
Subjective: HA this morning.    Objective: Vital signs in last 24 hours: Temp:  [97.3 F (36.3 C)-98.7 F (37.1 C)] 97.3 F (36.3 C) (07/20 0420) Pulse Rate:  [62-84] 62 (07/20 0420) Resp:  [16-33] 18 (07/20 0420) BP: (141-207)/(86-138) 141/86 mmHg (07/20 0420) SpO2:  [96 %-100 %] 98 % (07/20 0420) Weight:  [153 lb 11.2 oz (69.718 kg)-159 lb 4.8 oz (72.258 kg)] 153 lb 11.2 oz (69.718 kg) (07/20 0608) Last BM Date: 07/07/14  Intake/Output from previous day: 07/19 0701 - 07/20 0700 In: 338.3 [P.O.:240; I.V.:98.3] Out: 2625 [Urine:2625] Intake/Output this shift:    Medications Current Facility-Administered Medications  Medication Dose Route Frequency Provider Last Rate Last Dose  . 0.9 %  sodium chloride infusion  250 mL Intravenous PRN Erlene Quan, PA-C      . acetaminophen (TYLENOL) tablet 650 mg  650 mg Oral Q4H PRN Erlene Quan, PA-C   650 mg at 07/08/14 [id number]  . ALPRAZolam Duanne Moron) tablet 0.25 mg  0.25 mg Oral BID PRN Erlene Quan, PA-C   0.25 mg at 07/08/14 0119  . amLODipine (NORVASC) tablet 10 mg  10 mg Oral Daily Erlene Quan, PA-C   10 mg at 07/07/14 2254  . aspirin EC tablet 81 mg  81 mg Oral Daily Luke K Kilroy, PA-C      . atorvastatin (LIPITOR) tablet 40 mg  40 mg Oral [address] [place], PA-C   40 mg at 07/07/14 1639  . carvedilol (COREG) tablet 3.125 mg  3.125 mg Oral BID WC Doreene Burke Kilroy, PA-C   3.125 mg at 07/07/14 1639  . furosemide (LASIX) injection 40 mg  40 mg Intravenous BID Doreene Burke Kilroy, PA-C      . heparin ADULT infusion 100 units/mL (25000 units/250 mL)  950 Units/hr Intravenous Continuous Rogue Bussing, RPH 9.5 mL/hr at 07/07/14 2255 950 Units/hr at 07/07/14 2255  . nitroGLYCERIN (NITROSTAT) SL tablet 0.4 mg  0.4 mg Sublingual Q5 Min x 3 PRN Doreene Burke Kilroy, PA-C      . nitroGLYCERIN 0.2 mg/mL in dextrose 5 % infusion  50 mcg/min Intravenous Continuous Jules Husbands, MD 15 mL/hr at 07/07/14 2336 50 mcg/min at 07/07/14 2336  . ondansetron  (ZOFRAN) injection 4 mg  4 mg Intravenous Q6H PRN Doreene Burke Kilroy, PA-C      . pantoprazole (PROTONIX) EC tablet 40 mg  40 mg Oral Q0600 Erlene Quan, PA-C   40 mg at 07/08/14 0548  . potassium chloride SA (K-DUR,KLOR-CON) CR tablet 20 mEq  20 mEq Oral Daily Luke K Kilroy, PA-C      . sodium chloride 0.9 % injection 3 mL  3 mL Intravenous Q12H Erlene Quan, PA-C   3 mL at 07/07/14 2256  . sodium chloride 0.9 % injection 3 mL  3 mL Intravenous PRN Luke K Kilroy, PA-C      . zolpidem (AMBIEN) tablet 5 mg  5 mg Oral QHS PRN Erlene Quan, PA-C        PE: General appearance: alert, cooperative and no distress Lungs: Decreased BS at bases.  Mild rales Heart: regular rate and rhythm, S1, S2 normal, no murmur, click, rub or gallop Abdomen: +BS, distended, nontender Extremities: 2+ LEE Pulses: 2+ and symmetric Skin: Warm and dry Neurologic: Grossly normal  Lab Results:   Recent Labs  07/07/14 0948 07/08/14 0300  WBC 8.8 7.0  HGB 15.7* 13.5  HCT 48.2* 42.2  PLT 211 188  BMET  Recent Labs  07/07/14 0948 07/08/14 0300  NA 142 141  K 3.1* 3.0*  CL 99 100  CO2 29 29  GLUCOSE 110* 103*  BUN 40* 39*  CREATININE 1.67* 1.64*  CALCIUM 8.6 8.0*   PT/INR  Recent Labs  07/08/14 0300  LABPROT 13.7  INR 1.05   Cholesterol  Recent Labs  07/08/14 0300  CHOL 158   Cardiac Enzymes No components found with this basename: TROPONIN,  CK[facility],   Studies/Results: @RISRSLT2 @   Assessment/Plan   1. Acute on chronic CHF  Net fluids: -2.3L.  SCr stable 1.64.  Echo to be done today. She has had a reaction to hydralazine Unable to give ACE-I due to renal function.  Weaning off NTG due to HA and Unit protocols.?   Can add oral nitrates for afterload.  Diuresing well.  Continue current dosing.  She has a ways to go still.     2. Malignant HTN:  She has been untreated for years.   Will need to use generic meds - she will not take others. BP a lot better Continue Lasix, coreg 3.125mg .  Increase to 6.25.  Amlodipine 10mg .  Weaning off NTG drip.     3. Acute on chronic renal insufficiency: Likely due to her longstandoing HTN.  4. Elevated Troponin  5. Hypokalemia  Given this morning.  Change to BID dosing.  6. Prediabetes  A1C 6.0.  Needs lifestyle change.    LOS: 1 day   History and all data above reviewed.  Patient examined.  I agree with the findings as above.  The patient exam reveals COR:RRR  ,  Lungs: Basilar crackles  ,  Abd: Positive bowel sounds, no rebound no guarding, Ext Mild edema  .  All available labs, radiology testing, previous records reviewed. Agree with documented assessment and plan. BP is better controlled.  Echo done this AM and global hypokinesis with severely reduced EF of 20%.  Likely related to HTN.  However, given this and her mild troponin elevation, cath will be indicated this admission if her renal function will allow.  She will at least need a noninvasive evaluation.  Discussed with the patient.  We are going to need to explore the reaction she had to various meds.  If these were not true allergies (such as her reaction to hydralazine) she will need to be on these meds.   Jeneen Rinks Hochrein  10:46 AM  07/08/2014  Tarri Fuller PA-C 07/08/2014 8:17 AM

## 2014-07-08 NOTE — Progress Notes (Signed)
ANTICOAGULATION CONSULT NOTE - Follow Up Consult  Pharmacy Consult for heparin Indication: ACS  Labs:  Recent Labs  07/07/14 0948  07/07/14 1606 07/07/14 2150 07/08/14 0300 07/08/14 0735 07/08/14 1820  HGB 15.7*  --   --   --  13.5  --   --   HCT 48.2*  --   --   --  42.2  --   --   PLT 211  --   --   --  188  --   --   LABPROT  --   --   --   --  13.7  --   --   INR  --   --   --   --  1.05  --   --   HEPARINUNFRC  --   --   --  <0.10*  --  0.34 0.22*  CREATININE 1.67*  --   --   --  1.64*  --   --   TROPONINI  --   < > 0.43* 0.37* 0.36*  --   --   < > = values in this interval not displayed.  Assessment: 62 YOF admitted with CC of increasing dyspnea and edema. Pharmacy was consulted to start IV heparin for ACS. A confirmatory heparin today is 0.22 and below goal.   Goal of Therapy:  Heparin level 0.3-0.7 units/ml   Plan: HL= 0.22 -Heparin bolus 1000 units IV then increase infusion to 1100 units/hr -Heparin level and CBC daily  Hildred Laser, Pharm D 07/08/2014 7:08 PM

## 2014-07-08 NOTE — Progress Notes (Signed)
ANTICOAGULATION CONSULT NOTE - Follow Up Consult  Pharmacy Consult for heparin Indication: ACS  Labs:  Recent Labs  07/07/14 0948  07/07/14 1606 07/07/14 2150 07/08/14 0300 07/08/14 0735  HGB 15.7*  --   --   --  13.5  --   HCT 48.2*  --   --   --  42.2  --   PLT 211  --   --   --  188  --   LABPROT  --   --   --   --  13.7  --   INR  --   --   --   --  1.05  --   HEPARINUNFRC  --   --   --  <0.10*  --  0.34  CREATININE 1.67*  --   --   --  1.64*  --   TROPONINI  --   < > 0.43* 0.37* 0.36*  --   < > = values in this interval not displayed.  Assessment: 19 YOF admitted with CC of increasing dyspnea and edema. Pharmacy was consulted to start IV heparin for ACS. Heparin level this AM is therapeutic at 0.34. No s/s of bleeding noted. Hg 13.5, plt 188.  Goal of Therapy:  Heparin level 0.3-0.7 units/ml   Plan:  - Continue IV heparin 950units/hr - Obtain heparin level at 1700 to verify - Monitor daily CBC, heparin level, and s/s of bleeding  Roderic Palau A. Pincus Badder, PharmD Clinical Pharmacist Pager: [phone] Pharmacy: [phone] 07/08/2014 9:11 AM

## 2014-07-09 LAB — BASIC METABOLIC PANEL
Anion gap: 12 (ref 5–15)
BUN: 35 mg/dL — ABNORMAL HIGH (ref 6–23)
CO2: 30 mEq/L (ref 19–32)
Calcium: 8.1 mg/dL — ABNORMAL LOW (ref 8.4–10.5)
Chloride: 99 mEq/L (ref 96–112)
Creatinine, Ser: 1.93 mg/dL — ABNORMAL HIGH (ref 0.50–1.10)
GFR calc Af Amer: 32 mL/min — ABNORMAL LOW (ref >90)
GFR calc non Af Amer: 27 mL/min — ABNORMAL LOW (ref >90)
Glucose, Bld: 133 mg/dL — ABNORMAL HIGH (ref 70–99)
Potassium: 3.3 mEq/L — ABNORMAL LOW (ref 3.7–5.3)
Sodium: 141 mEq/L (ref 137–147)

## 2014-07-09 LAB — HEPARIN LEVEL (UNFRACTIONATED)
Heparin Unfractionated: 0.72 IU/mL — ABNORMAL HIGH (ref 0.30–0.70)
Heparin Unfractionated: 0.65 IU/mL (ref 0.30–0.70)

## 2014-07-09 LAB — CBC
HCT: 43 % (ref 36.0–46.0)
Hemoglobin: 13.7 g/dL (ref 12.0–15.0)
MCH: 26.2 pg (ref 26.0–34.0)
MCHC: 31.9 g/dL (ref 30.0–36.0)
MCV: 82.4 fL (ref 78.0–100.0)
Platelets: 171 10*3/uL (ref 150–400)
RBC: 5.22 MIL/uL — ABNORMAL HIGH (ref 3.87–5.11)
RDW: 15.4 % (ref 11.5–15.5)
WBC: 8.5 10*3/uL (ref 4.0–10.5)

## 2014-07-09 MED ORDER — ISOSORBIDE MONONITRATE ER 30 MG PO TB24
30.0000 mg | ORAL_TABLET | Freq: Every day | ORAL | Status: DC
  Administered 2014-07-09: 30 mg via ORAL
  Filled 2014-07-09: qty 1

## 2014-07-09 MED ORDER — CARVEDILOL 3.125 MG PO TABS
9.3750 mg | ORAL_TABLET | Freq: Two times a day (BID) | ORAL | Status: DC
  Administered 2014-07-09 – 2014-07-10 (×2): 9.375 mg via ORAL
  Filled 2014-07-09 (×4): qty 1

## 2014-07-09 MED ORDER — POTASSIUM CHLORIDE CRYS ER 20 MEQ PO TBCR
40.0000 meq | EXTENDED_RELEASE_TABLET | Freq: Once | ORAL | Status: AC
  Administered 2014-07-09: 40 meq via ORAL
  Filled 2014-07-09: qty 2

## 2014-07-09 MED ORDER — ISOSORBIDE MONONITRATE ER 60 MG PO TB24
120.0000 mg | ORAL_TABLET | Freq: Every day | ORAL | Status: DC
  Administered 2014-07-10 – 2014-07-12 (×3): 120 mg via ORAL
  Filled 2014-07-09 (×3): qty 2

## 2014-07-09 MED ORDER — FUROSEMIDE 40 MG PO TABS
40.0000 mg | ORAL_TABLET | Freq: Two times a day (BID) | ORAL | Status: DC
  Administered 2014-07-09 – 2014-07-10 (×2): 40 mg via ORAL
  Filled 2014-07-09 (×4): qty 1

## 2014-07-09 NOTE — Progress Notes (Signed)
ANTICOAGULATION CONSULT NOTE - Follow Up Consult  Pharmacy Consult for heparin Indication: NSTEMI  Labs:  Recent Labs  07/07/14 0948  07/07/14 1606  07/07/14 2150 07/08/14 0300 07/08/14 0735 07/08/14 1820 07/09/14 0530  HGB 15.7*  --   --   --   --  13.5  --   --  13.7  HCT 48.2*  --   --   --   --  42.2  --   --  43.0  PLT 211  --   --   --   --  188  --   --  171  LABPROT  --   --   --   --   --  13.7  --   --   --   INR  --   --   --   --   --  1.05  --   --   --   HEPARINUNFRC  --   --   --   < > <0.10*  --  0.34 0.22* 0.72*  CREATININE 1.67*  --   --   --   --  1.64*  --   --   --   TROPONINI  --   < > 0.43*  --  0.37* 0.36*  --   --   --   < > = values in this interval not displayed.   Assessment: 58yo female now above goal on heparin after rate adjustment.  Goal of Therapy:  Heparin level 0.3-0.7 units/ml   Plan:  Will decrease gtt slightly to 1050 units/hr and check level in [place], PharmD, BCPS  07/09/2014,6:25 AM

## 2014-07-09 NOTE — Plan of Care (Signed)
Problem: Phase I Progression Outcomes Goal: Hemodynamically stable Outcome: Progressing Vital signs stable.      

## 2014-07-09 NOTE — Progress Notes (Signed)
ANTICOAGULATION CONSULT NOTE - Follow Up Consult  Pharmacy Consult for heparin Indication: NSTEMI  Labs:  Recent Labs  07/07/14 0948  07/07/14 1606 07/07/14 2150 07/08/14 0300  07/08/14 1820 07/09/14 0530 07/09/14 1300  HGB 15.7*  --   --   --  13.5  --   --  13.7  --   HCT 48.2*  --   --   --  42.2  --   --  43.0  --   PLT 211  --   --   --  188  --   --  171  --   LABPROT  --   --   --   --  13.7  --   --   --   --   INR  --   --   --   --  1.05  --   --   --   --   HEPARINUNFRC  --   --   --  <0.10*  --   < > 0.22* 0.72* 0.65  CREATININE 1.67*  --   --   --  1.64*  --   --  1.93*  --   TROPONINI  --   < > 0.43* 0.37* 0.36*  --   --   --   --   < > = values in this interval not displayed.   Assessment: 58yo female now therapeutic on heparin for ACS Lexiscan planned for AM  Goal of Therapy:  Heparin level 0.3-0.7 units/ml   Plan:  Heparin drip at 1050 units / hr Follow up AM labs  Thank you. Anette Guarneri, PharmD [phone]  07/09/2014,1:42 PM

## 2014-07-09 NOTE — Progress Notes (Signed)
SUBJECTIVE:  No chest pain.  No SOB.  Felt jittery and has a headache   PHYSICAL EXAM Filed Vitals:   07/09/14 0150 07/09/14 0438 07/09/14 0714 07/09/14 0716  BP: 157/94 157/95 169/101 169/104  Pulse: 71 68 68 69  Temp: 97.9 F (36.6 C) 97.8 F (36.6 C)    TempSrc: Oral Oral    Resp: 21 18    Height:      Weight:  146 lb 4.8 oz (66.361 kg)    SpO2: 96% 95%  95%   General:  No acute distress Lungs:  Clear Heart:  RRR Abdomen:  Positive bowel sounds, no rebound no guarding Extremities:  Mild edema  Neuro:  nonfocal   LABS: Lab Results  Component Value Date   TROPONINI 0.36* 07/08/2014   Results for orders placed during the hospital encounter of 07/07/14 (from the past 24 hour(s))  HEPARIN LEVEL (UNFRACTIONATED)     Status: Abnormal   Collection Time    07/08/14  6:20 PM      Result Value Ref Range   Heparin Unfractionated 0.22 (*) 0.30 - 0.70 IU/mL  BASIC METABOLIC PANEL     Status: Abnormal   Collection Time    07/09/14  5:30 AM      Result Value Ref Range   Sodium 141  137 - 147 mEq/L   Potassium 3.3 (*) 3.7 - 5.3 mEq/L   Chloride 99  96 - 112 mEq/L   CO2 30  19 - 32 mEq/L   Glucose, Bld 133 (*) 70 - 99 mg/dL   BUN 35 (*) 6 - 23 mg/dL   Creatinine, Ser 1.93 (*) 0.50 - 1.10 mg/dL   Calcium 8.1 (*) 8.4 - 10.5 mg/dL   GFR calc non Af Amer 27 (*) >90 mL/min   GFR calc Af Amer 32 (*) >90 mL/min   Anion gap 12  5 - 15  CBC     Status: Abnormal   Collection Time    07/09/14  5:30 AM      Result Value Ref Range   WBC 8.5  4.0 - 10.5 K/uL   RBC 5.22 (*) 3.87 - 5.11 MIL/uL   Hemoglobin 13.7  12.0 - 15.0 g/dL   HCT 43.0  36.0 - 46.0 %   MCV 82.4  78.0 - 100.0 fL   MCH 26.2  26.0 - 34.0 pg   MCHC 31.9  30.0 - 36.0 g/dL   RDW 15.4  11.5 - 15.5 %   Platelets 171  150 - 400 K/uL  HEPARIN LEVEL (UNFRACTIONATED)     Status: Abnormal   Collection Time    07/09/14  5:30 AM      Result Value Ref Range   Heparin Unfractionated 0.72 (*) 0.30 - 0.70 IU/mL     Intake/Output Summary (Last 24 hours) at 07/09/14 1010 Last data filed at 07/09/14 0656  Gross per 24 hour  Intake   1068 ml  Output   4275 ml  Net  -3207 ml    ASSESSMENT AND PLAN:  ACUTE SYSTOLIC AND DIASTOLIC HF:  The patient cannot be on an ACE inhibitor with her CKD.  She reports a true allergy to hydralazine.  I will increase Coreg and isosorbide.  Options however are limited.  I will check a Lexiscan Myoview in the AM to look for an ischemic etiology.  However, I suspect HTN as the etiology.    HTN:    BP is still elevated.    ACUTE ON  CHRONIC RI:  Creat is increased.  This is difficult because she still has excess volume.  She will keep her feet elevated.  I will change to PO diuretic  ELEVATED TROPONIN:  As above.    Jeneen Rinks [place] 07/09/2014 10:10 AM

## 2014-07-09 NOTE — Progress Notes (Signed)
Pt. C/o of not feeling well. Stated she feels shaky and has a headache. Pt. On nitro drip. On call MD notified. New orders to be written. On coming RN notified. WIll give am meds now. Wall, Katherine Roan

## 2014-07-09 NOTE — Plan of Care (Signed)
Problem: Phase III Progression Outcomes Goal: Activity at appropriate level-compared to baseline (UP IN CHAIR FOR HEMODIALYSIS)  Outcome: Progressing Tolerating ambulation in room.  Up in chair and tolerating well.

## 2014-07-10 ENCOUNTER — Encounter (HOSPITAL_COMMUNITY): Payer: Managed Care, Other (non HMO)

## 2014-07-10 ENCOUNTER — Inpatient Hospital Stay (HOSPITAL_COMMUNITY): Payer: Managed Care, Other (non HMO)

## 2014-07-10 DIAGNOSIS — R079 Chest pain, unspecified: Secondary | ICD-10-CM

## 2014-07-10 LAB — CBC
HCT: 46.7 % — ABNORMAL HIGH (ref 36.0–46.0)
Hemoglobin: 14.7 g/dL (ref 12.0–15.0)
MCH: 26.3 pg (ref 26.0–34.0)
MCHC: 31.5 g/dL (ref 30.0–36.0)
MCV: 83.5 fL (ref 78.0–100.0)
Platelets: 191 10*3/uL (ref 150–400)
RBC: 5.59 MIL/uL — ABNORMAL HIGH (ref 3.87–5.11)
RDW: 15.6 % — ABNORMAL HIGH (ref 11.5–15.5)
WBC: 8.6 10*3/uL (ref 4.0–10.5)

## 2014-07-10 LAB — BASIC METABOLIC PANEL
Anion gap: 12 (ref 5–15)
BUN: 32 mg/dL — ABNORMAL HIGH (ref 6–23)
CO2: 31 mEq/L (ref 19–32)
Calcium: 8.7 mg/dL (ref 8.4–10.5)
Chloride: 100 mEq/L (ref 96–112)
Creatinine, Ser: 2.09 mg/dL — ABNORMAL HIGH (ref 0.50–1.10)
GFR calc Af Amer: 29 mL/min — ABNORMAL LOW (ref >90)
GFR calc non Af Amer: 25 mL/min — ABNORMAL LOW (ref >90)
Glucose, Bld: 118 mg/dL — ABNORMAL HIGH (ref 70–99)
Potassium: 4.1 mEq/L (ref 3.7–5.3)
Sodium: 143 mEq/L (ref 137–147)

## 2014-07-10 LAB — HEPARIN LEVEL (UNFRACTIONATED): Heparin Unfractionated: 0.58 IU/mL (ref 0.30–0.70)

## 2014-07-10 MED ORDER — POLYETHYLENE GLYCOL 3350 17 G PO PACK
17.0000 g | PACK | Freq: Every day | ORAL | Status: DC
  Administered 2014-07-10 – 2014-07-12 (×3): 17 g via ORAL
  Filled 2014-07-10 (×3): qty 1

## 2014-07-10 MED ORDER — MAGNESIUM HYDROXIDE 400 MG/5ML PO SUSP: 30.0000 mL | Freq: Every day | ORAL | Status: DC | PRN

## 2014-07-10 MED ORDER — TECHNETIUM TC 99M SESTAMIBI GENERIC - CARDIOLITE
10.0000 | Freq: Once | INTRAVENOUS | Status: AC | PRN
  Administered 2014-07-10: 10 via INTRAVENOUS

## 2014-07-10 MED ORDER — TECHNETIUM TC 99M SESTAMIBI GENERIC - CARDIOLITE
30.0000 | Freq: Once | INTRAVENOUS | Status: AC | PRN
  Administered 2014-07-10: 30 via INTRAVENOUS

## 2014-07-10 MED ORDER — HEPARIN SODIUM (PORCINE) 5000 UNIT/ML IJ SOLN
5000.0000 [IU] | Freq: Three times a day (TID) | INTRAMUSCULAR | Status: DC
Start: 2014-07-10 — End: 2014-07-12
  Administered 2014-07-10 – 2014-07-12 (×6): 5000 [IU] via SUBCUTANEOUS
  Filled 2014-07-10 (×9): qty 1

## 2014-07-10 MED ORDER — REGADENOSON 0.4 MG/5ML IV SOLN
INTRAVENOUS | Status: AC
  Administered 2014-07-10: 0.4 mg
  Filled 2014-07-10: qty 5

## 2014-07-10 MED ORDER — ACETAMINOPHEN 325 MG PO TABS
ORAL_TABLET | ORAL | Status: AC
  Filled 2014-07-10: qty 2

## 2014-07-10 MED ORDER — METOPROLOL TARTRATE 50 MG PO TABS
50.0000 mg | ORAL_TABLET | Freq: Three times a day (TID) | ORAL | Status: DC
  Administered 2014-07-10 – 2014-07-12 (×7): 50 mg via ORAL
  Filled 2014-07-10 (×9): qty 1

## 2014-07-10 NOTE — Care Management Note (Signed)
    Page 1 of 1   07/10/2014     3:57:43 PM CARE MANAGEMENT NOTE 07/10/2014  Patient:  [name], [name]   Account Number:  [id number]  Date Initiated:  07/10/2014  Documentation initiated by:  Fuller Mandril  Subjective/Objective Assessment:   58 y.o. female presenting with hypertension and shortness of breath. //Home with spouse.     Action/Plan:   IV diureses.//Access for [facility] services.   Anticipated DC Date:  07/10/2014   Anticipated DC Plan:  [place]  CM consult      Choice offered to / List presented to:             Status of service:  In process, will continue to follow Medicare Important Message given?  YES (If response is "NO", the following Medicare IM given date fields will be blank) Date Medicare IM given:  07/10/2014 Medicare IM given by:  [facility] Date Additional Medicare IM given:   Additional Medicare IM given by:    Discharge Disposition:    Per UR Regulation:  Reviewed for med. necessity/level of care/duration of stay  If discussed at [place] of Stay Meetings, dates discussed:    Comments:

## 2014-07-10 NOTE — Progress Notes (Signed)
    Subjective:  Some c/p earlier.  Objective:  Vital Signs in the last 24 hours: Temp:  [97 F (36.1 C)-98.2 F (36.8 C)] 98.2 F (36.8 C) (07/22 0500) Pulse Rate:  [64-78] 71 (07/22 0925) Resp:  [18] 18 (07/21 1346) BP: (147-175)/(60-121) 173/121 mmHg (07/22 0925) SpO2:  [97 %-98 %] 97 % (07/22 0500) Weight:  [144 lb 13.5 oz (65.7 kg)] 144 lb 13.5 oz (65.7 kg) (07/22 0500)  Intake/Output from previous day:  Intake/Output Summary (Last 24 hours) at 07/10/14 0950 Last data filed at 07/10/14 [id number]  Gross per 24 hour  Intake    346 ml  Output   1100 ml  Net   -754 ml    Physical Exam: General appearance: alert, cooperative, no distress and mildly obese Lungs: decreased rt base Heart: regular rate and rhythm Extremities: trace edema   Rate: 77  Rhythm: normal sinus rhythm  Lab Results:  Recent Labs  07/09/14 0530 07/10/14 0409  WBC 8.5 8.6  HGB 13.7 14.7  PLT 171 191    Recent Labs  07/09/14 0530 07/10/14 0409  NA 141 143  K 3.3* 4.1  CL 99 100  CO2 30 31  GLUCOSE 133* 118*  BUN 35* 32*  CREATININE 1.93* 2.09*    Recent Labs  07/07/14 2150 07/08/14 0300  TROPONINI 0.37* 0.36*    Recent Labs  07/08/14 0300  INR 1.05    Imaging: Imaging results have been reviewed  Cardiac Studies:  Assessment/Plan:  58 y/o married female from [place], who has worked in the [employer] for 30 years, followed in the past by Dr Pati Gallo for HTN. She says she has had "reactions" to several medications for her B/P in the past and in the last few years has stopped all her antihypertensives secondary to cost. She was admitted through the ER 07/08/14 with dyspnea and edema.    Principal Problem:   Acute on chronic diastolic CHF (congestive heart failure) Active Problems:   Hypertensive urgency   NSTEMI (non-ST elevated myocardial infarction)   Hypertension   Hypertensive cardiovascular disease   Renal insufficiency- unclear if chronic or not  Diverticulitis- lower GI bleed 2013   Non compliance w medication regimen secondary to "allergies" and cost   PUD (peptic ulcer disease)- endoscopy 2012    PLAN: Myoview this am.  Kerin Ransom PA-C Beeper [id number] 07/10/2014, 9:50 AM    History and all data above reviewed.  Patient examined.  I agree with the findings as above.  The patient exam reveals COR:RRR  ,  Lungs: Clear  ,  Abd: Positive bowel sounds, no rebound no guarding, Ext Moderate edema  .  All available labs, radiology testing, previous records reviewed. Agree with documented assessment and plan. Cardiomyopathy:  Probably related to HTN.  Awaiting results of Lexiscan Myoview.  Very difficult to titrate meds as she has CKD and intolerances.  I will switch to metoprolol today.  CKD:  Holding diuretic today with rising creat.  She needs Education officer, museum consult for disability.     Jeneen Rinks [facility]  11:32 AM  07/10/2014

## 2014-07-10 NOTE — Progress Notes (Signed)
ANTICOAGULATION CONSULT NOTE - Follow Up Consult  Pharmacy Consult for heparin Indication: NSTEMI  Labs:  Recent Labs  07/07/14 1606 07/07/14 2150  07/08/14 0300  07/09/14 0530 07/09/14 1300 07/10/14 0409  HGB  --   --   < > 13.5  --  13.7  --  14.7  HCT  --   --   --  42.2  --  43.0  --  46.7*  PLT  --   --   --  188  --  171  --  191  LABPROT  --   --   --  13.7  --   --   --   --   INR  --   --   --  1.05  --   --   --   --   HEPARINUNFRC  --  <0.10*  --   --   < > 0.72* 0.65 0.58  CREATININE  --   --   --  1.64*  --  1.93*  --  2.09*  TROPONINI 0.43* 0.37*  --  0.36*  --   --   --   --   < > = values in this interval not displayed.   Assessment: 58yo female now therapeutic on heparin for ACS Lexiscan planned for AM  Goal of Therapy:  Heparin level 0.3-0.7 units/ml   Plan:  Heparin drip at 1050 units / hr Follow up AM labs  Thank you. Anette Guarneri, PharmD [phone]  07/10/2014,10:43 AM

## 2014-07-11 LAB — CBC
HCT: 41.8 % (ref 36.0–46.0)
Hemoglobin: 13 g/dL (ref 12.0–15.0)
MCH: 25.4 pg — ABNORMAL LOW (ref 26.0–34.0)
MCHC: 31.1 g/dL (ref 30.0–36.0)
MCV: 81.6 fL (ref 78.0–100.0)
Platelets: 219 10*3/uL (ref 150–400)
RBC: 5.12 MIL/uL — ABNORMAL HIGH (ref 3.87–5.11)
RDW: 15.6 % — ABNORMAL HIGH (ref 11.5–15.5)
WBC: 7.5 10*3/uL (ref 4.0–10.5)

## 2014-07-11 LAB — BASIC METABOLIC PANEL
Anion gap: 11 (ref 5–15)
BUN: 32 mg/dL — ABNORMAL HIGH (ref 6–23)
CO2: 28 mEq/L (ref 19–32)
Calcium: 8.4 mg/dL (ref 8.4–10.5)
Chloride: 100 mEq/L (ref 96–112)
Creatinine, Ser: 2.04 mg/dL — ABNORMAL HIGH (ref 0.50–1.10)
GFR calc Af Amer: 30 mL/min — ABNORMAL LOW (ref >90)
GFR calc non Af Amer: 26 mL/min — ABNORMAL LOW (ref >90)
Glucose, Bld: 113 mg/dL — ABNORMAL HIGH (ref 70–99)
Potassium: 4.4 mEq/L (ref 3.7–5.3)
Sodium: 139 mEq/L (ref 137–147)

## 2014-07-11 LAB — HEPARIN LEVEL (UNFRACTIONATED): Heparin Unfractionated: <0.1 IU/mL — ABNORMAL LOW (ref 0.30–0.70)

## 2014-07-11 NOTE — Progress Notes (Signed)
Patient alert and oriented x4 throughout shift.  Vital signs stable.  Patient ambulated total of 461ft. in hallway throughout with RN and son.  Patient and son deny any questions or concerns at this time; will continue to monitor.

## 2014-07-11 NOTE — Plan of Care (Signed)
Problem: Phase I Progression Outcomes Goal: EF % per last Echo/documented,Core Reminder form on chart Outcome: Completed/Met Date Met:  07/11/14 EF 20-25% as of 06/2014

## 2014-07-11 NOTE — Progress Notes (Signed)
Patient Name: [name] Date of Encounter: 07/11/2014     Principal Problem:   Acute on chronic diastolic CHF (congestive heart failure) Active Problems:   Diverticulitis- lower GI bleed 2013   Hypertension   Hypertensive urgency   NSTEMI (non-ST elevated myocardial infarction)   Hypertensive cardiovascular disease   Renal insufficiency- unclear if chronic or not   Non compliance w medication regimen secondary to "allergies" and cost   PUD (peptic ulcer disease)- endoscopy 2012    SUBJECTIVE  Denies any chest pain or SOB.   CURRENT MEDS . amLODipine  10 mg Oral Daily  . aspirin EC  81 mg Oral Daily  . atorvastatin  40 mg Oral q1800  . heparin  5,000 Units Subcutaneous 3 times per day  . isosorbide mononitrate  120 mg Oral Daily  . metoprolol tartrate  50 mg Oral TID  . pantoprazole  40 mg Oral Q0600  . polyethylene glycol  17 g Oral Daily  . potassium chloride  20 mEq Oral BID  . sodium chloride  3 mL Intravenous Q12H    OBJECTIVE  Filed Vitals:   07/10/14 1300 07/10/14 2008 07/10/14 2142 07/11/14 0548  BP: 153/94 111/70 113/78 132/67  Pulse: 67 64 63 61  Temp: 98.1 F (36.7 C) 98.1 F (36.7 C)  98 F (36.7 C)  TempSrc: Oral Oral  Oral  Resp: 20 18  18   Height:      Weight:    142 lb 14.4 oz (64.819 kg)  SpO2: 98% 98%  98%    Intake/Output Summary (Last 24 hours) at 07/11/14 1013 Last data filed at 07/11/14 0900  Gross per 24 hour  Intake 920.58 ml  Output   3025 ml  Net -2104.42 ml   Filed Weights   07/09/14 0438 07/10/14 0500 07/11/14 0548  Weight: 146 lb 4.8 oz (66.361 kg) 144 lb 13.5 oz (65.7 kg) 142 lb 14.4 oz (64.819 kg)    PHYSICAL EXAM  General: [place], NAD. Neuro: Alert and oriented X 3. Moves all extremities spontaneously. Psych: Normal affect. HEENT:  Normal  Neck: Supple without bruits or JVD. Lungs:  Resp regular and unlabored, CTA. Heart: RRR no s3, s4, or murmurs. Abdomen: Soft, non-tender, non-distended, BS + x 4.    Extremities: No clubbing, cyanosis. DP/PT/Radials 2+ and equal bilaterally. 2+ edema in bilateral LE  Accessory Clinical Findings  CBC  Recent Labs  07/10/14 0409 07/11/14 0323  WBC 8.6 7.5  HGB 14.7 13.0  HCT 46.7* 41.8  MCV 83.5 81.6  PLT 191 [id number]   Basic Metabolic Panel  Recent Labs  07/10/14 0409 07/11/14 0323  NA 143 139  K 4.1 4.4  CL 100 100  CO2 31 28  GLUCOSE 118* 113*  BUN 32* 32*  CREATININE 2.09* 2.04*  CALCIUM 8.7 8.4    TELE  Sinus brady with HR 50-60s, no significant ventricular ectopy  ECG  NSR with HR 60s, LVH  Radiology/Studies  Dg Chest 2 View  07/07/2014   CLINICAL DATA:  Chest pressure.  Leg swelling.  EXAM: CHEST  2 VIEW  COMPARISON:  None.  FINDINGS: Cardiac silhouette is mildly enlarged. No mediastinal or hilar masses. There is central vascular congestion and mild interstitial thickening with small bilateral effusions. No pneumothorax.  Bony thorax is demineralized but intact.  IMPRESSION: Findings consistent with mild congestive heart failure with mild cardiomegaly central vascular congestion, small effusions and mild interstitial thickening.   Electronically Signed   By: Dedra Skeens.D.  On: 07/07/2014 12:08   Nm Myocar Multi W/spect W/wall Motion / Ef  07/10/2014   CLINICAL DATA:  Chest pain.  EXAM: MYOCARDIAL IMAGING WITH SPECT (REST AND PHARMACOLOGIC-STRESS)  GATED LEFT VENTRICULAR WALL MOTION STUDY  LEFT VENTRICULAR EJECTION FRACTION  TECHNIQUE: Standard myocardial SPECT imaging was performed after resting intravenous injection of 10 mCi Tc-43m sestamibi. Subsequently, intravenous infusion of Lexiscan was performed under the supervision of the Cardiology staff. At peak effect of the drug, 30 mCi Tc-43m sestamibi was injected intravenously and standard myocardial SPECT imaging was performed. Quantitative gated imaging was also performed to evaluate left ventricular wall motion, and estimate left ventricular ejection fraction.   COMPARISON:  None.  FINDINGS: The SPECT stress and rest images demonstrate apical thinning, slightly more pronounced on the rest images. No findings for infarction or ischemia. Global hypokinesis is noted on the gated SPECT images with an ejection fraction of 32%.  IMPRESSION: Global hypokinesis with estimated ejection fraction of 32%.  Apical thinning but no definite findings for infarction or ischemia.   Electronically Signed   By: Kalman Jewels M.D.   On: 07/10/2014 15:57    ASSESSMENT AND PLAN  1. Acute on chronic diastolic CHF (congestive heart failure)  - Echo 07/08/2014 EF 20-25%, elevated LV filling pressure, severe LVH, no regional wall motion abn, grade 3 diastolic dysf  - discussed with patient regarding fluid restriction and salt restriction  - advised patient to seek medical attention if weight increased by more than 3 lbs overnight or 5 lbs in a single week  - not on ACEI or ARB due to renal insufficiency. On nitrate, but no hydralazine due to allergy  - currently on metoprolol 50mg  TID for difficult to control BP, once BP controlled, will consider switching to metoprolol succinate  - off lasix due to renal insufficiency, -8L since admission. Recheck BMET tomorrow. Likely need low dose 20mg  lasix for maintenance up on discharge. Likely able to be discharged in 24-48 hours  2. Hypertensive urgency   - emphasized on medication compliance  - stable after addition of metoprolol 50mg  TID yesterday  3. NSTEMI (non-ST elevated myocardial infarction)   - troponin 0.49 on admission, trended down to 0.36  - likely demand ischemia in the setting of malignant hypertension  - 07/10/2014 global hypokinesis with EF 32%, apical thinning but no definite findings for infarction or ischemia  - no clear indication for cath at this time, if her CHF get worse, may consider for L and R heart cath at a later time  4. Renal insufficiency- unclear if chronic or not   - likely related to chronic  uncontrolled BP  5. Non compliance w medication regimen secondary to "allergies" and cost    Signed, Woodward Ku Pager: [phone]  History and all data above reviewed.  Patient examined.  I agree with the findings as above.  The patient exam reveals COR:RRR  ,  Lungs: Clear  ,  Abd: Positive bowel sounds, no rebound no guarding, Ext Mild edema  .  All available labs, radiology testing, previous records reviewed. Agree with documented assessment and plan. CHF:  Non ischemic by [facility].  Continue current meds.  Agree that she will likely need a low dose diuretic at home.  Her BP is much improved with the increased beta blocker.  Continue current therapy.  Possibly home in AM.   Minus Breeding  10:41 AM  07/11/2014

## 2014-07-12 ENCOUNTER — Other Ambulatory Visit: Payer: Self-pay | Admitting: Physician Assistant

## 2014-07-12 DIAGNOSIS — I5043 Acute on chronic combined systolic (congestive) and diastolic (congestive) heart failure: Secondary | ICD-10-CM

## 2014-07-12 DIAGNOSIS — I5021 Acute systolic (congestive) heart failure: Secondary | ICD-10-CM

## 2014-07-12 DIAGNOSIS — I509 Heart failure, unspecified: Secondary | ICD-10-CM

## 2014-07-12 DIAGNOSIS — I110 Hypertensive heart disease with heart failure: Secondary | ICD-10-CM

## 2014-07-12 DIAGNOSIS — N289 Disorder of kidney and ureter, unspecified: Secondary | ICD-10-CM

## 2014-07-12 LAB — BASIC METABOLIC PANEL
Anion gap: 11 (ref 5–15)
BUN: 30 mg/dL — ABNORMAL HIGH (ref 6–23)
CO2: 27 mEq/L (ref 19–32)
Calcium: 8.5 mg/dL (ref 8.4–10.5)
Chloride: 102 mEq/L (ref 96–112)
Creatinine, Ser: 2.15 mg/dL — ABNORMAL HIGH (ref 0.50–1.10)
GFR calc Af Amer: 28 mL/min — ABNORMAL LOW (ref >90)
GFR calc non Af Amer: 24 mL/min — ABNORMAL LOW (ref >90)
Glucose, Bld: 90 mg/dL (ref 70–99)
Potassium: 5 mEq/L (ref 3.7–5.3)
Sodium: 140 mEq/L (ref 137–147)

## 2014-07-12 LAB — CBC
HCT: 40.5 % (ref 36.0–46.0)
Hemoglobin: 12.7 g/dL (ref 12.0–15.0)
MCH: 25.9 pg — ABNORMAL LOW (ref 26.0–34.0)
MCHC: 31.4 g/dL (ref 30.0–36.0)
MCV: 82.7 fL (ref 78.0–100.0)
Platelets: 218 10*3/uL (ref 150–400)
RBC: 4.9 MIL/uL (ref 3.87–5.11)
RDW: 15.8 % — ABNORMAL HIGH (ref 11.5–15.5)
WBC: 7.8 10*3/uL (ref 4.0–10.5)

## 2014-07-12 MED ORDER — ISOSORBIDE MONONITRATE ER 120 MG PO TB24: 120.0000 mg | ORAL_TABLET | Freq: Every day | ORAL | Status: DC

## 2014-07-12 MED ORDER — METOPROLOL TARTRATE 50 MG PO TABS: 50.0000 mg | ORAL_TABLET | Freq: Three times a day (TID) | ORAL | Status: DC

## 2014-07-12 MED ORDER — ATORVASTATIN CALCIUM 40 MG PO TABS: 40.0000 mg | ORAL_TABLET | Freq: Every day | ORAL | Status: DC

## 2014-07-12 MED ORDER — AMLODIPINE BESYLATE 10 MG PO TABS: 10.0000 mg | ORAL_TABLET | Freq: Every day | ORAL | Status: DC

## 2014-07-12 MED ORDER — ASPIRIN 81 MG PO TBEC: 81.0000 mg | DELAYED_RELEASE_TABLET | Freq: Every day | ORAL | Status: DC

## 2014-07-12 NOTE — Progress Notes (Signed)
Clinical Social Work Department BRIEF PSYCHOSOCIAL ASSESSMENT 07/12/2014  Patient:  [name], [name]     Account Number:  [id number]     Admit date:  07/07/2014  Clinical Social Worker:  Valda Lamb  Date/Time:  07/12/2014 08:48 AM  Referred by:  Physician  Date Referred:  07/12/2014 Referred for  Other - See comment   Other Referral:   Disability paperwork   Interview type:  Patient Other interview type:    PSYCHOSOCIAL DATA Living Status:  FAMILY Admitted from facility:   Level of care:   Primary support name:  Krystelle Foskett [phone] Primary support relationship to patient:  SPOUSE Degree of support available:   CSW did report that her son tried to go to Social Security to complete her disability paperwork. It appears that pt has significant support at home.    CURRENT CONCERNS Current Concerns  Financial Resources   Other Concerns:    SOCIAL WORK ASSESSMENT / PLAN Covering CSW received a referral for disability paperwork.    CSW spoke with pt to obtain additional information regarding "disability paperwork" and whether pt was trying to apply for short term disability through her job or long term, meaning that she would no longer be able to work at all. Pt said she did not know what her doctor was referring to. CSW informed pt that if she is wanting to apply for long term disability then she will need to go to social security to apply. CSW provided education on applying for FMLA and short term disability which the paperwork would need to be received by her employer and signed by her PCP or attending MD in the hospital. Pt stated she was unsure and would then ask the doctor today.    CSW provided pt her contact number and informed her that CSW would assist once obtaining clarity on pt needs.   Assessment/plan status:  Psychosocial Support/Ongoing Assessment of Needs Other assessment/ plan:   Information/referral to community resources:   CSW provided pt with education  regarding long and short term disability as well as FMLA paperwork. CSW encouraged pt to contact her job and request to speak to human resources.    PATIENT'S/FAMILY'S RESPONSE TO PLAN OF CARE: Pt alert and oriented and appreciative of information. Pt did not appear to understand what type of disability her doctor is referring to that she will need. Pt states she has worked at [employer] for 30 years. Pt will contact CSW once additional information is obtained by her doctor.       Hunt Oris, MSW, [place]

## 2014-07-12 NOTE — Progress Notes (Signed)
CSW received a call from pt who confirmed that she would need long term disability. CSW visited pt room and provided pt with information regarding applying for Medicaid and disability. CSW also encouraged pt to contact the [employer] in [place] for additional support. Pt confirms that she has a phone interview with social security in August.Pt understands that she needs to go to DSS to apply for Medicaid as well as speak to her employer. Pt had no additional questions or concerns. Pt appreciative of visit. CSW signing off.  Hunt Oris, MSW, [place]

## 2014-07-12 NOTE — Discharge Summary (Signed)
Discharge Summary   Patient ID: [name],  MRN: [id number], DOB/AGE: 1956/11/27 58 y.o.  Admit date: 07/07/2014 Discharge date: 07/12/2014  Primary Care Provider: No primary provider on file. Primary Cardiologist: Dr. Acie Fredrickson  Discharge Diagnoses Principal Problem:   Acute on chronic combined systolic and diastolic HF (heart failure) Active Problems:   Diverticulitis- lower GI bleed 2013   Hypertension   Hypertensive urgency   NSTEMI (non-ST elevated myocardial infarction)   Hypertensive cardiovascular disease   Renal insufficiency- unclear if chronic or not   Non compliance w medication regimen secondary to "allergies" and cost   PUD (peptic ulcer disease)- endoscopy 2012   Allergies Allergies  Allergen Reactions  . Hydralazine Swelling  . Other Other (See Comments)    Unknown blood pressure medication caused face swelling    Procedures  Transthoracic Echocardiography  LV EF: 20% - 25%  ------------------------------------------------------------------- Indications: CHF - 428.0.  ------------------------------------------------------------------- History: PMH: Non-ST elevated myocardial infarction. Renal insufficiency. Noncompliance with medication. Risk factors: Hypertension.  ------------------------------------------------------------------- Study Conclusions  - Left ventricle: E/e&'>30 suggestive of markedly elevated LV filling pressures. The cavity size was normal. There was severe concentric hypertrophy. Systolic function was severely reduced. The estimated ejection fraction was in the range of 20% to 25%. Wall motion was normal; there were no regional wall motion abnormalities. There was a reduced contribution of atrial contraction to ventricular filling, due to increased ventricular diastolic pressure or atrial contractile dysfunction. Doppler parameters are consistent with a reversible restrictive pattern, indicative of decreased left ventricular  diastolic compliance and/or increased left atrial pressure (grade 3 diastolic dysfunction). - Aortic valve: Trileaflet; normal thickness, mildly calcified leaflets. - Mitral valve: There was trivial regurgitation. - Left atrium: The atrium was mildly dilated. - Right ventricle: The cavity size was mildly dilated. Wall thickness was normal. - Pulmonary arteries: PA peak pressure: 38 mm Hg (S).  Impressions:  - The right ventricular systolic pressure was increased consistent with mild pulmonary hypertension.     MYOCARDIAL IMAGING WITH SPECT (REST AND PHARMACOLOGIC-STRESS)  GATED LEFT VENTRICULAR WALL MOTION STUDY  LEFT VENTRICULAR EJECTION FRACTION  FINDINGS:  The SPECT stress and rest images demonstrate apical thinning,  slightly more pronounced on the rest images. No findings for  infarction or ischemia. Global hypokinesis is noted on the gated  SPECT images with an ejection fraction of 32%.  IMPRESSION:  Global hypokinesis with estimated ejection fraction of 32%.  Apical thinning but no definite findings for infarction or ischemia.    Hospital Course  The patient is a 58 year old female with significant past medical history of hypertension who presented to [facility] ED on 07/07/2014 with increasing dyspnea and edema for the past several weeks. She has stopped all of her blood pressure medication in the last several years due to cost. On arrival to the ED, was noted her blood pressure was 195/135. Cr 1.67. Chest x-ray was consistent with congestive heart failure. Pro BNP was greater than 33,000. Troponin was mildly elevated at 0.49, although the patient denies any chest pain. She was placed on IV diuresis. Coreg and amlodipine was added to help with her blood pressure. ACE inhibitor and ARB were contraindicated due to her kidney injury. Echocardiogram was obtained on 7/20 which showed EF 20-25%, no regional wall motion abnormality, severe LVH, grade 3 diastolic dysfunction, PA  peak pressure 38. Imdur was added to her medical regimen, however hydralazine was not able to be added due to her previously stated allergy. Her carvedilol was eventually switched to metoprolol 3  times a day due to uncontrolled blood pressure. She underwent Lexiscan stress test on 07/10/2014 EF 32%, apical thinning, however no definitive finding of infarction or ischemia. Her decreased EF is most consistent with nonischemic cardiomyopathy. Further emphasis has been placed on fluid and salt restriction and the importance of blood pressure control.   Patient was seen in the morning of 07/10/2014, her creatinine has increased from 1.67 to 2.1 during this admission. Otherwise her lung is clear and she is deemed stable for discharge. We will obtain a BMET next Monday to monitor her renal function. We will not discharge her on Lasix at this time due to renal dysfunction. However once her renal function improve, she will most likely need a low-dose Lasix for maintenance. I have discussed with the patient that she will need to call us if her weight increases by more than 3 pounds overnight or by more than 5 pounds in a single week as this may be a sign of fluid overload. I have also scheduled a followup appointment with transition of care followup at the next earliest available time.    Discharge Vitals Blood pressure 132/86, pulse 58, temperature 97.3 F (36.3 C), temperature source Oral, resp. rate 18, height 4\' 10"  (1.473 m), weight 144 lb 1.6 oz (65.363 kg), SpO2 96.00%.  Filed Weights   07/10/14 0500 07/11/14 0548 07/12/14 0511  Weight: 144 lb 13.5 oz (65.7 kg) 142 lb 14.4 oz (64.819 kg) 144 lb 1.6 oz (65.363 kg)    Labs  CBC  Recent Labs  07/11/14 0323 07/12/14 0340  WBC 7.5 7.8  HGB 13.0 12.7  HCT 41.8 40.5  MCV 81.6 82.7  PLT 219 99991111   Basic Metabolic Panel  Recent Labs  07/11/14 0323 07/12/14 0340  NA 139 140  K 4.4 5.0  CL 100 102  CO2 28 27  GLUCOSE 113* 90  BUN 32* 30*    CREATININE 2.04* 2.15*  CALCIUM 8.4 8.5    Disposition  Pt is being discharged home today in good condition.  Follow-up Plans & Appointments      Follow-up Information   Follow up with Richardson Dopp, PA-C On 07/25/2014. (2:55pm)    Specialty:  Physician Assistant   Contact information:   [address] [address] [phone]       Follow up with [facility] On 07/15/2014. (check BMET lab in the clinic on Monday, can go at anytime during the day as long as clinic open)    Specialty:  Cardiology   Contact information:   [address] [place] [address] [phone]      Discharge Medications    Medication List         acetaminophen 500 MG tablet  Commonly known as:  TYLENOL  Take 1,000 mg by mouth every 6 (six) hours as needed. For pain     amLODipine 10 MG tablet  Commonly known as:  NORVASC  Take 1 tablet (10 mg total) by mouth daily.     aspirin 81 MG EC tablet  Take 1 tablet (81 mg total) by mouth daily.     atorvastatin 40 MG tablet  Commonly known as:  LIPITOR  Take 1 tablet (40 mg total) by mouth daily at 6 PM.     diphenhydrAMINE 25 mg capsule  Commonly known as:  BENADRYL  Take 25 mg by mouth every 8 (eight) hours as needed for sleep (nausea).  diphenhydramine-acetaminophen 25-500 MG Tabs  Commonly known as:  TYLENOL PM  Take 1 tablet by mouth at bedtime as needed (sleep).     isosorbide mononitrate 120 MG 24 hr tablet  Commonly known as:  IMDUR  Take 1 tablet (120 mg total) by mouth daily.     metoprolol 50 MG tablet  Commonly known as:  LOPRESSOR  Take 1 tablet (50 mg total) by mouth 3 (three) times daily.     STRESS RELEAF PO  Take 1 tablet by mouth 2 (two) times daily.        Outstanding Labs/Studies  BMET next Monday, can go at any time  Duration of Discharge Encounter   Greater than 30 minutes including physician time.  Hilbert Corrigan PA-C 07/12/2014,  10:22 AM   Patient seen and examined.  Plan as discussed in my rounding note for today and outlined above. Jeneen Rinks [facility]  07/12/2014  10:24 AM

## 2014-07-12 NOTE — Progress Notes (Signed)
Of note, patient states she prefer to follow up with Dr. Percival Spanish.  Hilbert Corrigan PA Pager: [phone]

## 2014-07-12 NOTE — Progress Notes (Signed)
Patient Name: [name] Date of Encounter: 07/12/2014     Principal Problem:   Acute on chronic diastolic CHF (congestive heart failure) Active Problems:   Diverticulitis- lower GI bleed 2013   Hypertension   Hypertensive urgency   NSTEMI (non-ST elevated myocardial infarction)   Hypertensive cardiovascular disease   Renal insufficiency- unclear if chronic or not   Non compliance w medication regimen secondary to "allergies" and cost   PUD (peptic ulcer disease)- endoscopy 2012    SUBJECTIVE  Feeling ok last night. No CP or SOB.  CURRENT MEDS . amLODipine  10 mg Oral Daily  . aspirin EC  81 mg Oral Daily  . atorvastatin  40 mg Oral q1800  . heparin  5,000 Units Subcutaneous 3 times per day  . isosorbide mononitrate  120 mg Oral Daily  . metoprolol tartrate  50 mg Oral TID  . pantoprazole  40 mg Oral Q0600  . polyethylene glycol  17 g Oral Daily  . potassium chloride  20 mEq Oral BID  . sodium chloride  3 mL Intravenous Q12H    OBJECTIVE  Filed Vitals:   07/11/14 1539 07/11/14 1700 07/11/14 2220 07/12/14 0511  BP: 124/83 118/84 112/68 132/86  Pulse: 62 58 66 58  Temp:  98.4 F (36.9 C) 98.7 F (37.1 C) 97.3 F (36.3 C)  TempSrc:  Oral Oral Oral  Resp:  18 18 18   Height:      Weight:    144 lb 1.6 oz (65.363 kg)  SpO2:  98% 97% 96%    Intake/Output Summary (Last 24 hours) at 07/12/14 0851 Last data filed at 07/12/14 0515  Gross per 24 hour  Intake   1263 ml  Output    400 ml  Net    863 ml   Filed Weights   07/10/14 0500 07/11/14 0548 07/12/14 0511  Weight: 144 lb 13.5 oz (65.7 kg) 142 lb 14.4 oz (64.819 kg) 144 lb 1.6 oz (65.363 kg)    PHYSICAL EXAM  General: Pleasant, NAD. Neuro: Alert and oriented X 3. Moves all extremities spontaneously. Psych: Normal affect. HEENT:  Normal  Neck: Supple without bruits or JVD. Lungs:  Resp regular and unlabored, CTA. Heart: RRR no s3, s4, or murmurs. Abdomen: Soft, non-tender, non-distended, BS + x 4.    Extremities: No clubbing, cyanosis. DP/PT/Radials 2+ and equal bilaterally. 2+ pitting edema in bilateral LE  Accessory Clinical Findings  CBC  Recent Labs  07/11/14 0323 07/12/14 0340  WBC 7.5 7.8  HGB 13.0 12.7  HCT 41.8 40.5  MCV 81.6 82.7  PLT 219 99991111   Basic Metabolic Panel  Recent Labs  07/11/14 0323 07/12/14 0340  NA 139 140  K 4.4 5.0  CL 100 102  CO2 28 27  GLUCOSE 113* 90  BUN 32* 30*  CREATININE 2.04* 2.15*  CALCIUM 8.4 8.5    TELE  Sinus brady with HR 50-60s, no significant ventricular ectopy  ECG  No recent EKG  Radiology/Studies  Dg Chest 2 View  07/07/2014   CLINICAL DATA:  Chest pressure.  Leg swelling.  EXAM: CHEST  2 VIEW  COMPARISON:  None.  FINDINGS: Cardiac silhouette is mildly enlarged. No mediastinal or hilar masses. There is central vascular congestion and mild interstitial thickening with small bilateral effusions. No pneumothorax.  Bony thorax is demineralized but intact.  IMPRESSION: Findings consistent with mild congestive heart failure with mild cardiomegaly central vascular congestion, small effusions and mild interstitial thickening.   Electronically Signed  By: Lajean Manes M.D.   On: 07/07/2014 12:08   Nm Myocar Multi W/spect W/wall Motion / Ef  07/10/2014   CLINICAL DATA:  Chest pain.  EXAM: MYOCARDIAL IMAGING WITH SPECT (REST AND PHARMACOLOGIC-STRESS)  GATED LEFT VENTRICULAR WALL MOTION STUDY  LEFT VENTRICULAR EJECTION FRACTION  TECHNIQUE: Standard myocardial SPECT imaging was performed after resting intravenous injection of 10 mCi Tc-77m sestamibi. Subsequently, intravenous infusion of Lexiscan was performed under the supervision of the Cardiology staff. At peak effect of the drug, 30 mCi Tc-83m sestamibi was injected intravenously and standard myocardial SPECT imaging was performed. Quantitative gated imaging was also performed to evaluate left ventricular wall motion, and estimate left ventricular ejection fraction.  COMPARISON:   None.  FINDINGS: The SPECT stress and rest images demonstrate apical thinning, slightly more pronounced on the rest images. No findings for infarction or ischemia. Global hypokinesis is noted on the gated SPECT images with an ejection fraction of 32%.  IMPRESSION: Global hypokinesis with estimated ejection fraction of 32%.  Apical thinning but no definite findings for infarction or ischemia.   Electronically Signed   By: Kalman Jewels M.D.   On: 07/10/2014 15:57    ASSESSMENT AND PLAN  1. Acute on chronic diastolic CHF (congestive heart failure)  - Echo 07/08/2014 EF 20-25%, elevated LV filling pressure, severe LVH, no regional wall motion abn, grade 3 diastolic dysf  - discussed with patient regarding fluid restriction and salt restriction  - advised patient to seek medical attention if weight increased by more than 3 lbs overnight or 5 lbs in a single week  - not on ACEI or ARB due to renal insufficiency. On nitrate, but no hydralazine due to allergy  - continue metoprolol - off lasix due to renal insufficiency, -8L since admission. - renal function worsened, Cr 2.04 --> 2.15 despite avoidance of lasix or other nephrotoxic medication - consider for nephrology consult  2. Hypertensive urgency  - emphasized on medication compliance  - stable after addition of metoprolol 50mg  TID yesterday   3. NSTEMI (non-ST elevated myocardial infarction)  - troponin 0.49 on admission, trended down to 0.36  - likely demand ischemia in the setting of malignant hypertension  - 07/10/2014 global hypokinesis with EF 32%, apical thinning but no definite findings for infarction or ischemia  - no clear indication for cath at this time, if her CHF get worse, may consider for L and R heart cath at a later time   4. Renal insufficiency- unclear if chronic or not  - likely related to chronic uncontrolled BP   5. Non compliance w medication regimen secondary to "allergies" and cost    Signed, Almyra Deforest  PA-C  History and all data above reviewed.  Patient examined.  I agree with the findings as above.  Breathing and swelling is better.  The patient exam reveals COR:RRR  ,  Lungs: Clear  ,  Abd: Positive bowel sounds, no rebound no guarding, Ext Mild/mod foot edema  .  All available labs, radiology testing, previous records reviewed. Agree with documented assessment and plan. OK to discharge on meds as listed.  Needs BMET on Monday.  Send results to Dr. Acie Fredrickson.  Needs TOC appt within one week.    Minus Breeding  9:19 AM  07/12/2014

## 2014-07-12 NOTE — Discharge Instructions (Signed)
Acute Kidney Injury Acute kidney injury is a disease in which there is sudden (acute) damage to the kidneys. The kidneys are 2 organs that lie on either side of the spine between the middle of the back and the front of the abdomen. The kidneys:  Remove wastes and extra water from the blood.   Produce important hormones. These help keep bones strong, regulate blood pressure, and help create red blood cells.   Balance the fluids and chemicals in the blood and tissues. A small amount of kidney damage may not cause problems, but a large amount of damage may make it difficult or impossible for the kidneys to work the way they should. Acute kidney injury may develop into long-lasting (chronic) kidney disease. It may also develop into a life-threatening disease called end-stage kidney disease. Acute kidney injury can get worse very quickly, so it should be treated right away. Early treatment may prevent other kidney diseases from developing.  CAUSES   A problem with blood flow to the kidneys. This may be caused by:   Blood loss.   Heart disease.   Severe burns.   Liver disease.  Direct damage to the kidneys. This may be caused by:  Some medicines.   A kidney infection.   Poisoning or consuming toxic substances.   A surgical wound.   A blow to the kidney area.   A problem with urine flow. This may be caused by:   Cancer.   Kidney stones.   An enlarged prostate. SYMPTOMS   Swelling (edema) of the legs, ankles, or feet.   Tiredness (lethargy).   Nausea or vomiting.   Confusion.   Problems with urination, such as:   Painful or burning feeling during urination.   Decreased urine production.   Frequent accidents in children who are potty trained.   Bloody urine.   Muscle twitches and cramps.   Shortness of breath.   Seizures.   Chest pain or pressure. Sometimes, no symptoms are present. DIAGNOSIS Acute kidney injury may be detected  and diagnosed by tests, including blood, urine, imaging, or kidney biopsy tests.  TREATMENT Treatment of acute kidney injury varies depending on the cause and severity of the kidney damage. In mild cases, no treatment may be needed. The kidneys may heal on their own. If acute kidney injury is more severe, your caregiver will treat the cause of the kidney damage, help the kidneys heal, and prevent complications from occurring. Severe cases may require a procedure to remove toxic wastes from the body (dialysis) or surgery to repair kidney damage. Surgery may involve:   Repair of a torn kidney.   Removal of an obstruction. Most of the time, you will need to stay overnight at the hospital.  HOME CARE INSTRUCTIONS:  Follow your prescribed diet.  Only take over-the-counter or prescription medicines as directed by your caregiver.  Do not take any new medicines (prescription, over-the-counter, or nutritional supplements) unless approved by your caregiver. Many medicines can worsen your kidney damage or need to have the dose adjusted.   Keep all follow-up appointments as directed by your caregiver.  Observe your condition to make sure you are healing as expected. SEEK IMMEDIATE MEDICAL CARE IF:  You are feeling ill or have severe pain in the back or side.   Your symptoms return or you have new symptoms.  You have any symptoms of end-stage kidney disease. These include:   Persistent itchiness.   Loss of appetite.   Headaches.   Abnormally dark  or light skin.  Numbness in the hands or feet.   Easy bruising.   Frequent hiccups.   Menstruation stops.   You have a fever.  You have increased urine production.  You have pain or bleeding when urinating. MAKE SURE YOU:   Understand these instructions.  Will watch your condition.  Will get help right away if you are not doing well or get worse Document Released: 06/21/2011 Document Revised: 04/02/2013 Document  Reviewed: 08/04/2012 [facility] Patient Information 2015 [place], [place]. This information is not intended to replace advice given to you by your health care provider. Make sure you discuss any questions you have with your health care provider.  Heart Failure Heart failure means your heart has trouble pumping blood. This makes it hard for your body to work well. Heart failure is usually a long-term (chronic) condition. You must take good care of yourself and follow your doctor's treatment plan. HOME CARE  Take your heart medicine as told by your doctor.  Do not stop taking medicine unless your doctor tells you to.  Do not skip any dose of medicine.  Refill your medicines before they run out.  Take other medicines only as told by your doctor or pharmacist.  Stay active if told by your doctor. The elderly and people with severe heart failure should talk with a doctor about physical activity.  Eat heart-healthy foods. Choose foods that are without trans fat and are low in saturated fat, cholesterol, and salt (sodium). This includes fresh or frozen fruits and vegetables, fish, lean meats, fat-free or low-fat dairy foods, whole grains, and high-fiber foods. Lentils and dried peas and beans (legumes) are also good choices.  Limit salt if told by your doctor.  Cook in a healthy way. Roast, grill, broil, bake, poach, steam, or stir-fry foods.  Limit fluids as told by your doctor.  Weigh yourself every morning. Do this after you pee (urinate) and before you eat breakfast. Write down your weight to give to your doctor.  Take your blood pressure and write it down if your doctor tells you to.  Ask your doctor how to check your pulse. Check your pulse as told.  Lose weight if told by your doctor.  Stop smoking or chewing tobacco. Do not use gum or patches that help you quit without your doctor's approval.  Schedule and go to doctor visits as told.  Nonpregnant women should have no more than 1  drink a day. Men should have no more than 2 drinks a day. Talk to your doctor about drinking alcohol.  Stop illegal drug use.  Stay current with shots (immunizations).  Manage your health conditions as told by your doctor.  Learn to manage your stress.  Rest when you are tired.  If it is really hot outside:  Avoid intense activities.  Use air conditioning or fans, or get in a cooler place.  Avoid caffeine and alcohol.  Wear loose-fitting, lightweight, and light-colored clothing.  If it is really cold outside:  Avoid intense activities.  Layer your clothing.  Wear mittens or gloves, a hat, and a scarf when going outside.  Avoid alcohol.  Learn about heart failure and get support as needed.  Get help to maintain or improve your quality of life and your ability to care for yourself as needed. GET HELP IF:   You gain 03 lb/1.4 kg or more in 1 day or 05 lb/2.3 kg in a week.  You are more short of breath than usual.  You cannot do your normal activities.  You tire easily.  You cough more than normal, especially with activity.  You have any or more puffiness (swelling) in areas such as your hands, feet, ankles, or belly (abdomen).  You cannot sleep because it is hard to breathe.  You feel like your heart is beating fast (palpitations).  You get dizzy or light-headed when you stand up. GET HELP RIGHT AWAY IF:   You have trouble breathing.  There is a change in mental status, such as becoming less alert or not being able to focus.  You have chest pain or discomfort.  You faint. MAKE SURE YOU:   Understand these instructions.  Will watch your condition.  Will get help right away if you are not doing well or get worse. Document Released: 09/14/2008 Document Revised: 04/22/2014 Document Reviewed: 01/22/2013 [facility] Patient Information 2015 [place], [place]. This information is not intended to replace advice given to you by your health care provider. Make  sure you discuss any questions you have with your health care provider.

## 2014-07-12 NOTE — Progress Notes (Signed)
D/C Tele, D/C IV, D/C instructions reviewed with pt. And pt.'s son, D/C paperwork given to pt., pt. Showed no signs or symptoms of distress, prescriptions given to pt., pt. Transported off unit via wheelchair by NT, transported home via son.

## 2014-07-15 ENCOUNTER — Other Ambulatory Visit (INDEPENDENT_AMBULATORY_CARE_PROVIDER_SITE_OTHER): Payer: Managed Care, Other (non HMO)

## 2014-07-15 DIAGNOSIS — I5043 Acute on chronic combined systolic (congestive) and diastolic (congestive) heart failure: Secondary | ICD-10-CM

## 2014-07-15 DIAGNOSIS — I509 Heart failure, unspecified: Secondary | ICD-10-CM

## 2014-07-15 LAB — BASIC METABOLIC PANEL
BUN: 47 mg/dL — ABNORMAL HIGH (ref 6–23)
CO2: 26 mEq/L (ref 19–32)
Calcium: 8.7 mg/dL (ref 8.4–10.5)
Chloride: 106 mEq/L (ref 96–112)
Creatinine, Ser: 2.1 mg/dL — ABNORMAL HIGH (ref 0.4–1.2)
GFR: 25.29 mL/min — ABNORMAL LOW (ref >60.00)
Glucose, Bld: 110 mg/dL — ABNORMAL HIGH (ref 70–99)
Potassium: 5.5 mEq/L — ABNORMAL HIGH (ref 3.5–5.1)
Sodium: 139 mEq/L (ref 135–145)

## 2014-07-25 ENCOUNTER — Encounter: Payer: Managed Care, Other (non HMO) | Admitting: Physician Assistant

## 2014-07-26 ENCOUNTER — Encounter: Payer: Self-pay | Admitting: *Deleted

## 2014-08-12 ENCOUNTER — Encounter: Payer: Self-pay | Admitting: Physician Assistant

## 2014-08-12 ENCOUNTER — Ambulatory Visit (INDEPENDENT_AMBULATORY_CARE_PROVIDER_SITE_OTHER): Payer: Managed Care, Other (non HMO) | Admitting: Physician Assistant

## 2014-08-12 VITALS — BP 180/90 | HR 47 | Ht <= 58 in | Wt 151.0 lb

## 2014-08-12 DIAGNOSIS — I110 Hypertensive heart disease with heart failure: Secondary | ICD-10-CM

## 2014-08-12 DIAGNOSIS — I5042 Chronic combined systolic (congestive) and diastolic (congestive) heart failure: Secondary | ICD-10-CM

## 2014-08-12 DIAGNOSIS — I509 Heart failure, unspecified: Secondary | ICD-10-CM

## 2014-08-12 DIAGNOSIS — I428 Other cardiomyopathies: Secondary | ICD-10-CM | POA: Insufficient documentation

## 2014-08-12 DIAGNOSIS — N189 Chronic kidney disease, unspecified: Secondary | ICD-10-CM

## 2014-08-12 MED ORDER — METOPROLOL TARTRATE 50 MG PO TABS: 50.0000 mg | ORAL_TABLET | Freq: Two times a day (BID) | ORAL | Status: DC

## 2014-08-12 MED ORDER — FUROSEMIDE 20 MG PO TABS: 20.0000 mg | ORAL_TABLET | ORAL | Status: DC | PRN

## 2014-08-12 MED ORDER — CLONIDINE HCL 0.1 MG PO TABS: 0.1000 mg | ORAL_TABLET | Freq: Two times a day (BID) | ORAL | Status: DC

## 2014-08-12 NOTE — Progress Notes (Signed)
Cardiology Office Note    Date:  08/12/2014   ID:  [name], DOB 1956-09-29, MRN [id number]  PCP:  No primary provider on file.  Cardiologist:  Saw Dr. Liam Rogers in the hospital  >>> prefers Dr. Minus Breeding    History of Present Illness: [name] is a 58 y.o. female with a long hx of uncontrolled HTN (poor adherence due to cost and adverse effects), tobacco abuse.  She was admitted 7/19-7/24 with acute CHF (combined systolic and diastolic) and a/c renal failure (peak Cr 2.15) in the setting of hypertensive urgency with associated elevated in troponin (peak 0.49).   Echo demonstrated reduced LV function with EF 20-25%.  ACEI was not started due to CKD.  Patient noted hx of true allergy to Hydralazine.  She was placed on Coreg and Isosorbide.  Coreg was switched to Metoprolol.  She underwent noninvasive testing with Lexiscan Myoview instead of L heart cath due to CKD.  Myoview demonstrated no scar or ischemia.  Elevated troponin was felt to be from demand ischemia.  Etiology of cardiomyopathy was felt to be non-ischemic and probably related to uncontrolled HTN.  She had good diuresis with IV Lasix.    She returns for FU.  She continues to feel better.  Breathing is improved.  She remains NYHA 2b.  She sleeps on 2 pillows chronically.  She has occasional brief chest pains.  Denies exertional chest pain.  Denies PND.  LE edema remains improved.  Weights at home have been stable.  She denies syncope.    Studies:  - Echo (06/2014):  Severe LVH, EF 20-25%, no RWMA, Gr 3 DD, trivial MR, mild LAE, mild RVE, PASP 38 mmHg.  - Nuclear (06/2014):  EF 32%, apical thinning, no definite scar or ischemia   Recent Labs/Images: 07/07/2014: Pro B Natriuretic peptide (BNP) 33636.0*  07/08/2014: HDL Cholesterol by NMR 43; LDL (calc) 96; TSH 4.870*  07/12/2014: Hemoglobin 12.7  07/15/2014: Creatinine 2.1*; Potassium 5.5*   Wt Readings from Last 3 Encounters:  08/12/14 151 lb (68.493 kg)  07/12/14 144 lb  1.6 oz (65.363 kg)  09/30/12 152 lb (68.947 kg)     Past Medical History  Diagnosis Date  . Diverticular disease   . Hypertension     Current Outpatient Prescriptions  Medication Sig Dispense Refill  . acetaminophen (TYLENOL) 500 MG tablet Take 1,000 mg by mouth every 6 (six) hours as needed. For pain      . amLODipine (NORVASC) 10 MG tablet Take 1 tablet (10 mg total) by mouth daily.  30 tablet  12  . aspirin EC 81 MG EC tablet Take 1 tablet (81 mg total) by mouth daily.      Marland Kitchen atorvastatin (LIPITOR) 40 MG tablet Take 1 tablet (40 mg total) by mouth daily at 6 PM.  30 tablet  12  . isosorbide mononitrate (IMDUR) 120 MG 24 hr tablet Take 1 tablet (120 mg total) by mouth daily.  30 tablet  12  . metoprolol (LOPRESSOR) 50 MG tablet Take 1 tablet (50 mg total) by mouth 3 (three) times daily.  90 tablet  12   No current facility-administered medications for this visit.     Allergies:   Hydralazine and Other   Social History:  The patient  reports that she has been smoking.  She does not have any smokeless tobacco history on file. She reports that she does not drink alcohol or use illicit drugs.   Family History:  The patient's family history  includes Coronary artery disease in an other family member; Heart attack in her paternal grandfather; Hypertension in an other family member; Stroke in her paternal grandmother.   ROS:  Please see the history of present illness.      All other systems reviewed and negative.   PHYSICAL EXAM: VS:  BP 180/90  Pulse 47  Ht 4\' 10"  (1.473 m)  Wt 151 lb (68.493 kg)  BMI 31.57 kg/m2 Well nourished, well developed, in no acute distress HEENT: normal Neck: no JVD Cardiac:  normal S1, S2; RRR; no murmur Lungs:  clear to auscultation bilaterally, no wheezing, rhonchi or rales Abd: soft, nontender, no hepatomegaly Ext: trace bilateral LE edema- compression stockings in place Skin: warm and dry Neuro:  CNs 2-12 intact, no focal abnormalities  noted  EKG:  Sinus brady, HR 47, QTc 454 ms, LVH with repol abnormality, no change from prior tracing      ASSESSMENT AND PLAN:  Hypertensive heart disease with heart failure:  BP remains elevated.  ACEI/ARB has not been used due to CKD.  Spironolactone is also not an option for this reason.  She describes angioedema as a reaction to Hydralazine.  She is on a large dose of Isosorbide already.  Her HR is in the 40s on metoprolol tartrate TID.  It is not clear to me why Carvedilol was changed to Metoprolol in the hospital.  I will add Clonidine 0.1 mg bid.  B/c of the potential to cause bradycardia, I will decreased Metoprolol to 50 mg BID.  With CKD, I will check renal artery dopplers to r/o RAS.    Chronic combined systolic and diastolic HF (heart failure):  Volume remains stable. We discussed the importance of daily weights.  I will give her Lasix to use prn (she knows to call us first before she decides to take it).   NICM (nonischemic cardiomyopathy):  Likely related to HTN.  We will need to repeat an echo once her HTN is controlled.  CKD (chronic kidney disease), unspecified stage:  Repeat BMET today.  Consider referral to nephrology.  Check renal artery [place] as noted.    Disposition:  FU with me in 2-3 weeks and Dr. Minus Breeding in 6-8 weeks.  I completed her FMLA paperwork today.  She tells me that Dr. Percival Spanish recommended she go on disability.     Signed, Versie Starks, MHS 08/12/2014 3:16 PM    [facility] [address], [place], [place]  [address] Phone: [phone]; Fax: [phone]

## 2014-08-12 NOTE — Patient Instructions (Signed)
Your physician has recommended you make the following change in your medication: 1. Decrease Metoprolol tartrate to 50 MG Twice a day 2. Start Clonidine 0.1 MG 1 tablet Twice a day 3. We will give you a RX for Lasix 20 MG as needed  Your physician recommends that you go to the lab today for a BMET  Your physician has requested that you have a renal artery duplex. During this test, an ultrasound is used to evaluate blood flow to the kidneys. Allow one hour for this exam. Do not eat after midnight the day before and avoid carbonated beverages. Take your medications as you usually do.  Your physician recommends that you weigh, daily, at the same time every day, and in the same amount of clothing. Call us if your weight increased 3 pounds of more in a day or 5 pounds or more in a week  Your physician recommends that you schedule a follow-up appointment in: 2-3 weeks with [name] physician recommends that you schedule a follow-up appointment in: 6-8 weeks with [name] Spanish

## 2014-08-13 ENCOUNTER — Telehealth: Payer: Self-pay | Admitting: *Deleted

## 2014-08-13 LAB — BASIC METABOLIC PANEL
BUN: 31 mg/dL — ABNORMAL HIGH (ref 6–23)
CO2: 27 mEq/L (ref 19–32)
Calcium: 9.5 mg/dL (ref 8.4–10.5)
Chloride: 105 mEq/L (ref 96–112)
Creatinine, Ser: 1.7 mg/dL — ABNORMAL HIGH (ref 0.4–1.2)
GFR: 31.75 mL/min — ABNORMAL LOW (ref >60.00)
Glucose, Bld: 97 mg/dL (ref 70–99)
Potassium: 4.7 mEq/L (ref 3.5–5.1)
Sodium: 139 mEq/L (ref 135–145)

## 2014-08-13 NOTE — Telephone Encounter (Signed)
I called pt verified address to mail out Rx for compression stockings. I s/w pt and said that [place] had sent me a note today and said he forgot to give Rx yesterday for stockings. Pt said thank you

## 2014-08-13 NOTE — Telephone Encounter (Signed)
pt notified about lab with verbal understanding.

## 2014-08-14 ENCOUNTER — Ambulatory Visit (HOSPITAL_COMMUNITY): Payer: Managed Care, Other (non HMO) | Attending: Cardiovascular Disease | Admitting: *Deleted

## 2014-08-14 DIAGNOSIS — F17200 Nicotine dependence, unspecified, uncomplicated: Secondary | ICD-10-CM | POA: Diagnosis not present

## 2014-08-14 DIAGNOSIS — N189 Chronic kidney disease, unspecified: Secondary | ICD-10-CM | POA: Diagnosis not present

## 2014-08-14 DIAGNOSIS — I110 Hypertensive heart disease with heart failure: Secondary | ICD-10-CM

## 2014-08-14 DIAGNOSIS — I129 Hypertensive chronic kidney disease with stage 1 through stage 4 chronic kidney disease, or unspecified chronic kidney disease: Secondary | ICD-10-CM | POA: Insufficient documentation

## 2014-08-14 DIAGNOSIS — I10 Essential (primary) hypertension: Secondary | ICD-10-CM

## 2014-08-14 NOTE — Progress Notes (Signed)
Visceral Duplex Complete

## 2014-08-15 ENCOUNTER — Encounter: Payer: Self-pay | Admitting: Physician Assistant

## 2014-08-15 ENCOUNTER — Telehealth: Payer: Self-pay | Admitting: *Deleted

## 2014-08-15 NOTE — Telephone Encounter (Signed)
pt notified about renal u/s results with verbal understanding

## 2014-09-02 ENCOUNTER — Ambulatory Visit (INDEPENDENT_AMBULATORY_CARE_PROVIDER_SITE_OTHER): Payer: Managed Care, Other (non HMO) | Admitting: Physician Assistant

## 2014-09-02 ENCOUNTER — Encounter: Payer: Self-pay | Admitting: Physician Assistant

## 2014-09-02 VITALS — BP 145/90 | HR 53 | Ht <= 58 in | Wt 155.0 lb

## 2014-09-02 DIAGNOSIS — I428 Other cardiomyopathies: Secondary | ICD-10-CM

## 2014-09-02 DIAGNOSIS — I509 Heart failure, unspecified: Secondary | ICD-10-CM

## 2014-09-02 DIAGNOSIS — I110 Hypertensive heart disease with heart failure: Secondary | ICD-10-CM

## 2014-09-02 DIAGNOSIS — N189 Chronic kidney disease, unspecified: Secondary | ICD-10-CM

## 2014-09-02 DIAGNOSIS — I5042 Chronic combined systolic (congestive) and diastolic (congestive) heart failure: Secondary | ICD-10-CM

## 2014-09-02 DIAGNOSIS — R0602 Shortness of breath: Secondary | ICD-10-CM

## 2014-09-02 LAB — BASIC METABOLIC PANEL
BUN: 36 mg/dL — ABNORMAL HIGH (ref 6–23)
CO2: 25 mEq/L (ref 19–32)
Calcium: 9 mg/dL (ref 8.4–10.5)
Chloride: 107 mEq/L (ref 96–112)
Creatinine, Ser: 1.7 mg/dL — ABNORMAL HIGH (ref 0.4–1.2)
GFR: 33.75 mL/min — ABNORMAL LOW (ref >60.00)
Glucose, Bld: 97 mg/dL (ref 70–99)
Potassium: 4.5 mEq/L (ref 3.5–5.1)
Sodium: 139 mEq/L (ref 135–145)

## 2014-09-02 LAB — BRAIN NATRIURETIC PEPTIDE: Pro B Natriuretic peptide (BNP): 21 pg/mL (ref 0.0–100.0)

## 2014-09-02 MED ORDER — CLONIDINE HCL 0.2 MG PO TABS: 0.2000 mg | ORAL_TABLET | Freq: Two times a day (BID) | ORAL | Status: DC

## 2014-09-02 NOTE — Progress Notes (Signed)
Cardiology Office Note    Date:  09/02/2014   ID:  [name], DOB 01-15-1956, MRN [id number]  PCP:  No primary provider on file.  Cardiologist:   Dr. Minus Breeding    History of Present Illness: [name] is a 58 y.o. female with a long hx of uncontrolled HTN (poor adherence due to cost and adverse effects), tobacco abuse.  She was admitted 06/2014 with acute combined systolic and diastolic CHF and a/c renal failure (peak Cr 2.15) in the setting of hypertensive urgency with associated elevated in troponin (peak 0.49).   Echo demonstrated reduced LV function with EF 20-25%.  ACEI was not started due to CKD.  Patient noted hx of true allergy to Hydralazine.  She was placed on Coreg and Isosorbide.  Coreg was switched to Metoprolol.  She underwent noninvasive testing with Lexiscan Myoview instead of L heart cath due to CKD.  Myoview demonstrated no scar or ischemia.  Elevated troponin was felt to be from demand ischemia.  Etiology of cardiomyopathy was felt to be non-ischemic and probably related to uncontrolled HTN.    I saw her in FU 8/24.  She was stable but her BP was out of control.  I added clonidine.  FU Creatinine was improved.  Renal artery [place] was neg for RAS.  She returns for FU.  She is doing well.  Still has some edema in her ankles.  Denies significant weight gain.  No orthopnea, PND.  She had an episode of brief atypical chest pain 1 week ago.  This was not related to exertion.  There were no assoc symptoms.  No syncope or near syncope. She notes chronic DOE.  She is NYHA 2b.     Studies:  - Echo (06/2014):  Severe LVH, EF 20-25%, no RWMA, Gr 3 DD, trivial MR, mild LAE, mild RVE, PASP 38 mmHg.  - Nuclear (06/2014):  EF 32%, apical thinning, no definite scar or ischemia   Recent Labs/Images: 07/07/2014: Pro B Natriuretic peptide (BNP) 33636.0*  07/08/2014: HDL Cholesterol by NMR 43; LDL (calc) 96; TSH 4.870*  07/12/2014: Hemoglobin 12.7  08/12/2014: Creatinine 1.7*; Potassium  4.7   Wt Readings from Last 3 Encounters:  09/02/14 155 lb (70.308 kg)  08/12/14 151 lb (68.493 kg)  07/12/14 144 lb 1.6 oz (65.363 kg)     Past Medical History  Diagnosis Date  . Diverticular disease   . Hypertension     a. Renal Artery Duplex (8/15):  no RAS    Current Outpatient Prescriptions  Medication Sig Dispense Refill  . acetaminophen (TYLENOL) 500 MG tablet Take 1,000 mg by mouth every 6 (six) hours as needed. For pain      . amLODipine (NORVASC) 10 MG tablet Take 1 tablet (10 mg total) by mouth daily.  30 tablet  12  . aspirin EC 81 MG EC tablet Take 1 tablet (81 mg total) by mouth daily.      Marland Kitchen atorvastatin (LIPITOR) 40 MG tablet Take 1 tablet (40 mg total) by mouth daily at 6 PM.  30 tablet  12  . cloNIDine (CATAPRES) 0.1 MG tablet Take 1 tablet (0.1 mg total) by mouth 2 (two) times daily.  60 tablet  11  . furosemide (LASIX) 20 MG tablet Take 20 mg by mouth as needed for edema (with a weight gain of 3lbs call Dr. for permission to take).      . isosorbide mononitrate (IMDUR) 120 MG 24 hr tablet Take 1 tablet (120 mg total) by  mouth daily.  30 tablet  12  . metoprolol (LOPRESSOR) 50 MG tablet Take 1 tablet (50 mg total) by mouth 2 (two) times daily.  30 tablet  11   No current facility-administered medications for this visit.     Allergies:   Hydralazine and Other   Social History:  The patient  reports that she has been smoking.  She does not have any smokeless tobacco history on file. She reports that she does not drink alcohol or use illicit drugs.   Family History:  The patient's family history includes Coronary artery disease in an other family member; Heart attack in her paternal grandfather; Hypertension in an other family member; Stroke in her paternal grandmother.   ROS:  Please see the history of present illness.      All other systems reviewed and negative.   PHYSICAL EXAM: VS:  BP 145/90  Pulse 53  Ht 4\' 10"  (1.473 m)  Wt 155 lb (70.308 kg)  BMI  32.40 kg/m2 Well nourished, well developed, in no acute distress HEENT: normal Neck: no JVD Cardiac:  normal S1, S2; RRR; no murmur Lungs:  clear to auscultation bilaterally, no wheezing, rhonchi or rales Abd: soft, nontender, no hepatomegaly Ext: trace bilateral ankle edema- compression stockings in place Skin: warm and dry Neuro:  CNs 2-12 intact, no focal abnormalities noted  EKG:  Sinus brady, HR 53, LVH, no change from prior tracing   ASSESSMENT AND PLAN:  1. Hypertensive heart disease with heart failure:  BP improved.  We had a long discussion regarding whether to try ACEI or adjust her current medications.  She has a lot of intolerances to medications in the past.  I have chosen to increase her Clonidine to 0.2 mg BID.  If renal function remains stable, we could eventually try her on an ACEI and follow her renal function closely.     2. Chronic combined systolic and diastolic HF (heart failure):  Volume stable.  She may take Lasix prn for edema or weight gain.  I will check a BMET and BNP today.  If BNP high, I will have her resume Lasix daily. 3. NICM (nonischemic cardiomyopathy):  Likely related to HTN.  Continue beta blocker, nitrates.  She is not on ACEI/ARB 2/2 CKD.  She has a true allergy to Hydralazine.  Consider FU echo once BP controlled.  4. CKD (chronic kidney disease), unspecified stage:  Repeat BMET today.   Disposition:  FU with Dr. Minus Breeding next month as planned.   Signed, Versie Starks, MHS 09/02/2014 8:54 AM    [facility] [address], [place], [place]  [address] Phone: [phone]; Fax: [phone]

## 2014-09-02 NOTE — Patient Instructions (Addendum)
Your physician has recommended you make the following change in your medication:   1. Increase Clonidine to 0.2 mg 1 tablet twice a day.   Your physician recommends that you return for lab work today for BNP and BMet.   Your physician recommends that you follow up as scheduled with Dr. Percival Spanish at our [facility].

## 2014-09-03 ENCOUNTER — Telehealth: Payer: Self-pay | Admitting: *Deleted

## 2014-09-03 NOTE — Telephone Encounter (Signed)
Follow Up   Pt returned call for results//sr

## 2014-09-03 NOTE — Telephone Encounter (Signed)
pt notified about lab results with verbal understanding to results given today

## 2014-09-03 NOTE — Telephone Encounter (Signed)
lmptcb for lab results 

## 2014-09-23 ENCOUNTER — Encounter: Payer: Self-pay | Admitting: Cardiology

## 2014-09-23 ENCOUNTER — Ambulatory Visit (INDEPENDENT_AMBULATORY_CARE_PROVIDER_SITE_OTHER): Payer: Managed Care, Other (non HMO) | Admitting: Cardiology

## 2014-09-23 VITALS — BP 164/90 | HR 49 | Ht <= 58 in | Wt 159.0 lb

## 2014-09-23 DIAGNOSIS — I5042 Chronic combined systolic (congestive) and diastolic (congestive) heart failure: Secondary | ICD-10-CM

## 2014-09-23 DIAGNOSIS — I428 Other cardiomyopathies: Secondary | ICD-10-CM

## 2014-09-23 DIAGNOSIS — I214 Non-ST elevation (NSTEMI) myocardial infarction: Secondary | ICD-10-CM

## 2014-09-23 DIAGNOSIS — I429 Cardiomyopathy, unspecified: Secondary | ICD-10-CM

## 2014-09-23 DIAGNOSIS — R079 Chest pain, unspecified: Secondary | ICD-10-CM

## 2014-09-23 NOTE — Patient Instructions (Signed)
Your physician recommends that you schedule a follow-up appointment in: one month with Richardson Dopp PA  Take lasix 20 mg next two days

## 2014-09-23 NOTE — Progress Notes (Signed)
HPI The patient presents for followup of his nonischemic cardiomyopathy. She's had a negative stress perfusion study. Her EF is 20-25% probably related to hypertension. She has renal insufficiency. She has seen Mr. Kathlen Mody PAc recently and was doing well. She's had her meds titrated her blood pressure better controlled. Her creatinine has actually been stable.  She does have some y her weight has slowly gone up a little bit although it hasn't jumped up by a threshold 3 pounds at which point she was going to take her diuretic. She actually does not take it daily. She denies any new shortness of breath, PND or orthopnea. He's had no new palpitations, presyncope or syncope.  Allergies  Allergen Reactions  . Hydralazine Swelling  . Other Other (See Comments)    Unknown blood pressure medication caused face swelling    Current Outpatient Prescriptions  Medication Sig Dispense Refill  . acetaminophen (TYLENOL) 500 MG tablet Take 1,000 mg by mouth every 6 (six) hours as needed. For pain      . amLODipine (NORVASC) 10 MG tablet Take 1 tablet (10 mg total) by mouth daily.  30 tablet  12  . aspirin EC 81 MG EC tablet Take 1 tablet (81 mg total) by mouth daily.      Marland Kitchen atorvastatin (LIPITOR) 40 MG tablet Take 1 tablet (40 mg total) by mouth daily at 6 PM.  30 tablet  12  . cloNIDine (CATAPRES) 0.2 MG tablet Take 1 tablet (0.2 mg total) by mouth 2 (two) times daily.  60 tablet  6  . furosemide (LASIX) 20 MG tablet Take 20 mg by mouth as needed for edema (with a weight gain of 3lbs call Dr. for permission to take).      . isosorbide mononitrate (IMDUR) 120 MG 24 hr tablet Take 1 tablet (120 mg total) by mouth daily.  30 tablet  12  . metoprolol (LOPRESSOR) 50 MG tablet Take 1 tablet (50 mg total) by mouth 2 (two) times daily.  30 tablet  11   No current facility-administered medications for this visit.    Past Medical History  Diagnosis Date  . Diverticular disease   . Hypertension     a. Renal  Artery Duplex (8/15):  no RAS    No past surgical history on file.  ROS:  As stated in the HPI and negative for all other systems.  PHYSICAL EXAM BP 164/90  Pulse 49  Ht 4\' 10"  (1.473 m)  Wt 159 lb (72.122 kg)  BMI 33.24 kg/m2 GENERAL:  Well appearing HEENT:  Pupils equal round and reactive, fundi not visualized, oral mucosa unremarkable NECK:  No jugular venous distention, waveform within normal limits, carotid upstroke brisk and symmetric, no bruits, no thyromegaly LYMPHATICS:  No cervical, inguinal adenopathy LUNGS:  Clear to auscultation bilaterally BACK:  No CVA tenderness CHEST:  Unremarkable HEART:  PMI not displaced or sustained,S1 and S2 within normal limits, no S3, no S4, no clicks, no rubs, no murmurs ABD:  Flat, positive bowel sounds normal in frequency in pitch, no bruits, no rebound, no guarding, no midline pulsatile mass, no hepatomegaly, no splenomegaly EXT:  2 plus pulses throughout, mild/mod ankle edema, no cyanosis no clubbing SKIN:  No rashes no nodules NEURO:  Cranial nerves II through XII grossly intact, motor grossly intact throughout PSYCH:  Cognitively intact, oriented to person place and time  EKG:  Sinus bradycardia with sinus arrhythmia, left axis deviation, left into the hypertrophy by voltage criteria, repolarization changes, no  change from previous.  09/23/2014   ASSESSMENT AND PLAN  CARDIOMYOPATHY:  Presumed nonischemic.  For now we will not attempt further med titration as she has been very sensitive to medications. Her blood pressure is better controlled as below. I will however give her 20 mg of Lasix for the next 2 days. We discussed more liberal when necessary dosing in the need for careful followup of her basic metabolic profiles if she starts taking diuretic more frequently. She appears to be avoiding salt.  HTN:  Her blood pressure is reasonably controlled compared to her usual. She says it's higher today because of the traffic. For now she  will continue the meds as listed.  CKD:  Her creatinine checked a couple of weeks ago was down to 1.7 which is better than previous. We will keep a close eye on this.

## 2014-10-18 ENCOUNTER — Telehealth: Payer: Self-pay | Admitting: Physician Assistant

## 2014-10-18 NOTE — Telephone Encounter (Signed)
Walk in pt Form " Sealed Envelope" Dropped Off [name] back 11/4

## 2014-10-21 ENCOUNTER — Telehealth: Payer: Self-pay | Admitting: Physician Assistant

## 2014-10-21 NOTE — Telephone Encounter (Signed)
Paperwork located and taken to [employer], [place] to review and complete if necessary.

## 2014-10-21 NOTE — Telephone Encounter (Signed)
New Message       Pt calling stating that she dropped off some time sensitive paperwork for Richardson Dopp to fill out on 10/18/14 and wants to know if they are ready for her to pick up. Please call pt back and advise.

## 2014-10-22 NOTE — Telephone Encounter (Signed)
Spoke with patient and advised papers not completed yet, will call when ready for pick up Confirmed [name] PA does have papers.

## 2014-10-22 NOTE — Telephone Encounter (Signed)
Follow up     Pt calling to see if the forms are completed.  She need to pick them up asap because they are time sensitive and need to be returned to her employer.  Please call when ready to pick up

## 2014-10-24 ENCOUNTER — Ambulatory Visit: Payer: Managed Care, Other (non HMO) | Admitting: Physician Assistant

## 2014-10-24 NOTE — Telephone Encounter (Signed)
Scott please see notes below, please advise       Earnestine Mealing at 10/24/2014 2:59 PM     Status: Signed       Expand All Collapse All   Follow up  Calling to get status on papers to be completed by Nicki Reaper for her job. Please call            Earvin Hansen at 10/22/2014 10:39 AM     Status: Signed       Expand All Collapse All   Spoke with patient and advised papers not completed yet, will call when ready for pick up Confirmed [name] PA does have papers.             Chauncey Reading Price at 10/22/2014 8:05 AM     Status: Signed       Expand All Collapse All   Follow up  Pt calling to see if the forms are completed. She need to pick them up asap because they are time sensitive and need to be returned to her employer. Please call when ready to pick up             Ellwood Dense, RN at 10/21/2014 2:06 PM     Status: Signed       Expand All Collapse All   Paperwork located and taken to [employer], [place] to review and complete if necessary.             Marvia Pickles Respus at 10/21/2014 1:24 PM     Status: Signed       Expand All Collapse All   New Message  Pt calling stating that she dropped off some time sensitive paperwork for Richardson Dopp to fill out on 10/18/14 and wants to know if they are ready for her to pick up. Please call pt back and advise.

## 2014-10-24 NOTE — Telephone Encounter (Signed)
Follow up      Calling to get status on papers to be completed by [name] for her job. Please call

## 2014-10-25 ENCOUNTER — Telehealth: Payer: Self-pay | Admitting: Physician Assistant

## 2014-10-25 NOTE — Telephone Encounter (Signed)
Spoke With [name] made her aware Richardson Dopp has paper Work it has Not been completed, [name] stated it Needs to Be completed by Next Tuesday and faxed in to  [employer] I will make Arbie Cookey aware. Questions call [name] @  [phone]

## 2014-10-30 NOTE — Telephone Encounter (Signed)
Done Richardson Dopp, PA-C   10/30/2014 8:50 AM

## 2014-11-05 ENCOUNTER — Telehealth: Payer: Self-pay | Admitting: Physician Assistant

## 2014-11-05 NOTE — Telephone Encounter (Signed)
Spoke with pt and gave her instructions from Dr. Percival Spanish

## 2014-11-05 NOTE — Telephone Encounter (Signed)
Has gained 3 lbs over night, has to get permission to take fluid pills due to problem with her kidneys, pls advise [phone]

## 2014-11-05 NOTE — Telephone Encounter (Signed)
Spoke with pt. She reports the following weights Today-158 lbs 11/16-156 lbs 11/15-155 lbs 11/14-156 lbs Prior to this weight was stable at 155-156 lbs. She reports ankles and stomach are swollen. Shortness of breath slightly worse than usual.  Reports she feels the way she felt when she saw Dr. Percival Spanish on September 23, 2014.  Reports she has used furosemide one time since office visit on October 5.  Takes furosemide prn but needs to check with MD prior to taking.  She is calling to see if OK to take.  Will forward to Dr. Percival Spanish.

## 2014-11-05 NOTE — Telephone Encounter (Signed)
Phone note came to me which should had been sent to Triage ; stating pt has gained 3 lb over night, states has to get permission to take fluid pills due to problems with kidneys, pls advise [phone]

## 2014-11-05 NOTE — Telephone Encounter (Signed)
She can take 20 mg of Lasix for the next two days.  Please explain to her that if the weight goes up 3 lbs in one day she can take a Lasix without calling us

## 2014-11-11 ENCOUNTER — Encounter: Payer: Self-pay | Admitting: Physician Assistant

## 2014-11-11 ENCOUNTER — Ambulatory Visit (INDEPENDENT_AMBULATORY_CARE_PROVIDER_SITE_OTHER): Payer: Managed Care, Other (non HMO) | Admitting: Physician Assistant

## 2014-11-11 VITALS — BP 150/96 | HR 51 | Ht <= 58 in | Wt 166.0 lb

## 2014-11-11 DIAGNOSIS — I110 Hypertensive heart disease with heart failure: Secondary | ICD-10-CM

## 2014-11-11 DIAGNOSIS — I5042 Chronic combined systolic (congestive) and diastolic (congestive) heart failure: Secondary | ICD-10-CM

## 2014-11-11 DIAGNOSIS — N189 Chronic kidney disease, unspecified: Secondary | ICD-10-CM

## 2014-11-11 DIAGNOSIS — I429 Cardiomyopathy, unspecified: Secondary | ICD-10-CM

## 2014-11-11 DIAGNOSIS — I509 Heart failure, unspecified: Secondary | ICD-10-CM

## 2014-11-11 DIAGNOSIS — I428 Other cardiomyopathies: Secondary | ICD-10-CM

## 2014-11-11 MED ORDER — FUROSEMIDE 20 MG PO TABS: ORAL_TABLET | ORAL | Status: DC

## 2014-11-11 NOTE — Patient Instructions (Addendum)
Take Lasix (furosemide) 20 mg today and again tomorrow.  Then, change Lasix to every Monday, Wednesday and Friday. Weigh daily.  If your weight goes up 3 lbs in one day or 5 lbs in one week, you can take an extra Lasix.  Call if you have questions or if you are not responding to the Lasix.  Keep track of your blood pressure.  Bring in a list of your blood pressures to your next appointment. Bring your home BP machine to your next appointment.  Schedule a follow up appointment with Richardson Dopp, PA-C in 12/03/14 @ 8:30  Labs today:  BMET. Labs in 7-10 days:  BMET. 11/18/14

## 2014-11-11 NOTE — Progress Notes (Signed)
Cardiology Office Note   Date:  11/11/2014   ID:  [name], DOB [birth year], MRN [id number]  PCP:  No PCP Per Patient  Cardiologist:   Dr. Minus Breeding    History of Present Illness: [name] is a 58 y.o. female with a long hx of uncontrolled HTN (poor adherence due to cost and adverse effects), tobacco abuse.  She was admitted 06/2014 with acute combined systolic and diastolic CHF and a/c renal failure (peak Cr 2.15) in the setting of hypertensive urgency with associated elevated in troponin (peak 0.49).   Echo demonstrated reduced LV function with EF 20-25%.  ACEI was not started due to CKD.  Patient noted hx of true allergy to Hydralazine.  She was placed on Coreg and Isosorbide.  Coreg was switched to Metoprolol.  She underwent noninvasive testing with Lexiscan Myoview instead of L heart cath due to CKD.  Myoview demonstrated no scar or ischemia.  Elevated troponin was felt to be from demand ischemia.  Etiology of cardiomyopathy was felt to be non-ischemic and probably related to uncontrolled HTN.    I saw her in FU 07/2014.  Renal artery [place] was neg for RAS.  She saw Dr. Minus Breeding last month.  No further changes were made aside from prn Lasix.  She returns for FU.  She notes worsening edema and dyspnea several days after taking lasix prn.  She describes NYHA 3 symptoms. She denies chest pain. She denies syncope. She sleeps on one pillow. She denies PND. She does note increasing ankle edema. She notes increasing abdominal girth.   Studies:  - Echo (06/2014):  Severe LVH, EF 20-25%, no RWMA, Gr 3 DD, trivial MR, mild LAE, mild RVE, PASP 38 mmHg.  - Nuclear (06/2014):  EF 32%, apical thinning, no definite scar or ischemia   Recent Labs/Images: 07/08/2014: HDL Cholesterol by NMR 43; LDL (calc) 96; TSH 4.870* 07/12/2014: Hemoglobin 12.7 09/02/2014: Creatinine 1.7*; Potassium 4.5; Pro B Natriuretic peptide (BNP) 21.0   Wt Readings from Last 3 Encounters:  11/11/14 166 lb (75.297 kg)    09/23/14 159 lb (72.122 kg)  09/02/14 155 lb (70.308 kg)     Past Medical History  Diagnosis Date  . Diverticular disease   . Hypertension     a. Renal Artery Duplex (8/15):  no RAS    Current Outpatient Prescriptions  Medication Sig Dispense Refill  . acetaminophen (TYLENOL) 500 MG tablet Take 1,000 mg by mouth every 6 (six) hours as needed. For pain    . amLODipine (NORVASC) 10 MG tablet Take 1 tablet (10 mg total) by mouth daily. 30 tablet 12  . aspirin EC 81 MG EC tablet Take 1 tablet (81 mg total) by mouth daily.    Marland Kitchen atorvastatin (LIPITOR) 40 MG tablet Take 1 tablet (40 mg total) by mouth daily at 6 PM. 30 tablet 12  . cloNIDine (CATAPRES) 0.2 MG tablet Take 1 tablet (0.2 mg total) by mouth 2 (two) times daily. 60 tablet 6  . furosemide (LASIX) 20 MG tablet Take 20 mg by mouth as needed for edema (with a weight gain of 3lbs call Dr. for permission to take).    . isosorbide mononitrate (IMDUR) 120 MG 24 hr tablet Take 1 tablet (120 mg total) by mouth daily. 30 tablet 12  . metoprolol (LOPRESSOR) 50 MG tablet Take 1 tablet (50 mg total) by mouth 2 (two) times daily. 30 tablet 11   No current facility-administered medications for this visit.     Allergies:  Hydralazine and Other   Social History:  The patient  reports that she has been smoking.  She does not have any smokeless tobacco history on file. She reports that she does not drink alcohol or use illicit drugs.   Family History:  The patient's family history includes Coronary artery disease in an other family member; Heart attack in her paternal grandfather; Hypertension in her paternal grandmother and other family members; Stroke in her paternal grandmother.   ROS:  Please see the history of present illness.       All other systems reviewed and negative.   PHYSICAL EXAM: VS:  BP 150/96 mmHg  Pulse 51  Ht 4\' 10"  (1.473 m)  Wt 166 lb (75.297 kg)  BMI 34.70 kg/m2 Well nourished, well developed, in no acute  distress HEENT: normal Neck: no JVD Cardiac:  normal S1, S2; RRR; no murmur Lungs:  clear to auscultation bilaterally, no wheezing, rhonchi or rales Abd: soft, nontender, no hepatomegaly Ext: trace bilateral ankle edema- compression stockings in place Skin: warm and dry Neuro:  CNs 2-12 intact, no focal abnormalities noted  EKG:  Sinus bradycardia, HR 51, normal axis, LVH with repolarization abnormality, no change from prior tracing   ASSESSMENT AND PLAN:  1.  Hypertensive heart disease with heart failure:  Her blood pressure remains elevated. However, her weights have increased recently and she has some symptoms consistent with volume excess. I will initially diurese her. We will then need to make a decision regarding whether to increase her clonidine or try placing her on an ACE inhibitor. 2.  CHRONIC COMBINED SYSTOLIC AND DIASTOLIC CHF:  She has some evidence of volume excess.     - I will give her Lasix 20 mg today and again tomorrow.     - I will then place her on Lasix 3 times a week.     - Check a BMET today.     - Repeat BMET in 7-10 days. 3.  NICM (nonischemic cardiomyopathy):  Likely related to HTN.  Continue beta blocker, nitrates.  She is not on ACEI/ARB 2/2 CKD.  She has a true allergy to Hydralazine.  Consider FU echo once BP controlled.    3.  CKD (chronic kidney disease), unspecified stage:  Keep a close eye on renal function and potassium with adjustments in diuretics as outlined above.  Disposition:  FU with me in 3-4 weeks.   Signed, Versie Starks, MHS 11/11/2014 12:31 PM    [facility] [address], [place], [place]  [address] Phone: [phone]; Fax: [phone]

## 2014-11-12 LAB — BASIC METABOLIC PANEL
BUN: 36 mg/dL — ABNORMAL HIGH (ref 6–23)
CO2: 26 mEq/L (ref 19–32)
Calcium: 9.7 mg/dL (ref 8.4–10.5)
Chloride: 103 mEq/L (ref 96–112)
Creatinine, Ser: 1.7 mg/dL — ABNORMAL HIGH (ref 0.4–1.2)
GFR: 31.94 mL/min — ABNORMAL LOW (ref >60.00)
Glucose, Bld: 90 mg/dL (ref 70–99)
Potassium: 4.8 mEq/L (ref 3.5–5.1)
Sodium: 139 mEq/L (ref 135–145)

## 2014-11-18 ENCOUNTER — Other Ambulatory Visit (INDEPENDENT_AMBULATORY_CARE_PROVIDER_SITE_OTHER): Payer: Managed Care, Other (non HMO) | Admitting: *Deleted

## 2014-11-18 DIAGNOSIS — N189 Chronic kidney disease, unspecified: Secondary | ICD-10-CM

## 2014-11-18 DIAGNOSIS — I509 Heart failure, unspecified: Secondary | ICD-10-CM

## 2014-11-18 DIAGNOSIS — I110 Hypertensive heart disease with heart failure: Secondary | ICD-10-CM

## 2014-11-18 LAB — BASIC METABOLIC PANEL
BUN: 27 mg/dL — ABNORMAL HIGH (ref 6–23)
CO2: 25 mEq/L (ref 19–32)
Calcium: 8.9 mg/dL (ref 8.4–10.5)
Chloride: 101 mEq/L (ref 96–112)
Creatinine, Ser: 1.6 mg/dL — ABNORMAL HIGH (ref 0.4–1.2)
GFR: 35.72 mL/min — ABNORMAL LOW (ref >60.00)
Glucose, Bld: 113 mg/dL — ABNORMAL HIGH (ref 70–99)
Potassium: 4.4 mEq/L (ref 3.5–5.1)
Sodium: 136 mEq/L (ref 135–145)

## 2014-11-19 ENCOUNTER — Telehealth: Payer: Self-pay | Admitting: *Deleted

## 2014-11-19 NOTE — Telephone Encounter (Signed)
pt notified about lab results with verbal understanding  

## 2014-12-03 ENCOUNTER — Encounter: Payer: Self-pay | Admitting: Physician Assistant

## 2014-12-03 ENCOUNTER — Ambulatory Visit (INDEPENDENT_AMBULATORY_CARE_PROVIDER_SITE_OTHER): Payer: Managed Care, Other (non HMO) | Admitting: Physician Assistant

## 2014-12-03 VITALS — BP 141/70 | HR 62 | Ht <= 58 in | Wt 172.0 lb

## 2014-12-03 DIAGNOSIS — I428 Other cardiomyopathies: Secondary | ICD-10-CM

## 2014-12-03 DIAGNOSIS — N189 Chronic kidney disease, unspecified: Secondary | ICD-10-CM

## 2014-12-03 DIAGNOSIS — I5042 Chronic combined systolic (congestive) and diastolic (congestive) heart failure: Secondary | ICD-10-CM

## 2014-12-03 DIAGNOSIS — I429 Cardiomyopathy, unspecified: Secondary | ICD-10-CM

## 2014-12-03 DIAGNOSIS — I509 Heart failure, unspecified: Secondary | ICD-10-CM

## 2014-12-03 DIAGNOSIS — I110 Hypertensive heart disease with heart failure: Secondary | ICD-10-CM

## 2014-12-03 LAB — BASIC METABOLIC PANEL
BUN: 37 mg/dL — ABNORMAL HIGH (ref 6–23)
CO2: 26 mEq/L (ref 19–32)
Calcium: 9.3 mg/dL (ref 8.4–10.5)
Chloride: 105 mEq/L (ref 96–112)
Creatinine, Ser: 1.6 mg/dL — ABNORMAL HIGH (ref 0.4–1.2)
GFR: 35.98 mL/min — ABNORMAL LOW (ref >60.00)
Glucose, Bld: 115 mg/dL — ABNORMAL HIGH (ref 70–99)
Potassium: 4.3 mEq/L (ref 3.5–5.1)
Sodium: 137 mEq/L (ref 135–145)

## 2014-12-03 LAB — BRAIN NATRIURETIC PEPTIDE: Pro B Natriuretic peptide (BNP): 87 pg/mL (ref 0.0–100.0)

## 2014-12-03 MED ORDER — FUROSEMIDE 20 MG PO TABS: ORAL_TABLET | ORAL | Status: DC

## 2014-12-03 NOTE — Patient Instructions (Signed)
INCREASE LASIX 20 MG DAILY; NEW RX SENT IN FOR REFILLS WITH NEW DIRECTIONS  LAB WORK TODAY; BMET, BNP  REPEAT BMET 12/10/14 DUE TO LASIX INCREASE  FOLLOW UP WITH SCOTT WEAVER, [facility] ON 01/07/15 @ 8:50  CALL WITH BLOOD PRESSURE READINGS AFTER TAKING THE LASIX DAILY FOR 2 WEEKS [id number]

## 2014-12-03 NOTE — Progress Notes (Signed)
Cardiology Office Note   Date:  12/03/2014   ID:  [name], DOB March 17, 1956, MRN [id number]  PCP:  No PCP Per Patient  Cardiologist:   Dr. Minus Breeding    History of Present Illness: [name] is a 58 y.o. female with a long hx of uncontrolled HTN (poor adherence due to cost and adverse effects), tobacco abuse.  She was admitted 06/2014 with acute combined systolic and diastolic CHF and a/c renal failure (peak Cr 2.15) in the setting of hypertensive urgency with associated elevated in troponin (peak 0.49).   Echo demonstrated reduced LV function with EF 20-25%.  ACEI was not started due to CKD.  Patient noted hx of true allergy to Hydralazine.  She was placed on Coreg and Isosorbide.  Coreg was switched to Metoprolol.  She underwent noninvasive testing with Lexiscan Myoview instead of L heart cath due to CKD.  Myoview demonstrated no scar or ischemia.  Elevated troponin was felt to be from demand ischemia.  Etiology of cardiomyopathy was felt to be non-ischemic and probably related to uncontrolled HTN.    Renal artery [place] 8/15 was neg for RAS.  Last seen by me 11/19/14.  She had worsening edema nd dyspnea.  BP remained uncontrolled. I adjusted her Lasix.  She returns for FU.  She is overall doing well. She brings a list of her blood pressures today. They're doing very well. Her range is 118/64-174/97. I checked her machine today and it is consistent with the mercury cuff. She denies chest pain. She does note increasing dyspnea with exertion when she feels overloaded with fluid. She describes NYHA 3 symptoms. Otherwise, when she feels that her fluid is improved she notes NYHA 2-2b symptoms. She denies syncope. She sleeps on 2 pillows chronically. She denies PND. She does note increased abnormal girth and LE edema.   Studies:  - Echo (06/2014):  Severe LVH, EF 20-25%, no RWMA, Gr 3 DD, trivial MR, mild LAE, mild RVE, PASP 38 mmHg.  - Nuclear (06/2014):  EF 32%, apical thinning, no definite scar or  ischemia   Recent Labs/Images: 07/08/2014: HDL Cholesterol by NMR 43; LDL (calc) 96; TSH 4.870* 07/12/2014: Hemoglobin 12.7 09/02/2014: Pro B Natriuretic peptide (BNP) 21.0 11/18/2014: Creatinine 1.6*; Potassium 4.4   Wt Readings from Last 3 Encounters:  12/03/14 172 lb (78.019 kg)  11/11/14 166 lb (75.297 kg)  09/23/14 159 lb (72.122 kg)     Past Medical History  Diagnosis Date  . Diverticular disease   . Hypertension     a. Renal Artery Duplex (8/15):  no RAS    Current Outpatient Prescriptions  Medication Sig Dispense Refill  . acetaminophen (TYLENOL) 500 MG tablet Take 1,000 mg by mouth every 6 (six) hours as needed. For pain    . amLODipine (NORVASC) 10 MG tablet Take 1 tablet (10 mg total) by mouth daily. 30 tablet 12  . aspirin EC 81 MG EC tablet Take 1 tablet (81 mg total) by mouth daily.    Marland Kitchen atorvastatin (LIPITOR) 40 MG tablet Take 1 tablet (40 mg total) by mouth daily at 6 PM. 30 tablet 12  . cloNIDine (CATAPRES) 0.2 MG tablet Take 1 tablet (0.2 mg total) by mouth 2 (two) times daily. 60 tablet 6  . furosemide (LASIX) 20 MG tablet Take one tablet on Monday, Wednesday, Friday every week and as directed. 30 tablet 6  . isosorbide mononitrate (IMDUR) 120 MG 24 hr tablet Take 1 tablet (120 mg total) by mouth daily. 30 tablet  12  . metoprolol (LOPRESSOR) 50 MG tablet Take 1 tablet (50 mg total) by mouth 2 (two) times daily. 30 tablet 11   No current facility-administered medications for this visit.     Allergies:   Hydralazine and Other   Social History:  The patient  reports that she has been smoking.  She does not have any smokeless tobacco history on file. She reports that she does not drink alcohol or use illicit drugs.   Family History:  The patient's family history includes Coronary artery disease in an other family member; Heart attack in her paternal grandfather; Hypertension in her paternal grandmother and other family members; Stroke in her paternal grandmother.    ROS:  Please see the history of present illness.      All other systems reviewed and negative.   PHYSICAL EXAM: VS:  BP 141/70 mmHg  Pulse 62  Ht 4\' 10"  (1.473 m)  Wt 172 lb (78.019 kg)  BMI 35.96 kg/m2  SpO2 98% Well nourished, well developed, in no acute distress HEENT: normal Neck: no JVD Cardiac:  normal S1, S2; RRR; no murmur Lungs:  clear to auscultation bilaterally, no wheezing, rhonchi or rales Abd: soft, nontender, no hepatomegaly Ext: trace bilateral ankle edema- compression stockings in place Skin: warm and dry Neuro:  CNs 2-12 intact, no focal abnormalities noted   ASSESSMENT AND PLAN:  1.  Hypertensive heart disease with heart failure:  Blood pressure is much improved. Home readings are very close to target.     -  Continue Amlodipine, clonidine, isosorbide, metoprolol. 2.  CHRONIC COMBINED SYSTOLIC AND DIASTOLIC CHF:  She continues to have issues with volume excess. Her baseline weight at home appears to be about 160 pounds. Recently she's gone up as high as 169. She feels better when she takes Lasix.     -  Increase Lasix to 20 mg every day.     -  BMET, BNP today. Repeat BMET in one week.  3.  NICM (nonischemic cardiomyopathy):  Likely related to HTN.  Continue beta blocker, nitrates.  She is not on ACEI/ARB 2/2 CKD.  She has a true allergy to Hydralazine.  Consider FU echo once BP controlled. Depending upon her blood pressures and creatinine over the next several weeks, I will consider placing her on low-dose lisinopril.  3.  CKD (chronic kidney disease), unspecified stage: Keep a close eye on renal function and potassium with adjustments in her diuretics. Addition of ACE inhibitor would likely be beneficial if she can tolerate this.  Disposition:  FU with me 1 month.   Signed, Versie Starks, MHS 12/03/2014 8:54 AM    [facility] [address], [place], [place]  [address] Phone: [phone]; Fax: [phone]

## 2014-12-04 ENCOUNTER — Telehealth: Payer: Self-pay | Admitting: *Deleted

## 2014-12-04 NOTE — Telephone Encounter (Signed)
Pt notified about lab results with verbal understanding to results given today.

## 2014-12-10 ENCOUNTER — Other Ambulatory Visit (INDEPENDENT_AMBULATORY_CARE_PROVIDER_SITE_OTHER): Payer: Managed Care, Other (non HMO) | Admitting: *Deleted

## 2014-12-10 DIAGNOSIS — N189 Chronic kidney disease, unspecified: Secondary | ICD-10-CM

## 2014-12-10 LAB — BASIC METABOLIC PANEL
BUN: 40 mg/dL — ABNORMAL HIGH (ref 6–23)
CO2: 26 mEq/L (ref 19–32)
Calcium: 8.9 mg/dL (ref 8.4–10.5)
Chloride: 105 mEq/L (ref 96–112)
Creatinine, Ser: 1.9 mg/dL — ABNORMAL HIGH (ref 0.4–1.2)
GFR: 29.01 mL/min — ABNORMAL LOW (ref >60.00)
Glucose, Bld: 123 mg/dL — ABNORMAL HIGH (ref 70–99)
Potassium: 4.1 mEq/L (ref 3.5–5.1)
Sodium: 138 mEq/L (ref 135–145)

## 2014-12-11 ENCOUNTER — Telehealth: Payer: Self-pay | Admitting: *Deleted

## 2014-12-11 DIAGNOSIS — N189 Chronic kidney disease, unspecified: Secondary | ICD-10-CM

## 2014-12-11 NOTE — Telephone Encounter (Signed)
Pt notified about lab results and with repeat bmet 12/27/14, which at that time pt will bring her BP readings for [employer]. PA to review. Pt aware of 1/25 8:50 f/u w/Scott W. PA.

## 2014-12-27 ENCOUNTER — Telehealth: Payer: Self-pay | Admitting: *Deleted

## 2014-12-27 ENCOUNTER — Telehealth: Payer: Self-pay | Admitting: Physician Assistant

## 2014-12-27 ENCOUNTER — Other Ambulatory Visit (INDEPENDENT_AMBULATORY_CARE_PROVIDER_SITE_OTHER): Payer: Managed Care, Other (non HMO) | Admitting: *Deleted

## 2014-12-27 DIAGNOSIS — N189 Chronic kidney disease, unspecified: Secondary | ICD-10-CM

## 2014-12-27 DIAGNOSIS — I10 Essential (primary) hypertension: Secondary | ICD-10-CM

## 2014-12-27 LAB — BASIC METABOLIC PANEL
BUN: 44 mg/dL — ABNORMAL HIGH (ref 6–23)
CO2: 27 mEq/L (ref 19–32)
Calcium: 9.4 mg/dL (ref 8.4–10.5)
Chloride: 102 mEq/L (ref 96–112)
Creatinine, Ser: 2 mg/dL — ABNORMAL HIGH (ref 0.4–1.2)
GFR: 27.64 mL/min — ABNORMAL LOW (ref >60.00)
Glucose, Bld: 93 mg/dL (ref 70–99)
Potassium: 4.8 mEq/L (ref 3.5–5.1)
Sodium: 138 mEq/L (ref 135–145)

## 2014-12-27 MED ORDER — TERAZOSIN HCL 1 MG PO CAPS: 1.0000 mg | ORAL_CAPSULE | Freq: Every day | ORAL | Status: DC

## 2014-12-27 NOTE — Telephone Encounter (Signed)
Pt notified to start Hytrin 1 mg QHS, Rx sent in

## 2014-12-27 NOTE — Telephone Encounter (Signed)
Walk in pt form " BP readings" dropped off gave to [facility]

## 2014-12-27 NOTE — Telephone Encounter (Signed)
pt notified about lab results as well as her BP readings that [name]. PA reviewed today. Per [place] BP still borderline elevated. Pt will start Hytrin 1 mg 1 tab QHS, bmet 1/21, f/u w/PA 1/25. Pt verbalized understanding to Plan of Care.

## 2014-12-27 NOTE — Telephone Encounter (Signed)
Reviewed BP readings. She is still borderline elevated. I have talked to her before about using an ACE-inhibitor.  But, I do not think I can use that with her renal function.  Start Hytrin 1 mg QHS.  Ask her if she has ever used this before.  FU as planned. Richardson Dopp, PA-C   12/27/2014 2:37 PM

## 2015-01-07 ENCOUNTER — Ambulatory Visit: Payer: Managed Care, Other (non HMO) | Admitting: Physician Assistant

## 2015-01-09 ENCOUNTER — Other Ambulatory Visit (INDEPENDENT_AMBULATORY_CARE_PROVIDER_SITE_OTHER): Payer: Managed Care, Other (non HMO) | Admitting: *Deleted

## 2015-01-09 ENCOUNTER — Telehealth: Payer: Self-pay | Admitting: *Deleted

## 2015-01-09 DIAGNOSIS — I10 Essential (primary) hypertension: Secondary | ICD-10-CM

## 2015-01-09 LAB — BASIC METABOLIC PANEL
BUN: 50 mg/dL — ABNORMAL HIGH (ref 6–23)
CO2: 28 mEq/L (ref 19–32)
Calcium: 9.1 mg/dL (ref 8.4–10.5)
Chloride: 105 mEq/L (ref 96–112)
Creatinine, Ser: 1.85 mg/dL — ABNORMAL HIGH (ref 0.40–1.20)
GFR: 29.54 mL/min — ABNORMAL LOW (ref >60.00)
Glucose, Bld: 137 mg/dL — ABNORMAL HIGH (ref 70–99)
Potassium: 4.4 mEq/L (ref 3.5–5.1)
Sodium: 139 mEq/L (ref 135–145)

## 2015-01-09 NOTE — Telephone Encounter (Signed)
I have called this pt. And left a message to call me back so I can talk to her about her FMLA form

## 2015-01-13 ENCOUNTER — Ambulatory Visit: Payer: Managed Care, Other (non HMO) | Admitting: Physician Assistant

## 2015-01-22 ENCOUNTER — Encounter: Payer: Self-pay | Admitting: Physician Assistant

## 2015-01-22 ENCOUNTER — Ambulatory Visit (INDEPENDENT_AMBULATORY_CARE_PROVIDER_SITE_OTHER): Payer: Managed Care, Other (non HMO) | Admitting: Physician Assistant

## 2015-01-22 VITALS — BP 140/82 | HR 52 | Ht <= 58 in | Wt 178.1 lb

## 2015-01-22 DIAGNOSIS — E785 Hyperlipidemia, unspecified: Secondary | ICD-10-CM

## 2015-01-22 DIAGNOSIS — I509 Heart failure, unspecified: Secondary | ICD-10-CM

## 2015-01-22 DIAGNOSIS — N189 Chronic kidney disease, unspecified: Secondary | ICD-10-CM

## 2015-01-22 DIAGNOSIS — I110 Hypertensive heart disease with heart failure: Secondary | ICD-10-CM

## 2015-01-22 DIAGNOSIS — I5042 Chronic combined systolic (congestive) and diastolic (congestive) heart failure: Secondary | ICD-10-CM

## 2015-01-22 DIAGNOSIS — I429 Cardiomyopathy, unspecified: Secondary | ICD-10-CM

## 2015-01-22 NOTE — Progress Notes (Signed)
Cardiology Office Note   Date:  01/22/2015   ID:  [name], DOB May 23, 1956, MRN [id number]  PCP:  No PCP Per Patient  Cardiologist:  Dr. Minus Breeding     Chief Complaint  Patient presents with  . Hypertension     History of Present Illness: [name] is a 59 y.o. female with a a long hx of uncontrolled HTN (poor adherence due to cost and adverse effects), tobacco abuse. She was admitted 06/2014 with acute combined systolic and diastolic CHF and a/c renal failure (peak Cr 2.15) in the setting of hypertensive urgency with associated elevated in troponin (peak 0.49). Echo demonstrated reduced LV function with EF 20-25%. ACEI was not started due to CKD. Patient noted hx of true allergy to Hydralazine. She was placed on Coreg and Isosorbide. Coreg was switched to Metoprolol. She underwent noninvasive testing with Lexiscan Myoview instead of L heart cath due to CKD. Myoview demonstrated no scar or ischemia. Elevated troponin was felt to be from demand ischemia. Etiology of cardiomyopathy was felt to be non-ischemic and probably related to uncontrolled HTN.   Renal artery [place] 8/15 was neg for RAS.   Last seen 12/03/14. She returns for follow-up.  Since last seen, she remains stable. She brings a list of her blood pressures from home. Basically, her blood pressures are at target. Her systolic readings range 0000000.  She remains short of breath with moderate activity. She is NYHA 2-2b. She sleeps on an incline chronically. She occasionally awakens short of breath. Her LE edema fluctuates. Her left leg is usually greater than the right. She denies syncope or chest pain.   Studies: - Echo (06/2014): Severe LVH, EF 20-25%, no RWMA, Gr 3 DD, trivial MR, mild LAE, mild RVE, PASP 38 mmHg. - Nuclear (06/2014): EF 32%, apical thinning, no definite scar or ischemia   Past Medical History  Diagnosis Date  . Diverticular disease   . Hypertensive heart disease     a. Renal Artery Duplex  (8/15):  no RAS  . Chronic combined systolic and diastolic CHF (congestive heart failure)     Echo (06/2014): Severe LVH, EF 20-25%, no RWMA, Gr 3 DD, trivial MR, mild LAE, mild RVE, PASP 38 mmHg.  Marland Kitchen NICM (nonischemic cardiomyopathy)     Nuclear (06/2014): EF 32%, apical thinning, no definite scar or ischemia  . CKD (chronic kidney disease)     Past Surgical History  Procedure Laterality Date  . None       Current Outpatient Prescriptions  Medication Sig Dispense Refill  . acetaminophen (TYLENOL) 500 MG tablet Take 1,000 mg by mouth every 6 (six) hours as needed. For pain    . amLODipine (NORVASC) 10 MG tablet Take 1 tablet (10 mg total) by mouth daily. 30 tablet 12  . aspirin EC 81 MG EC tablet Take 1 tablet (81 mg total) by mouth daily.    Marland Kitchen atorvastatin (LIPITOR) 40 MG tablet Take 1 tablet (40 mg total) by mouth daily at 6 PM. 30 tablet 12  . cloNIDine (CATAPRES) 0.2 MG tablet Take 1 tablet (0.2 mg total) by mouth 2 (two) times daily. 60 tablet 6  . furosemide (LASIX) 20 MG tablet Take 20 mg every day 30 tablet 6  . isosorbide mononitrate (IMDUR) 120 MG 24 hr tablet Take 1 tablet (120 mg total) by mouth daily. 30 tablet 12  . metoprolol (LOPRESSOR) 50 MG tablet Take 1 tablet (50 mg total) by mouth 2 (two) times daily. 30 tablet 11  .  terazosin (HYTRIN) 1 MG capsule Take 1 capsule (1 mg total) by mouth at bedtime. 30 capsule 11   No current facility-administered medications for this visit.    Allergies:   Hydralazine and Other    Social History:  The patient  reports that she has been smoking.  She does not have any smokeless tobacco history on file. She reports that she does not drink alcohol or use illicit drugs.   Family History:  The patient's family history includes Coronary artery disease in an other family member; Heart attack in her paternal grandfather; Hypertension in her paternal grandmother and other family members; Stroke in her paternal grandmother.    ROS:   Please see the history of present illness.   Otherwise, review of systems are positive for none.   All other systems are reviewed and negative.    PHYSICAL EXAM: VS:  BP 140/82 mmHg  Pulse 52  Ht 4\' 10"  (1.473 m)  Wt 178 lb 1.9 oz (80.795 kg)  BMI 37.24 kg/m2  SpO2 96%    Wt Readings from Last 3 Encounters:  01/22/15 178 lb 1.9 oz (80.795 kg)  12/03/14 172 lb (78.019 kg)  11/11/14 166 lb (75.297 kg)     GEN: Well nourished, well developed, in no acute distress HEENT: normal Neck: no JVD, no masses Cardiac:  Normal S1/S2, RRR; no murmur, no rubs or gallops, trace edema  Respiratory:  clear to auscultation bilaterally, no wheezing, rhonchi or rales. GI: soft, nontender, nondistended, + BS MS: no deformity or atrophy Skin: warm and dry  Neuro:  CNs II-XII intact, Strength and sensation are intact Psych: Normal affect   EKG:  EKG is ordered today.  It demonstrates:   Sinus bradycardia, HR 52, inferolateral T-wave inversions, QTc 463 ms, no change since prior tracing   Recent Labs: 07/08/2014: TSH 4.870* 07/12/2014: Hemoglobin 12.7; Platelets 218 12/03/2014: Pro B Natriuretic peptide (BNP) 87.0 01/09/2015: BUN 50*; Creatinine 1.85*; Potassium 4.4; Sodium 139    Lipid Panel    Component Value Date/Time   CHOL 158 07/08/2014 0300   TRIG 97 07/08/2014 0300   HDL 43 07/08/2014 0300   CHOLHDL 3.7 07/08/2014 0300   VLDL 19 07/08/2014 0300   LDLCALC 96 07/08/2014 0300      ASSESSMENT AND PLAN:  1. Hypertensive heart disease with heart failure: Blood pressure is much improved. She is currently tolerating her current medical regimen.  - Continue Amlodipine, clonidine, isosorbide, metoprolol, Hytrin. 2. Chronic Combined Systolic and Diastolic CHF: Volume is overall stable. I advised her when to take additional Lasix for increased weight or swelling. 3. NICM (nonischemic cardiomyopathy): Likely related to HTN. Continue beta blocker, nitrates. She is not on ACEI/ARB or  spironolactone 2/2 CKD. She has a true allergy to Hydralazine.     -  Arrange follow-up echocardiogram.    -  Refer to EP if EF < 35%.  3. CKD (chronic kidney disease):  Arrange follow-up basic metabolic panel. 4.  Hyperlipidemia: Arrange follow-up lipids and LFTs.   Current medicines are reviewed at length with the patient today.  The patient does not have concerns regarding medicines.  The following changes have been made:  no change  Labs/ tests ordered today include:   Orders Placed This Encounter  Procedures  . Basic Metabolic Panel (BMET)  . Lipid Profile  . Hepatic function panel  . EKG 12-Lead  . 2D Echocardiogram without contrast     Disposition:   FU with Dr. Percival Spanish in 3 months  Signed, Versie Starks, MHS 01/22/2015 2:45 PM    [facility] [place] [address], [place], [place]  [address] Phone: [phone]; Fax: [phone]

## 2015-01-22 NOTE — Patient Instructions (Signed)
Your physician recommends that you schedule a follow-up appointment in: 3 MONTHS WITH DR. [facility]  Your physician has requested that you have an echocardiogram. Echocardiography is a painless test that uses sound waves to create images of your heart. It provides your doctor with information about the size and shape of your heart and how well your heart's chambers and valves are working. This procedure takes approximately one hour. There are no restrictions for this procedure.  Your physician recommends that you return for lab work in: FASTING LIPID AND LIVER PANEL, BMET THIS CAN BE DONE THE SAME DAY YOU COME IN FOR YOUR ECHO ONLY IF ECHO IS SCHEDULED FOR THE MORNING  MONITOR WEIGHT DAILY AND IF WEIGHT GOES UP 3 LB IN 1 DAY OR 5 LB'S IN 1 WEEK OR MORE SHORTNESS OF BREATH OR SWELLING THEN OK TO TAKE EXTRA LASIX 20 MG. CALL THE OFFICE IF YOU HAVE TO TAKE EXTRA LASIX MORE THAN 2-3 TIMES IN 1 WEEK. [phone]

## 2015-01-27 ENCOUNTER — Other Ambulatory Visit (INDEPENDENT_AMBULATORY_CARE_PROVIDER_SITE_OTHER): Payer: Managed Care, Other (non HMO) | Admitting: *Deleted

## 2015-01-27 ENCOUNTER — Encounter: Payer: Self-pay | Admitting: Physician Assistant

## 2015-01-27 ENCOUNTER — Telehealth: Payer: Self-pay | Admitting: *Deleted

## 2015-01-27 ENCOUNTER — Ambulatory Visit (HOSPITAL_COMMUNITY): Payer: Managed Care, Other (non HMO) | Attending: Cardiovascular Disease | Admitting: Radiology

## 2015-01-27 DIAGNOSIS — Z720 Tobacco use: Secondary | ICD-10-CM | POA: Diagnosis not present

## 2015-01-27 DIAGNOSIS — I429 Cardiomyopathy, unspecified: Secondary | ICD-10-CM | POA: Insufficient documentation

## 2015-01-27 DIAGNOSIS — I509 Heart failure, unspecified: Secondary | ICD-10-CM

## 2015-01-27 DIAGNOSIS — I110 Hypertensive heart disease with heart failure: Secondary | ICD-10-CM

## 2015-01-27 LAB — HEPATIC FUNCTION PANEL
ALT: 16 U/L (ref 0–35)
AST: 15 U/L (ref 0–37)
Albumin: 3.8 g/dL (ref 3.5–5.2)
Alkaline Phosphatase: 133 U/L — ABNORMAL HIGH (ref 39–117)
Bilirubin, Direct: 0.1 mg/dL (ref 0.0–0.3)
Total Bilirubin: 0.5 mg/dL (ref 0.2–1.2)
Total Protein: 7.4 g/dL (ref 6.0–8.3)

## 2015-01-27 LAB — LIPID PANEL
Cholesterol: 139 mg/dL (ref 0–200)
HDL: 38.9 mg/dL — ABNORMAL LOW (ref >39.00)
LDL Cholesterol: 70 mg/dL (ref 0–99)
NonHDL: 100.1
Total CHOL/HDL Ratio: 4
Triglycerides: 152 mg/dL — ABNORMAL HIGH (ref 0.0–149.0)
VLDL: 30.4 mg/dL (ref 0.0–40.0)

## 2015-01-27 LAB — BASIC METABOLIC PANEL
BUN: 48 mg/dL — ABNORMAL HIGH (ref 6–23)
CO2: 28 mEq/L (ref 19–32)
Calcium: 9.4 mg/dL (ref 8.4–10.5)
Chloride: 103 mEq/L (ref 96–112)
Creatinine, Ser: 1.86 mg/dL — ABNORMAL HIGH (ref 0.40–1.20)
GFR: 29.35 mL/min — ABNORMAL LOW (ref >60.00)
Glucose, Bld: 121 mg/dL — ABNORMAL HIGH (ref 70–99)
Potassium: 4.3 mEq/L (ref 3.5–5.1)
Sodium: 140 mEq/L (ref 135–145)

## 2015-01-27 NOTE — Telephone Encounter (Signed)
pt notified about echo results and EF back to normal. Pt said thank you for the good news.

## 2015-01-27 NOTE — Telephone Encounter (Signed)
pt notified about lab results with verbal understanding. when I advised pt that she needs to f/u w/PCP about LFT, ALP elevated, pt states she does not have a PCP. I asked who does she see when she has (ie a cold). She states she goes to walk in clinics. I said I will d/w [name]. PA for further advice and cb sometime later this week, pt said ok and thank you.

## 2015-01-27 NOTE — Progress Notes (Signed)
Echocardiogram performed.  

## 2015-01-31 ENCOUNTER — Telehealth: Payer: Self-pay | Admitting: *Deleted

## 2015-01-31 DIAGNOSIS — R7989 Other specified abnormal findings of blood chemistry: Secondary | ICD-10-CM

## 2015-01-31 DIAGNOSIS — R945 Abnormal results of liver function studies: Principal | ICD-10-CM

## 2015-01-31 NOTE — Telephone Encounter (Signed)
pt notified about needing labs further for her lft function, as well as she will be referred to [facility] and our office will call with a date and itme of PCP appt. Pt agreeable. Pt  will have lab on 2/19.

## 2015-02-07 ENCOUNTER — Other Ambulatory Visit (INDEPENDENT_AMBULATORY_CARE_PROVIDER_SITE_OTHER): Payer: Managed Care, Other (non HMO) | Admitting: *Deleted

## 2015-02-07 DIAGNOSIS — R7989 Other specified abnormal findings of blood chemistry: Secondary | ICD-10-CM

## 2015-02-07 DIAGNOSIS — R945 Abnormal results of liver function studies: Principal | ICD-10-CM

## 2015-02-07 LAB — HEPATIC FUNCTION PANEL
ALT: 16 U/L (ref 0–35)
AST: 13 U/L (ref 0–37)
Albumin: 3.7 g/dL (ref 3.5–5.2)
Alkaline Phosphatase: 145 U/L — ABNORMAL HIGH (ref 39–117)
Bilirubin, Direct: 0.1 mg/dL (ref 0.0–0.3)
Total Bilirubin: 0.6 mg/dL (ref 0.2–1.2)
Total Protein: 7.4 g/dL (ref 6.0–8.3)

## 2015-02-07 LAB — GAMMA GT: GGT: 17 U/L (ref 7–51)

## 2015-02-13 LAB — NUCLEOTIDASE, 5', BLOOD: 5-Nucleotidase: 3 U/L (ref <11)

## 2015-02-14 ENCOUNTER — Telehealth: Payer: Self-pay | Admitting: *Deleted

## 2015-02-14 NOTE — Telephone Encounter (Signed)
pt notified about lab results and has been scheduled w/new PCP Mauricio Po, FNP 03/26/15.

## 2015-02-17 ENCOUNTER — Other Ambulatory Visit: Payer: Self-pay

## 2015-02-17 ENCOUNTER — Other Ambulatory Visit: Payer: Self-pay | Admitting: Physician Assistant

## 2015-02-17 MED ORDER — METOPROLOL TARTRATE 50 MG PO TABS: 50.0000 mg | ORAL_TABLET | Freq: Two times a day (BID) | ORAL | Status: DC

## 2015-02-18 ENCOUNTER — Telehealth: Payer: Self-pay | Admitting: Physician Assistant

## 2015-02-18 NOTE — Telephone Encounter (Signed)
New Msg         Pt requesting [name] only.   No details provided.  Please call.

## 2015-02-28 NOTE — Telephone Encounter (Signed)
pt notified of paper work for her job has been completed by [employer]. PA and will be at the front desk for pick up. Pt said ok and thank you.

## 2015-03-26 ENCOUNTER — Ambulatory Visit (INDEPENDENT_AMBULATORY_CARE_PROVIDER_SITE_OTHER): Payer: Managed Care, Other (non HMO) | Admitting: Family

## 2015-03-26 ENCOUNTER — Encounter: Payer: Self-pay | Admitting: Family

## 2015-03-26 VITALS — BP 138/94 | HR 58 | Temp 98.5°F | Resp 18 | Ht 60.0 in | Wt 186.0 lb

## 2015-03-26 DIAGNOSIS — R21 Rash and other nonspecific skin eruption: Secondary | ICD-10-CM | POA: Diagnosis not present

## 2015-03-26 DIAGNOSIS — R748 Abnormal levels of other serum enzymes: Secondary | ICD-10-CM | POA: Diagnosis not present

## 2015-03-26 MED ORDER — SULFAMETHOXAZOLE-TRIMETHOPRIM 800-160 MG PO TABS: 1.0000 | ORAL_TABLET | Freq: Two times a day (BID) | ORAL | Status: DC

## 2015-03-26 NOTE — Progress Notes (Signed)
Subjective:    Patient ID: [name], female    DOB: 02-18-1956, 59 y.o.   MRN: [id number]  Chief Complaint  Patient presents with  . Establish Care    was told that something with her liver was off in blood work, has a hard knots on ankles that wont go away, interfering with walking    HPI:  [name] is a 59 y.o. female who presents today to establish care and discuss recent liver tests.   1) Liver concern - Currently followed by Cardiology for heart failure and there was an increased amount of ALP during evaluation of her CMET. All other liver function testing was within the normal range.   2) Ankles - Associated symptoms of "knots" located on her right  ankle around the lateral malleolous has been going on since the end of October. Modifying factors include lasix to decrease swelling, soaking her ankles, and elevating her ankles has not helped much. The intensity of the pain that she is experiencing with this is enough to alter her functionality.  Allergies  Allergen Reactions  . Hydralazine Swelling  . Other Other (See Comments)    Unknown blood pressure medication caused face swelling    Current Outpatient Prescriptions on File Prior to Visit  Medication Sig Dispense Refill  . acetaminophen (TYLENOL) 500 MG tablet Take 1,000 mg by mouth every 6 (six) hours as needed. For pain    . amLODipine (NORVASC) 10 MG tablet Take 1 tablet (10 mg total) by mouth daily. 30 tablet 12  . aspirin EC 81 MG EC tablet Take 1 tablet (81 mg total) by mouth daily.    Marland Kitchen atorvastatin (LIPITOR) 40 MG tablet Take 1 tablet (40 mg total) by mouth daily at 6 PM. 30 tablet 12  . cloNIDine (CATAPRES) 0.2 MG tablet Take 1 tablet (0.2 mg total) by mouth 2 (two) times daily. 60 tablet 6  . furosemide (LASIX) 20 MG tablet Take 20 mg every day 30 tablet 6  . isosorbide mononitrate (IMDUR) 120 MG 24 hr tablet Take 1 tablet (120 mg total) by mouth daily. 30 tablet 12  . metoprolol (LOPRESSOR) 50 MG tablet Take 1  tablet (50 mg total) by mouth 2 (two) times daily. 30 tablet 11  . terazosin (HYTRIN) 1 MG capsule Take 1 capsule (1 mg total) by mouth at bedtime. 30 capsule 11   No current facility-administered medications on file prior to visit.    Past Medical History  Diagnosis Date  . Diverticular disease   . Hypertensive heart disease     a. Renal Artery Duplex (8/15):  no RAS  . Chronic combined systolic and diastolic CHF (congestive heart failure)     a. Echo (06/2014): Severe LVH, EF 20-25%, no RWMA, Gr 3 DD, trivial MR, mild LAE, mild RVE, PASP 38 mmHg .>> b. Echo 2/16 EF 50-55%  . NICM (nonischemic cardiomyopathy)     a. Nuclear (06/2014): EF 32%, apical thinning, no definite scar or ischemia;  b. Echo (2/16):  Mild LVH, EF 50-55%, Gr 2 DD, no RWMA  . CKD (chronic kidney disease)   . PUD (peptic ulcer disease)   . Hypertension   . UTI (lower urinary tract infection)     Past Surgical History  Procedure Laterality Date  . None    . Dilation and curettage of uterus      Family History  Problem Relation Age of Onset  . Coronary artery disease    . Heart attack Paternal Grandfather   .  Stroke Paternal Grandmother   . Hypertension Paternal Grandmother   . Hypertension    . Kidney disease Mother     History   Social History  . Marital Status: Married    Spouse Name: N/A  . Number of Children: 2  . Years of Education: 7   Occupational History  . Not on file.   Social History Main Topics  . Smoking status: Former Smoker    Quit date: 07/05/2014  . Smokeless tobacco: Never Used  . Alcohol Use: No  . Drug Use: No  . Sexual Activity: Not on file   Other Topics Concern  . Not on file   Social History Narrative   Currently lives in a house with her husband.    Fun: Watch TV.   Denies religious beliefs effecting health care.     Review of Systems  Constitutional: Negative for fever and chills.  Gastrointestinal: Negative for nausea, vomiting, abdominal pain, diarrhea,  constipation and abdominal distention.      Objective:    BP 138/94 mmHg  Pulse 58  Temp(Src) 98.5 F (36.9 C) (Oral)  Resp 18  Ht 5' (1.524 m)  Wt 186 lb (84.369 kg)  BMI 36.33 kg/m2  SpO2 96% Nursing note and vital signs reviewed.  Physical Exam  Constitutional: She is oriented to person, place, and time. She appears well-developed and well-nourished. No distress.  Cardiovascular: Normal rate, regular rhythm, normal heart sounds and intact distal pulses.   Pulmonary/Chest: Effort normal and breath sounds normal.  Musculoskeletal:  Right Ankle - Obvious edema of right ankle over lateral malleolus noted. Pin point white spots noted over the dorsum of the foot that are slightly elevated and sporadically noted over the top of her ankle. Palpable tenderness over the lateral malleolus which is soft, but with palpable borders.   Left ankle - Edema noted. No obvious deformity or discoloration noted. No masses noted. Similar small white spots consistent with right ankle noted.   Neurological: She is alert and oriented to person, place, and time.  Skin: Skin is warm and dry.  Psychiatric: She has a normal mood and affect. Her behavior is normal. Judgment and thought content normal.       Assessment & Plan:

## 2015-03-26 NOTE — Assessment & Plan Note (Signed)
White spots on bilateral ankle consistent with possible Stucco keratosis. Start ammonium lactate lotion. If no improvement, will refer to dermatology.  Right ankle - ? Possible cyst. Will start bactrim to rule out infectious process. If no improvement noted will refer to orthopedics or podiatry for further evaluation.

## 2015-03-26 NOTE — Progress Notes (Signed)
Pre visit review using our clinic review tool, if applicable. No additional management support is needed unless otherwise documented below in the visit note. 

## 2015-03-26 NOTE — Patient Instructions (Signed)
Thank you for choosing [facility].  Summary/Instructions:  For the white spots try ammonium lactate lotion which is available over the counter.   Please start the Bactrim x 5 days.  Your prescription(s) have been submitted to your pharmacy or been printed and provided for you. Please take as directed and contact our office if you believe you are having problem(s) with the medication(s) or have any questions.  If your symptoms worsen or fail to improve, please contact our office for further instruction, or in case of emergency go directly to the emergency room at the closest medical facility.

## 2015-03-26 NOTE — Assessment & Plan Note (Signed)
Mildly elevated alkaline phosphatase level noted in previous blood work. All other liver function tests are within normal limits. This elevation is most likely related to decreased kidney function and congestive heart failure. Given no other symptoms, continue to monitor at this time.

## 2015-04-09 ENCOUNTER — Other Ambulatory Visit: Payer: Self-pay | Admitting: Physician Assistant

## 2015-04-22 ENCOUNTER — Telehealth: Payer: Self-pay | Admitting: Cardiology

## 2015-04-22 NOTE — Telephone Encounter (Signed)
Closed encounter °

## 2015-05-02 ENCOUNTER — Ambulatory Visit (INDEPENDENT_AMBULATORY_CARE_PROVIDER_SITE_OTHER): Payer: Managed Care, Other (non HMO) | Admitting: Cardiology

## 2015-05-02 VITALS — BP 140/70 | HR 50 | Ht 62.0 in | Wt 185.0 lb

## 2015-05-02 DIAGNOSIS — E878 Other disorders of electrolyte and fluid balance, not elsewhere classified: Secondary | ICD-10-CM | POA: Diagnosis not present

## 2015-05-02 NOTE — Patient Instructions (Addendum)
Your physician recommends that you schedule a follow-up appointment in: 2 months with Richardson Dopp PA  Take extra  Lasix 20 mg for next two days  Have you lab work done in two weeks

## 2015-05-02 NOTE — Progress Notes (Signed)
HPI The patient presents for followup of his nonischemic cardiomyopathy. She's had a negative stress perfusion study. Her EF is 20-25% probably related to hypertension. She has renal insufficiency. She has been seeing Mr. Kathlen Mody PAc recently.   She has done well although I note that her weight is up 40 pounds her weight in July of last year. She says she has increased abdominal girth and swelling in her "hips." She doesn't weigh herself daily. She is watching her salt and fluid. She'll get short of breath if she has abdominal distention. She's not having any new chest pressure, neck or arm discomfort.  Allergies  Allergen Reactions  . Hydralazine Swelling  . Other Other (See Comments)    Unknown blood pressure medication caused face swelling    Current Outpatient Prescriptions  Medication Sig Dispense Refill  . acetaminophen (TYLENOL) 500 MG tablet Take 1,000 mg by mouth every 6 (six) hours as needed. For pain    . amLODipine (NORVASC) 10 MG tablet Take 1 tablet (10 mg total) by mouth daily. 30 tablet 12  . aspirin EC 81 MG EC tablet Take 1 tablet (81 mg total) by mouth daily.    Marland Kitchen atorvastatin (LIPITOR) 40 MG tablet Take 1 tablet (40 mg total) by mouth daily at 6 PM. 30 tablet 12  . cloNIDine (CATAPRES) 0.2 MG tablet TAKE 1 TABLET (0.2 MG TOTAL) BY MOUTH 2 (TWO) TIMES DAILY. 60 tablet 6  . furosemide (LASIX) 20 MG tablet Take 20 mg every day 30 tablet 6  . isosorbide mononitrate (IMDUR) 120 MG 24 hr tablet Take 1 tablet (120 mg total) by mouth daily. 30 tablet 12  . metoprolol (LOPRESSOR) 50 MG tablet Take 1 tablet (50 mg total) by mouth 2 (two) times daily. 30 tablet 11  . sulfamethoxazole-trimethoprim (BACTRIM DS,SEPTRA DS) 800-160 MG per tablet Take 1 tablet by mouth 2 (two) times daily. 10 tablet 0  . terazosin (HYTRIN) 1 MG capsule Take 1 capsule (1 mg total) by mouth at bedtime. 30 capsule 11   No current facility-administered medications for this visit.    Past Medical History   Diagnosis Date  . Diverticular disease   . Hypertensive heart disease     a. Renal Artery Duplex (8/15):  no RAS  . Chronic combined systolic and diastolic CHF (congestive heart failure)     a. Echo (06/2014): Severe LVH, EF 20-25%, no RWMA, Gr 3 DD, trivial MR, mild LAE, mild RVE, PASP 38 mmHg .>> b. Echo 2/16 EF 50-55%  . NICM (nonischemic cardiomyopathy)     a. Nuclear (06/2014): EF 32%, apical thinning, no definite scar or ischemia;  b. Echo (2/16):  Mild LVH, EF 50-55%, Gr 2 DD, no RWMA  . CKD (chronic kidney disease)   . PUD (peptic ulcer disease)   . Hypertension   . UTI (lower urinary tract infection)     Past Surgical History  Procedure Laterality Date  . None    . Dilation and curettage of uterus      ROS:  As stated in the HPI and negative for all other systems.  PHYSICAL EXAM BP 140/70 mmHg  Pulse 50  Ht 5\' 2"  (1.575 m)  Wt 185 lb (83.915 kg)  BMI 33.83 kg/m2 GENERAL:  Well appearing HEENT:  Pupils equal round and reactive, fundi not visualized, oral mucosa unremarkable NECK:  No jugular venous distention, waveform within normal limits, carotid upstroke brisk and symmetric, no bruits, no thyromegaly LUNGS:  Clear to auscultation bilaterally BACK:  No CVA tenderness CHEST:  Unremarkable HEART:  PMI not displaced or sustained,S1 and S2 within normal limits, no S3, no S4, no clicks, no rubs, no murmurs ABD:  Flat, positive bowel sounds normal in frequency in pitch, no bruits, no rebound, no guarding, no midline pulsatile mass, no hepatomegaly, no splenomegaly,  Abdominal distention EXT:  2 plus pulses throughout, mild/mod ankle edema, no cyanosis no clubbing  ASSESSMENT AND PLAN  CARDIOMYOPATHY:  Presumed nonischemic. I will give her 20 mg of Lasix for the next 2 days. We discussed more liberal when necessary dosing and the need for careful followup of her basic metabolic profiles if she starts taking diuretic more frequently. She appears to be avoiding salt.  She  will come back in 2 weeks for a basic metabolic profile.   She understands that if she doesn't start weighing herself daily she is probably going to end up in the hospital with volume overload.  HTN:  Her blood pressure is reasonably controlled compared to her usual. She will continue the meds as listed.  CKD:   I will check a basic metabolic profile.

## 2015-05-03 ENCOUNTER — Encounter: Payer: Self-pay | Admitting: Cardiology

## 2015-05-13 ENCOUNTER — Telehealth: Payer: Self-pay | Admitting: *Deleted

## 2015-05-13 DIAGNOSIS — N189 Chronic kidney disease, unspecified: Secondary | ICD-10-CM

## 2015-05-13 NOTE — Telephone Encounter (Signed)
Called pt to advised per [name]. PA  needs appt so that he may go over some paper work that he recv'd to fill out for pt. Pt agreeable to come in 6/2 @ 3 pm. Pt states Dr. Percival Spanish wants heer to have bmet 5/27, I asked was she having it here. Pt said ye. I explained there was no order in the computer nor was there an appt made. I reviewed Dr. Rosezella [place] note 05/02/15 to get lab order for bmet. I will place lab order and make appt today for the pt to come in Friday 05/16/15. Pt said thank you for my help.

## 2015-05-16 ENCOUNTER — Other Ambulatory Visit (INDEPENDENT_AMBULATORY_CARE_PROVIDER_SITE_OTHER): Payer: Managed Care, Other (non HMO) | Admitting: *Deleted

## 2015-05-16 DIAGNOSIS — N189 Chronic kidney disease, unspecified: Secondary | ICD-10-CM

## 2015-05-16 LAB — BASIC METABOLIC PANEL
BUN: 37 mg/dL — ABNORMAL HIGH (ref 6–23)
CO2: 29 mEq/L (ref 19–32)
Calcium: 9.6 mg/dL (ref 8.4–10.5)
Chloride: 102 mEq/L (ref 96–112)
Creatinine, Ser: 1.67 mg/dL — ABNORMAL HIGH (ref 0.40–1.20)
GFR: 33.21 mL/min — ABNORMAL LOW (ref >60.00)
Glucose, Bld: 130 mg/dL — ABNORMAL HIGH (ref 70–99)
Potassium: 3.9 mEq/L (ref 3.5–5.1)
Sodium: 137 mEq/L (ref 135–145)

## 2015-05-22 ENCOUNTER — Ambulatory Visit (INDEPENDENT_AMBULATORY_CARE_PROVIDER_SITE_OTHER): Payer: Managed Care, Other (non HMO) | Admitting: Physician Assistant

## 2015-05-22 ENCOUNTER — Encounter: Payer: Self-pay | Admitting: Physician Assistant

## 2015-05-22 VITALS — BP 138/90 | HR 55 | Ht 62.0 in | Wt 190.0 lb

## 2015-05-22 DIAGNOSIS — I5042 Chronic combined systolic (congestive) and diastolic (congestive) heart failure: Secondary | ICD-10-CM | POA: Diagnosis not present

## 2015-05-22 DIAGNOSIS — I428 Other cardiomyopathies: Secondary | ICD-10-CM

## 2015-05-22 DIAGNOSIS — I110 Hypertensive heart disease with heart failure: Secondary | ICD-10-CM

## 2015-05-22 DIAGNOSIS — I429 Cardiomyopathy, unspecified: Secondary | ICD-10-CM | POA: Diagnosis not present

## 2015-05-22 DIAGNOSIS — N189 Chronic kidney disease, unspecified: Secondary | ICD-10-CM | POA: Diagnosis not present

## 2015-05-22 DIAGNOSIS — I509 Heart failure, unspecified: Secondary | ICD-10-CM

## 2015-05-22 NOTE — Patient Instructions (Signed)
Medication Instructions:  Your physician recommends that you continue on your current medications as directed. Please refer to the Current Medication list given to you today.   Labwork: NONE  Testing/Procedures: NONE  Follow-Up: [employer], [facility] 07/18/15 8:30  Any Other Special Instructions Will Be Listed Below (If Applicable).

## 2015-05-22 NOTE — Progress Notes (Signed)
Cardiology Office Note   Date:  05/22/2015   ID:  [name], DOB 09-06-1956, MRN [id number]  PCP:  Mauricio Po, FNP  Cardiologist:  Dr. Minus Breeding     No chief complaint on file.    History of Present Illness: [name] is a 59 y.o. female with a a long hx of uncontrolled HTN (poor adherence due to cost and adverse effects), tobacco abuse. She was admitted 06/2014 with acute combined systolic and diastolic CHF and a/c renal failure (peak Cr 2.15) in the setting of hypertensive urgency with associated elevated in troponin (peak 0.49). Echo demonstrated reduced LV function with EF 20-25%. ACEI was not started due to CKD. Patient noted hx of true allergy to Hydralazine. She was placed on Coreg and Isosorbide. Coreg was switched to Metoprolol. She underwent noninvasive testing with Lexiscan Myoview instead of L heart cath due to CKD. Myoview demonstrated no scar or ischemia. Elevated troponin was felt to be from demand ischemia. Etiology of cardiomyopathy was felt to be non-ischemic and probably related to uncontrolled HTN.   Renal artery [place] 8/15 was neg for RAS.   Seen by Dr. Minus Breeding 5/16.  I recently received some type of disability paperwork that I could not fill out without having her come in to the office.  She returns to fill out her papers.    The forms that she has are for her life insurance. Since last seen, she has been stable. Weights at home have been stable. She does take extra Lasix when her weight increases. This usually results in decreased weight and improved breathing. She is chronically short of breath with minimal activity. She is NYHA 3. She denies chest discomfort or syncope. She sleeps on an incline chronically without significant change. She denies PND. She denies significant edema.   Studies:  Echo 01/27/15 - Left ventricle: The cavity size was normal. Wall thickness was increased in a pattern of mild LVH. Systolic function was  normal. The estimated ejection fraction was in the range of 50% to 55%. Wall motion was normal; there were no regional wall motion abnormalities. Features are consistent with a pseudonormal left ventricular filling pattern, with concomitant abnormal relaxation and increased filling pressure (grade 2 diastolic dysfunction).  - Echo (06/2014): Severe LVH, EF 20-25%, no RWMA, Gr 3 DD, trivial MR, mild LAE, mild RVE, PASP 38 mmHg. - Nuclear (06/2014): EF 32%, apical thinning, no definite scar or ischemia   Past Medical History  Diagnosis Date  . Diverticular disease   . Hypertensive heart disease     a. Renal Artery Duplex (8/15):  no RAS  . Chronic combined systolic and diastolic CHF (congestive heart failure)     a. Echo (06/2014): Severe LVH, EF 20-25%, no RWMA, Gr 3 DD, trivial MR, mild LAE, mild RVE, PASP 38 mmHg .>> b. Echo 2/16 EF 50-55%  . NICM (nonischemic cardiomyopathy)     a. Nuclear (06/2014): EF 32%, apical thinning, no definite scar or ischemia;  b. Echo (2/16):  Mild LVH, EF 50-55%, Gr 2 DD, no RWMA  . CKD (chronic kidney disease)   . PUD (peptic ulcer disease)   . Hypertension   . UTI (lower urinary tract infection)     Past Surgical History  Procedure Laterality Date  . None    . Dilation and curettage of uterus       Current Outpatient Prescriptions  Medication Sig Dispense Refill  . acetaminophen (TYLENOL) 500 MG tablet Take 1,000 mg by mouth every  6 (six) hours as needed. For pain    . amLODipine (NORVASC) 10 MG tablet Take 1 tablet (10 mg total) by mouth daily. 30 tablet 12  . aspirin EC 81 MG EC tablet Take 1 tablet (81 mg total) by mouth daily.    Marland Kitchen atorvastatin (LIPITOR) 40 MG tablet Take 1 tablet (40 mg total) by mouth daily at 6 PM. 30 tablet 12  . cloNIDine (CATAPRES) 0.2 MG tablet TAKE 1 TABLET (0.2 MG TOTAL) BY MOUTH 2 (TWO) TIMES DAILY. 60 tablet 6  . furosemide (LASIX) 20 MG tablet Take 20 mg every day 30 tablet 6  . isosorbide  mononitrate (IMDUR) 120 MG 24 hr tablet Take 1 tablet (120 mg total) by mouth daily. 30 tablet 12  . metoprolol (LOPRESSOR) 50 MG tablet Take 1 tablet (50 mg total) by mouth 2 (two) times daily. 30 tablet 11  . terazosin (HYTRIN) 1 MG capsule Take 1 capsule (1 mg total) by mouth at bedtime. 30 capsule 11   No current facility-administered medications for this visit.    Allergies:   Hydralazine and Other    Social History:  The patient  reports that she quit smoking about 10 months ago. She has never used smokeless tobacco. She reports that she does not drink alcohol or use illicit drugs.   Family History:  The patient's family history includes Coronary artery disease in an other family member; Heart attack in her paternal grandfather; Hypertension in her paternal grandmother and another family member; Kidney disease in her mother; Stroke in her paternal grandmother.    ROS:  Please see the history of present illness.   Otherwise, review of systems are positive for none.   All other systems are reviewed and negative.    PHYSICAL EXAM: VS:  BP 138/90 mmHg  Pulse 55  Ht 5\' 2"  (1.575 m)  Wt 190 lb (86.183 kg)  BMI 34.74 kg/m2  SpO2 97%    Wt Readings from Last 3 Encounters:  05/22/15 190 lb (86.183 kg)  05/02/15 185 lb (83.915 kg)  03/26/15 186 lb (84.369 kg)     GEN: Well nourished, well developed, in no acute distress HEENT: normal Neck: no JVD, no masses Cardiac:  Normal S1/S2, RRR; no murmur, no rubs or gallops, trace edema  Respiratory:  clear to auscultation bilaterally, no wheezing, rhonchi or rales. GI: soft, nontender, nondistended, + BS MS: no deformity or atrophy Skin: warm and dry  Neuro:  CNs II-XII intact, Strength and sensation are intact Psych: Normal affect   EKG:  EKG is not ordered today.  It demonstrates:   N/a   Recent Labs: 07/08/2014: TSH 4.870* 07/12/2014: Hemoglobin 12.7; Platelets 218 12/03/2014: Pro B Natriuretic peptide (BNP) 87.0 02/07/2015:  ALT 16 05/16/2015: BUN 37*; Creatinine 1.67*; Potassium 3.9; Sodium 137    Lipid Panel    Component Value Date/Time   CHOL 139 01/27/2015 0734   TRIG 152.0* 01/27/2015 0734   HDL 38.90* 01/27/2015 0734   CHOLHDL 4 01/27/2015 0734   VLDL 30.4 01/27/2015 0734   LDLCALC 70 01/27/2015 0734      ASSESSMENT AND PLAN:  1. Hypertensive heart disease with heart failure: Blood pressure is improved. She is currently tolerating her current medical regimen. Continue Amlodipine, clonidine, isosorbide, metoprolol, Hytrin. 2. Chronic Combined Systolic and Diastolic CHF: Volume is overall stable. She is currently NYHA 3. She does a good job of limiting her salt as well as monitoring her daily weights. She knows when to take extra Lasix and  does so appropriately. I have completed her paperwork for her life insurance. 3. NICM (nonischemic cardiomyopathy): Likely related to HTN. Continue beta blocker, nitrates. She is not on ACEI/ARB or spironolactone 2/2 CKD. She has a true allergy to Hydralazine. Recent echocardiogram demonstrated improved LV function. EF is now normal. 3. CKD (chronic kidney disease):  Recent creatinine stable 4.  Hyperlipidemia: Recent LDL optimal.   Current medicines are reviewed at length with the patient today.  The patient does not have concerns regarding medicines.  The following changes have been made:  no change  Labs/ tests ordered today include:  No orders of the defined types were placed in this encounter.     Disposition:   FU me in 2 months.   Signed, Versie Starks, MHS 05/22/2015 5:37 PM    [facility] [address], [place], [place]  [address] Phone: [phone]; Fax: [phone]

## 2015-05-26 ENCOUNTER — Telehealth: Payer: Self-pay | Admitting: Physician Assistant

## 2015-05-26 NOTE — Telephone Encounter (Signed)
[employer]-Physical Ability Assessment paper completed by Richardson Dopp PA-C, sent to [facility] for the records to be processed.

## 2015-06-05 ENCOUNTER — Telehealth: Payer: Self-pay | Admitting: Physician Assistant

## 2015-06-05 NOTE — Telephone Encounter (Signed)
New problem   Pt need to speak to you concerning the papers [name] said he would fax to [facility] for her. Please call pt.

## 2015-06-11 NOTE — Telephone Encounter (Signed)
Ptcb and has been notified that paper work was completed by [facility] 6/20 and 47 pages were faxed to [place]. Pt said thank you for our help.

## 2015-06-11 NOTE — Telephone Encounter (Signed)
lmptcb to advise pt that I s/w Med rec dept today and paper work was completed by Quintella Reichert 6/20 and was faxed to [facility] 47 pages.

## 2015-06-13 ENCOUNTER — Telehealth: Payer: Self-pay | Admitting: Cardiology

## 2015-06-13 NOTE — Telephone Encounter (Signed)
Suanne Marker is calling from [place] to get clarification on the restrictions for  Ms. Laflin to return back to work . Please call    Thanks

## 2015-06-16 NOTE — Telephone Encounter (Signed)
Lm Rhonda from [place] in regards to clarification of pt return back to work paperwork that [employer] [place] filled out. I stated I will be out of the office 6/28 and will rtn 6/29.

## 2015-06-18 NOTE — Telephone Encounter (Signed)
I s/w Suanne Marker w/[employer] in reference to disability paperwork she states is from 05/20/15 signed by Dr. Percival Spanish and needing some clarification. I stated if Dr. Percival Spanish is the one who signed the paperwork then she will need to s/w him to get clarification. I told her that I will send a note to Dr. Rosezella [place] nurse informing them of our conversation today. After hanging up I reviewed pt's chart further and called Rhonda back and lmom that I did not see any forms from 05/20/15 though I did see 05/22/15 and this set of paperwork was signed by Richardson Dopp, PA, stated not sure if this is the paperwork she is referring to or not since she stated 05/20/15 and I do not see any 05/20/15 paperwork. If 05/22/15 paperwork is what she is inquiring about then she will need to s/w PA however he will not be back in the office until 06/20/15.

## 2015-06-18 NOTE — Telephone Encounter (Signed)
Rhonda from [place] cb to advise that she did not have paperwork dated 05/22/15 and asked if this could be sent to them as well. I advised that I will ask our HIM dept to call her as well as to see if they can help out further about this paperwork from 05/20/15; see note from Charlotte Sanes, RN from today where he s/w Suanne Marker today as well.

## 2015-06-18 NOTE — Telephone Encounter (Signed)
[name] called from [place] - asked for clarification on pt's ability to work - based on current filed paperwork for disability, she cannot determine this. Advised her to fax back appropriate pages/request needed information - can have physician review - the form visible in electronic record does not specify this.

## 2015-06-20 ENCOUNTER — Telehealth: Payer: Self-pay | Admitting: Family

## 2015-06-20 NOTE — Telephone Encounter (Signed)
Called patient to see if a recent mammogram has been done left voicemail.

## 2015-07-08 ENCOUNTER — Other Ambulatory Visit: Payer: Self-pay | Admitting: Physician Assistant

## 2015-07-17 NOTE — Progress Notes (Signed)
Cardiology Office Note   Date:  07/18/2015   ID:  [name], DOB 1956/08/28, MRN [id number]  PCP:  [name], MD  Cardiologist:  Dr. Minus Breeding     Chief Complaint  Patient presents with  . Follow-up    HTN, CHF     History of Present Illness: [name] is a 59 y.o. female with a a long hx of uncontrolled HTN (poor adherence due to cost and adverse effects), tobacco abuse. She was admitted 06/2014 with acute combined systolic and diastolic CHF and a/c renal failure (peak Cr 2.15) in the setting of hypertensive urgency with associated elevated in troponin (peak 0.49). Echo demonstrated reduced LV function with EF 20-25%. ACEI was not started due to CKD. Patient noted hx of true allergy to Hydralazine. She was placed on Coreg and Isosorbide. Coreg was switched to Metoprolol. She underwent noninvasive testing with Lexiscan Myoview instead of L heart cath due to CKD. Myoview demonstrated no scar or ischemia. Elevated troponin was felt to be from demand ischemia. Etiology of cardiomyopathy was felt to be non-ischemic and probably related to uncontrolled HTN.   Renal artery [place] 8/15 was neg for RAS. Follow-up echo in 2/16 demonstrated improved LV function with an EF of 50-55% and moderate diastolic dysfunction.  She returns for FU.  Here with her son.  Doing well.  Breathing is unchanged.  No chest pain.  Sleeps on 2 pillows.  No PND.  She does snore. No witnessed apnea.  No syncope.  LE edema stable.     Studies:  - Echo (06/2014): Severe LVH, EF 20-25%, no RWMA, Gr 3 DD, trivial MR, mild LAE, mild RVE, PASP 38 mmHg. - Echo (01/27/15): Mild LVH, EF 50-55%, normal wall motion, grade 2 diastolic dysfunction - Nuclear (06/2014): EF 32%, apical thinning, no definite scar or ischemia   Past Medical History  Diagnosis Date  . Diverticular disease   . Hypertensive heart disease     a. Renal Artery Duplex (8/15):  no RAS  . Chronic combined systolic and diastolic CHF  (congestive heart failure)     a. Echo (06/2014): Severe LVH, EF 20-25%, no RWMA, Gr 3 DD, trivial MR, mild LAE, mild RVE, PASP 38 mmHg .>> b. Echo 2/16 EF 50-55%  . NICM (nonischemic cardiomyopathy)     a. Nuclear (06/2014): EF 32%, apical thinning, no definite scar or ischemia;  b. Echo (2/16):  Mild LVH, EF 50-55%, Gr 2 DD, no RWMA  . CKD (chronic kidney disease)   . PUD (peptic ulcer disease)   . Hypertension   . UTI (lower urinary tract infection)     Past Surgical History  Procedure Laterality Date  . None    . Dilation and curettage of uterus       Current Outpatient Prescriptions  Medication Sig Dispense Refill  . acetaminophen (TYLENOL) 500 MG tablet Take 1,000 mg by mouth every 6 (six) hours as needed. For pain    . amLODipine (NORVASC) 10 MG tablet Take 1 tablet (10 mg total) by mouth daily. 90 tablet 3  . aspirin EC 81 MG EC tablet Take 1 tablet (81 mg total) by mouth daily.    Marland Kitchen atorvastatin (LIPITOR) 40 MG tablet Take 1 tablet (40 mg total) by mouth daily at 6 PM. 90 tablet 3  . cloNIDine (CATAPRES) 0.2 MG tablet TAKE 1 TABLET (0.2 MG TOTAL) BY MOUTH 2 (TWO) TIMES DAILY. 60 tablet 6  . furosemide (LASIX) 20 MG tablet TAKE 1 TABLET BY MOUTH  EVERY DAY 30 tablet 6  . isosorbide mononitrate (IMDUR) 120 MG 24 hr tablet Take 1 tablet (120 mg total) by mouth daily. 30 tablet 12  . metoprolol (LOPRESSOR) 50 MG tablet Take 1 tablet (50 mg total) by mouth 2 (two) times daily. 180 tablet 3  . terazosin (HYTRIN) 1 MG capsule Take 1 capsule (1 mg total) by mouth at bedtime. 30 capsule 11   No current facility-administered medications for this visit.    Allergies:   Hydralazine and Other    Social History:  The patient  reports that she quit smoking about a year ago. She has never used smokeless tobacco. She reports that she does not drink alcohol or use illicit drugs.   Family History:  The patient's family history includes Coronary artery disease in an other family member;  Heart attack in her paternal grandfather; Hypertension in her paternal grandmother and another family member; Kidney disease in her mother; Stroke in her paternal grandmother.    ROS:  Please see the history of present illness.    Review of Systems  Cardiovascular: Positive for dyspnea on exertion, leg swelling and orthopnea.  All other systems reviewed and are negative.    PHYSICAL EXAM: VS:  BP 124/82 mmHg  Pulse 58  Ht 5' (1.524 m)  Wt 194 lb 12.8 oz (88.361 kg)  BMI 38.04 kg/m2  SpO2 97%    Wt Readings from Last 3 Encounters:  07/18/15 194 lb 12.8 oz (88.361 kg)  05/22/15 190 lb (86.183 kg)  05/02/15 185 lb (83.915 kg)     GEN: Well nourished, well developed, in no acute distress HEENT: normal Neck: no JVD, no masses Cardiac:  Normal S1/S2, RRR; no murmur, no rubs or gallops, trace edema (compression stockings in place) Respiratory:  clear to auscultation bilaterally, no wheezing, rhonchi or rales. GI: soft, nontender, nondistended, + BS MS: no deformity or atrophy Skin: warm and dry  Neuro:  CNs II-XII intact, Strength and sensation are intact Psych: Normal affect   EKG:  EKG is not ordered today.  It demonstrates:   n/a   Recent Labs: 12/03/2014: Pro B Natriuretic peptide (BNP) 87.0 02/07/2015: ALT 16 05/16/2015: BUN 37*; Creatinine, Ser 1.67*; Potassium 3.9; Sodium 137    Lipid Panel    Component Value Date/Time   CHOL 139 01/27/2015 0734   TRIG 152.0* 01/27/2015 0734   HDL 38.90* 01/27/2015 0734   CHOLHDL 4 01/27/2015 0734   VLDL 30.4 01/27/2015 0734   LDLCALC 70 01/27/2015 0734      ASSESSMENT AND PLAN:  1. Hypertensive heart disease with heart failure: Her BP is very well controlled.  She is currently tolerating her current medical regimen. Continue Amlodipine, clonidine, isosorbide, metoprolol, Hytrin. 2. Chronic Diastolic CHF: Volume is overall stable. She is currently NYHA 2b-3. She does a good job of limiting her salt as well as monitoring  her daily weights. She knows when to take extra Lasix and does so appropriately.  3. NICM (nonischemic cardiomyopathy): Likely related to HTN. Continue beta blocker, nitrates. She is not on ACEI/ARB or spironolactone 2/2 CKD. She has a true allergy to Hydralazine. Echo in 2/16 demonstrated improved LV function.  3. CKD (chronic kidney disease):  Recent creatinine stable 4.  Hyperlipidemia: Recent LDL optimal. 5.  Obesity:  We discussed different strategies for weight loss.  I offered her a referral to the PREP program at [facility].  She would like to think about this. 6.  Snoring:  We discussed +/- proceeding with  sleep study.  She would like to think about this.    Medication Adjustments: Current medicines are reviewed at length with the patient today.  Concerns are outlined above.  The following changes have been made:   Discontinued Medications   No medications on file   Modified Medications   Modified Medication Previous Medication   AMLODIPINE (NORVASC) 10 MG TABLET amLODipine (NORVASC) 10 MG tablet      Take 1 tablet (10 mg total) by mouth daily.    Take 1 tablet (10 mg total) by mouth daily.   ATORVASTATIN (LIPITOR) 40 MG TABLET atorvastatin (LIPITOR) 40 MG tablet      Take 1 tablet (40 mg total) by mouth daily at 6 PM.    Take 1 tablet (40 mg total) by mouth daily at 6 PM.   METOPROLOL (LOPRESSOR) 50 MG TABLET metoprolol (LOPRESSOR) 50 MG tablet      Take 1 tablet (50 mg total) by mouth 2 (two) times daily.    Take 1 tablet (50 mg total) by mouth 2 (two) times daily.   New Prescriptions   No medications on file    Labs/ tests ordered today include:  No orders of the defined types were placed in this encounter.     Disposition:   FU me 6 mos.    Signed, Versie Starks, MHS 07/18/2015 9:22 AM    [facility] [address] [place], [place], [place]  [address] Phone: [phone]; Fax: [phone]

## 2015-07-18 ENCOUNTER — Ambulatory Visit (INDEPENDENT_AMBULATORY_CARE_PROVIDER_SITE_OTHER): Payer: Managed Care, Other (non HMO) | Admitting: Physician Assistant

## 2015-07-18 ENCOUNTER — Encounter: Payer: Self-pay | Admitting: Physician Assistant

## 2015-07-18 VITALS — BP 124/82 | HR 58 | Ht 60.0 in | Wt 194.8 lb

## 2015-07-18 DIAGNOSIS — I110 Hypertensive heart disease with heart failure: Secondary | ICD-10-CM | POA: Diagnosis not present

## 2015-07-18 DIAGNOSIS — N189 Chronic kidney disease, unspecified: Secondary | ICD-10-CM | POA: Diagnosis not present

## 2015-07-18 DIAGNOSIS — I5032 Chronic diastolic (congestive) heart failure: Secondary | ICD-10-CM | POA: Diagnosis not present

## 2015-07-18 DIAGNOSIS — E785 Hyperlipidemia, unspecified: Secondary | ICD-10-CM

## 2015-07-18 DIAGNOSIS — I429 Cardiomyopathy, unspecified: Secondary | ICD-10-CM | POA: Diagnosis not present

## 2015-07-18 DIAGNOSIS — I509 Heart failure, unspecified: Secondary | ICD-10-CM

## 2015-07-18 DIAGNOSIS — I428 Other cardiomyopathies: Secondary | ICD-10-CM

## 2015-07-18 MED ORDER — AMLODIPINE BESYLATE 10 MG PO TABS: 10.0000 mg | ORAL_TABLET | Freq: Every day | ORAL | Status: DC

## 2015-07-18 MED ORDER — METOPROLOL TARTRATE 50 MG PO TABS: 50.0000 mg | ORAL_TABLET | Freq: Two times a day (BID) | ORAL | Status: DC

## 2015-07-18 MED ORDER — ATORVASTATIN CALCIUM 40 MG PO TABS: 40.0000 mg | ORAL_TABLET | Freq: Every day | ORAL | Status: DC

## 2015-07-18 NOTE — Patient Instructions (Signed)
Medication Instructions:  Refills have been called into your pharmacy for the medications below: Amlodipine Lipitor Metoprolol  Labwork: NONE  Testing/Procedures: NONE  Follow-Up: Your physician wants you to follow-up in: 6 Months with Richardson Dopp, PA-C.  You will receive a reminder letter in the mail two months in advance. If you don't receive a letter, please call our office to schedule the follow-up appointment.   Any Other Special Instructions Will Be Listed Below (If Applicable).

## 2015-07-22 ENCOUNTER — Ambulatory Visit: Payer: Managed Care, Other (non HMO) | Admitting: Physician Assistant

## 2015-09-20 ENCOUNTER — Other Ambulatory Visit: Payer: Self-pay | Admitting: Cardiology

## 2015-10-22 ENCOUNTER — Other Ambulatory Visit: Payer: Self-pay | Admitting: Cardiology

## 2015-10-22 MED ORDER — CLONIDINE HCL 0.2 MG PO TABS: ORAL_TABLET | ORAL | Status: DC

## 2015-12-04 ENCOUNTER — Inpatient Hospital Stay (HOSPITAL_COMMUNITY)
Admission: EM | Admit: 2015-12-04 | Discharge: 2015-12-09 | DRG: 637 | Disposition: A | Discharge status: Home Health | Payer: BLUE CROSS/BLUE SHIELD | Attending: Internal Medicine | Admitting: Internal Medicine

## 2015-12-04 ENCOUNTER — Emergency Department (HOSPITAL_COMMUNITY): Payer: BLUE CROSS/BLUE SHIELD

## 2015-12-04 ENCOUNTER — Encounter (HOSPITAL_COMMUNITY): Payer: Self-pay | Admitting: Emergency Medicine

## 2015-12-04 DIAGNOSIS — Z79899 Other long term (current) drug therapy: Secondary | ICD-10-CM | POA: Diagnosis not present

## 2015-12-04 DIAGNOSIS — Z87891 Personal history of nicotine dependence: Secondary | ICD-10-CM | POA: Diagnosis not present

## 2015-12-04 DIAGNOSIS — I5032 Chronic diastolic (congestive) heart failure: Secondary | ICD-10-CM | POA: Diagnosis present

## 2015-12-04 DIAGNOSIS — E860 Dehydration: Secondary | ICD-10-CM | POA: Diagnosis present

## 2015-12-04 DIAGNOSIS — K921 Melena: Secondary | ICD-10-CM | POA: Diagnosis present

## 2015-12-04 DIAGNOSIS — Q899 Congenital malformation, unspecified: Secondary | ICD-10-CM

## 2015-12-04 DIAGNOSIS — K859 Acute pancreatitis without necrosis or infection, unspecified: Secondary | ICD-10-CM | POA: Diagnosis present

## 2015-12-04 DIAGNOSIS — E1122 Type 2 diabetes mellitus with diabetic chronic kidney disease: Secondary | ICD-10-CM | POA: Diagnosis present

## 2015-12-04 DIAGNOSIS — K5900 Constipation, unspecified: Secondary | ICD-10-CM | POA: Diagnosis present

## 2015-12-04 DIAGNOSIS — I214 Non-ST elevation (NSTEMI) myocardial infarction: Secondary | ICD-10-CM

## 2015-12-04 DIAGNOSIS — K625 Hemorrhage of anus and rectum: Secondary | ICD-10-CM | POA: Diagnosis present

## 2015-12-04 DIAGNOSIS — Z7982 Long term (current) use of aspirin: Secondary | ICD-10-CM

## 2015-12-04 DIAGNOSIS — N179 Acute kidney failure, unspecified: Secondary | ICD-10-CM | POA: Diagnosis present

## 2015-12-04 DIAGNOSIS — I428 Other cardiomyopathies: Secondary | ICD-10-CM

## 2015-12-04 DIAGNOSIS — I130 Hypertensive heart and chronic kidney disease with heart failure and stage 1 through stage 4 chronic kidney disease, or unspecified chronic kidney disease: Secondary | ICD-10-CM | POA: Diagnosis present

## 2015-12-04 DIAGNOSIS — R140 Abdominal distension (gaseous): Secondary | ICD-10-CM | POA: Diagnosis present

## 2015-12-04 DIAGNOSIS — E1310 Other specified diabetes mellitus with ketoacidosis without coma: Secondary | ICD-10-CM | POA: Diagnosis present

## 2015-12-04 DIAGNOSIS — R7989 Other specified abnormal findings of blood chemistry: Secondary | ICD-10-CM | POA: Diagnosis present

## 2015-12-04 DIAGNOSIS — I5042 Chronic combined systolic (congestive) and diastolic (congestive) heart failure: Secondary | ICD-10-CM | POA: Diagnosis present

## 2015-12-04 DIAGNOSIS — N184 Chronic kidney disease, stage 4 (severe): Secondary | ICD-10-CM | POA: Diagnosis present

## 2015-12-04 DIAGNOSIS — I429 Cardiomyopathy, unspecified: Secondary | ICD-10-CM | POA: Diagnosis not present

## 2015-12-04 DIAGNOSIS — R778 Other specified abnormalities of plasma proteins: Secondary | ICD-10-CM | POA: Diagnosis present

## 2015-12-04 DIAGNOSIS — E0810 Diabetes mellitus due to underlying condition with ketoacidosis without coma: Secondary | ICD-10-CM | POA: Diagnosis not present

## 2015-12-04 DIAGNOSIS — E1110 Type 2 diabetes mellitus with ketoacidosis without coma: Secondary | ICD-10-CM | POA: Diagnosis present

## 2015-12-04 DIAGNOSIS — R531 Weakness: Secondary | ICD-10-CM | POA: Diagnosis present

## 2015-12-04 DIAGNOSIS — I248 Other forms of acute ischemic heart disease: Secondary | ICD-10-CM | POA: Diagnosis present

## 2015-12-04 DIAGNOSIS — K8020 Calculus of gallbladder without cholecystitis without obstruction: Secondary | ICD-10-CM

## 2015-12-04 DIAGNOSIS — E785 Hyperlipidemia, unspecified: Secondary | ICD-10-CM | POA: Diagnosis present

## 2015-12-04 DIAGNOSIS — R52 Pain, unspecified: Secondary | ICD-10-CM

## 2015-12-04 LAB — COMPREHENSIVE METABOLIC PANEL
ALT: 28 U/L (ref 14–54)
AST: 22 U/L (ref 15–41)
Albumin: 3.7 g/dL (ref 3.5–5.0)
Alkaline Phosphatase: 244 U/L — ABNORMAL HIGH (ref 38–126)
Anion gap: 20 — ABNORMAL HIGH (ref 5–15)
BUN: 71 mg/dL — ABNORMAL HIGH (ref 6–20)
CO2: 18 mmol/L — ABNORMAL LOW (ref 22–32)
Calcium: 9.7 mg/dL (ref 8.9–10.3)
Chloride: 95 mmol/L — ABNORMAL LOW (ref 101–111)
Creatinine, Ser: 2.58 mg/dL — ABNORMAL HIGH (ref 0.44–1.00)
GFR calc Af Amer: 22 mL/min — ABNORMAL LOW (ref >60)
GFR calc non Af Amer: 19 mL/min — ABNORMAL LOW (ref >60)
Glucose, Bld: 1172 mg/dL (ref 65–99)
Potassium: 4.5 mmol/L (ref 3.5–5.1)
Sodium: 133 mmol/L — ABNORMAL LOW (ref 135–145)
Total Bilirubin: 1 mg/dL (ref 0.3–1.2)
Total Protein: 8 g/dL (ref 6.5–8.1)

## 2015-12-04 LAB — CBC
HCT: 46.6 % — ABNORMAL HIGH (ref 36.0–46.0)
Hemoglobin: 17 g/dL — ABNORMAL HIGH (ref 12.0–15.0)
MCH: 27.2 pg (ref 26.0–34.0)
MCHC: 36.5 g/dL — ABNORMAL HIGH (ref 30.0–36.0)
MCV: 74.6 fL — ABNORMAL LOW (ref 78.0–100.0)
Platelets: 293 10*3/uL (ref 150–400)
RBC: 6.25 MIL/uL — ABNORMAL HIGH (ref 3.87–5.11)
WBC: 12.1 10*3/uL — ABNORMAL HIGH (ref 4.0–10.5)

## 2015-12-04 LAB — URINALYSIS, ROUTINE W REFLEX MICROSCOPIC
Bilirubin Urine: NEGATIVE
Glucose, UA: >1000 mg/dL — AB
Ketones, ur: NEGATIVE mg/dL
Nitrite: NEGATIVE
Protein, ur: 100 mg/dL — AB
Specific Gravity, Urine: 1.029 (ref 1.005–1.030)
pH: 5 (ref 5.0–8.0)

## 2015-12-04 LAB — TROPONIN I: Troponin I: 0.08 ng/mL — ABNORMAL HIGH (ref <0.031)

## 2015-12-04 LAB — URINE MICROSCOPIC-ADD ON

## 2015-12-04 LAB — LIPASE, BLOOD: Lipase: 1895 U/L — ABNORMAL HIGH (ref 11–51)

## 2015-12-04 LAB — POC OCCULT BLOOD, ED: Fecal Occult Bld: POSITIVE — AB

## 2015-12-04 MED ORDER — SODIUM CHLORIDE 0.9 % IV BOLUS (SEPSIS)
500.0000 mL | Freq: Once | INTRAVENOUS | Status: AC
  Administered 2015-12-04: 500 mL via INTRAVENOUS

## 2015-12-04 MED ORDER — SODIUM CHLORIDE 0.9 % IV SOLN
INTRAVENOUS | Status: DC
  Administered 2015-12-04: 5.4 [IU]/h via INTRAVENOUS
  Filled 2015-12-04: qty 2.5

## 2015-12-04 MED ORDER — ONDANSETRON HCL 4 MG/2ML IJ SOLN
4.0000 mg | Freq: Once | INTRAMUSCULAR | Status: AC
  Administered 2015-12-04: 4 mg via INTRAVENOUS
  Filled 2015-12-04: qty 2

## 2015-12-04 NOTE — ED Notes (Signed)
hospitalist at te bedside.

## 2015-12-04 NOTE — ED Provider Notes (Signed)
CSN: [id number]     Arrival date & time 12/04/15  1813 History   First MD Initiated Contact with Patient 12/04/15 2020     Chief Complaint  Patient presents with  . Headache  . Cough  . Emesis    Patient is a 59 y.o. female presenting with cough and vomiting. The history is provided by the patient.  Cough Severity:  Moderate Onset quality:  Gradual Duration:  1 week Timing:  Intermittent Progression:  Worsening Chronicity:  New Relieved by:  Nothing Worsened by:  Nothing tried Associated symptoms: headaches and shortness of breath   Associated symptoms: no chest pain and no fever   Emesis Associated symptoms: headaches   Patient presents for multiple complaints She reports she has had cough for past week She also had decreased appetite for one week and vomiting over past day She also reports mild abdominal pain and also blood in her stool No active CP She has generalized weakness   Past Medical History  Diagnosis Date  . Diverticular disease   . Hypertensive heart disease     a. Renal Artery Duplex (8/15):  no RAS  . Chronic combined systolic and diastolic CHF (congestive heart failure) ([place])     a. Echo (06/2014): Severe LVH, EF 20-25%, no RWMA, Gr 3 DD, trivial MR, mild LAE, mild RVE, PASP 38 mmHg .>> b. Echo 2/16 EF 50-55%  . NICM (nonischemic cardiomyopathy) ([place])     a. Nuclear (06/2014): EF 32%, apical thinning, no definite scar or ischemia;  b. Echo (2/16):  Mild LVH, EF 50-55%, Gr 2 DD, no RWMA  . CKD (chronic kidney disease)   . PUD (peptic ulcer disease)   . Hypertension   . UTI (lower urinary tract infection)    Past Surgical History  Procedure Laterality Date  . None    . Dilation and curettage of uterus     Family History  Problem Relation Age of Onset  . Coronary artery disease    . Hypertension    . Heart attack Paternal Grandfather   . Stroke Paternal Grandmother   . Hypertension Paternal Grandmother   . Kidney disease Mother    Social  History  Substance Use Topics  . Smoking status: Former Smoker    Quit date: 07/05/2014  . Smokeless tobacco: Never Used  . Alcohol Use: No   OB History    No data available     Review of Systems  Constitutional: Positive for fatigue. Negative for fever.  Respiratory: Positive for cough and shortness of breath.   Cardiovascular: Negative for chest pain.  Gastrointestinal: Positive for vomiting and blood in stool.  Neurological: Positive for headaches.  All other systems reviewed and are negative.     Allergies  Hydralazine and Other  Home Medications   Prior to Admission medications   Medication Sig Start Date End Date Taking? Authorizing Provider  acetaminophen (TYLENOL) 500 MG tablet Take 1,000 mg by mouth every 6 (six) hours as needed for mild pain, moderate pain or headache. For pain   Yes Historical Provider, MD  amLODipine (NORVASC) 10 MG tablet Take 1 tablet (10 mg total) by mouth daily. 07/18/15  Yes Liliane Shi, PA-C  aspirin EC 81 MG EC tablet Take 1 tablet (81 mg total) by mouth daily. 07/12/14  Yes Almyra Deforest, PA  atorvastatin (LIPITOR) 40 MG tablet Take 1 tablet (40 mg total) by mouth daily at 6 PM. 07/18/15  Yes Liliane Shi, PA-C  cloNIDine (CATAPRES) 0.2  MG tablet TAKE 1 TABLET (0.2 MG TOTAL) BY MOUTH 2 (TWO) TIMES DAILY. 10/22/15  Yes Minus Breeding, MD  furosemide (LASIX) 20 MG tablet TAKE 1 TABLET BY MOUTH EVERY DAY 07/08/15  Yes Minus Breeding, MD  isosorbide mononitrate (IMDUR) 120 MG 24 hr tablet Take 1 tablet (120 mg total) by mouth daily. 07/12/14  Yes Almyra Deforest, PA  metoprolol (LOPRESSOR) 50 MG tablet Take 1 tablet (50 mg total) by mouth 2 (two) times daily. 07/18/15  Yes Liliane Shi, PA-C  terazosin (HYTRIN) 1 MG capsule Take 1 capsule (1 mg total) by mouth at bedtime. 12/27/14  Yes Scott T Weaver, PA-C   BP 138/74 mmHg  Pulse 89  Temp(Src) 97.4 F (36.3 C) (Oral)  Resp 18  SpO2 97% Physical Exam CONSTITUTIONAL: elderly, frail HEAD:  Normocephalic/atraumatic EYES: EOMI ENMT: Mucous membranes dry, poor dentition NECK: supple no meningeal signs SPINE/BACK:entire spine nontender CV: S1/S2 noted LUNGS: Lungs are clear to auscultation bilaterally, no apparent distress ABDOMEN: soft, nontender, no rebound or guarding, bowel sounds noted throughout abdomen, umbilical hernia that is easily reducible and no overlying erythema GU:no cva tenderness NEURO: Pt is awake/alert/appropriate, moves all extremitiesx4.    EXTREMITIES: pulses normal/equal, full ROM SKIN: warm, color normal PSYCH: no abnormalities of mood noted, alert and oriented to situation  ED Course  Procedures  CRITICAL CARE Performed by: Sharyon Cable Total critical care time: 40 minutes Critical care time was exclusive of separately billable procedures and treating other patients. Critical care was necessary to treat or prevent imminent or life-threatening deterioration. Critical care was time spent personally by me on the following activities: development of treatment plan with patient and/or surrogate as well as nursing, discussions with consultants, evaluation of patient's response to treatment, examination of patient, obtaining history from patient or surrogate, ordering and performing treatments and interventions, ordering and review of laboratory studies, ordering and review of radiographic studies, pulse oximetry and re-evaluation of patient's condition. PT WITH DKA, GLUCOSE >1000, REQUIRING IV FLUIDS/INSULIN   Medications  insulin regular (NOVOLIN R,HUMULIN R) 250 Units in sodium chloride 0.9 % 250 mL (1 Units/mL) infusion (5.4 Units/hr Intravenous New Bag/Given 12/04/15 2140)  ondansetron (ZOFRAN) injection 4 mg (4 mg Intravenous Given 12/04/15 2057)  sodium chloride 0.9 % bolus 500 mL (500 mLs Intravenous New Bag/Given 12/04/15 2141)    Labs Review Labs Reviewed  LIPASE, BLOOD - Abnormal; Notable for the following:    Lipase 1895 (*)    All  other components within normal limits  COMPREHENSIVE METABOLIC PANEL - Abnormal; Notable for the following:    Sodium 133 (*)    Chloride 95 (*)    CO2 18 (*)    Glucose, Bld 1172 (*)    BUN 71 (*)    Creatinine, Ser 2.58 (*)    Alkaline Phosphatase 244 (*)    GFR calc non Af Amer 19 (*)    GFR calc Af Amer 22 (*)    Anion gap 20 (*)    All other components within normal limits  URINALYSIS, ROUTINE W REFLEX MICROSCOPIC (NOT AT [facility]) - Abnormal; Notable for the following:    APPearance CLOUDY (*)    Glucose, UA >1000 (*)    Hgb urine dipstick MODERATE (*)    Protein, ur 100 (*)    Leukocytes, UA TRACE (*)    All other components within normal limits  TROPONIN I - Abnormal; Notable for the following:    Troponin I 0.08 (*)    All other components  within normal limits  URINE MICROSCOPIC-ADD ON - Abnormal; Notable for the following:    Squamous Epithelial / LPF 0-5 (*)    Bacteria, UA RARE (*)    All other components within normal limits  POC OCCULT BLOOD, ED - Abnormal; Notable for the following:    Fecal Occult Bld POSITIVE (*)    All other components within normal limits  CBC    Imaging Review Dg Chest Portable 1 View  12/04/2015  CLINICAL DATA:  Cough and congestion, history CHF EXAM: PORTABLE CHEST 1 VIEW COMPARISON:  07/07/2014 FINDINGS: Cardiomegaly evident without current edema, CHF, focal pneumonia, collapse or consolidation. No significant effusion or pneumothorax. Tortuous elongated thoracic aorta. Slight rotation to the left. Degenerative changes of the spine with an associated scoliosis. IMPRESSION: Cardiomegaly without CHF or pneumonia.  Chronic changes as above. Electronically Signed   By: Jerilynn Mages.  Shick M.D.   On: 12/04/2015 21:05   I have personally reviewed and evaluated these images and lab results as part of my medical decision-making.   EKG Interpretation   Date/Time:  Thursday December 04 2015 20:55:07 EST Ventricular Rate:  83 PR Interval:  163 QRS  Duration: 107 QT Interval:  404 QTC Calculation: 475 R Axis:   -30 Text Interpretation:  Sinus rhythm LVH with secondary repolarization  abnormality Anterior infarct, old Abnormal ekg No significant change since  last tracing Confirmed by Christy Gentles  MD, [place] ([id number]) on 12/04/2015  9:17:25 PM     9:28 PM Pt is very ill She has significant hyperglycemia/DKA She is dehydrated 10:13 PM Pt with multiple metabolic abnormalities Hemodynamically stable No CP reported No focal abd tenderness Will need admission and workup She reports she is not diabetic BP 137/83 mmHg  Pulse 81  Temp(Src) 97.4 F (36.3 C) (Oral)  Resp 28  SpO2 95%  10:47 PM D/w dr Hal Hope Will admit to stepdown Pt stabilized in the ER  MDM   Final diagnoses:  Diabetic ketoacidosis associated with other specified diabetes mellitus ([place])  Acute pancreatitis, unspecified complication status, unspecified pancreatitis type  AKI (acute kidney injury) ([place])  Dehydration  Non-STEMI (non-ST elevated myocardial infarction) [facility])  Rectal bleeding    Nursing notes including past medical history and social history reviewed and considered in documentation xrays/imaging reviewed by myself and considered during evaluation Labs/vital reviewed myself and considered during evaluation     Ripley Fraise, MD 12/04/15 2248

## 2015-12-04 NOTE — ED Notes (Signed)
Pt sts HA, cough and congestion and N/V x 2 days with increased weakness

## 2015-12-05 ENCOUNTER — Inpatient Hospital Stay (HOSPITAL_COMMUNITY): Payer: BLUE CROSS/BLUE SHIELD

## 2015-12-05 ENCOUNTER — Telehealth: Payer: Self-pay | Admitting: Cardiology

## 2015-12-05 ENCOUNTER — Encounter (HOSPITAL_COMMUNITY): Payer: Self-pay | Admitting: Internal Medicine

## 2015-12-05 DIAGNOSIS — I5042 Chronic combined systolic (congestive) and diastolic (congestive) heart failure: Secondary | ICD-10-CM

## 2015-12-05 DIAGNOSIS — E0810 Diabetes mellitus due to underlying condition with ketoacidosis without coma: Secondary | ICD-10-CM

## 2015-12-05 DIAGNOSIS — R140 Abdominal distension (gaseous): Secondary | ICD-10-CM | POA: Diagnosis present

## 2015-12-05 DIAGNOSIS — R778 Other specified abnormalities of plasma proteins: Secondary | ICD-10-CM | POA: Diagnosis present

## 2015-12-05 DIAGNOSIS — I429 Cardiomyopathy, unspecified: Secondary | ICD-10-CM

## 2015-12-05 DIAGNOSIS — E1310 Other specified diabetes mellitus with ketoacidosis without coma: Principal | ICD-10-CM

## 2015-12-05 DIAGNOSIS — N179 Acute kidney failure, unspecified: Secondary | ICD-10-CM | POA: Diagnosis present

## 2015-12-05 DIAGNOSIS — R7989 Other specified abnormal findings of blood chemistry: Secondary | ICD-10-CM

## 2015-12-05 DIAGNOSIS — K625 Hemorrhage of anus and rectum: Secondary | ICD-10-CM | POA: Diagnosis present

## 2015-12-05 LAB — GLUCOSE, CAPILLARY
Glucose-Capillary: 169 mg/dL — ABNORMAL HIGH (ref 65–99)
Glucose-Capillary: 229 mg/dL — ABNORMAL HIGH (ref 65–99)
Glucose-Capillary: 376 mg/dL — ABNORMAL HIGH (ref 65–99)
Glucose-Capillary: 577 mg/dL (ref 65–99)
Glucose-Capillary: >600 mg/dL (ref 65–99)
Glucose-Capillary: 146 mg/dL — ABNORMAL HIGH (ref 65–99)
Glucose-Capillary: 155 mg/dL — ABNORMAL HIGH (ref 65–99)
Glucose-Capillary: 170 mg/dL — ABNORMAL HIGH (ref 65–99)
Glucose-Capillary: >600 mg/dL (ref 65–99)
Glucose-Capillary: >600 mg/dL (ref 65–99)
Glucose-Capillary: >600 mg/dL (ref 65–99)
Glucose-Capillary: 169 mg/dL — ABNORMAL HIGH (ref 65–99)
Glucose-Capillary: 198 mg/dL — ABNORMAL HIGH (ref 65–99)
Glucose-Capillary: 122 mg/dL — ABNORMAL HIGH (ref 65–99)
Glucose-Capillary: 142 mg/dL — ABNORMAL HIGH (ref 65–99)
Glucose-Capillary: 181 mg/dL — ABNORMAL HIGH (ref 65–99)
Glucose-Capillary: 195 mg/dL — ABNORMAL HIGH (ref 65–99)
Glucose-Capillary: 249 mg/dL — ABNORMAL HIGH (ref 65–99)

## 2015-12-05 LAB — CBC
HCT: 44.6 % (ref 36.0–46.0)
HCT: 42.7 % (ref 36.0–46.0)
Hemoglobin: 15.1 g/dL — ABNORMAL HIGH (ref 12.0–15.0)
Hemoglobin: 14 g/dL (ref 12.0–15.0)
MCH: 26.3 pg (ref 26.0–34.0)
MCH: 25.9 pg — ABNORMAL LOW (ref 26.0–34.0)
MCHC: 33.9 g/dL (ref 30.0–36.0)
MCHC: 32.8 g/dL (ref 30.0–36.0)
MCV: 77.6 fL — ABNORMAL LOW (ref 78.0–100.0)
MCV: 78.9 fL (ref 78.0–100.0)
Platelets: 296 10*3/uL (ref 150–400)
Platelets: 317 10*3/uL (ref 150–400)
RBC: 5.75 MIL/uL — ABNORMAL HIGH (ref 3.87–5.11)
RBC: 5.41 MIL/uL — ABNORMAL HIGH (ref 3.87–5.11)
RDW: 15.6 % — ABNORMAL HIGH (ref 11.5–15.5)
RDW: 16.1 % — ABNORMAL HIGH (ref 11.5–15.5)
WBC: 13.1 10*3/uL — ABNORMAL HIGH (ref 4.0–10.5)
WBC: 17.4 10*3/uL — ABNORMAL HIGH (ref 4.0–10.5)

## 2015-12-05 LAB — BASIC METABOLIC PANEL
Anion gap: 15 (ref 5–15)
Anion gap: 14 (ref 5–15)
Anion gap: 10 (ref 5–15)
BUN: 66 mg/dL — ABNORMAL HIGH (ref 6–20)
BUN: 66 mg/dL — ABNORMAL HIGH (ref 6–20)
BUN: 66 mg/dL — ABNORMAL HIGH (ref 6–20)
CO2: 19 mmol/L — ABNORMAL LOW (ref 22–32)
CO2: 21 mmol/L — ABNORMAL LOW (ref 22–32)
CO2: 22 mmol/L (ref 22–32)
Calcium: 9.6 mg/dL (ref 8.9–10.3)
Calcium: 9.5 mg/dL (ref 8.9–10.3)
Calcium: 9.1 mg/dL (ref 8.9–10.3)
Chloride: 106 mmol/L (ref 101–111)
Chloride: 109 mmol/L (ref 101–111)
Chloride: 111 mmol/L (ref 101–111)
Creatinine, Ser: 2.32 mg/dL — ABNORMAL HIGH (ref 0.44–1.00)
Creatinine, Ser: 2.31 mg/dL — ABNORMAL HIGH (ref 0.44–1.00)
Creatinine, Ser: 2.38 mg/dL — ABNORMAL HIGH (ref 0.44–1.00)
GFR calc Af Amer: 25 mL/min — ABNORMAL LOW (ref >60)
GFR calc Af Amer: 25 mL/min — ABNORMAL LOW (ref >60)
GFR calc Af Amer: 24 mL/min — ABNORMAL LOW (ref >60)
GFR calc non Af Amer: 22 mL/min — ABNORMAL LOW (ref >60)
GFR calc non Af Amer: 22 mL/min — ABNORMAL LOW (ref >60)
GFR calc non Af Amer: 21 mL/min — ABNORMAL LOW (ref >60)
Glucose, Bld: 737 mg/dL (ref 65–99)
Glucose, Bld: 196 mg/dL — ABNORMAL HIGH (ref 65–99)
Glucose, Bld: 112 mg/dL — ABNORMAL HIGH (ref 65–99)
Potassium: 3 mmol/L — ABNORMAL LOW (ref 3.5–5.1)
Potassium: 3.8 mmol/L (ref 3.5–5.1)
Potassium: 3.9 mmol/L (ref 3.5–5.1)
Sodium: 140 mmol/L (ref 135–145)
Sodium: 144 mmol/L (ref 135–145)
Sodium: 143 mmol/L (ref 135–145)

## 2015-12-05 LAB — TROPONIN I
Troponin I: 0.07 ng/mL — ABNORMAL HIGH
Troponin I: 0.09 ng/mL — ABNORMAL HIGH (ref <0.031)
Troponin I: 0.08 ng/mL — ABNORMAL HIGH (ref <0.031)

## 2015-12-05 LAB — CBG MONITORING, ED: Glucose-Capillary: >600 mg/dL (ref 65–99)

## 2015-12-05 LAB — MRSA PCR SCREENING: MRSA by PCR: NEGATIVE

## 2015-12-05 LAB — TYPE AND SCREEN
ABO/RH(D): O POS
Antibody Screen: NEGATIVE

## 2015-12-05 MED ORDER — CLONIDINE HCL 0.2 MG PO TABS
0.2000 mg | ORAL_TABLET | Freq: Two times a day (BID) | ORAL | Status: DC
  Administered 2015-12-05 – 2015-12-09 (×9): 0.2 mg via ORAL
  Filled 2015-12-05 (×3): qty 1
  Filled 2015-12-05: qty 2
  Filled 2015-12-05 (×2): qty 1
  Filled 2015-12-05: qty 2
  Filled 2015-12-05: qty 1
  Filled 2015-12-05: qty 2

## 2015-12-05 MED ORDER — AMLODIPINE BESYLATE 10 MG PO TABS
10.0000 mg | ORAL_TABLET | Freq: Every day | ORAL | Status: DC
  Administered 2015-12-05 – 2015-12-09 (×5): 10 mg via ORAL
  Filled 2015-12-05 (×6): qty 1

## 2015-12-05 MED ORDER — DEXTROSE-NACL 5-0.45 % IV SOLN
INTRAVENOUS | Status: DC
  Administered 2015-12-05: 100 mL/h via INTRAVENOUS
  Administered 2015-12-05: 05:00:00 via INTRAVENOUS

## 2015-12-05 MED ORDER — POTASSIUM CHLORIDE 10 MEQ/100ML IV SOLN
10.0000 meq | INTRAVENOUS | Status: AC
  Administered 2015-12-05 (×4): 10 meq via INTRAVENOUS
  Filled 2015-12-05 (×4): qty 100

## 2015-12-05 MED ORDER — TERAZOSIN HCL 1 MG PO CAPS
1.0000 mg | ORAL_CAPSULE | Freq: Every day | ORAL | Status: DC
  Administered 2015-12-05 – 2015-12-08 (×4): 1 mg via ORAL
  Filled 2015-12-05 (×7): qty 1

## 2015-12-05 MED ORDER — ATORVASTATIN CALCIUM 40 MG PO TABS
40.0000 mg | ORAL_TABLET | Freq: Every day | ORAL | Status: DC
  Administered 2015-12-05 – 2015-12-08 (×4): 40 mg via ORAL
  Filled 2015-12-05 (×4): qty 1

## 2015-12-05 MED ORDER — INSULIN ASPART 100 UNIT/ML ~~LOC~~ SOLN
0.0000 [IU] | SUBCUTANEOUS | Status: DC
  Administered 2015-12-05: 2 [IU] via SUBCUTANEOUS
  Administered 2015-12-05: 3 [IU] via SUBCUTANEOUS
  Administered 2015-12-05: 2 [IU] via SUBCUTANEOUS
  Administered 2015-12-06: 3 [IU] via SUBCUTANEOUS

## 2015-12-05 MED ORDER — GLYCERIN (LAXATIVE) 2.1 G RE SUPP
1.0000 | Freq: Every day | RECTAL | Status: DC | PRN
  Administered 2015-12-06 – 2015-12-07 (×2): 1 via RECTAL
  Filled 2015-12-05 (×5): qty 1

## 2015-12-05 MED ORDER — IOHEXOL 300 MG/ML  SOLN
25.0000 mL | INTRAMUSCULAR | Status: AC
  Administered 2015-12-05 (×2): 25 mL via ORAL

## 2015-12-05 MED ORDER — MORPHINE SULFATE (PF) 2 MG/ML IV SOLN: 1.0000 mg | INTRAVENOUS | Status: DC | PRN

## 2015-12-05 MED ORDER — SODIUM CHLORIDE 0.9 % IV SOLN
INTRAVENOUS | Status: DC
  Filled 2015-12-05: qty 2.5

## 2015-12-05 MED ORDER — SODIUM CHLORIDE 0.9 % IV SOLN
INTRAVENOUS | Status: DC
  Administered 2015-12-05: 1000 mL via INTRAVENOUS
  Administered 2015-12-06: 04:00:00 via INTRAVENOUS

## 2015-12-05 MED ORDER — ONDANSETRON HCL 4 MG/2ML IJ SOLN
4.0000 mg | Freq: Four times a day (QID) | INTRAMUSCULAR | Status: DC | PRN
  Administered 2015-12-05: 4 mg via INTRAVENOUS
  Filled 2015-12-05: qty 2

## 2015-12-05 MED ORDER — INSULIN GLARGINE 100 UNIT/ML ~~LOC~~ SOLN
10.0000 [IU] | Freq: Every day | SUBCUTANEOUS | Status: DC
  Administered 2015-12-05 – 2015-12-06 (×2): 10 [IU] via SUBCUTANEOUS
  Filled 2015-12-05 (×2): qty 0.1

## 2015-12-05 MED ORDER — ISOSORBIDE MONONITRATE ER 60 MG PO TB24
120.0000 mg | ORAL_TABLET | Freq: Every day | ORAL | Status: DC
  Administered 2015-12-05 – 2015-12-09 (×5): 120 mg via ORAL
  Filled 2015-12-05 (×5): qty 2

## 2015-12-05 MED ORDER — METOPROLOL TARTRATE 50 MG PO TABS
50.0000 mg | ORAL_TABLET | Freq: Two times a day (BID) | ORAL | Status: DC
  Administered 2015-12-05 – 2015-12-09 (×9): 50 mg via ORAL
  Filled 2015-12-05 (×9): qty 1

## 2015-12-05 MED ORDER — LIVING WELL WITH DIABETES BOOK
Freq: Once | Status: AC
  Administered 2015-12-05: 12:00:00
  Filled 2015-12-05: qty 1

## 2015-12-05 NOTE — Progress Notes (Signed)
Inpatient Diabetes Program Recommendations  AACE/ADA: New Consensus Statement on Inpatient Glycemic Control (2015)  Target Ranges:  Prepandial:   less than 140 mg/dL      Peak postprandial:   less than 180 mg/dL (1-2 hours)      Critically ill patients:  140 - 180 mg/dL   Review of Glycemic Control  Diabetes history: new-onset  Inpatient Diabetes Program Recommendations:  HgbA1C: pending This coordinator met with patient and her husband to discuss new diagnosis of DM.  Patient states she is scared she's going to die.  We discussed diabetes management with proper nutrition, glucose monitoring, and taking medications as prescribed.  Discussed follow-up with PCP for DM management.  Patient asked questions about what to eat and how much.  We discussed basic carb counting and used the Diabetes Meal Planning Guide.  Patient will also view the diabetes videos 501-510 on the patient ed network.  Pt states she feels better after our discussion.  No further questions/concerns at this time.   Thank you  Raoul Pitch BSN, RN,CDE Inpatient Diabetes Coordinator [phone] (team pager)

## 2015-12-05 NOTE — Progress Notes (Signed)
Pt asking questions about diabetes - says her cat had it and she had to give it shots. Starting slowly with Diabetic teaching with exit care  Notes. Ordered consults with diabetic coordinator and [place]. Pt still on Insulin gtt with q 1 hr CBG's and D5.45 ns at 100 cc/hr. Discussed oral hygiene - both her and husband have poor dental care with broken, missing teeth. Says they have no dental insurance. Pt did c/o ABD discomfort awaiting ABD ultrasound this am. Pt checked with bladder scan at 0845 Scan showed 45 cc Pt up to [facility] and voided 75 cc yellow urine. No c/o burning or pain with urination.

## 2015-12-05 NOTE — Progress Notes (Signed)
Utilization review completed. Bertha Stanfill, RN, BSN. 

## 2015-12-05 NOTE — Plan of Care (Signed)
Problem: Food- and Nutrition-Related Knowledge Deficit (NB-1.1) Goal: Nutrition education Formal process to instruct or train a patient/client in a skill or to impart knowledge to help patients/clients voluntarily manage or modify food choices and eating behavior to maintain or improve health. Outcome: Completed/Met Date Met:  12/05/15  RD consulted for nutrition education regarding diabetes.     Lab Results  Component Value Date    HGBA1C 6.0* 07/07/2014    RD provided "Carbohydrate Counting for People with Diabetes" handout from the Academy of Nutrition and Dietetics. Discussed different food groups and their effects on blood sugar, emphasizing carbohydrate-containing foods. Provided list of carbohydrates and recommended serving sizes of common foods.  Discussed importance of controlled and consistent carbohydrate intake throughout the day. Provided examples of ways to balance meals/snacks and encouraged intake of high-fiber, whole grain complex carbohydrates. Teach back method used.  Expect fair compliance.  Body mass index is 30.22 kg/(m^2). Pt meets criteria for Obesity Class I based on current BMI.  Current diet order is NPO.   Labs and medications reviewed. No further nutrition interventions warranted at this time. If additional nutrition issues arise, please re-consult RD.  Arthur Holms, RD, LDN Pager #: [phone] After-Hours Pager #: [phone]

## 2015-12-05 NOTE — ED Notes (Signed)
Pt continues with CBG above 600 per [place]. Units to go up to 21.6 above the limit on the pump.

## 2015-12-05 NOTE — Telephone Encounter (Signed)
°  New Prob   Curerntly pt is in the hospital at [facility]. Pt was diagnosed with diabetes. Son calling to make Dr. Percival Spanish aware.

## 2015-12-05 NOTE — H&P (Signed)
[facility] History and Physical  Tamari Shillinglaw [id number] DOB: [birth year] DOA: 12/04/2015  Referring physician: Dr. Christy Gentles. PCP: [name], MD  Specialists: Dr. Percival Spanish. Cardiologist.  Chief Complaint: Weakness.  HPI: [name] is a 59 y.o. female with a history of nonischemic cardiomyopathy, hypertension, chronic kidney disease and previous history of GI bleed presents the ER because of weakness and dizziness. Patient states since yesterday morning patient has been feeling very weak and whenever she tries to walk she feels dizzy and almost fell. Patient found it difficult to walk because of the weakness. Patient also had some chest tightness. Has had some nausea. Patient also noticed some blood in the stools yesterday 3 episodes. In the ER patient was found to have blood sugar more than thousand with DKA. Patient has no previous diagnosis of diabetes. At this time patient has been admitted for new-onset diabetes with DKA. In addition patient is also followed her stool for occult blood positive. Patient's abdomen appears benign otherwise. Patient denies any shortness of breath at this time. Presently patient is chest pain-free. EKG showed nonspecific changes and chest x-ray is unremarkable.   Review of Systems: As presented in the history of presenting illness, rest negative.  Past Medical History  Diagnosis Date  . Diverticular disease   . Hypertensive heart disease     a. Renal Artery Duplex (8/15):  no RAS  . Chronic combined systolic and diastolic CHF (congestive heart failure) ([place])     a. Echo (06/2014): Severe LVH, EF 20-25%, no RWMA, Gr 3 DD, trivial MR, mild LAE, mild RVE, PASP 38 mmHg .>> b. Echo 2/16 EF 50-55%  . NICM (nonischemic cardiomyopathy) ([place])     a. Nuclear (06/2014): EF 32%, apical thinning, no definite scar or ischemia;  b. Echo (2/16):  Mild LVH, EF 50-55%, Gr 2 DD, no RWMA  . CKD (chronic kidney disease)   . PUD (peptic ulcer disease)   . Hypertension    . UTI (lower urinary tract infection)    Past Surgical History  Procedure Laterality Date  . None    . Dilation and curettage of uterus     Social History:  reports that she quit smoking about 17 months ago. She has never used smokeless tobacco. She reports that she does not drink alcohol or use illicit drugs. Where does patient live home. Can patient participate in ADLs? Yes.  Allergies  Allergen Reactions  . Hydralazine Swelling  . Other Other (See Comments)    Unknown blood pressure medication caused face swelling    Family History:  Family History  Problem Relation Age of Onset  . Coronary artery disease    . Hypertension    . Heart attack Paternal Grandfather   . Stroke Paternal Grandmother   . Hypertension Paternal Grandmother   . Kidney disease Mother       Prior to Admission medications   Medication Sig Start Date End Date Taking? Authorizing Provider  acetaminophen (TYLENOL) 500 MG tablet Take 1,000 mg by mouth every 6 (six) hours as needed for mild pain, moderate pain or headache. For pain   Yes Historical Provider, MD  amLODipine (NORVASC) 10 MG tablet Take 1 tablet (10 mg total) by mouth daily. 07/18/15  Yes Liliane Shi, PA-C  aspirin EC 81 MG EC tablet Take 1 tablet (81 mg total) by mouth daily. 07/12/14  Yes Almyra Deforest, PA  atorvastatin (LIPITOR) 40 MG tablet Take 1 tablet (40 mg total) by mouth daily at 6 PM. 07/18/15  Yes  Liliane Shi, PA-C  cloNIDine (CATAPRES) 0.2 MG tablet TAKE 1 TABLET (0.2 MG TOTAL) BY MOUTH 2 (TWO) TIMES DAILY. 10/22/15  Yes Minus Breeding, MD  furosemide (LASIX) 20 MG tablet TAKE 1 TABLET BY MOUTH EVERY DAY 07/08/15  Yes Minus Breeding, MD  isosorbide mononitrate (IMDUR) 120 MG 24 hr tablet Take 1 tablet (120 mg total) by mouth daily. 07/12/14  Yes Almyra Deforest, PA  metoprolol (LOPRESSOR) 50 MG tablet Take 1 tablet (50 mg total) by mouth 2 (two) times daily. 07/18/15  Yes Liliane Shi, PA-C  terazosin (HYTRIN) 1 MG capsule Take 1 capsule (1  mg total) by mouth at bedtime. 12/27/14  Yes Liliane Shi, PA-C    Physical Exam: Filed Vitals:   12/04/15 2245 12/04/15 2300 12/04/15 2315 12/05/15 0029  BP: 130/77 134/81 138/73   Pulse: 88 87 86   Temp:    97.5 F (36.4 C)  TempSrc:    Oral  Resp: 23 21 29    SpO2: 91% 96% 93%      General:  Moderately built and nourished.  Eyes: Anicteric no pallor.  ENT: No discharge from the ears eyes nose or mouth.  Neck: No mass felt.  Cardiovascular: S1 and S2 heard.  Respiratory: No rhonchi or crepitations.  Abdomen: Soft nontender bowel sounds present.  Skin: No rash.  Musculoskeletal: No edema.  Psychiatric: Appears normal.  Neurologic: Alert awake oriented to time place and person. Moves all extremities.  Labs on Admission:  Basic Metabolic Panel:  Recent Labs Lab 12/04/15 1945  NA 133*  K 4.5  CL 95*  CO2 18*  GLUCOSE 1172*  BUN 71*  CREATININE 2.58*  CALCIUM 9.7   Liver Function Tests:  Recent Labs Lab 12/04/15 1945  AST 22  ALT 28  ALKPHOS 244*  BILITOT 1.0  PROT 8.0  ALBUMIN 3.7    Recent Labs Lab 12/04/15 1945  LIPASE 1895*   No results for input(s): AMMONIA in the last 168 hours. CBC:  Recent Labs Lab 12/04/15 2127  WBC 12.1*  HGB 17.0*  HCT 46.6*  MCV 74.6*  PLT 293   Cardiac Enzymes:  Recent Labs Lab 12/04/15 2044  TROPONINI 0.08*    BNP (last 3 results) No results for input(s): BNP in the last 8760 hours.  ProBNP (last 3 results) No results for input(s): PROBNP in the last 8760 hours.  CBG:  Recent Labs Lab 12/04/15 2057  GLUCAP >600*    Radiological Exams on Admission: Dg Chest Portable 1 View  12/04/2015  CLINICAL DATA:  Cough and congestion, history CHF EXAM: PORTABLE CHEST 1 VIEW COMPARISON:  07/07/2014 FINDINGS: Cardiomegaly evident without current edema, CHF, focal pneumonia, collapse or consolidation. No significant effusion or pneumothorax. Tortuous elongated thoracic aorta. Slight rotation to the  left. Degenerative changes of the spine with an associated scoliosis. IMPRESSION: Cardiomegaly without CHF or pneumonia.  Chronic changes as above. Electronically Signed   By: Jerilynn Mages.  Shick M.D.   On: 12/04/2015 21:05    EKG: Independently reviewed. Normal sinus rhythm with nonspecific ST-T changes.  Assessment/Plan Principal Problem:   DKA (diabetic ketoacidoses) (HCC) Active Problems:   Chronic combined systolic and diastolic HF (heart failure) (HCC)   NICM (nonischemic cardiomyopathy) (HCC)   ARF (acute renal failure) (HCC)   Rectal bleeding   Elevated troponin   1. Diabetic ketoacidosis with new onset diabetes mellitus type 2 - patient has been started on insulin infusion. Since patient has history of nonischemic cardiomyopathy patient is receiving fluids cautiously.  Closely follow metabolic panel and once anion gap is corrected change to long-acting insulin. Check hemoglobin A1c. 2. Rectal bleeding - patient is hemodynamically stable. Has not had any large bloody bowel movement. Has had EGD and colonoscopy in 2012 by Dr. Benson Norway and Dr. Collene Mares which showed diverticulosis with internal/external hemorrhoids. Closely follow CBC. 3. Chest pain - patient had some pressure-like symptoms we will cycle cardiac markers. Holding of aspirin due to rectal bleeding. On statins. 4. Hypertension - will continue present medications. 5. Nonischemic cardiomyopathy - holding off diuretics due to #1 and renal failure. 6. Acute on chronic renal failure - probably secondary to #1. Closely monitor intake output and metabolic panel. 7. Hyperlipidemia on statins. 8. Leukocytosis - probably reactionary. Closely follow.  I have reviewed patient's old charts on labs.   DVT Prophylaxis SCDs due to rectal bleeding.  Code Status: Full code.  Family Communication: Patient's husband and son.  Disposition Plan: Admit to inpatient.    KAKRAKANDY,ARSHAD N. [facility] Pager [phone].  If 7PM-7AM, please contact  night-coverage [email or web address] Password [facility] 12/05/2015, 12:38 AM

## 2015-12-05 NOTE — Progress Notes (Addendum)
Patient seen and examined this morning, admitted overnight, H&P reviewed and agree with A/P  59 y.o. female with a history of nonischemic cardiomyopathy, hypertension, chronic kidney disease and previous history of GI bleed presents the ER because of weakness and dizziness, found to be in DKA  Diabetic ketoacidosis with new onset diabetes mellitus type 2 - continue insulin gtt, morning BMP pending.   Rectal bleeding  - intermittent, appears chronic per patient, she is very constipated at baseline and is associated with this. - monitor Hb, she is clinically stable - Has had EGD and colonoscopy in 2012 by Dr. Benson Norway and Dr. Collene Mares which showed diverticulosis with internal/external hemorrhoids Abdominal distention - ?ascites, patient with chronic progressive abdominal distention for past several months - obtain abdominal US  Chest pain  - patient had some pressure-like symptoms we will cycle cardiac markers. Holding of aspirin due to rectal bleeding. On statins.  Hypertension - will continue present medications.  Nonischemic cardiomyopathy  - holding off diuretics due to #1 and renal failure. - clinically dry  Acute on chronic renal failure  - probably secondary to #1. Closely monitor intake output and metabolic panel.  Hyperlipidemia on statins.  Leukocytosis - probably reactionary. Closely follow.  ?Acute pancreatitis - without characteristic abdominal pain, monitor. Keep NPO and IVF for today  - renal failure prohibits CT scan with contrast    Costin M. Cruzita Lederer, MD [facility] [phone]

## 2015-12-06 ENCOUNTER — Inpatient Hospital Stay (HOSPITAL_COMMUNITY): Payer: BLUE CROSS/BLUE SHIELD

## 2015-12-06 DIAGNOSIS — K859 Acute pancreatitis without necrosis or infection, unspecified: Secondary | ICD-10-CM | POA: Diagnosis present

## 2015-12-06 LAB — COMPREHENSIVE METABOLIC PANEL
ALT: 24 U/L (ref 14–54)
AST: 33 U/L (ref 15–41)
Albumin: 2.6 g/dL — ABNORMAL LOW (ref 3.5–5.0)
Alkaline Phosphatase: 134 U/L — ABNORMAL HIGH (ref 38–126)
Anion gap: 9 (ref 5–15)
BUN: 64 mg/dL — ABNORMAL HIGH (ref 6–20)
CO2: 19 mmol/L — ABNORMAL LOW (ref 22–32)
Calcium: 8.3 mg/dL — ABNORMAL LOW (ref 8.9–10.3)
Chloride: 109 mmol/L (ref 101–111)
Creatinine, Ser: 2.37 mg/dL — ABNORMAL HIGH (ref 0.44–1.00)
GFR calc Af Amer: 24 mL/min — ABNORMAL LOW (ref >60)
GFR calc non Af Amer: 21 mL/min — ABNORMAL LOW (ref >60)
Glucose, Bld: 213 mg/dL — ABNORMAL HIGH (ref 65–99)
Potassium: 4.5 mmol/L (ref 3.5–5.1)
Sodium: 137 mmol/L (ref 135–145)
Total Bilirubin: 0.7 mg/dL (ref 0.3–1.2)
Total Protein: 5.9 g/dL — ABNORMAL LOW (ref 6.5–8.1)

## 2015-12-06 LAB — GLUCOSE, CAPILLARY
Glucose-Capillary: 200 mg/dL — ABNORMAL HIGH (ref 65–99)
Glucose-Capillary: 202 mg/dL — ABNORMAL HIGH (ref 65–99)
Glucose-Capillary: 226 mg/dL — ABNORMAL HIGH (ref 65–99)
Glucose-Capillary: 304 mg/dL — ABNORMAL HIGH (ref 65–99)
Glucose-Capillary: 337 mg/dL — ABNORMAL HIGH (ref 65–99)
Glucose-Capillary: 429 mg/dL — ABNORMAL HIGH (ref 65–99)

## 2015-12-06 LAB — CBC
HCT: 39.7 % (ref 36.0–46.0)
Hemoglobin: 12.5 g/dL (ref 12.0–15.0)
MCH: 25.3 pg — ABNORMAL LOW (ref 26.0–34.0)
MCHC: 31.5 g/dL (ref 30.0–36.0)
MCV: 80.4 fL (ref 78.0–100.0)
Platelets: 229 10*3/uL (ref 150–400)
RBC: 4.94 MIL/uL (ref 3.87–5.11)
RDW: 16.6 % — ABNORMAL HIGH (ref 11.5–15.5)
WBC: 12.8 10*3/uL — ABNORMAL HIGH (ref 4.0–10.5)

## 2015-12-06 LAB — HEMOGLOBIN A1C
Hgb A1c MFr Bld: 14 % — ABNORMAL HIGH (ref 4.8–5.6)
Mean Plasma Glucose: 355 mg/dL

## 2015-12-06 LAB — LIPASE, BLOOD: Lipase: 354 U/L — ABNORMAL HIGH (ref 11–51)

## 2015-12-06 MED ORDER — INSULIN GLARGINE 100 UNIT/ML ~~LOC~~ SOLN
14.0000 [IU] | Freq: Every day | SUBCUTANEOUS | Status: DC
  Administered 2015-12-07 – 2015-12-09 (×3): 14 [IU] via SUBCUTANEOUS
  Filled 2015-12-06 (×3): qty 0.14

## 2015-12-06 MED ORDER — INSULIN ASPART 100 UNIT/ML ~~LOC~~ SOLN
3.0000 [IU] | Freq: Three times a day (TID) | SUBCUTANEOUS | Status: DC
  Administered 2015-12-06 – 2015-12-07 (×2): 3 [IU] via SUBCUTANEOUS

## 2015-12-06 MED ORDER — INSULIN ASPART 100 UNIT/ML ~~LOC~~ SOLN
0.0000 [IU] | SUBCUTANEOUS | Status: DC
  Administered 2015-12-06: 9 [IU] via SUBCUTANEOUS
  Administered 2015-12-06: 3 [IU] via SUBCUTANEOUS
  Administered 2015-12-06: 2 [IU] via SUBCUTANEOUS
  Administered 2015-12-06 – 2015-12-07 (×3): 7 [IU] via SUBCUTANEOUS
  Administered 2015-12-07: 5 [IU] via SUBCUTANEOUS
  Administered 2015-12-07: 1 [IU] via SUBCUTANEOUS
  Administered 2015-12-07: 5 [IU] via SUBCUTANEOUS
  Administered 2015-12-07: 9 [IU] via SUBCUTANEOUS
  Administered 2015-12-07: 1 [IU] via SUBCUTANEOUS
  Administered 2015-12-08: 7 [IU] via SUBCUTANEOUS
  Administered 2015-12-08: 1 [IU] via SUBCUTANEOUS
  Administered 2015-12-08 – 2015-12-09 (×4): 5 [IU] via SUBCUTANEOUS
  Administered 2015-12-09: 3 [IU] via SUBCUTANEOUS
  Administered 2015-12-09: 5 [IU] via SUBCUTANEOUS

## 2015-12-06 MED ORDER — ACETAMINOPHEN 325 MG PO TABS
650.0000 mg | ORAL_TABLET | Freq: Once | ORAL | Status: AC
  Administered 2015-12-06: 650 mg via ORAL
  Filled 2015-12-06: qty 2

## 2015-12-06 NOTE — Progress Notes (Signed)
Called report to [place] to [employer]. Husband aware of pending transfer

## 2015-12-06 NOTE — Progress Notes (Signed)
Pt transferred to [place] bed 15 with all belongings in w/c by Nurse tech  Husband aware of transfer

## 2015-12-06 NOTE — Progress Notes (Signed)
PROGRESS NOTE  [name] [id number] DOB: 07/13/1956 DOA: 12/04/2015 PCP: [name], MD  HPI: 59 y.o. female with a history of nonischemic cardiomyopathy, hypertension, chronic kidney disease and previous history of GI bleed presents the ER because of weakness and dizziness  Subjective / 24 H Interval events - feeling better this morning, she is hungry and wants to eat   Assessment/Plan: Principal Problem:   DKA (diabetic ketoacidoses) ([place]) Active Problems:   Chronic combined systolic and diastolic HF (heart failure) (HCC)   NICM (nonischemic cardiomyopathy) ([place])   ARF (acute renal failure) (HCC)   Rectal bleeding   Elevated troponin   Abdominal distention   AKI (acute kidney injury) ([place])   Pancreatitis, acute   Diabetic ketoacidosis with new onset diabetes mellitus type 2 - gap closed yesterday, transitioned to Lantus / SSI, CBGs still without optimal control - increase Lantus to 14 U from 10 - add scheduled mealtime insulin 3U in addition to the SSI - likely DKA precipitated by acute pancreatitis  Acute pancreatitis - evidenced by elevated lipase and CT findings - patient with very vague abdominal complaints not very typical of acute pancreatitis - improving, advance diet to clear liquids today  - unknown etiology, no ETOH, no new medications. CT scan without clear evidence of cholelithiasis however not the best test, will do limited RUQ [place] in am - obtain TG  Rectal bleeding  - intermittent, appears chronic per patient, she is very constipated at baseline and is associated with this. - monitor Hb, she is clinically stable - Has had EGD and colonoscopy in 2012 by Dr. Benson Norway and Dr. Collene Mares which showed diverticulosis with internal/external hemorrhoids  Abdominal distention - no ascites, suspect chronic abdominal distention  Chest pain  - patient had some pressure-like symptoms  - troponin flat 0.07 >> 0.09 >> 0.08, likely demand from pancreatitis and  DKA  Hypertension  - will continue present medications.  Nonischemic cardiomyopathy  - holding off diuretics due to #1 and renal failure. - decrease fluid rate today, no longer hemoconcentrated   Acute on chronic renal failure  - probably secondary to #1. Closely monitor intake output and metabolic panel. - renal function stable  Hyperlipidemia on statins.  Leukocytosis - probably reactionary. Closely follow.   Diet: Diet clear liquid Room service appropriate?: Yes; Fluid consistency:: Thin Fluids: NS DVT Prophylaxis: SCD  Code Status: Full Code Family Communication: d/w husband bedside  Disposition Plan: home vs SNF when ready, PT to evaluate   Barriers to discharge: pancreatitis   Consultants:  None   Procedures:  None    Antibiotics  Anti-infectives    None       Studies  Ct Abdomen Pelvis Wo Contrast  12/05/2015  CLINICAL DATA:  Abdominal distension with pain EXAM: CT ABDOMEN AND PELVIS WITHOUT CONTRAST TECHNIQUE: Multidetector CT imaging of the abdomen and pelvis was performed following the standard protocol without IV contrast. COMPARISON:  None. FINDINGS: Lung bases are free of acute infiltrate or sizable effusion. The liver is diffusely decreased in attenuation consistent with fatty infiltration. Some central areas of focal fatty sparing are noted. The spleen, adrenal glands and kidneys are within normal limits. Pancreas is well visualized and demonstrates some peripancreatic inflammatory changes with associated edema of the third portion of the duodenum. The majority of the body and tail of the pancreas is fatty infiltrated. Scattered diverticular changes noted. The bladder is partially distended. The uterus is within normal limits. No significant lymphadenopathy is noted. The appendix is within normal  limits. Laxity of the anterior abdominal wall is noted. A small fat containing umbilical hernia is seen. The bony structures show degenerative change of the  lumbar spine. IMPRESSION: Fatty liver. Changes of pancreatitis with focal irritation of the adjacent third portion of the duodenum. This corresponds with the patient's elevated lipase. Diverticulosis without diverticulitis. Electronically Signed   By: Inez Catalina M.D.   On: 12/05/2015 17:17   Dg Chest Portable 1 View  12/04/2015  CLINICAL DATA:  Cough and congestion, history CHF EXAM: PORTABLE CHEST 1 VIEW COMPARISON:  07/07/2014 FINDINGS: Cardiomegaly evident without current edema, CHF, focal pneumonia, collapse or consolidation. No significant effusion or pneumothorax. Tortuous elongated thoracic aorta. Slight rotation to the left. Degenerative changes of the spine with an associated scoliosis. IMPRESSION: Cardiomegaly without CHF or pneumonia.  Chronic changes as above. Electronically Signed   By: Jerilynn Mages.  Shick M.D.   On: 12/04/2015 21:05   Dg Abd Portable 2v  12/05/2015  CLINICAL DATA:  Abdominal distention and pain EXAM: PORTABLE ABDOMEN - 2 VIEW COMPARISON:  10/01/2012 FINDINGS: The bowel gas pattern is normal. There is no evidence of free air. No radio-opaque calculi or other significant radiographic abnormality is seen. IMPRESSION: Negative. Electronically Signed   By: Kerby Moors M.D.   On: 12/05/2015 14:07   Dg Foot 2 Views Left  12/06/2015  CLINICAL DATA:  59 year old female with left foot pain. EXAM: LEFT FOOT - 2 VIEW COMPARISON:  None. FINDINGS: Severe joint space narrowing and osteophytosis noted at the first MTP joint. There are some erosive type changes noted - likely erosive osteoarthritis but CT is not entirely excluded. There is no evidence of acute fracture or dislocation. Mild degenerative changes in the midfoot are noted as well as a small to moderate calcaneal spur. Diffuse soft tissue swelling is noted. IMPRESSION: Soft tissue swelling without acute bony abnormality. Severe joint space narrowing and erosive changes at the first MTP joint -question erosive osteoarthritis or  possibly gout. Electronically Signed   By: Margarette Canada M.D.   On: 12/06/2015 10:00   Dg Foot 2 Views Right  12/06/2015  CLINICAL DATA:  Increased foot deformity EXAM: RIGHT FOOT - 2 VIEW COMPARISON:  None. FINDINGS: Diffuse soft tissue swelling is noted. No acute fracture or dislocation is seen. Degenerative changes in the tarsal bones as well as the first MTP joint are noted. IMPRESSION: Degenerative change without acute abnormality. Electronically Signed   By: Inez Catalina M.D.   On: 12/06/2015 09:57    Objective  Filed Vitals:   12/06/15 0407 12/06/15 0802 12/06/15 0959 12/06/15 1200  BP: 106/65 128/70 127/70 103/43  Pulse: 66 66 72 63  Temp: 97.7 F (36.5 C) 98.2 F (36.8 C)  98 F (36.7 C)  TempSrc: Oral Oral  Oral  Resp: 20 24  18   Height:      Weight:      SpO2: 91% 98%  98%    Intake/Output Summary (Last 24 hours) at 12/06/15 1347 Last data filed at 12/06/15 1100  Gross per 24 hour  Intake   3250 ml  Output   1005 ml  Net   2245 ml   Filed Weights   12/05/15 0100  Weight: 84.9 kg (187 lb 2.7 oz)    Exam:  GENERAL: NAD  HEENT: no scleral icterus, PERRL, poor dentition  NECK: supple, no LAD  LUNGS: CTA biL, no wheezing  HEART: RRR without MRG  ABDOMEN: soft, non tender  EXTREMITIES: foot drop bilaterally, no cyanosis  NEUROLOGIC: non focal  Data Reviewed: Basic Metabolic Panel:  Recent Labs Lab 12/04/15 1945 12/05/15 0122 12/05/15 1015 12/05/15 1231 12/06/15 0607  NA 133* 140 144 143 137  K 4.5 3.0* 3.8 3.9 4.5  CL 95* 106 109 111 109  CO2 18* 19* 21* 22 19*  GLUCOSE 1172* 737* 196* 112* 213*  BUN 71* 66* 66* 66* 64*  CREATININE 2.58* 2.32* 2.31* 2.38* 2.37*  CALCIUM 9.7 9.6 9.5 9.1 8.3*   Liver Function Tests:  Recent Labs Lab 12/04/15 1945 12/06/15 0607  AST 22 33  ALT 28 24  ALKPHOS 244* 134*  BILITOT 1.0 0.7  PROT 8.0 5.9*  ALBUMIN 3.7 2.6*    Recent Labs Lab 12/04/15 1945 12/06/15 0607  LIPASE 1895* 354*    CBC:  Recent Labs Lab 12/04/15 2127 12/05/15 0122 12/05/15 1015 12/06/15 0607  WBC 12.1* 13.1* 17.4* 12.8*  HGB 17.0* 15.1* 14.0 12.5  HCT 46.6* 44.6 42.7 39.7  MCV 74.6* 77.6* 78.9 80.4  PLT 293 296 317 229   Cardiac Enzymes:  Recent Labs Lab 12/04/15 2044 12/05/15 0122 12/05/15 0723 12/05/15 1231  TROPONINI 0.08* 0.07* 0.09* 0.08*   CBG:  Recent Labs Lab 12/05/15 2020 12/06/15 0011 12/06/15 0409 12/06/15 0800 12/06/15 1154  GLUCAP 249* 226* 200* 202* 304*    Recent Results (from the past 240 hour(s))  MRSA PCR Screening     Status: None   Collection Time: 12/05/15  2:31 AM  Result Value Ref Range Status   MRSA by PCR NEGATIVE NEGATIVE Final    Comment:        The GeneXpert MRSA Assay (FDA approved for NASAL specimens only), is one component of a comprehensive MRSA colonization surveillance program. It is not intended to diagnose MRSA infection nor to guide or monitor treatment for MRSA infections.      Scheduled Meds: . amLODipine  10 mg Oral Daily  . atorvastatin  40 mg Oral q1800  . cloNIDine  0.2 mg Oral BID  . insulin aspart  0-9 Units Subcutaneous Q4H  . insulin glargine  10 Units Subcutaneous Daily  . isosorbide mononitrate  120 mg Oral Daily  . metoprolol  50 mg Oral BID  . terazosin  1 mg Oral QHS   Continuous Infusions: . sodium chloride 50 mL/hr at 12/06/15 0756    Marzetta Board, MD [facility] Pager [phone]. If 7 PM - 7 AM, please contact night-coverage at [email or web address], password [facility] 12/06/2015, 1:47 PM  LOS: 2 days

## 2015-12-07 ENCOUNTER — Inpatient Hospital Stay (HOSPITAL_COMMUNITY): Payer: BLUE CROSS/BLUE SHIELD

## 2015-12-07 LAB — BASIC METABOLIC PANEL
Anion gap: 7 (ref 5–15)
BUN: 42 mg/dL — ABNORMAL HIGH (ref 6–20)
CO2: 19 mmol/L — ABNORMAL LOW (ref 22–32)
Calcium: 8.5 mg/dL — ABNORMAL LOW (ref 8.9–10.3)
Chloride: 113 mmol/L — ABNORMAL HIGH (ref 101–111)
Creatinine, Ser: 1.93 mg/dL — ABNORMAL HIGH (ref 0.44–1.00)
GFR calc Af Amer: 31 mL/min — ABNORMAL LOW (ref >60)
GFR calc non Af Amer: 27 mL/min — ABNORMAL LOW (ref >60)
Glucose, Bld: 131 mg/dL — ABNORMAL HIGH (ref 65–99)
Potassium: 4 mmol/L (ref 3.5–5.1)
Sodium: 139 mmol/L (ref 135–145)

## 2015-12-07 LAB — CBC
HCT: 37.3 % (ref 36.0–46.0)
Hemoglobin: 12.3 g/dL (ref 12.0–15.0)
MCH: 26.2 pg (ref 26.0–34.0)
MCHC: 33 g/dL (ref 30.0–36.0)
MCV: 79.4 fL (ref 78.0–100.0)
Platelets: 194 10*3/uL (ref 150–400)
RBC: 4.7 MIL/uL (ref 3.87–5.11)
RDW: 16.5 % — ABNORMAL HIGH (ref 11.5–15.5)
WBC: 12.2 10*3/uL — ABNORMAL HIGH (ref 4.0–10.5)

## 2015-12-07 LAB — GLUCOSE, CAPILLARY
Glucose-Capillary: 344 mg/dL — ABNORMAL HIGH (ref 65–99)
Glucose-Capillary: 139 mg/dL — ABNORMAL HIGH (ref 65–99)
Glucose-Capillary: 150 mg/dL — ABNORMAL HIGH (ref 65–99)
Glucose-Capillary: 273 mg/dL — ABNORMAL HIGH (ref 65–99)
Glucose-Capillary: 310 mg/dL — ABNORMAL HIGH (ref 65–99)

## 2015-12-07 LAB — LIPID PANEL
Cholesterol: 81 mg/dL (ref 0–200)
HDL: 25 mg/dL — ABNORMAL LOW (ref >40)
LDL Cholesterol: 33 mg/dL (ref 0–99)
Total CHOL/HDL Ratio: 3.2 RATIO
Triglycerides: 114 mg/dL (ref <150)
VLDL: 23 mg/dL (ref 0–40)

## 2015-12-07 MED ORDER — FLEET ENEMA 7-19 GM/118ML RE ENEM
1.0000 | ENEMA | Freq: Once | RECTAL | Status: DC
  Filled 2015-12-07: qty 1

## 2015-12-07 MED ORDER — INSULIN ASPART 100 UNIT/ML ~~LOC~~ SOLN
5.0000 [IU] | Freq: Three times a day (TID) | SUBCUTANEOUS | Status: DC
  Administered 2015-12-07 – 2015-12-09 (×7): 5 [IU] via SUBCUTANEOUS

## 2015-12-07 MED ORDER — ACETAMINOPHEN 325 MG PO TABS
650.0000 mg | ORAL_TABLET | ORAL | Status: DC | PRN
  Administered 2015-12-07 – 2015-12-08 (×3): 650 mg via ORAL
  Filled 2015-12-07 (×4): qty 2

## 2015-12-07 NOTE — Evaluation (Signed)
Physical Therapy Evaluation Patient Details Name: [name] MRN: [id number] DOB: 01-10-1956 Today's Date: 12/07/2015   History of Present Illness  59 y.o. female with a history of nonischemic cardiomyopathy, hypertension, chronic kidney disease and previous history of GI bleed presents the ER because of weakness and dizziness; found to be in Diabetic ketoacidosis  Clinical Impression   Pt admitted with above diagnosis. Pt currently with functional limitations due to the deficits listed below (see PT Problem List).  Pt will benefit from skilled PT to increase their independence and safety with mobility to allow discharge to the venue listed below.       Follow Up Recommendations Home health PT;Supervision/Assistance - 24 hour (consider HHRN for chronic disease management)    Equipment Recommendations  Rolling walker with 5" wheels;3in1 (PT) (youth-sized)    Recommendations for Other Services       Precautions / Restrictions Precautions Precautions: Fall Precaution Comments: Fall risk significantly reduced with use of RW      Mobility  Bed Mobility Overal bed mobility: Needs Assistance Bed Mobility: Supine to Sit     Supine to sit: Min guard     General bed mobility comments: Cues for sidelie to sit technique as it is usually more tolerable with back pain  Transfers Overall transfer level: Needs assistance Equipment used: Quad cane (very small based) Transfers: Sit to/from Stand Sit to Stand: Min assist         General transfer comment: minsteady assist  Ambulation/Gait Ambulation/Gait assistance: Min guard (without physical contact) Ambulation Distance (Feet): 140 Feet Assistive device: Rolling walker (2 wheeled) Gait Pattern/deviations: Step-through pattern;Decreased stride length Gait velocity: slowed   General Gait Details: Initially somewhat unsteady with unilateral support of cane; switched to youth-sized RW, and pt much more confident with amb; sized RW  for optiaml fit; dyspnea 2/4 at end of amb; pt attributes this to her CHF -- no acute distress  Stairs            Wheelchair Mobility    Modified Rankin (Stroke Patients Only)       Balance Overall balance assessment: Needs assistance           Standing balance-Leahy Scale: Fair                               Pertinent Vitals/Pain Pain Assessment: 0-10 Pain Score: 7  Pain Location: Low back pain Pain Descriptors / Indicators: Aching Pain Intervention(s): Monitored during session;Repositioned    Home Living Family/patient expects to be discharged to:: Private residence Living Arrangements: Spouse/significant other Available Help at Discharge: Family;Available 24 hours/day Type of Home: House Home Access: Stairs to enter Entrance Stairs-Rails:  (to be determined) Entrance Stairs-Number of Steps: 4 Home Layout: One level Home Equipment: Cane - single point      Prior Function Level of Independence: Independent with assistive device(s)         Comments: Independent with cane, but short distance only     Hand Dominance        Extremity/Trunk Assessment   Upper Extremity Assessment: Generalized weakness           Lower Extremity Assessment: Generalized weakness         Communication   Communication: No difficulties  Cognition Arousal/Alertness: Awake/alert Behavior During Therapy: WFL for tasks assessed/performed Overall Cognitive Status: Within Functional Limits for tasks assessed  General Comments      Exercises        Assessment/Plan    PT Assessment Patient needs continued PT services  PT Diagnosis Generalized weakness   PT Problem List Decreased strength;Decreased range of motion;Decreased activity tolerance;Decreased balance;Decreased mobility;Decreased knowledge of use of DME;Decreased knowledge of precautions;Pain;Cardiopulmonary status limiting activity  PT Treatment Interventions  DME instruction;Gait training;Stair training;Functional mobility training;Therapeutic activities;Therapeutic exercise;Cognitive remediation   PT Goals (Current goals can be found in the Care Plan section) Acute Rehab PT Goals Patient Stated Goal: Hopes to be home for Christmas PT Goal Formulation: With patient Time For Goal Achievement: 12/21/15 Potential to Achieve Goals: Good    Frequency Min 3X/week   Barriers to discharge        Co-evaluation               End of Session Equipment Utilized During Treatment: Gait belt Activity Tolerance: Patient tolerated treatment well Patient left: in chair;with family/visitor present (she chose for her cahir to be rolled to window to look out) Nurse Communication: Mobility status         Time: 1449-1511 PT Time Calculation (min) (ACUTE ONLY): 22 min   Charges:   PT Evaluation $Initial PT Evaluation Tier I: 1 Procedure     PT G CodesQuin Hoop 12/07/2015, 3:28 PM  Roney Marion, [place] Pager [phone] Office [phone]

## 2015-12-07 NOTE — Progress Notes (Signed)
CBG 429.  Patient also complaining of back pain, but did not want to take morphine.  Requested tylenol.  Notified MD on-call.  New orders received and administered.  Will continue to monitor.

## 2015-12-07 NOTE — Progress Notes (Signed)
PROGRESS NOTE  [name] [id number] DOB: 01/16/1956 DOA: 12/04/2015 PCP: [name], MD  HPI: 59 y.o. female with a history of nonischemic cardiomyopathy, hypertension, chronic kidney disease and previous history of GI bleed presents the ER because of weakness and dizziness  Subjective / 24 H Interval events - complains of back pain   Assessment/Plan: Principal Problem:   DKA (diabetic ketoacidoses) ([place]) Active Problems:   Chronic combined systolic and diastolic HF (heart failure) (HCC)   NICM (nonischemic cardiomyopathy) ([place])   ARF (acute renal failure) (HCC)   Rectal bleeding   Elevated troponin   Abdominal distention   AKI (acute kidney injury) ([place])   Pancreatitis, acute   Diabetic ketoacidosis with new onset diabetes mellitus type 2 - gap closed , transitioned to Lantus / SSI, CBGs still without optimal control - on Lantus 14 U, good fastik CBG today, continue - increase mealtime insulin to 5U in addition to the SSI due to progressive elevation of her CBGs during the day yesterday - DKA precipitated by acute pancreatitis  Acute pancreatitis - evidenced by elevated lipase and CT findings - patient with very vague abdominal complaints not very typical of acute pancreatitis - improving, advance diet to full liquids today  - unknown etiology, no ETOH, no new medications. CT scan without clear evidence of cholelithiasis, RUQ [place] without gallstones - TG normal  Rectal bleeding  - intermittent, appears chronic per patient, she is very constipated at baseline and is associated with this. - monitor Hb, she is clinically stable - Has had EGD and colonoscopy in 2012 by Dr. Benson Norway and Dr. Collene Mares which showed diverticulosis with internal/external hemorrhoids  Abdominal distention - no ascites, suspect chronic abdominal distention  Chest pain  - patient had some pressure-like symptoms  - troponin flat 0.07 >> 0.09 >> 0.08, likely demand from pancreatitis and  DKA  Hypertension  - will continue present medications.  Nonischemic cardiomyopathy  - holding off diuretics due to #1 and renal failure. - stop fluids today   Acute on chronic renal failure  - probably secondary to #1. Closely monitor intake output and metabolic panel. - renal function stable  Hyperlipidemia on statins.  Leukocytosis - probably reactionary. Closely follow.  Back pain - plain XR  Diet: Diet full liquid Room service appropriate?: Yes; Fluid consistency:: Thin Fluids: NS DVT Prophylaxis: SCD  Code Status: Full Code Family Communication: d/w husband bedside  Disposition Plan: home vs SNF when ready, PT to evaluate   Barriers to discharge: pancreatitis   Consultants:  None   Procedures:  None    Antibiotics  Anti-infectives    None       Studies  Ct Abdomen Pelvis Wo Contrast  12/05/2015  CLINICAL DATA:  Abdominal distension with pain EXAM: CT ABDOMEN AND PELVIS WITHOUT CONTRAST TECHNIQUE: Multidetector CT imaging of the abdomen and pelvis was performed following the standard protocol without IV contrast. COMPARISON:  None. FINDINGS: Lung bases are free of acute infiltrate or sizable effusion. The liver is diffusely decreased in attenuation consistent with fatty infiltration. Some central areas of focal fatty sparing are noted. The spleen, adrenal glands and kidneys are within normal limits. Pancreas is well visualized and demonstrates some peripancreatic inflammatory changes with associated edema of the third portion of the duodenum. The majority of the body and tail of the pancreas is fatty infiltrated. Scattered diverticular changes noted. The bladder is partially distended. The uterus is within normal limits. No significant lymphadenopathy is noted. The appendix is within normal limits. Laxity  of the anterior abdominal wall is noted. A small fat containing umbilical hernia is seen. The bony structures show degenerative change of the lumbar spine.  IMPRESSION: Fatty liver. Changes of pancreatitis with focal irritation of the adjacent third portion of the duodenum. This corresponds with the patient's elevated lipase. Diverticulosis without diverticulitis. Electronically Signed   By: Inez Catalina M.D.   On: 12/05/2015 17:17   Dg Abd Portable 2v  12/05/2015  CLINICAL DATA:  Abdominal distention and pain EXAM: PORTABLE ABDOMEN - 2 VIEW COMPARISON:  10/01/2012 FINDINGS: The bowel gas pattern is normal. There is no evidence of free air. No radio-opaque calculi or other significant radiographic abnormality is seen. IMPRESSION: Negative. Electronically Signed   By: Kerby Moors M.D.   On: 12/05/2015 14:07   Dg Foot 2 Views Left  12/06/2015  CLINICAL DATA:  59 year old female with left foot pain. EXAM: LEFT FOOT - 2 VIEW COMPARISON:  None. FINDINGS: Severe joint space narrowing and osteophytosis noted at the first MTP joint. There are some erosive type changes noted - likely erosive osteoarthritis but CT is not entirely excluded. There is no evidence of acute fracture or dislocation. Mild degenerative changes in the midfoot are noted as well as a small to moderate calcaneal spur. Diffuse soft tissue swelling is noted. IMPRESSION: Soft tissue swelling without acute bony abnormality. Severe joint space narrowing and erosive changes at the first MTP joint -question erosive osteoarthritis or possibly gout. Electronically Signed   By: Margarette Canada M.D.   On: 12/06/2015 10:00   Dg Foot 2 Views Right  12/06/2015  CLINICAL DATA:  Increased foot deformity EXAM: RIGHT FOOT - 2 VIEW COMPARISON:  None. FINDINGS: Diffuse soft tissue swelling is noted. No acute fracture or dislocation is seen. Degenerative changes in the tarsal bones as well as the first MTP joint are noted. IMPRESSION: Degenerative change without acute abnormality. Electronically Signed   By: Inez Catalina M.D.   On: 12/06/2015 09:57   US Abdomen Limited Ruq  12/06/2015  CLINICAL DATA:  59 year old  female with a history of pancreatitis EXAM: US ABDOMEN LIMITED - RIGHT UPPER QUADRANT COMPARISON:  CT 12/05/2015 FINDINGS: Gallbladder: No gallstones or wall thickening visualized. No sonographic Murphy sign noted. Common bile duct: Diameter: 5 mm Liver: Diffusely heterogeneous echotexture of liver, compatible with findings on recent CT of fatty liver. IMPRESSION: Sonographic survey demonstrates changes of steatosis, present on prior CT. Signed, Dulcy Fanny. Earleen Newport, DO [facility] Electronically Signed   By: Corrie Mckusick D.O.   On: 12/06/2015 18:54    Objective  Filed Vitals:   12/06/15 1723 12/06/15 2159 12/07/15 0416 12/07/15 0856  BP: 123/62 138/83 119/72 133/98  Pulse: 72 84 65 66  Temp: 98.3 F (36.8 C) 99.6 F (37.6 C) 98.5 F (36.9 C) 98.4 F (36.9 C)  TempSrc: Oral Oral Oral Oral  Resp: 18 16 18 18   Height:      Weight:      SpO2: 98% 98% 97% 99%    Intake/Output Summary (Last 24 hours) at 12/07/15 1402 Last data filed at 12/07/15 0856  Gross per 24 hour  Intake   1400 ml  Output    500 ml  Net    900 ml   Filed Weights   12/05/15 0100  Weight: 84.9 kg (187 lb 2.7 oz)    Exam:  GENERAL: NAD  HEENT: no scleral icterus, PERRL, poor dentition  NECK: supple, no LAD  LUNGS: CTA biL, no wheezing  HEART: RRR without MRG  ABDOMEN: soft, non tender  EXTREMITIES: foot drop bilaterally, no cyanosis   NEUROLOGIC: non focal  Data Reviewed: Basic Metabolic Panel:  Recent Labs Lab 12/05/15 0122 12/05/15 1015 12/05/15 1231 12/06/15 0607 12/07/15 0449  NA 140 144 143 137 139  K 3.0* 3.8 3.9 4.5 4.0  CL 106 109 111 109 113*  CO2 19* 21* 22 19* 19*  GLUCOSE 737* 196* 112* 213* 131*  BUN 66* 66* 66* 64* 42*  CREATININE 2.32* 2.31* 2.38* 2.37* 1.93*  CALCIUM 9.6 9.5 9.1 8.3* 8.5*   Liver Function Tests:  Recent Labs Lab 12/04/15 1945 12/06/15 0607  AST 22 33  ALT 28 24  ALKPHOS 244* 134*  BILITOT  1.0 0.7  PROT 8.0 5.9*  ALBUMIN 3.7 2.6*    Recent Labs Lab 12/04/15 1945 12/06/15 0607  LIPASE 1895* 354*   CBC:  Recent Labs Lab 12/04/15 2127 12/05/15 0122 12/05/15 1015 12/06/15 0607 12/07/15 0449  WBC 12.1* 13.1* 17.4* 12.8* 12.2*  HGB 17.0* 15.1* 14.0 12.5 12.3  HCT 46.6* 44.6 42.7 39.7 37.3  MCV 74.6* 77.6* 78.9 80.4 79.4  PLT 293 296 317 229 194   Cardiac Enzymes:  Recent Labs Lab 12/04/15 2044 12/05/15 0122 12/05/15 0723 12/05/15 1231  TROPONINI 0.08* 0.07* 0.09* 0.08*   CBG:  Recent Labs Lab 12/06/15 2149 12/07/15 0101 12/07/15 0419 12/07/15 0759 12/07/15 1125  GLUCAP 429* 344* 139* 150* 273*    Recent Results (from the past 240 hour(s))  MRSA PCR Screening     Status: None   Collection Time: 12/05/15  2:31 AM  Result Value Ref Range Status   MRSA by PCR NEGATIVE NEGATIVE Final    Comment:        The GeneXpert MRSA Assay (FDA approved for NASAL specimens only), is one component of a comprehensive MRSA colonization surveillance program. It is not intended to diagnose MRSA infection nor to guide or monitor treatment for MRSA infections.      Scheduled Meds: . amLODipine  10 mg Oral Daily  . atorvastatin  40 mg Oral q1800  . cloNIDine  0.2 mg Oral BID  . insulin aspart  0-9 Units Subcutaneous Q4H  . insulin aspart  5 Units Subcutaneous TID WC  . insulin glargine  14 Units Subcutaneous Daily  . isosorbide mononitrate  120 mg Oral Daily  . metoprolol  50 mg Oral BID  . terazosin  1 mg Oral QHS   Continuous Infusions:    Marzetta Board, MD [facility] Pager [phone]. If 7 PM - 7 AM, please contact night-coverage at [email or web address], password [facility] 12/07/2015, 2:02 PM  LOS: 3 days

## 2015-12-08 LAB — BASIC METABOLIC PANEL
Anion gap: 10 (ref 5–15)
BUN: 34 mg/dL — ABNORMAL HIGH (ref 6–20)
CO2: 16 mmol/L — ABNORMAL LOW (ref 22–32)
Calcium: 8.2 mg/dL — ABNORMAL LOW (ref 8.9–10.3)
Chloride: 114 mmol/L — ABNORMAL HIGH (ref 101–111)
Creatinine, Ser: 1.82 mg/dL — ABNORMAL HIGH (ref 0.44–1.00)
GFR calc Af Amer: 33 mL/min — ABNORMAL LOW (ref >60)
GFR calc non Af Amer: 29 mL/min — ABNORMAL LOW (ref >60)
Glucose, Bld: 131 mg/dL — ABNORMAL HIGH (ref 65–99)
Potassium: 4.5 mmol/L (ref 3.5–5.1)
Sodium: 140 mmol/L (ref 135–145)

## 2015-12-08 LAB — GLUCOSE, CAPILLARY
Glucose-Capillary: 121 mg/dL — ABNORMAL HIGH (ref 65–99)
Glucose-Capillary: 141 mg/dL — ABNORMAL HIGH (ref 65–99)
Glucose-Capillary: 228 mg/dL — ABNORMAL HIGH (ref 65–99)
Glucose-Capillary: 251 mg/dL — ABNORMAL HIGH (ref 65–99)
Glucose-Capillary: 272 mg/dL — ABNORMAL HIGH (ref 65–99)
Glucose-Capillary: 282 mg/dL — ABNORMAL HIGH (ref 65–99)
Glucose-Capillary: 302 mg/dL — ABNORMAL HIGH (ref 65–99)
Glucose-Capillary: 309 mg/dL — ABNORMAL HIGH (ref 65–99)

## 2015-12-08 LAB — CBC
HCT: 35.5 % — ABNORMAL LOW (ref 36.0–46.0)
Hemoglobin: 11.3 g/dL — ABNORMAL LOW (ref 12.0–15.0)
MCH: 25.7 pg — ABNORMAL LOW (ref 26.0–34.0)
MCHC: 31.8 g/dL (ref 30.0–36.0)
MCV: 80.9 fL (ref 78.0–100.0)
Platelets: 176 10*3/uL (ref 150–400)
RBC: 4.39 MIL/uL (ref 3.87–5.11)
RDW: 16.4 % — ABNORMAL HIGH (ref 11.5–15.5)
WBC: 11.9 10*3/uL — ABNORMAL HIGH (ref 4.0–10.5)

## 2015-12-08 MED ORDER — BLOOD GLUCOSE MONITOR KIT: PACK | Status: DC

## 2015-12-08 MED ORDER — INSULIN STARTER KIT- SYRINGES (ENGLISH)
1.0000 | Freq: Once | Status: DC
  Filled 2015-12-08: qty 1

## 2015-12-08 MED ORDER — INSULIN ASPART 100 UNIT/ML FLEXPEN: 6.0000 [IU] | PEN_INJECTOR | Freq: Three times a day (TID) | SUBCUTANEOUS | Status: DC

## 2015-12-08 MED ORDER — LIVING WELL WITH DIABETES BOOK
Freq: Once | Status: DC
  Filled 2015-12-08: qty 1

## 2015-12-08 MED ORDER — INSULIN STARTER KIT- PEN NEEDLES (ENGLISH)
1.0000 | Freq: Once | Status: DC
  Filled 2015-12-08: qty 1

## 2015-12-08 MED ORDER — INSULIN GLARGINE 100 UNIT/ML SOLOSTAR PEN: 15.0000 [IU] | PEN_INJECTOR | Freq: Every day | SUBCUTANEOUS | Status: DC

## 2015-12-08 NOTE — Progress Notes (Signed)
Inpatient Diabetes Program Recommendations  AACE/ADA: New Consensus Statement on Inpatient Glycemic Control (2015)  Target Ranges:  Prepandial:   less than 140 mg/dL      Peak postprandial:   less than 180 mg/dL (1-2 hours)      Critically ill patients:  140 - 180 mg/dL   Review of Glycemic Control  Inpatient Diabetes Program Recommendations:  Received consult for insulin teaching, new onset type 2 dm. Will order insulin starter kits today and  RN's can begin teaching using starter kit info, vial of saline, and ed'l videos per system network. Patient also to check own cbg's while here. Will order dietician consult as well.  Thank you Rosita Kea, RN, MSN, CDE  Diabetes Inpatient Program Office: [phone] Pager: [phone] 8:00 am to 5:00 pm

## 2015-12-08 NOTE — Progress Notes (Signed)
Late entry:  Patient was complaining of constipation earlier in shift.  MD on-call notified; new orders received for fleet enema administration.  Patient made aware of order and requested to try glycerin suppository again.  She stated if suppository does not work, then she will try the enema.  Will continue to monitor.

## 2015-12-08 NOTE — Progress Notes (Signed)
PROGRESS NOTE  [name] [id number] DOB: 1956-08-22 DOA: 12/04/2015 PCP: [name], MD  HPI: 59 y.o. female with a history of nonischemic cardiomyopathy, hypertension, chronic kidney disease and previous history of GI bleed presents the ER because of weakness and dizziness  Subjective / 24 H Interval events - feeling much improved today   Assessment/Plan: Principal Problem:   DKA (diabetic ketoacidoses) ([place]) Active Problems:   Chronic combined systolic and diastolic HF (heart failure) (H[facility])   NICM (nonischemic cardiomyopathy) ([place])   ARF (acute renal failure) (HCC)   Rectal bleeding   Elevated troponin   Abdominal distention   AKI (acute kidney injury) ([place])   Pancreatitis, acute   Diabetic ketoacidosis with new onset diabetes mellitus type 2 - gap closed , transitioned to Lantus / SSI, CBGs still without optimal control - on Lantus 14 U, good fasting CBGs, continue mealtime insulin as well - DKA precipitated by acute pancreatitis  Acute pancreatitis - evidenced by elevated lipase and CT findings - patient with very vague abdominal complaints not very typical of acute pancreatitis - improving, advance diet to soft - unknown etiology, no ETOH, no new medications. CT scan without clear evidence of cholelithiasis, RUQ [place] without gallstones - TG normal  Rectal bleeding  - intermittent, appears chronic per patient, she is very constipated at baseline and is associated with this. - monitor Hb, she is clinically stable - Has had EGD and colonoscopy in 2012 by Dr. Benson Norway and Dr. Collene Mares which showed diverticulosis with internal/external hemorrhoids  Abdominal distention - no ascites, suspect chronic abdominal distention  Chest pain  - patient had some pressure-like symptoms  - troponin flat 0.07 >> 0.09 >> 0.08, likely demand from pancreatitis and DKA  Hypertension  - will continue present medications.  Nonischemic cardiomyopathy  - holding off diuretics due to #1  and renal failure.  Acute on chronic renal failure  - probably secondary to #1. Closely monitor intake output and metabolic panel. - renal function stable  Hyperlipidemia on statins.  Leukocytosis - probably reactionary. Closely follow.  Back pain - plain XR  Diet: DIET SOFT Room service appropriate?: Yes; Fluid consistency:: Thin Fluids: NS DVT Prophylaxis: SCD  Code Status: Full Code Family Communication: d/w husband bedside  Disposition Plan: home in am if she tolerates diet and CBGs under control Barriers to discharge: pancreatitis   Consultants:  None   Procedures:  None    Antibiotics  Anti-infectives    None       Studies  Dg Lumbar Spine 2-3 Views  12/07/2015  CLINICAL DATA:  Low back pain especially on RIGHT side but starting to radiate to LEFT side and stomach, abdominal distension EXAM: LUMBAR SPINE - 2-3 VIEW COMPARISON:  None FINDINGS: Retained contrast in colon is superimposed with the spine on AP view. 4 non-rib-bearing segments. Bones demineralized. Minimal degenerative disc disease changes with scattered disc space narrowing and tiny endplate spurs. Vertebral body heights maintained without fracture or subluxation. IMPRESSION: Osseous demineralization with mild degenerative disc disease changes. No definite acute abnormalities. Electronically Signed   By: Lavonia Dana M.D.   On: 12/07/2015 16:48   US Abdomen Limited Ruq  12/06/2015  CLINICAL DATA:  59 year old female with a history of pancreatitis EXAM: US ABDOMEN LIMITED - RIGHT UPPER QUADRANT COMPARISON:  CT 12/05/2015 FINDINGS: Gallbladder: No gallstones or wall thickening visualized. No sonographic Murphy sign noted. Common bile duct: Diameter: 5 mm Liver: Diffusely heterogeneous echotexture of liver, compatible with findings on recent CT of fatty liver. IMPRESSION:  Sonographic survey demonstrates changes of steatosis, present on prior CT. Signed, Dulcy Fanny. Earleen Newport, DO [facility] Electronically Signed   By: Corrie Mckusick D.O.   On: 12/06/2015 18:54    Objective  Filed Vitals:   12/07/15 0856 12/07/15 1704 12/07/15 2330 12/08/15 0422  BP: 133/98 119/70 139/84 116/57  Pulse: 66 59 69 54  Temp: 98.4 F (36.9 C) 98.8 F (37.1 C) 98.4 F (36.9 C) 98.1 F (36.7 C)  TempSrc: Oral Oral Oral Oral  Resp: 18 18 20 16   Height:      Weight:      SpO2: 99% 99% 98% 95%    Intake/Output Summary (Last 24 hours) at 12/08/15 1611 Last data filed at 12/08/15 1300  Gross per 24 hour  Intake   1560 ml  Output    703 ml  Net    857 ml   Filed Weights   12/05/15 0100  Weight: 84.9 kg (187 lb 2.7 oz)    Exam:  GENERAL: NAD  HEENT: no scleral icterus, PERRL, poor dentition  NECK: supple, no LAD  LUNGS: CTA biL, no wheezing  HEART: RRR without MRG  ABDOMEN: soft, non tender  EXTREMITIES: foot drop bilaterally, no cyanosis   NEUROLOGIC: non focal  Data Reviewed: Basic Metabolic Panel:  Recent Labs Lab 12/05/15 1015 12/05/15 1231 12/06/15 0607 12/07/15 0449 12/08/15 0512  NA 144 143 137 139 140  K 3.8 3.9 4.5 4.0 4.5  CL 109 111 109 113* 114*  CO2 21* 22 19* 19* 16*  GLUCOSE 196* 112* 213* 131* 131*  BUN 66* 66* 64* 42* 34*  CREATININE 2.31* 2.38* 2.37* 1.93* 1.82*  CALCIUM 9.5 9.1 8.3* 8.5* 8.2*   Liver Function Tests:  Recent Labs Lab 12/04/15 1945 12/06/15 0607  AST 22 33  ALT 28 24  ALKPHOS 244* 134*  BILITOT 1.0 0.7  PROT 8.0 5.9*  ALBUMIN 3.7 2.6*    Recent Labs Lab 12/04/15 1945 12/06/15 0607  LIPASE 1895* 354*   CBC:  Recent Labs Lab 12/05/15 0122 12/05/15 1015 12/06/15 0607 12/07/15 0449 12/08/15 0512  WBC 13.1* 17.4* 12.8* 12.2* 11.9*  HGB 15.1* 14.0 12.5 12.3 11.3*  HCT 44.6 42.7 39.7 37.3 35.5*  MCV 77.6* 78.9 80.4 79.4 80.9  PLT 296 317 229 194 176   Cardiac Enzymes:  Recent Labs Lab 12/04/15 2044 12/05/15 0122 12/05/15 0723 12/05/15 1231  TROPONINI  0.08* 0.07* 0.09* 0.08*   CBG:  Recent Labs Lab 12/07/15 2228 12/07/15 2331 12/08/15 0426 12/08/15 0754 12/08/15 1139  GLUCAP 309* 272* 121* 141* 282*    Recent Results (from the past 240 hour(s))  MRSA PCR Screening     Status: None   Collection Time: 12/05/15  2:31 AM  Result Value Ref Range Status   MRSA by PCR NEGATIVE NEGATIVE Final    Comment:        The GeneXpert MRSA Assay (FDA approved for NASAL specimens only), is one component of a comprehensive MRSA colonization surveillance program. It is not intended to diagnose MRSA infection nor to guide or monitor treatment for MRSA infections.      Scheduled Meds: . amLODipine  10 mg Oral Daily  . atorvastatin  40 mg Oral q1800  . cloNIDine  0.2 mg Oral BID  . insulin aspart  0-9 Units Subcutaneous Q4H  . insulin aspart  5 Units Subcutaneous TID WC  . insulin glargine  14 Units Subcutaneous Daily  . isosorbide mononitrate  120  mg Oral Daily  . metoprolol  50 mg Oral BID  . sodium phosphate  1 enema Rectal Once  . terazosin  1 mg Oral QHS   Continuous Infusions:    Marzetta Board, MD [facility] Pager [phone]. If 7 PM - 7 AM, please contact night-coverage at [email or web address], password [facility] 12/08/2015, 4:11 PM  LOS: 4 days

## 2015-12-09 DIAGNOSIS — N184 Chronic kidney disease, stage 4 (severe): Secondary | ICD-10-CM | POA: Diagnosis present

## 2015-12-09 LAB — GLUCOSE, CAPILLARY
Glucose-Capillary: 267 mg/dL — ABNORMAL HIGH (ref 65–99)
Glucose-Capillary: 276 mg/dL — ABNORMAL HIGH (ref 65–99)
Glucose-Capillary: 276 mg/dL — ABNORMAL HIGH (ref 65–99)
Glucose-Capillary: 246 mg/dL — ABNORMAL HIGH (ref 65–99)
Glucose-Capillary: 251 mg/dL — ABNORMAL HIGH (ref 65–99)
Glucose-Capillary: 271 mg/dL — ABNORMAL HIGH (ref 65–99)

## 2015-12-09 MED ORDER — LIVING BETTER WITH HEART FAILURE BOOK: Freq: Once | Status: DC

## 2015-12-09 MED ORDER — INSULIN STARTER KIT- PEN NEEDLES (ENGLISH)
1.0000 | Freq: Once | Status: DC
  Filled 2015-12-09 (×2): qty 1

## 2015-12-09 MED ORDER — INSULIN GLARGINE 100 UNIT/ML SOLOSTAR PEN: 15.0000 [IU] | PEN_INJECTOR | Freq: Every day | SUBCUTANEOUS | Status: DC

## 2015-12-09 MED ORDER — BLOOD GLUCOSE MONITOR KIT: PACK | Status: AC

## 2015-12-09 MED ORDER — INSULIN ASPART 100 UNIT/ML FLEXPEN: 5.0000 [IU] | PEN_INJECTOR | Freq: Three times a day (TID) | SUBCUTANEOUS | Status: AC

## 2015-12-09 NOTE — Progress Notes (Signed)
Physical Therapy Treatment Patient Details Name: Disney Goguen MRN: [id number] DOB: 1956-05-28 Today's Date: 12/09/2015    History of Present Illness 59 y.o. female with a history of nonischemic cardiomyopathy, hypertension, chronic kidney disease and previous history of GI bleed presents the ER because of weakness and dizziness; found to be in Diabetic ketoacidosis    PT Comments    Pt demo'd good use or RW and safe transfer technique and was able to complete stair negotiation to enter home. Pt progressing well towards all goals. Noted pt was delivered a regular walker however pt to benefit from youth RW due to height just below 5'. Called and got the walker switched out. Pt safe to d/c home with family once medically stable.   Follow Up Recommendations  Home health PT;Supervision/Assistance - 24 hour     Equipment Recommendations  Rolling walker with 5" wheels;3in1 (PT)    Recommendations for Other Services       Precautions / Restrictions Precautions Precautions: Fall Restrictions Weight Bearing Restrictions: No    Mobility  Bed Mobility Overal bed mobility: Modified Independent Bed Mobility: Supine to Sit     Supine to sit: Modified independent (Device/Increase time)        Transfers Overall transfer level: Modified independent Equipment used: Rolling walker (2 wheeled) Transfers: Sit to/from Stand Sit to Stand: Modified independent (Device/Increase time)         General transfer comment: v/c's for safe hand placement (not to pull up on walker)  Ambulation/Gait Ambulation/Gait assistance: Supervision Ambulation Distance (Feet): 150 Feet Assistive device: Rolling walker (2 wheeled) Gait Pattern/deviations: Step-through pattern Gait velocity: decreased, mild SOB, 1 seated rest break for 1 min   General Gait Details: pt with good walker management, no signs of instabiltiy with RW    Stairs Stairs: Yes Stairs assistance: Min guard Stair Management: One  rail Left;Sideways Number of Stairs: 2 (to mimic home)    Wheelchair Mobility    Modified Rankin (Stroke Patients Only)       Balance Overall balance assessment: Needs assistance         Standing balance support: During functional activity Standing balance-Leahy Scale: Fair Standing balance comment: pt able to stand at sink and wash hands without difficulty or instability                    Cognition Arousal/Alertness: Awake/alert Behavior During Therapy: WFL for tasks assessed/performed Overall Cognitive Status: Within Functional Limits for tasks assessed                      Exercises      General Comments General comments (skin integrity, edema, etc.): pt assisted to commode, pt able to perform pericare without difficulty      Pertinent Vitals/Pain Pain Assessment: No/denies pain    Home Living                      Prior Function            PT Goals (current goals can now be found in the care plan section) Progress towards PT goals: Progressing toward goals    Frequency  Min 2X/week    PT Plan Current plan remains appropriate    Co-evaluation             End of Session Equipment Utilized During Treatment: Gait belt Activity Tolerance: Patient tolerated treatment well Patient left: in chair;with family/visitor present     Time: 1442-1500 PT Time  Calculation (min) (ACUTE ONLY): 18 min  Charges:  $Gait Training: 8-22 mins                    G Codes:      Kingsley Callander 12/09/2015, 4:30 PM   Kittie Plater, PT, DPT Pager #: [phone] Office #: [phone]

## 2015-12-09 NOTE — Clinical Documentation Improvement (Signed)
Internal Medicine  Can the diagnosis of CKD be further specified? Please document findings in next progress note; not in BPA drop down box. Thank you!   CKD Stage I - GFR greater than or equal to 90  CKD Stage II - GFR 60-89  CKD Stage III - GFR 30-59  CKD Stage IV - GFR 15-29  CKD Stage V - GFR < 15  ESRD (End Stage Renal Disease)  Other condition  Unable to clinically determine  Supporting Information: : (risk factors, signs and symptoms, diagnostics, treatment)  White female  GFR's for the current admission running from 21 to 29  Please exercise your independent, professional judgment when responding. A specific answer is not anticipated or expected.  Thank You, Zoila Shutter RN, BSN, [place] [phone]; Cell: [phone]

## 2015-12-09 NOTE — Discharge Summary (Addendum)
Physician Discharge Summary  [name] XFG:[id number] DOB: 1956/10/22 DOA: 12/04/2015  PCP: [name], MD  Admit date: 12/04/2015 Discharge date: 12/09/2015  Time spent: > 30 minutes  Recommendations for Outpatient Follow-up:  1. Follow up with Dr. Lavera Guise in 1 week   Discharge Diagnoses:  Principal Problem:   DKA (diabetic ketoacidoses) ([place]) Active Problems:   Chronic combined systolic and diastolic HF (heart failure) (H[facility])   NICM (nonischemic cardiomyopathy) (HCC)   ARF (acute renal failure) (H[facility])   Rectal bleeding   Elevated troponin   Abdominal distention   AKI (acute kidney injury) ([place])   Pancreatitis, acute   Chronic kidney disease (CKD), stage IV (severe) ([place])  Discharge Condition: stable  Diet recommendation: diabetic  Filed Weights   12/05/15 0100 12/08/15 2012  Weight: 84.9 kg (187 lb 2.7 oz) 92.443 kg (203 lb 12.8 oz)    History of present illness:  See H&P, Labs, Consult and Test reports for all details in brief, patient is a 59 y.o. female with a history of nonischemic cardiomyopathy, hypertension, chronic kidney disease and previous history of GI bleed presents the ER because of weakness and dizziness, found to have acute pancreatitis and DKA in the setting of newly diagnosed DM.   Hospital Course:  Diabetic ketoacidosis with new onset diabetes mellitus type 2 - gap closed, transitioned to Lantus / SSI. Patient and her son provided with extensive education in the hospital by myself as well as diabetes educator. Her CBGs achieved stable control and suspect she will need further insulin adjustment at home and it appears that they have good understanding on insulin administration including sliding scale. Able to reiterate back to me how to calculate the sliding scale. She is to see Dr. Lavera Guise as an outpatient.  Acute pancreatitis - evidenced by elevated lipase and CT findings, initially NPO, her diet was slowly advanced to clears >> fulls >> regular and  tolerated that well. There was no clear etiology for her pancreatitis, she denies ETOH, no new medications. CT scan without clear evidence of cholelithiasis, RUQ [place] without gallstones, TG normal Rectal bleeding - intermittent, appears chronic per patient, she is very constipated at baseline and is associated with this. Hb stable. She had EGD and colonoscopy in 2012 by Dr. Benson Norway and Dr. Collene Mares which showed diverticulosis with internal/external hemorrhoids Abdominal distention - no ascites, suspect chronic abdominal distention Chest pain - patient had some pressure-like symptoms, troponin flat 0.07 >> 0.09 >> 0.08, likely demand from pancreatitis and DKA Hypertension - will continue present medications. Nonischemic cardiomyopathy - holding off diuretics due to #1 and renal failure. Acute on chronic renal failure, CKD III-IV - probably secondary to #1. Closely monitor intake output and metabolic panel. Advised patient to ask primary MD to refer to nephrology as an outpatient.  Hyperlipidemia on statins. Leukocytosis - probably reactionary. Closely follow. Stable Back pain - plain XR without acute abnormalities  Procedures:  None    Consultations:  None   Discharge Exam: Filed Vitals:   12/08/15 2035 12/08/15 2336 12/09/15 0400 12/09/15 0914  BP: 134/71  121/63 110/62  Pulse: 63 66 65 70  Temp: 97.8 F (36.6 C)  98.6 F (37 C) 98.4 F (36.9 C)  TempSrc: Oral  Oral Oral  Resp: _0 Height:      Weight:      SpO2: 99%  97% 99%   General: NAD Cardiovascular: RRR Respiratory: CTA biL  Discharge Instructions Activity:  As tolerated   Get Medicines reviewed  and adjusted: Please take all your medications with you for your next visit with your Primary MD  Please request your Primary MD to go over all hospital tests and procedure/radiological results at the follow up, please ask your Primary MD to get all Hospital records sent to his/her office.  If you experience worsening of  your admission symptoms, develop shortness of breath, life threatening emergency, suicidal or homicidal thoughts you must seek medical attention immediately by calling 911 or calling your MD immediately if symptoms less severe.  You must read complete instructions/literature along with all the possible adverse reactions/side effects for all the Medicines you take and that have been prescribed to you. Take any new Medicines after you have completely understood and accpet all the possible adverse reactions/side effects.   Do not drive when taking Pain medications.   Do not take more than prescribed Pain, Sleep and Anxiety Medications  Special Instructions: If you have smoked or chewed Tobacco in the last 2 yrs please stop smoking, stop any regular Alcohol and or any Recreational drug use.  Wear Seat belts while driving.  Please note You were cared for by a hospitalist during your hospital stay. Once you are discharged, your primary care physician will handle any further medical issues. Please note that NO REFILLS for any discharge medications will be authorized once you are discharged, as it is imperative that you return to your primary care physician (or establish a relationship with a primary care physician if you do not have one) for your aftercare needs so that they can reassess your need for medications and monitor your lab values.      Discharge Instructions    Ambulatory referral to Nutrition and Diabetic Education    Complete by:  As directed   Hx pancreatitis. New onset type 2 on basa/bolus insulin pen            Medication List    TAKE these medications        acetaminophen 500 MG tablet  Commonly known as:  TYLENOL  Take 1,000 mg by mouth every 6 (six) hours as needed for mild pain, moderate pain or headache. For pain     amLODipine 10 MG tablet  Commonly known as:  NORVASC  Take 1 tablet (10 mg total) by mouth daily.     aspirin 81 MG EC tablet  Take 1 tablet (81 mg  total) by mouth daily.     atorvastatin 40 MG tablet  Commonly known as:  LIPITOR  Take 1 tablet (40 mg total) by mouth daily at 6 PM.     blood glucose meter kit and supplies Kit  Dispense based on patient and insurance preference. Use up to four times daily as directed. (FOR ICD-9 250.00, 250.01).     cloNIDine 0.2 MG tablet  Commonly known as:  CATAPRES  TAKE 1 TABLET (0.2 MG TOTAL) BY MOUTH 2 (TWO) TIMES DAILY.     furosemide 20 MG tablet  Commonly known as:  LASIX  TAKE 1 TABLET BY MOUTH EVERY DAY     insulin aspart 100 UNIT/ML FlexPen  Commonly known as:  NOVOLOG FLEXPEN  Inject 5 Units into the skin 3 (three) times daily with meals. Additional insulin (5 units PLUS) CBG 70 - 120: 0 units CBG 121 - 150: 1 unit CBG 151 - 200: 2 units CBG 201 - 250: 3 units CBG 251 - 350: 4 units CBG 351 - 400: 6 units     Insulin Glargine 100  UNIT/ML Solostar Pen  Commonly known as:  LANTUS SOLOSTAR  Inject 15 Units into the skin daily at 10 pm.     isosorbide mononitrate 120 MG 24 hr tablet  Commonly known as:  IMDUR  Take 1 tablet (120 mg total) by mouth daily.     metoprolol 50 MG tablet  Commonly known as:  LOPRESSOR  Take 1 tablet (50 mg total) by mouth 2 (two) times daily.     terazosin 1 MG capsule  Commonly known as:  HYTRIN  Take 1 capsule (1 mg total) by mouth at bedtime.       Follow-up Information    Follow up with MASOUD,JAVED, MD. Schedule an appointment as soon as possible for a visit in 1 week.   Specialty:  Internal Medicine   Why:  also please ask for a referral to Nephrology; APPOINTMENT: Message Left @ office to call [facility]. back to setup appointment   Contact information:   [place] [address] [phone]       The results of significant diagnostics from this hospitalization (including imaging, microbiology, ancillary and laboratory) are listed below for reference.    Significant Diagnostic Studies: Ct Abdomen Pelvis Wo  Contrast  12/05/2015  CLINICAL DATA:  Abdominal distension with pain EXAM: CT ABDOMEN AND PELVIS WITHOUT CONTRAST TECHNIQUE: Multidetector CT imaging of the abdomen and pelvis was performed following the standard protocol without IV contrast. COMPARISON:  None. FINDINGS: Lung bases are free of acute infiltrate or sizable effusion. The liver is diffusely decreased in attenuation consistent with fatty infiltration. Some central areas of focal fatty sparing are noted. The spleen, adrenal glands and kidneys are within normal limits. Pancreas is well visualized and demonstrates some peripancreatic inflammatory changes with associated edema of the third portion of the duodenum. The majority of the body and tail of the pancreas is fatty infiltrated. Scattered diverticular changes noted. The bladder is partially distended. The uterus is within normal limits. No significant lymphadenopathy is noted. The appendix is within normal limits. Laxity of the anterior abdominal wall is noted. A small fat containing umbilical hernia is seen. The bony structures show degenerative change of the lumbar spine. IMPRESSION: Fatty liver. Changes of pancreatitis with focal irritation of the adjacent third portion of the duodenum. This corresponds with the patient's elevated lipase. Diverticulosis without diverticulitis. Electronically Signed   By: Inez Catalina M.D.   On: 12/05/2015 17:17   Dg Lumbar Spine 2-3 Views  12/07/2015  CLINICAL DATA:  Low back pain especially on RIGHT side but starting to radiate to LEFT side and stomach, abdominal distension EXAM: LUMBAR SPINE - 2-3 VIEW COMPARISON:  None FINDINGS: Retained contrast in colon is superimposed with the spine on AP view. 4 non-rib-bearing segments. Bones demineralized. Minimal degenerative disc disease changes with scattered disc space narrowing and tiny endplate spurs. Vertebral body heights maintained without fracture or subluxation. IMPRESSION: Osseous demineralization with  mild degenerative disc disease changes. No definite acute abnormalities. Electronically Signed   By: Lavonia Dana M.D.   On: 12/07/2015 16:48   Dg Chest Portable 1 View  12/04/2015  CLINICAL DATA:  Cough and congestion, history CHF EXAM: PORTABLE CHEST 1 VIEW COMPARISON:  07/07/2014 FINDINGS: Cardiomegaly evident without current edema, CHF, focal pneumonia, collapse or consolidation. No significant effusion or pneumothorax. Tortuous elongated thoracic aorta. Slight rotation to the left. Degenerative changes of the spine with an associated scoliosis. IMPRESSION: Cardiomegaly without CHF or pneumonia.  Chronic changes as above. Electronically Signed   By: Jerilynn Mages.  Shick M.D.   On: 12/04/2015 21:05   Dg Abd Portable 2v  12/05/2015  CLINICAL DATA:  Abdominal distention and pain EXAM: PORTABLE ABDOMEN - 2 VIEW COMPARISON:  10/01/2012 FINDINGS: The bowel gas pattern is normal. There is no evidence of free air. No radio-opaque calculi or other significant radiographic abnormality is seen. IMPRESSION: Negative. Electronically Signed   By: Kerby Moors M.D.   On: 12/05/2015 14:07   Dg Foot 2 Views Left  12/06/2015  CLINICAL DATA:  59 year old female with left foot pain. EXAM: LEFT FOOT - 2 VIEW COMPARISON:  None. FINDINGS: Severe joint space narrowing and osteophytosis noted at the first MTP joint. There are some erosive type changes noted - likely erosive osteoarthritis but CT is not entirely excluded. There is no evidence of acute fracture or dislocation. Mild degenerative changes in the midfoot are noted as well as a small to moderate calcaneal spur. Diffuse soft tissue swelling is noted. IMPRESSION: Soft tissue swelling without acute bony abnormality. Severe joint space narrowing and erosive changes at the first MTP joint -question erosive osteoarthritis or possibly gout. Electronically Signed   By: Margarette Canada M.D.   On: 12/06/2015 10:00   Dg Foot 2 Views Right  12/06/2015  CLINICAL DATA:  Increased foot  deformity EXAM: RIGHT FOOT - 2 VIEW COMPARISON:  None. FINDINGS: Diffuse soft tissue swelling is noted. No acute fracture or dislocation is seen. Degenerative changes in the tarsal bones as well as the first MTP joint are noted. IMPRESSION: Degenerative change without acute abnormality. Electronically Signed   By: Inez Catalina M.D.   On: 12/06/2015 09:57   US Abdomen Limited Ruq  12/06/2015  CLINICAL DATA:  59 year old female with a history of pancreatitis EXAM: US ABDOMEN LIMITED - RIGHT UPPER QUADRANT COMPARISON:  CT 12/05/2015 FINDINGS: Gallbladder: No gallstones or wall thickening visualized. No sonographic Murphy sign noted. Common bile duct: Diameter: 5 mm Liver: Diffusely heterogeneous echotexture of liver, compatible with findings on recent CT of fatty liver. IMPRESSION: Sonographic survey demonstrates changes of steatosis, present on prior CT. Signed, Dulcy Fanny. Earleen Newport, DO [facility] Electronically Signed   By: Corrie Mckusick D.O.   On: 12/06/2015 18:54    Microbiology: Recent Results (from the past 240 hour(s))  MRSA PCR Screening     Status: None   Collection Time: 12/05/15  2:31 AM  Result Value Ref Range Status   MRSA by PCR NEGATIVE NEGATIVE Final    Comment:        The GeneXpert MRSA Assay (FDA approved for NASAL specimens only), is one component of a comprehensive MRSA colonization surveillance program. It is not intended to diagnose MRSA infection nor to guide or monitor treatment for MRSA infections.      Labs: Basic Metabolic Panel:  Recent Labs Lab 12/04/15 1945 12/05/15 0122 12/05/15 1015 12/05/15 1231 12/06/15 0607 12/07/15 0449 12/08/15 0512  NA 133* 140 144 143 137 139 140  K 4.5 3.0* 3.8 3.9 4.5 4.0 4.5  CL 95* 106 109 111 109 113* 114*  CO2 18* 19* 21* 22 19* 19* 16*  GLUCOSE 1172* 737* 196* 112* 213* 131* 131*  BUN 71* 66* 66* 66* 64* 42* 34*  CREATININE 2.58* 2.32* 2.31* 2.38* 2.37* 1.93*  1.82*  CALCIUM 9.7 9.6 9.5 9.1 8.3* 8.5* 8.2*   Liver Function Tests:  Recent Labs Lab 12/04/15 1945 12/06/15 0607  AST 22 33  ALT 28 24  ALKPHOS 244* 134*  BILITOT 1.0 0.7  PROT 8.0 5.9*  ALBUMIN 3.7 2.6*    Recent Labs Lab 12/04/15 1945 12/06/15 0607  LIPASE 1895* 354*   CBC:  Recent Labs Lab 12/05/15 0122 12/05/15 1015 12/06/15 0607 12/07/15 0449 12/08/15 0512  WBC 13.1* 17.4* 12.8* 12.2* 11.9*  HGB 15.1* 14.0 12.5 12.3 11.3*  HCT 44.6 42.7 39.7 37.3 35.5*  MCV 77.6* 78.9 80.4 79.4 80.9  PLT 296 317 229 194 176   Cardiac Enzymes:  Recent Labs Lab 12/04/15 2044 12/05/15 0122 12/05/15 0723 12/05/15 1231  TROPONINI 0.08* 0.07* 0.09* 0.08*   CBG:  Recent Labs Lab 12/08/15 2009 12/08/15 2315 12/09/15 0351 12/09/15 0846 12/09/15 1104  GLUCAP 251* 228* 267* 276* 276*    Signed:  GHERGHE, COSTIN  [facility] 12/09/2015, 3:01 PM

## 2015-12-09 NOTE — Progress Notes (Signed)
Inpatient Diabetes Program Recommendations  AACE/ADA: New Consensus Statement on Inpatient Glycemic Control (2015)  Target Ranges:  Prepandial:   less than 140 mg/dL      Peak postprandial:   less than 180 mg/dL (1-2 hours)      Critically ill patients:  140 - 180 mg/dL   Spoke at length with patient and son of patient regarding role of insulin. Demonstrated how to use an insulin pen with teach-back. Patient has follow-up appt with primary MD tomorrow, Dec 21st. Patient demonstrated back times 2 how to use the pen. RN to teach how to draw up and administer insulin as well. Pen starter kit not yet arrived nor has the living well with dm ed book. Started patient on the videos, and RN's to teach how to check blood sugars fasting, before lunch and supper and HS. Gave patient office information if assistance further needed.  Thank you Rosita Kea, RN, MSN, CDE  Diabetes Inpatient Program Office: [phone] Pager: [phone] 8:00 am to 5:00 pm

## 2015-12-09 NOTE — Care Management Note (Signed)
Case Management Note  Patient Details  Name: [name] MRN: [id number] Date of Birth: 07/01/1956  Subjective/Objective:      CM following for progression and d/c planning.              Action/Plan: 12/09/2015 Met with pt and son , they have selected [facility] for HHRN and HHPT services. [facility] notified and are able to provide services to [place] area.   Expected Discharge Date:   12/09/2015               Expected Discharge Plan:  Home w Home Health Services  In-House Referral:  NA  Discharge planning Services  CM Consult  Post Acute Care Choice:  Durable Medical Equipment Choice offered to:  Patient  DME Arranged:  Walker rolling DME Agency:  [facility]  HH Arranged:  RN, PT HH Agency:  [facility]  Status of Service:  Completed, signed off  Medicare Important Message Given:    Date Medicare IM Given:    Medicare IM give by:    Date Additional Medicare IM Given:    Additional Medicare Important Message give by:     If discussed at Long Length of Stay Meetings, dates discussed:    Additional Comments:  Royal, Cheryl U, RN 12/09/2015, 11:08 AM  

## 2015-12-09 NOTE — Discharge Instructions (Signed)
Follow with MASOUD,JAVED, MD in 5-7 days  Accuchecks 4 times/day, Once in AM empty stomach and then before each meal. Log in all results and show them to your primary doctor in 3-5 days. If any glucose reading is under 80 or above 300 call your primary doctor immediately.   Please get a complete blood count and chemistry panel checked by your Primary MD at your next visit, and again as instructed by your Primary MD. Please get your medications reviewed and adjusted by your Primary MD.  Please request your Primary MD to go over all Hospital Tests and Procedure/Radiological results at the follow up, please get all Hospital records sent to your Prim MD by signing hospital release before you go home.  If you had Pneumonia of Lung problems at the Hospital: Please get a 2 view Chest X ray done in 6-8 weeks after hospital discharge or sooner if instructed by your Primary MD.  If you have Congestive Heart Failure: Please call your Cardiologist or Primary MD anytime you have any of the following symptoms:  1) 3 pound weight gain in 24 hours or 5 pounds in 1 week  2) shortness of breath, with or without a dry hacking cough  3) swelling in the hands, feet or stomach  4) if you have to sleep on extra pillows at night in order to breathe  Follow cardiac low salt diet and 1.5 lit/day fluid restriction.  If you have diabetes Accuchecks 4 times/day, Once in AM empty stomach and then before each meal. Log in all results and show them to your primary doctor at your next visit. If any glucose reading is under 80 or above 300 call your primary MD immediately.  If you have Seizure/Convulsions/Epilepsy: Please do not drive, operate heavy machinery, participate in activities at heights or participate in high speed sports until you have seen by Primary MD or a Neurologist and advised to do so again.  If you had Gastrointestinal Bleeding: Please ask your Primary MD to check a complete blood count within one  week of discharge or at your next visit. Your endoscopic/colonoscopic biopsies that are pending at the time of discharge, will also need to followed by your Primary MD.  Get Medicines reviewed and adjusted. Please take all your medications with you for your next visit with your Primary MD  Please request your Primary MD to go over all hospital tests and procedure/radiological results at the follow up, please ask your Primary MD to get all Hospital records sent to his/her office.  If you experience worsening of your admission symptoms, develop shortness of breath, life threatening emergency, suicidal or homicidal thoughts you must seek medical attention immediately by calling 911 or calling your MD immediately  if symptoms less severe.  You must read complete instructions/literature along with all the possible adverse reactions/side effects for all the Medicines you take and that have been prescribed to you. Take any new Medicines after you have completely understood and accpet all the possible adverse reactions/side effects.   Do not drive or operate heavy machinery when taking Pain medications.   Do not take more than prescribed Pain, Sleep and Anxiety Medications  Special Instructions: If you have smoked or chewed Tobacco  in the last 2 yrs please stop smoking, stop any regular Alcohol  and or any Recreational drug use.  Wear Seat belts while driving.  Please note You were cared for by a hospitalist during your hospital stay. If you have any questions about your  discharge medications or the care you received while you were in the hospital after you are discharged, you can call the unit and asked to speak with the hospitalist on call if the hospitalist that took care of you is not available. Once you are discharged, your primary care physician will handle any further medical issues. Please note that NO REFILLS for any discharge medications will be authorized once you are discharged, as it is  imperative that you return to your primary care physician (or establish a relationship with a primary care physician if you do not have one) for your aftercare needs so that they can reassess your need for medications and monitor your lab values.  You can reach the hospitalist office at phone [phone] or fax [phone]   If you do not have a primary care physician, you can call [phone] for a physician referral.  Activity: As tolerated with Full fall precautions use walker/cane & assistance as needed  Diet: diabetic  Disposition Home

## 2015-12-09 NOTE — Progress Notes (Signed)
Pt successfully tested her capillary blood glucose. Pt has drawn the appropriate amount of insulin and self administered the correct dose. Pt given 2 starter kits (one for syringes and one for the flex pen). Pt also given the Living Well with Diabetes book prior to discharge. Questions answered appropriately and understanding verbalized.    Discharge paperwork discussed with pt and pt's son; both verbalized understanding. IV removed without issue. Prescriptions given to pt. Pt to leave unit via wheelchair accompanied by Tech.

## 2015-12-09 NOTE — Progress Notes (Signed)
Pt correctly and successfully administered 10 units of Novolog insulin to herself in her abdomen. Will continue teachings throughout the day.

## 2015-12-19 ENCOUNTER — Telehealth: Payer: Self-pay | Admitting: Physician Assistant

## 2015-12-19 NOTE — Telephone Encounter (Signed)
Routing to Triage for further evaluation; pt message pt c/o chest pain and irregular Heart rate

## 2015-12-19 NOTE — Telephone Encounter (Signed)
Pt was instructed to call Richardson Dopp and Dr. Percival Spanish to inform them that her home health nurse came out to see her today and noted on her pulse ox that her rate got as low as 45 bpm for an extended amount of time.  Pt states she is completely asymptomatic with no chest pain, sob, dizziness, pre-syncopal, or syncopal episodes at this time or at anytime. Pt states that the home health nurse contacted her PCP Dr. Lavera Guise, and per Dr. Lavera Guise, he wanted the pt to cut her metoprolol in 1/2 and have a 48 hour holter monitor placed on her next week sometime.  Per the pt she was instructed to call her cardiologist to inform him of this plan.  Pt states she has a significant heart history, and was recently diagnosed with diabetes, which is why home health care is seeing her in the home.  Spoke with Trinidad Curet CMA and the pt can be scheduled to come in and see Scott on next Wednesday 12/24/15 at 12:10 pm for complaints.  Informed the pt of this appt date and time and she agreed to this.  Advised the pt to follow her PCP's recommendations to cut her metoprolol in 1/2 for bradycardia.  Advised the pt to stay plenty hydrated throughout the weekend.  Advised the pt that if she becomes symptomatic with chest pain, sob, palpitations, dizziness, pre-syncopal, or syncopal episodes, to refer to [facility] ER immediately, or call 911.  Pt verbalized understanding and agrees with this plan.  Will route this message to Richardson Dopp PA-C and Dr. Percival Spanish as an Juluis Rainier.

## 2015-12-19 NOTE — Telephone Encounter (Signed)
Pt called to talk to scott re some heart test that have been ordered for her because of her home health nurse stating she was having chest pain and abnormal heart rate pls call [phone]

## 2015-12-22 NOTE — Telephone Encounter (Signed)
[address] [place], [place]   12/22/2015 6:46 AM

## 2015-12-23 NOTE — Progress Notes (Signed)
Cardiology Office Note    Date:  12/24/2015   ID:  [name], DOB December 17, 1956, MRN [id number]  PCP:  [name], MD  Cardiologist:  Dr. Minus Breeding   Electrophysiologist:  n/a  Chief Complaint  Patient presents with  . Bradycardia    History of Present Illness:  [name] is a 60 y.o. female with a a long hx of uncontrolled HTN (poor adherence due to cost and adverse effects), tobacco abuse. She was admitted 06/2014 with acute combined systolic and diastolic CHF and a/c renal failure (peak Cr 2.15) in the setting of hypertensive urgency with associated elevated in troponin (peak 0.49). Echo demonstrated reduced LV function with EF 20-25%. ACEI was not started due to CKD. Patient noted hx of true allergy to Hydralazine. She was placed on Coreg and Isosorbide. Coreg was switched to Metoprolol. She underwent noninvasive testing with Lexiscan Myoview instead of L heart cath due to CKD. Myoview demonstrated no scar or ischemia. Elevated troponin was felt to be from demand ischemia. Etiology of cardiomyopathy was felt to be non-ischemic and probably related to uncontrolled HTN.   Renal artery [place] 8/15 was neg for RAS. Follow-up echo in 2/16 demonstrated improved LV function with an EF of 50-55% and moderate diastolic dysfunction.  She was admitted in 12/16 with DKA and pancreatitis in the setting of newly dx DM2.    Since discharge, she has had a home health nurse. Her heart rate was recently recorded in the 40s on pulse oximetry. Her metoprolol was cut in half and she was placed on a Holter monitor. The monitor is due to be taken off today. She denies dizziness, weakness, near-syncope or syncope. She denies chest discomfort. She denies significant dyspnea. She sleeps on 2 pillows chronically. She denies PND. She has noted increasing edema and increased abdominal bloating. She tells me her Lasix was recently stopped to avoid dehydration in the setting of uncontrolled  diabetes.   Past Medical History  Diagnosis Date  . Diverticular disease   . Hypertensive heart disease     a. Renal Artery Duplex (8/15):  no RAS  . Chronic combined systolic and diastolic CHF (congestive heart failure) ([place])     a. Echo (06/2014): Severe LVH, EF 20-25%, no RWMA, Gr 3 DD, trivial MR, mild LAE, mild RVE, PASP 38 mmHg .>> b. Echo 2/16 EF 50-55%  . NICM (nonischemic cardiomyopathy) ([place])     a. Nuclear (06/2014): EF 32%, apical thinning, no definite scar or ischemia;  b. Echo (2/16):  Mild LVH, EF 50-55%, Gr 2 DD, no RWMA  . CKD (chronic kidney disease)   . PUD (peptic ulcer disease)   . Hypertension   . UTI (lower urinary tract infection)     Past Surgical History  Procedure Laterality Date  . None    . Dilation and curettage of uterus      Current Outpatient Prescriptions  Medication Sig Dispense Refill  . acetaminophen (TYLENOL) 500 MG tablet Take 1,000 mg by mouth every 6 (six) hours as needed for mild pain, moderate pain or headache. For pain    . amLODipine (NORVASC) 10 MG tablet Take 1 tablet (10 mg total) by mouth daily. 90 tablet 3  . aspirin EC 81 MG EC tablet Take 1 tablet (81 mg total) by mouth daily.    Marland Kitchen atorvastatin (LIPITOR) 40 MG tablet Take 1 tablet (40 mg total) by mouth daily at 6 PM. 90 tablet 3  . B-D ULTRAFINE III SHORT PEN 31G X 8  MM MISC USE AS DIRECTED *EMERGENCY FILL*  0  . blood glucose meter kit and supplies KIT Dispense based on patient and insurance preference. Use up to four times daily as directed. (FOR ICD-9 250.00, 250.01). 1 each 0  . cloNIDine (CATAPRES) 0.2 MG tablet TAKE 1 TABLET (0.2 MG TOTAL) BY MOUTH 2 (TWO) TIMES DAILY. 180 tablet 2  . furosemide (LASIX) 20 MG tablet Take 1 tablet (20 mg total) by mouth every other day. 90 tablet 3  . insulin aspart (NOVOLOG FLEXPEN) 100 UNIT/ML FlexPen Inject 5 Units into the skin 3 (three) times daily with meals. Additional insulin (5 units PLUS) CBG 70 - 120: 0 units CBG 121 - 150: 1  unit CBG 151 - 200: 2 units CBG 201 - 250: 3 units CBG 251 - 350: 4 units CBG 351 - 400: 6 units 15 mL 11  . Insulin Glargine (LANTUS SOLOSTAR) 100 UNIT/ML Solostar Pen Inject 15 Units into the skin daily at 10 pm. 15 mL 11  . isosorbide mononitrate (IMDUR) 120 MG 24 hr tablet Take 1 tablet (120 mg total) by mouth daily. 30 tablet 12  . ONE TOUCH ULTRA TEST test strip 1 each by Other route 4 (four) times daily.   1  . ONETOUCH DELICA LANCETS 16X MISC Inject 33 g into the skin 4 (four) times daily.   1  . terazosin (HYTRIN) 1 MG capsule Take 1 capsule (1 mg total) by mouth at bedtime. 90 capsule 3  . metoprolol tartrate (LOPRESSOR) 25 MG tablet Take 1 tablet (25 mg total) by mouth 2 (two) times daily. 180 tablet 3   No current facility-administered medications for this visit.    Allergies:   Hydralazine and Other   Social History   Social History  . Marital Status: Married    Spouse Name: N/A  . Number of Children: 2  . Years of Education: 7   Social History Main Topics  . Smoking status: Former Smoker    Quit date: 07/05/2014  . Smokeless tobacco: Never Used  . Alcohol Use: No  . Drug Use: No  . Sexual Activity: Not Asked   Other Topics Concern  . None   Social History Narrative   Currently lives in a house with her husband.    Fun: Watch TV.   Denies religious beliefs effecting health care.      Family History:  The patient's family history includes Heart attack in her paternal grandfather; Hypertension in her paternal grandmother; Kidney disease in her mother; Stroke in her paternal grandmother.   ROS:   Please see the history of present illness.    Review of Systems  Cardiovascular: Positive for leg swelling.  Gastrointestinal: Positive for constipation.  All other systems reviewed and are negative.   PHYSICAL EXAM:   VS:  BP 144/82 mmHg  Pulse 58  Ht 5' (1.524 m)  Wt 192 lb 1.9 oz (87.145 kg)  BMI 37.52 kg/m2  SpO2 96%   GEN: Well nourished, well  developed, in no acute distress HEENT: normal Neck: no JVD, no masses Cardiac: Normal S1/S2, RRR; no murmurs, rubs, or gallops, trace-1+ bilateral LE edema;     Respiratory:  clear to auscultation bilaterally; no wheezing, rhonchi or rales GI: distended MS: no deformity or atrophy Skin: warm and dry, no rash Neuro:  Bilateral strength equal, no focal deficits  Psych: Alert and oriented x 3, normal affect  Wt Readings from Last 3 Encounters:  12/24/15 192 lb 1.9 oz (87.145 kg)  12/08/15 203 lb 12.8 oz (92.443 kg)  07/18/15 194 lb 12.8 oz (88.361 kg)      Studies/Labs Reviewed:   EKG:  EKG is  ordered today.  The ekg ordered today demonstrates sinus brady, HR 45, normal axis, PRWP, NSSTTW changes, QTc 435 ms, no significant changes.   Recent Labs: 12/06/2015: ALT 24 12/08/2015: BUN 34*; Creatinine, Ser 1.82*; Hemoglobin 11.3*; Platelets 176; Potassium 4.5; Sodium 140   Recent Lipid Panel    Component Value Date/Time   CHOL 81 12/07/2015 0449   TRIG 114 12/07/2015 0449   HDL 25* 12/07/2015 0449   CHOLHDL 3.2 12/07/2015 0449   VLDL 23 12/07/2015 0449   LDLCALC 33 12/07/2015 0449    Additional studies/ records that were reviewed today include:   Echo (06/2014):  Severe LVH, EF 20-25%, no RWMA, Gr 3 DD, trivial MR, mild LAE, mild RVE, PASP 38 mmHg.  Echo (01/27/15):  Mild LVH, EF 50-55%, normal wall motion, grade 2 diastolic dysfunction  Nuclear (06/2014):  EF 32%, apical thinning, no definite scar or ischemia    ASSESSMENT:    1. Hypertensive heart disease with heart failure ([place])   2. Chronic diastolic CHF (congestive heart failure) ([place])   3. Bradycardia   4. NICM (nonischemic cardiomyopathy) ([place])   5. CKD (chronic kidney disease), stage 3 (moderate)   6. Hyperlipidemia   7. Type 2 diabetes mellitus with stage 3 chronic kidney disease, with long-term current use of insulin ([facility])     PLAN:  In order of problems listed above:  1.Hypertensive heart disease  with heart failure: Her BP is somewhat elevated today. However, it remains better than it was previously. Elevated blood pressure is likely related to discontinuation of Lasix as well as reduction in her metoprolol dose. I will resume her Lasix at 20 mg every other day with close follow-up on renal function and potassium. Continue Amlodipine, clonidine, isosorbide, metoprolol, Hytrin.  2.Chronic Diastolic CHF: She has been off of her Lasix for several weeks. She now notes increasing abdominal girth and LE edema. I have asked her to resume her Lasix at 20 mg every other day. Repeat BMET in 1-2 weeks.  3. Bradycardia - I reviewed her chart. Her heart rate has been in the mid to upper 40s to low 50s since we have been seeing her here. She is not symptomatic. We will await the results of her Holter. At this point, I would not reduce the dose of her beta blocker any further unless she has significant pauses or her heart rate goes below 40.  4. NICM (nonischemic cardiomyopathy): Likely related to HTN. Continue beta blocker, nitrates. She is not on ACEI/ARB or spironolactone 2/2 CKD. She has a true allergy to Hydralazine. Echo in 2/16 demonstrated improved LV function.   5. CKD (chronic kidney disease): Recent creatinines stable. She will have a repeat BMET in 1-2 weeks with the resumption of her Lasix.  6. Hyperlipidemia: Recent LDL optimal. Continue Lipitor.   7. Diabetes - Managed by PCP.     Medication Adjustments/Labs and Tests Ordered: Current medicines are reviewed at length with the patient today.  Concerns regarding medicines are outlined above.  Medication changes, Labs and Tests ordered today are listed below. Patient Instructions  Medication Instructions:  1. RESTART LASIX 20 MG EVERY OTHER DAY  2. CONTINUE ON METOPROLOL TARTRATE 25 MG TWICE DAILY  Labwork: BMET TO BE DONE IN 7-10 DAYS  Testing/Procedures: NONE  Follow-Up: Your physician wants you to follow-up in: 13  MONTHS WITH [place], PAC. You will receive a reminder letter in the mail two months in advance. If you don't receive a letter, please call our office to schedule the follow-up appointment.   Any Other Special Instructions Will Be Listed Below (If Applicable).   If you need a refill on your cardiac medications before your next appointment, please call your pharmacy.        Signed, Richardson Dopp, PA-C  12/24/2015 1:23 PM    [facility] [address], [place], [place]  [address] Phone: [phone]; Fax: [phone]

## 2015-12-24 ENCOUNTER — Encounter: Payer: Self-pay | Admitting: Physician Assistant

## 2015-12-24 ENCOUNTER — Ambulatory Visit (INDEPENDENT_AMBULATORY_CARE_PROVIDER_SITE_OTHER): Payer: BLUE CROSS/BLUE SHIELD | Admitting: Physician Assistant

## 2015-12-24 VITALS — BP 144/82 | HR 58 | Ht 60.0 in | Wt 192.1 lb

## 2015-12-24 DIAGNOSIS — Z794 Long term (current) use of insulin: Secondary | ICD-10-CM

## 2015-12-24 DIAGNOSIS — I110 Hypertensive heart disease with heart failure: Secondary | ICD-10-CM

## 2015-12-24 DIAGNOSIS — N183 Chronic kidney disease, stage 3 unspecified: Secondary | ICD-10-CM

## 2015-12-24 DIAGNOSIS — I429 Cardiomyopathy, unspecified: Secondary | ICD-10-CM

## 2015-12-24 DIAGNOSIS — I5032 Chronic diastolic (congestive) heart failure: Secondary | ICD-10-CM | POA: Diagnosis not present

## 2015-12-24 DIAGNOSIS — R001 Bradycardia, unspecified: Secondary | ICD-10-CM

## 2015-12-24 DIAGNOSIS — E785 Hyperlipidemia, unspecified: Secondary | ICD-10-CM | POA: Diagnosis not present

## 2015-12-24 DIAGNOSIS — I10 Essential (primary) hypertension: Secondary | ICD-10-CM | POA: Diagnosis not present

## 2015-12-24 DIAGNOSIS — E1122 Type 2 diabetes mellitus with diabetic chronic kidney disease: Secondary | ICD-10-CM

## 2015-12-24 DIAGNOSIS — I428 Other cardiomyopathies: Secondary | ICD-10-CM

## 2015-12-24 MED ORDER — FUROSEMIDE 20 MG PO TABS: 20.0000 mg | ORAL_TABLET | ORAL | Status: DC

## 2015-12-24 MED ORDER — METOPROLOL TARTRATE 25 MG PO TABS: 25.0000 mg | ORAL_TABLET | Freq: Two times a day (BID) | ORAL | Status: DC

## 2015-12-24 MED ORDER — AMLODIPINE BESYLATE 10 MG PO TABS: 10.0000 mg | ORAL_TABLET | Freq: Every day | ORAL | Status: DC

## 2015-12-24 MED ORDER — ATORVASTATIN CALCIUM 40 MG PO TABS: 40.0000 mg | ORAL_TABLET | Freq: Every day | ORAL | Status: DC

## 2015-12-24 MED ORDER — TERAZOSIN HCL 1 MG PO CAPS: 1.0000 mg | ORAL_CAPSULE | Freq: Every day | ORAL | Status: DC

## 2015-12-24 NOTE — Patient Instructions (Signed)
Medication Instructions:  1. RESTART LASIX 20 MG EVERY OTHER DAY  2. CONTINUE ON METOPROLOL TARTRATE 25 MG TWICE DAILY  Labwork: BMET TO BE DONE IN 7-10 DAYS  Testing/Procedures: NONE  Follow-Up: Your physician wants you to follow-up in: [place], PAC. You will receive a reminder letter in the mail two months in advance. If you don't receive a letter, please call our office to schedule the follow-up appointment.   Any Other Special Instructions Will Be Listed Below (If Applicable).   If you need a refill on your cardiac medications before your next appointment, please call your pharmacy.

## 2016-01-06 ENCOUNTER — Other Ambulatory Visit (INDEPENDENT_AMBULATORY_CARE_PROVIDER_SITE_OTHER): Payer: BLUE CROSS/BLUE SHIELD | Admitting: *Deleted

## 2016-01-06 ENCOUNTER — Encounter: Payer: BLUE CROSS/BLUE SHIELD | Attending: Internal Medicine | Admitting: Dietician

## 2016-01-06 ENCOUNTER — Encounter: Payer: Self-pay | Admitting: Dietician

## 2016-01-06 VITALS — Ht <= 58 in | Wt 188.0 lb

## 2016-01-06 DIAGNOSIS — I110 Hypertensive heart disease with heart failure: Secondary | ICD-10-CM | POA: Diagnosis not present

## 2016-01-06 DIAGNOSIS — E1165 Type 2 diabetes mellitus with hyperglycemia: Secondary | ICD-10-CM | POA: Diagnosis not present

## 2016-01-06 DIAGNOSIS — E669 Obesity, unspecified: Secondary | ICD-10-CM | POA: Insufficient documentation

## 2016-01-06 DIAGNOSIS — E118 Type 2 diabetes mellitus with unspecified complications: Secondary | ICD-10-CM

## 2016-01-06 DIAGNOSIS — Z713 Dietary counseling and surveillance: Secondary | ICD-10-CM | POA: Insufficient documentation

## 2016-01-06 DIAGNOSIS — IMO0002 Reserved for concepts with insufficient information to code with codable children: Secondary | ICD-10-CM

## 2016-01-06 LAB — BASIC METABOLIC PANEL
BUN: 25 mg/dL (ref 7–25)
CO2: 23 mmol/L (ref 20–31)
Calcium: 8.8 mg/dL (ref 8.6–10.4)
Chloride: 104 mmol/L (ref 98–110)
Creat: 1.55 mg/dL — ABNORMAL HIGH (ref 0.50–0.99)
Glucose, Bld: 102 mg/dL — ABNORMAL HIGH (ref 65–99)
Potassium: 4.1 mmol/L (ref 3.5–5.3)
Sodium: 139 mmol/L (ref 135–146)

## 2016-01-06 NOTE — Progress Notes (Signed)
Diabetes Self-Management Education  Visit Type: First/Initial  Appt. Start Time: 0815 Appt. End Time: 0945  01/06/2016  [name], identified by name and date of birth, is a 60 y.o. female with a diagnosis of Diabetes: Type 2.   Patient is here with her son.  Hx includes newly diagnosed type 2 diabetes in December resulting in hospitalization due to DKA and pancreatitis.  Other hx includes HTN, Stage IV CKD (GFR 29), NICM, CHF, diverticulitis.  Patient does the shopping and the cooking.  She reports following a low sodium and low sugar diet with smaller portion sizes.  ASSESSMENT  Height 5" (0.127 m), weight 188 lb (85.276 kg). Body mass index is 5,287.12 kg/(m^2).      Diabetes Self-Management Education - 01/06/16 [id number]    Visit Information   Visit Type First/Initial   Initial Visit   Diabetes Type Type 2   Are you currently following a meal plan? Yes   What type of meal plan do you follow? avoid sugar and small portions   Are you taking your medications as prescribed? Yes   Date Diagnosed 11/2015   Health Coping   How would you rate your overall health? Very Poor   Psychosocial Assessment   Patient Belief/Attitude about Diabetes Other (comment)  shocked/overwhelmed   Self-care barriers None   Self-management support Doctor's office;Family   Other persons present Patient;Family Member  son   Patient Concerns Nutrition/Meal planning;Glycemic Control   Special Needs None   Preferred Learning Style No preference indicated   Learning Readiness Ready   How often do you need to have someone help you when you read instructions, pamphlets, or other written materials from your doctor or pharmacy? 1 - Never   What is the last grade level you completed in school? 9th grade   Complications   Last HgB A1C per patient/outside source 14 %  12/05/15   How often do you check your blood sugar? > 4 times/day   Fasting Blood glucose range (mg/dL) 130-179   Number of hypoglycemic  episodes per month 0   Number of hyperglycemic episodes per week 14   Can you tell when your blood sugar is high? No   Have you had a dilated eye exam in the past 12 months? No   Have you had a dental exam in the past 12 months? No   Are you checking your feet? Yes   How many days per week are you checking your feet? 7   Dietary Intake   Breakfast Scrambled egg, 1 slice dry [employer] toast, 1/2 cup coffee with sugar free creamer OR chex cereal with 2% milk  7:30-8:30   Lunch applesauce, [place] sandwich on rye bread, lite mayo  12:30-1   Snack (afternoon) Graham crackers and diet soda or crystal lite, and occasional fruit   Dinner baked chicken and salad OR spaghetti with meat sauce OR [place] sandwich AND fruit  6-7:30   Snack (evening) SF jello or graham cracker and diet drink   Beverage(s) water, crystal lite, unsweetened tea, diet sprite or diet coke, diet cranberry juice,    Exercise   Exercise Type ADL's   How many days per week to you exercise? 0   How many minutes per day do you exercise? 0   Total minutes per week of exercise 0   Patient Education   Previous Diabetes Education No   Disease state  Definition of diabetes, type 1 and 2, and the diagnosis of diabetes;Factors that contribute to  the development of diabetes   Nutrition management  Role of diet in the treatment of diabetes and the relationship between the three main macronutrients and blood glucose level;Food label reading, portion sizes and measuring food.;Meal options for control of blood glucose level and chronic complications.   Physical activity and exercise  Role of exercise on diabetes management, blood pressure control and cardiac health.;Helped patient identify appropriate exercises in relation to his/her diabetes, diabetes complications and other health issue.   Monitoring Identified appropriate SMBG and/or A1C goals.   Psychosocial adjustment Role of stress on diabetes   Individualized Goals (developed by patient)    Nutrition General guidelines for healthy choices and portions discussed   Physical Activity Exercise 3-5 times per week;15 minutes per day   Medications take my medication as prescribed   Monitoring  test my blood glucose as discussed   Outcomes   Expected Outcomes Demonstrated interest in learning. Expect positive outcomes   Future DMSE PRN   Program Status Completed      Individualized Plan for Diabetes Self-Management Training:   Learning Objective:  Patient will have a greater understanding of diabetes self-management. Patient education plan is to attend individual and/or group sessions per assessed needs and concerns.   Plan:   Patient Instructions  Plan:  Aim for 2-3 Carb Choices per meal (30-45 grams) +/- 1 either way  Aim for 0-1 Carbs per snack if hungry  Include protein in moderation with your meals and snacks Consider reading food labels for Total Carbohydrate and Fat Grams of foods Consider  increasing your activity level by arm chair exercises or walking for 5 minutes daily as tolerated and increase slowly to 15 minutes. Consider checking BG at alternate times per day as directed by MD  Consider taking medication as directed by MD    Expected Outcomes:  Demonstrated interest in learning. Expect positive outcomes  Education material provided: Living Well with Diabetes, Food label handouts, Meal plan card, My Plate and Snack sheet  If problems or questions, patient to contact team via:  Phone and Email  Future DSME appointment: PRN

## 2016-01-06 NOTE — Patient Instructions (Signed)
Plan:  Aim for 2-3 Carb Choices per meal (30-45 grams) +/- 1 either way  Aim for 0-1 Carbs per snack if hungry  Include protein in moderation with your meals and snacks Consider reading food labels for Total Carbohydrate and Fat Grams of foods Consider  increasing your activity level by arm chair exercises or walking for 5 minutes daily as tolerated and increase slowly to 15 minutes. Consider checking BG at alternate times per day as directed by MD  Consider taking medication as directed by MD

## 2016-01-07 ENCOUNTER — Telehealth: Payer: Self-pay | Admitting: *Deleted

## 2016-01-07 NOTE — Telephone Encounter (Signed)
Pt has been notified of lab results by phone with verbal understanding. 

## 2016-02-26 ENCOUNTER — Other Ambulatory Visit: Payer: Self-pay | Admitting: Physician Assistant

## 2016-02-26 NOTE — Telephone Encounter (Signed)
Please review for refill, Thank you. 

## 2016-02-26 NOTE — Telephone Encounter (Signed)
Rx(s) sent to pharmacy electronically.  

## 2016-04-27 ENCOUNTER — Telehealth: Payer: Self-pay | Admitting: Cardiology

## 2016-04-27 NOTE — Telephone Encounter (Signed)
Received [employer] Disability forms for [name] Spanish to review, complete and sign.  Received via [place].  Sent [employer] forms to [employer] @ [facility] to sent letter/packet to obtain signed AUTH/Pmt.  Sent to [employer] via [place] on 04/27/16. lp

## 2016-07-19 NOTE — Progress Notes (Signed)
Cardiology Office Note:    Date:  07/20/2016   ID:  [name], DOB [birth year], MRN [id number]  PCP:  Susan Downs, MD  Cardiologist:  Dr. Rollene Knapp   Electrophysiologist:  n/a  Referring MD: Susan Downs, MD   No chief complaint on file. FU - HTN, CHF  History of Present Illness:    [name] is a 60 y.o. female with a hx of a long hx of uncontrolled HTN (poor adherence due to cost and adverse effects), tobacco abuse.  She was admitted 06/2014 with acute combined systolic and diastolic CHF and a/c renal failure (peak Cr 2.15) in the setting of hypertensive urgency with associated elevated in troponin (peak 0.49).   Echo demonstrated reduced LV function with EF 20-25%.  ACEI was not started due to CKD.  Patient noted hx of true allergy to Hydralazine.  She was placed on Coreg and Isosorbide.  Coreg was switched to Metoprolol.  She underwent noninvasive testing with Lexiscan Myoview instead of L heart cath due to CKD.  Myoview demonstrated no scar or ischemia.  Elevated troponin was felt to be from demand ischemia.  Etiology of cardiomyopathy was felt to be non-ischemic and probably related to uncontrolled HTN.     Renal artery [place] 8/15 was neg for RAS.  Follow-up echo in 2/16 demonstrated improved LV function with an EF of 50-55% and moderate diastolic dysfunction.   She was admitted in 12/16 with DKA and pancreatitis in the setting of newly dx DM2.     Last seen by me in 1/17. She returns for follow-up. Overall, she is doing well. She denies significant changes in her dyspnea with exertion. She denies chest pain. She denies orthopnea, PND. LE edema is stable. She occasionally has to take an extra dose of Lasix for increased weight or increased swelling. Her primary care doctor wants her to see nephrology. She was referred to a doctor in [place] but prefers to see someone here in [place].  Prior CV studies that were reviewed today include:    Echo (01/27/15):  Mild LVH, EF 50-55%,  normal wall motion, grade 2 diastolic dysfunction   Echo (06/2014):   Severe LVH, EF 20-25%, no RWMA, Gr 3 DD, trivial MR, mild LAE, mild RVE, PASP 38 mmHg.   Nuclear (06/2014):   EF 32%, apical thinning, no definite scar or ischemia    Past Medical History:  Diagnosis Date  . Chronic combined systolic and diastolic CHF (congestive heart failure) (HCC)    a. Echo (06/2014): Severe LVH, EF 20-25%, no RWMA, Gr 3 DD, trivial MR, mild LAE, mild RVE, PASP 38 mmHg .>> b. Echo 2/16 EF 50-55%  . CKD (chronic kidney disease)   . Diabetes mellitus without complication (HCC)   . Diverticular disease   . Hypertension   . Hypertensive heart disease    a. Renal Artery Duplex (8/15):  no RAS  . NICM (nonischemic cardiomyopathy) (HCC)    a. Nuclear (06/2014): EF 32%, apical thinning, no definite scar or ischemia;  b. Echo (2/16):  Mild LVH, EF 50-55%, Gr 2 DD, no RWMA  . PUD (peptic ulcer disease)   . UTI (lower urinary tract infection)     Past Surgical History:  Procedure Laterality Date  . DILATION AND CURETTAGE OF UTERUS    . None      Current Medications: Outpatient Medications Prior to Visit  Medication Sig Dispense Refill  . acetaminophen (TYLENOL) 500 MG tablet Take 1,000 mg by mouth every 6 (six) hours as  needed for mild pain, moderate pain or headache. For pain    . aspirin EC 81 MG EC tablet Take 1 tablet (81 mg total) by mouth daily.    . B-D ULTRAFINE III SHORT PEN 31G X 8 MM MISC USE AS DIRECTED *EMERGENCY FILL*  0  . blood glucose meter kit and supplies KIT Dispense based on patient and insurance preference. Use up to four times daily as directed. (FOR ICD-9 250.00, 250.01). 1 each 0  . insulin aspart (NOVOLOG FLEXPEN) 100 UNIT/ML FlexPen Inject 5 Units into the skin 3 (three) times daily with meals. Additional insulin (5 units PLUS) CBG 70 - 120: 0 units CBG 121 - 150: 1 unit CBG 151 - 200: 2 units CBG 201 - 250: 3 units CBG 251 - 350: 4 units CBG 351 - 400: 6 units 15 mL 11   . Insulin Glargine (LANTUS SOLOSTAR) 100 UNIT/ML Solostar Pen Inject 15 Units into the skin daily at 10 pm. 15 mL 11  . ONE TOUCH ULTRA TEST test strip 1 each by Other route 4 (four) times daily.   1  . ONETOUCH DELICA LANCETS 84X MISC Inject 33 g into the skin 4 (four) times daily.   1  . amLODipine (NORVASC) 10 MG tablet Take 1 tablet (10 mg total) by mouth daily. 90 tablet 3  . atorvastatin (LIPITOR) 40 MG tablet Take 1 tablet (40 mg total) by mouth daily at 6 PM. 90 tablet 3  . cloNIDine (CATAPRES) 0.2 MG tablet TAKE 1 TABLET (0.2 MG TOTAL) BY MOUTH 2 (TWO) TIMES DAILY. 180 tablet 2  . furosemide (LASIX) 20 MG tablet Take 1 tablet (20 mg total) by mouth every other day. 90 tablet 3  . isosorbide mononitrate (IMDUR) 120 MG 24 hr tablet TAKE 1 TABLET EVERY DAY 30 tablet 5  . metoprolol tartrate (LOPRESSOR) 25 MG tablet Take 1 tablet (25 mg total) by mouth 2 (two) times daily. 180 tablet 3  . terazosin (HYTRIN) 1 MG capsule Take 1 capsule (1 mg total) by mouth at bedtime. 90 capsule 3   No facility-administered medications prior to visit.       Allergies:   Hydralazine and Other   Social History   Social History  . Marital status: Married    Spouse name: N/A  . Number of children: 2  . Years of education: 7   Social History Main Topics  . Smoking status: Former Smoker    Quit date: 07/05/2014  . Smokeless tobacco: Never Used  . Alcohol use No  . Drug use: No  . Sexual activity: Not Asked   Other Topics Concern  . None   Social History Narrative   Currently lives in a house with her husband.    Fun: Watch TV.   Denies religious beliefs effecting health care.      Family History:  The patient's family history includes Heart attack in her paternal grandfather; Hypertension in her paternal grandmother; Kidney disease in her mother; Stroke in her paternal grandmother.   ROS:   Please see the history of present illness.    Review of Systems  Cardiovascular: Positive for leg  swelling.   All other systems reviewed and are negative.   EKGs/Labs/Other Test Reviewed:    EKG:  EKG is  ordered today.  The ekg ordered today demonstrates Sinus bradycardia, HR 55, normal axis, nonspecific ST-T wave changes, septal Q waves, no change from prior tracing, QTc 463 ms  Recent Labs: 12/06/2015: ALT 24 12/08/2015:  Hemoglobin 11.3; Platelets 176 01/06/2016: BUN 25; Creat 1.55; Potassium 4.1; Sodium 139   Recent Lipid Panel    Component Value Date/Time   CHOL 81 12/07/2015 0449   TRIG 114 12/07/2015 0449   HDL 25 (L) 12/07/2015 0449   CHOLHDL 3.2 12/07/2015 0449   VLDL 23 12/07/2015 0449   LDLCALC 33 12/07/2015 0449     Physical Exam:    VS:  BP 138/78   Pulse (!) 55   Ht 5' (1.524 m)   Wt 178 lb 12.8 oz (81.1 kg)   BMI 34.92 kg/m     Wt Readings from Last 3 Encounters:  07/20/16 178 lb 12.8 oz (81.1 kg)  01/06/16 188 lb (85.3 kg)  12/24/15 192 lb 1.9 oz (87.1 kg)     Physical Exam  Constitutional: She is oriented to person, place, and time. She appears well-developed and well-nourished. No distress.  HENT:  Head: Normocephalic and atraumatic.  Neck: No JVD present.  Cardiovascular: Normal rate, regular rhythm and normal heart sounds.   No murmur heard. Pulmonary/Chest: Effort normal and breath sounds normal. She has no wheezes. She has no rales.  Abdominal: Soft. There is no tenderness.  Musculoskeletal:  Trace bilateral LE edema with bilateral compression stockings in place  Neurological: She is alert and oriented to person, place, and time.  Skin: Skin is warm and dry.  Psychiatric: She has a normal mood and affect.    ASSESSMENT:    1. Hypertensive heart disease with heart failure ([place])   2. Chronic diastolic CHF (congestive heart failure) ([place])   3. NICM (nonischemic cardiomyopathy) ([place])   4. CKD (chronic kidney disease), stage 3 (moderate)   5. Hyperlipidemia    PLAN:    In order of problems listed above:  1. Hypertensive heart  disease with heart failure:  Blood pressure controlled. Continue current regimen which includes amlodipine, clonidine, Lasix, isosorbide, Hytrin, metoprolol.   2. Chronic Diastolic CHF:   Overall volume stable. Continue current regimen.   3. NICM (nonischemic cardiomyopathy):  Likely related to HTN.  Continue beta blocker, nitrates.  She is not on ACEI/ARB or spironolactone 2/2 CKD.  She has a true allergy to Hydralazine.  Echo in 2/16 demonstrated improved LV function.    4. CKD (chronic kidney disease):   Obtain follow-up BMET. Referred to nephrology in [place].    5. Hyperlipidemia: Recent LDL optimal. Continue Lipitor.    Medication Adjustments/Labs and Tests Ordered: Current medicines are reviewed at length with the patient today.  Concerns regarding medicines are outlined above.  Medication changes, Labs and Tests ordered today are outlined in the Patient Instructions noted below. Patient Instructions  Medication Instructions:  Your physician recommends that you continue on your current medications as directed. Please refer to the Current Medication list given to you today. Labwork: TODAY BMET Testing/Procedures: NONE Follow-Up: Your physician wants you to follow-up in: [place], [place].  You will receive a reminder letter in the mail two months in advance. If you don't receive a letter, please call our office to schedule the follow-up appointment. [facility] BEING [facility] Any Other Special Instructions Will Be Listed Below (If Applicable). If you need a refill on your cardiac medications before your next appointment, please call your pharmacy.  Signed, Richardson Dopp, PA-C  07/20/2016 5:26 PM    [facility] [address], [place], [place]  [address] Phone: [phone]; Fax: [phone]

## 2016-07-20 ENCOUNTER — Ambulatory Visit (INDEPENDENT_AMBULATORY_CARE_PROVIDER_SITE_OTHER): Payer: BLUE CROSS/BLUE SHIELD | Admitting: Physician Assistant

## 2016-07-20 ENCOUNTER — Encounter: Payer: Self-pay | Admitting: Physician Assistant

## 2016-07-20 VITALS — BP 138/78 | HR 55 | Ht 60.0 in | Wt 178.8 lb

## 2016-07-20 DIAGNOSIS — I110 Hypertensive heart disease with heart failure: Secondary | ICD-10-CM | POA: Diagnosis not present

## 2016-07-20 DIAGNOSIS — N183 Chronic kidney disease, stage 3 (moderate): Secondary | ICD-10-CM | POA: Diagnosis not present

## 2016-07-20 DIAGNOSIS — I429 Cardiomyopathy, unspecified: Secondary | ICD-10-CM

## 2016-07-20 DIAGNOSIS — I428 Other cardiomyopathies: Secondary | ICD-10-CM

## 2016-07-20 DIAGNOSIS — I5032 Chronic diastolic (congestive) heart failure: Secondary | ICD-10-CM | POA: Diagnosis not present

## 2016-07-20 DIAGNOSIS — E785 Hyperlipidemia, unspecified: Secondary | ICD-10-CM

## 2016-07-20 MED ORDER — CLONIDINE HCL 0.2 MG PO TABS: ORAL_TABLET | ORAL | 2 refills | Status: DC

## 2016-07-20 MED ORDER — FUROSEMIDE 20 MG PO TABS: 20.0000 mg | ORAL_TABLET | ORAL | 3 refills | Status: DC

## 2016-07-20 MED ORDER — ISOSORBIDE MONONITRATE ER 120 MG PO TB24: 120.0000 mg | ORAL_TABLET | Freq: Every day | ORAL | 3 refills | Status: DC

## 2016-07-20 MED ORDER — ATORVASTATIN CALCIUM 40 MG PO TABS: 40.0000 mg | ORAL_TABLET | Freq: Every day | ORAL | 3 refills | Status: DC

## 2016-07-20 MED ORDER — AMLODIPINE BESYLATE 10 MG PO TABS: 10.0000 mg | ORAL_TABLET | Freq: Every day | ORAL | 3 refills | Status: DC

## 2016-07-20 MED ORDER — METOPROLOL TARTRATE 25 MG PO TABS: 25.0000 mg | ORAL_TABLET | Freq: Two times a day (BID) | ORAL | 3 refills | Status: DC

## 2016-07-20 MED ORDER — TERAZOSIN HCL 1 MG PO CAPS: 1.0000 mg | ORAL_CAPSULE | Freq: Every day | ORAL | 3 refills | Status: DC

## 2016-07-20 NOTE — Patient Instructions (Addendum)
Medication Instructions:  Your physician recommends that you continue on your current medications as directed. Please refer to the Current Medication list given to you today. Labwork: TODAY BMET Testing/Procedures: NONE Follow-Up: Your physician wants you to follow-up in: [place], PAC.  You will receive a reminder letter in the mail two months in advance. If you don't receive a letter, please call our office to schedule the follow-up appointment. YOU ARE BEING REFERRED TO [facility] Any Other Special Instructions Will Be Listed Below (If Applicable). If you need a refill on your cardiac medications before your next appointment, please call your pharmacy.

## 2016-07-21 ENCOUNTER — Telehealth: Payer: Self-pay | Admitting: *Deleted

## 2016-07-21 LAB — BASIC METABOLIC PANEL
BUN: 43 mg/dL — ABNORMAL HIGH (ref 7–25)
CO2: 24 mmol/L (ref 20–31)
Calcium: 9.1 mg/dL (ref 8.6–10.4)
Chloride: 105 mmol/L (ref 98–110)
Creat: 1.57 mg/dL — ABNORMAL HIGH (ref 0.50–0.99)
Glucose, Bld: 81 mg/dL (ref 65–99)
Potassium: 4 mmol/L (ref 3.5–5.3)
Sodium: 141 mmol/L (ref 135–146)

## 2016-07-21 NOTE — Telephone Encounter (Signed)
Ok to leave detailed  message on machine, lab work ok. If any questions cb [phone].

## 2016-09-09 ENCOUNTER — Other Ambulatory Visit: Payer: Self-pay | Admitting: Cardiology

## 2016-11-08 ENCOUNTER — Telehealth: Payer: Self-pay | Admitting: Physician Assistant

## 2016-11-08 MED ORDER — FUROSEMIDE 20 MG PO TABS: 20.0000 mg | ORAL_TABLET | Freq: Every day | ORAL | 3 refills | Status: DC

## 2016-11-08 NOTE — Telephone Encounter (Signed)
Med refilled.

## 2016-11-08 NOTE — Telephone Encounter (Signed)
New message  Diabetes/takes fluid pills every day  Needs to get new RX for every day/old RX is for every other day  forosemide 20mg 

## 2016-11-09 ENCOUNTER — Telehealth: Payer: Self-pay | Admitting: *Deleted

## 2016-11-09 ENCOUNTER — Telehealth: Payer: Self-pay | Admitting: Physician Assistant

## 2016-11-09 DIAGNOSIS — Z79899 Other long term (current) drug therapy: Secondary | ICD-10-CM

## 2016-11-09 MED ORDER — FUROSEMIDE 20 MG PO TABS: 20.0000 mg | ORAL_TABLET | ORAL | 1 refills | Status: DC

## 2016-11-09 NOTE — Telephone Encounter (Signed)
Patient unreachable by phone, as before.   I have spoken w Nicki Reaper informing him of situation -I refilled med was initially for daily, as patient reported taking. I did inform him I missed opportunity yesterday to document a call attempt but that the patient was unreachable by phone yesterday (no answer or VM pickup when dialed).  Scott aware I have clarified w pharmacy to keep patient on original dose but to have patient reach out to our office and notify us if she has been taking medication daily or every other day, and if so, how long since she changed frequency/dosage of medication.  For revision, I am submitting the original dosage electronically to her new pharmacy w instructions for patient to call.

## 2016-11-09 NOTE — Telephone Encounter (Signed)
Pharmacy contacted regarding listed use of lasix per Richardson Dopp - every other day 20mg .

## 2016-11-09 NOTE — Telephone Encounter (Signed)
New message  Pt is calling a second time  Requesting new RX for forosemide20mg  to be taken every day instead of every other day  Pharm still has not received update RX  [facility]

## 2016-11-09 NOTE — Telephone Encounter (Signed)
Unable to reach patient. Phone rings w/o answer or VM pickup.

## 2016-11-10 NOTE — Telephone Encounter (Signed)
Continue taking it the way she is.  Dr. Lemmie Evens

## 2016-11-10 NOTE — Telephone Encounter (Signed)
Pt of Dr. Percival Spanish - is followed by Richardson Dopp for appts at [facility] office  I spoke w/ patient. She explains that about 1 month ago, she started taking lasix 20mg  daily instead of every other day. She did not receive instruction to do this. She denies any acute symptoms that factored into her decision. States that historically, she had been on lasix daily, but that due to a hospitalization involving diabetes and dehydration, she was scared to resume taking it daily, so at the post-hospital visit Nicki Reaper suggested every other day use.  She notes she has swelling "all the time", this is not noticeably changed but that it only resolves w/ administration of lasix. No new or increased swelling. She denies SOB, fatigue, or other symptoms.  She's aware I had discussed yesterday w Nicki Reaper - he will want to review and may want to see her for OV or bloodwork. Pt agreeable to this.  Dr. Percival Spanish, Nicki Reaper are out of office. Routed to [employer] for any concerns today, will follow up on this as instructed.

## 2016-11-10 NOTE — Telephone Encounter (Signed)
See previous phone note from Charlotte Sanes, Therapist, sports. Pt needs to call the office.

## 2016-11-10 NOTE — Telephone Encounter (Signed)
Patient advised on labwork. I discussed that I could send orders to [facility] so that she can get done in [place]. Pt requested orders be set up so she can come to [facility] office to draw. I have made her appt and ordered labwork, and instructed her to call if she has any further needs. Pt voiced thanks for call & understanding of recommendations.

## 2016-11-10 NOTE — Telephone Encounter (Signed)
Patient notified on instruction to continue med daily per [employer] review.  She noted pharmacy would need authorization to fill daily, o/w she cannot obtain refill until January. However, she has 11 tablets on-hand so does not need med refill at this time. Pt wanted to reiterate that now that her diabetes is well-managed, she is not afraid to take the furosemide because she is no longer having increased urination r/t blood sugar elevations. Aware I will route to [facility] for OK on refill when he returns to office.

## 2016-11-10 NOTE — Telephone Encounter (Signed)
Pt is returning [place] call

## 2016-11-10 NOTE — Telephone Encounter (Signed)
Attempted to reach patient again today. Her phone rang to VM and I was able to leave message to request call back.

## 2016-11-10 NOTE — Telephone Encounter (Signed)
Arrange follow up BMET. Richardson Dopp, PA-C   11/10/2016 4:29 PM

## 2016-11-16 ENCOUNTER — Other Ambulatory Visit: Payer: BLUE CROSS/BLUE SHIELD | Admitting: *Deleted

## 2016-11-16 DIAGNOSIS — Z79899 Other long term (current) drug therapy: Secondary | ICD-10-CM

## 2016-11-16 LAB — BASIC METABOLIC PANEL
BUN: 30 mg/dL — ABNORMAL HIGH (ref 7–25)
CO2: 27 mmol/L (ref 20–31)
Calcium: 8.9 mg/dL (ref 8.6–10.4)
Chloride: 106 mmol/L (ref 98–110)
Creat: 1.69 mg/dL — ABNORMAL HIGH (ref 0.50–0.99)
Glucose, Bld: 75 mg/dL (ref 65–99)
Potassium: 4.5 mmol/L (ref 3.5–5.3)
Sodium: 141 mmol/L (ref 135–146)

## 2016-11-19 ENCOUNTER — Telehealth: Payer: Self-pay | Admitting: *Deleted

## 2016-11-19 NOTE — Telephone Encounter (Signed)
Pt notified of lab results by phone with verbal understanding. I went over lasix instructions with pt as there was some confusion last week. I advised pt she is only to be taking the lasix 20 mg 1 tablet every other day. Pt asked if a new Rx could be sent in because she ran out. I advised she ran out because she was taking it everyday when her directions were for 20 mg every other day. I advised pt [facility] [place] I advised [facility] and make sure [facility] is still ready for her. Pt thanked me for my time.

## 2017-01-17 ENCOUNTER — Ambulatory Visit (INDEPENDENT_AMBULATORY_CARE_PROVIDER_SITE_OTHER): Payer: PPO | Admitting: Physician Assistant

## 2017-01-17 ENCOUNTER — Encounter: Payer: Self-pay | Admitting: Physician Assistant

## 2017-01-17 VITALS — BP 150/90 | HR 64 | Ht 62.0 in | Wt 190.4 lb

## 2017-01-17 DIAGNOSIS — I5032 Chronic diastolic (congestive) heart failure: Secondary | ICD-10-CM | POA: Diagnosis not present

## 2017-01-17 DIAGNOSIS — E785 Hyperlipidemia, unspecified: Secondary | ICD-10-CM

## 2017-01-17 DIAGNOSIS — N183 Chronic kidney disease, stage 3 unspecified: Secondary | ICD-10-CM

## 2017-01-17 DIAGNOSIS — I110 Hypertensive heart disease with heart failure: Secondary | ICD-10-CM

## 2017-01-17 MED ORDER — FUROSEMIDE 20 MG PO TABS: 20.0000 mg | ORAL_TABLET | Freq: Every day | ORAL | 3 refills | Status: DC

## 2017-01-17 MED ORDER — ATORVASTATIN CALCIUM 40 MG PO TABS: 40.0000 mg | ORAL_TABLET | Freq: Every day | ORAL | 3 refills | Status: DC

## 2017-01-17 MED ORDER — TERAZOSIN HCL 1 MG PO CAPS: 1.0000 mg | ORAL_CAPSULE | Freq: Every day | ORAL | 3 refills | Status: DC

## 2017-01-17 MED ORDER — METOPROLOL TARTRATE 25 MG PO TABS: 25.0000 mg | ORAL_TABLET | Freq: Two times a day (BID) | ORAL | 3 refills | Status: DC

## 2017-01-17 MED ORDER — ISOSORBIDE MONONITRATE ER 120 MG PO TB24: 120.0000 mg | ORAL_TABLET | Freq: Every day | ORAL | 3 refills | Status: DC

## 2017-01-17 NOTE — Progress Notes (Signed)
Cardiology Office Note:    Date:  01/17/2017   ID:  [name], DOB [birth year], MRN [id number]  PCP:  [name], MD  Cardiologist:  Dr. Junius Creamer, PA-C   Electrophysiologist:  n/a  Referring MD: [name], MD   Chief Complaint  Patient presents with  . Follow-up    CHF, HTN    History of Present Illness:    [name] is a 61 y.o. female with a hx of uncontrolled HTN (poor adherence due to cost and adverse effects), tobacco abuse.She was admitted 06/2014 with acute combined systolic and diastolic CHF and a/c renal failure (peak Cr 2.15) in the setting of hypertensive urgency with associated elevated in troponin (peak 0.49).Echo demonstrated reduced LV function with EF 20-25%.ACEI was not started due to CKD.Patient noted hx of true allergy to Hydralazine.She was placed on Coreg and Isosorbide.Coreg was switched to Metoprolol.She underwent noninvasive testing with Lexiscan Myoview instead of L heart cath due to CKD.Myoview demonstrated no scar or ischemia.Elevated troponin was felt to be from demand ischemia.Etiology of cardiomyopathy was felt to be non-ischemic and probably related to uncontrolled HTN.  Renal artery [place] 8/15 was neg for RAS.Follow-up echo in 2/16 demonstrated improved LV function with an EF of 50-55% and moderate diastolic dysfunction.  Last seen here by me in 8/17.  She returns for Cardiology follow up.  She is doing well.  She continues to have fluctuations in her weight along with LE edema and abdominal bloating.  She had increased Lasix on her own and felt better when she took Lasix QD.  She denies significant shortness of breath.  She denies syncope, chest pain, orthopnea, PND, cough.    Prior CV studies that were reviewed today include:    Echo (01/27/15):  Mild LVH, EF 50-55%, normal wall motion, grade 2 diastolic dysfunction  Echo (06/2014): Severe LVH, EF 20-25%, no RWMA, Gr 3 DD, trivial MR, mild LAE, mild RVE,  PASP 38 mmHg.  Nuclear (06/2014): EF 32%, apical thinning, no definite scar or ischemia  Past Medical History:  Diagnosis Date  . Chronic combined systolic and diastolic CHF (congestive heart failure) ([place])    a. Echo (06/2014): Severe LVH, EF 20-25%, no RWMA, Gr 3 DD, trivial MR, mild LAE, mild RVE, PASP 38 mmHg .>> b. Echo 2/16 EF 50-55%  . CKD (chronic kidney disease)   . Diabetes mellitus without complication ([place])   . Diverticular disease   . Hypertension   . Hypertensive heart disease    a. Renal Artery Duplex (8/15):  no RAS  . NICM (nonischemic cardiomyopathy) ([place])    a. Nuclear (06/2014): EF 32%, apical thinning, no definite scar or ischemia;  b. Echo (2/16):  Mild LVH, EF 50-55%, Gr 2 DD, no RWMA  . PUD (peptic ulcer disease)   . UTI (lower urinary tract infection)     Past Surgical History:  Procedure Laterality Date  . DILATION AND CURETTAGE OF UTERUS    . None      Current Medications: Current Meds  Medication Sig  . acetaminophen (TYLENOL) 500 MG tablet Take 1,000 mg by mouth every 6 (six) hours as needed for mild pain, moderate pain or headache. For pain  . amLODipine (NORVASC) 10 MG tablet Take 1 tablet (10 mg total) by mouth daily.  Marland Kitchen aspirin EC 81 MG EC tablet Take 1 tablet (81 mg total) by mouth daily.  Marland Kitchen atorvastatin (LIPITOR) 40 MG tablet Take 1 tablet (40 mg total) by mouth daily at 6 PM.  .  B-D ULTRAFINE III SHORT PEN 31G X 8 MM MISC USE AS DIRECTED *EMERGENCY FILL*  . blood glucose meter kit and supplies KIT Dispense based on patient and insurance preference. Use up to four times daily as directed. (FOR ICD-9 250.00, 250.01).  . cloNIDine (CATAPRES) 0.2 MG tablet TAKE 1 TABLET (0.2 MG TOTAL) BY MOUTH 2 (TWO) TIMES DAILY.  Marland Kitchen gabapentin (NEURONTIN) 100 MG capsule Take 100 mg by mouth 3 (three) times daily.  . insulin aspart (NOVOLOG FLEXPEN) 100 UNIT/ML FlexPen Inject 5 Units into the skin 3 (three) times daily with meals. Additional insulin (5 units  PLUS) CBG 70 - 120: 0 units CBG 121 - 150: 1 unit CBG 151 - 200: 2 units CBG 201 - 250: 3 units CBG 251 - 350: 4 units CBG 351 - 400: 6 units  . Insulin Glargine (LANTUS SOLOSTAR) 100 UNIT/ML Solostar Pen Inject 15 Units into the skin daily at 10 pm.  . isosorbide mononitrate (IMDUR) 120 MG 24 hr tablet Take 1 tablet (120 mg total) by mouth daily.  . metoprolol tartrate (LOPRESSOR) 25 MG tablet Take 1 tablet (25 mg total) by mouth 2 (two) times daily.  . ONE TOUCH ULTRA TEST test strip 1 each by Other route 4 (four) times daily.   Glory Rosebush DELICA LANCETS 81W MISC Inject 33 g into the skin 4 (four) times daily.   Marland Kitchen terazosin (HYTRIN) 1 MG capsule Take 1 capsule (1 mg total) by mouth at bedtime.  . [DISCONTINUED] atorvastatin (LIPITOR) 40 MG tablet Take 1 tablet (40 mg total) by mouth daily at 6 PM.  . [DISCONTINUED] atorvastatin (LIPITOR) 40 MG tablet Take 1 tablet (40 mg total) by mouth daily at 6 PM.  . [DISCONTINUED] furosemide (LASIX) 20 MG tablet Take 1 tablet (20 mg total) by mouth every other day.  . [DISCONTINUED] isosorbide mononitrate (IMDUR) 120 MG 24 hr tablet Take 1 tablet (120 mg total) by mouth daily.  . [DISCONTINUED] metoprolol tartrate (LOPRESSOR) 25 MG tablet Take 1 tablet (25 mg total) by mouth 2 (two) times daily.  . [DISCONTINUED] terazosin (HYTRIN) 1 MG capsule Take 1 capsule (1 mg total) by mouth at bedtime.     Allergies:   Hydralazine and Other   Social History   Social History  . Marital status: Married    Spouse name: N/A  . Number of children: 2  . Years of education: 7   Social History Main Topics  . Smoking status: Former Smoker    Quit date: 07/05/2014  . Smokeless tobacco: Never Used  . Alcohol use No  . Drug use: No  . Sexual activity: Not Asked   Other Topics Concern  . None   Social History Narrative   Currently lives in a house with her husband.    Fun: Watch TV.   Denies religious beliefs effecting health care.      Family History:   The patient's family history includes Heart attack in her paternal grandfather; Hypertension in her paternal grandmother; Kidney disease in her mother; Stroke in her paternal grandmother.   ROS:   Please see the history of present illness.    Review of Systems  Cardiovascular: Positive for leg swelling.  Gastrointestinal: Positive for constipation.   All other systems reviewed and are negative.   EKGs/Labs/Other Test Reviewed:    EKG:  EKG is not ordered today.  The ekg ordered today demonstrates n/a  Recent Labs: 11/16/2016: BUN 30; Creat 1.69; Potassium 4.5; Sodium 141   Recent Lipid  Panel    Component Value Date/Time   CHOL 81 12/07/2015 0449   TRIG 114 12/07/2015 0449   HDL 25 (L) 12/07/2015 0449   CHOLHDL 3.2 12/07/2015 0449   VLDL 23 12/07/2015 0449   LDLCALC 33 12/07/2015 0449     Physical Exam:    VS:  BP (!) 150/90   Pulse 64   Ht _0  (1.575 m)   Wt 190 lb 6.4 oz (86.4 kg)   BMI 34.82 kg/m     Wt Readings from Last 3 Encounters:  01/17/17 190 lb 6.4 oz (86.4 kg)  07/20/16 178 lb 12.8 oz (81.1 kg)  01/06/16 188 lb (85.3 kg)     Physical Exam  Constitutional: She is oriented to person, place, and time. She appears well-developed and well-nourished. No distress.  HENT:  Head: Normocephalic and atraumatic.  Eyes: No scleral icterus.  Neck: No JVD present.  Cardiovascular: Normal rate, regular rhythm and normal heart sounds.   No murmur heard. Pulmonary/Chest: Effort normal. She has no wheezes. She has no rales.  Abdominal: Soft. There is no tenderness.  Musculoskeletal: She exhibits edema.  Trace bilateral LE edema  Neurological: She is alert and oriented to person, place, and time.  Skin: Skin is warm and dry.  Psychiatric: She has a normal mood and affect.    ASSESSMENT:    1. Hypertensive heart disease with heart failure ([place])   2. Chronic diastolic CHF (congestive heart failure) ([facility])   3. Stage 3 chronic kidney disease   4.  Hyperlipidemia, unspecified hyperlipidemia type    PLAN:    In order of problems listed above:  1.Hypertensive heart disease with heart failure:EF was 20-25 and improved to normal by Echo in 2/16.  BP is elevated today.  Her BP is usually optimal at home (130/70s).  She watches her salt intake and has not missed any medications.  I have asked her to continue to monitor her BP and let me know if it remains high like today.  2.Chronic Diastolic DGU:YQIHKV fluctuates.  She feels better on Lasix 20 mg QD.    -  Increase Lasix to 20 mg QD  -  BMET in 1-2 weeks.  3. CKD (chronic kidney disease): I referred her to Nephrology in August but she was not contacted.  Will resubmit the referral.  Check BMET in 1-2 weeks.   4. Hyperlipidemia:  LDL in 12/16 was 33.  Arrange FU lipids and LFTs.    Medication Adjustments/Labs and Tests Ordered: Current medicines are reviewed at length with the patient today.  Concerns regarding medicines are outlined above.  Medication changes, Labs and Tests ordered today are outlined in the Patient Instructions noted below. Patient Instructions  Medication Instructions:  1. 1. INCREASE LASIX TO 20 MG EVERY DAY; NEW RX SENT IN  Labwork: IN 1-2 WEEKS FOR BMET, FASTING LIPID AND LIVER PANEL  Testing/Procedures: NONE  Follow-Up: 1. Your physician wants you to follow-up in: [place], [place] . You will receive a reminder letter in the mail two months in advance. If you don't receive a letter, please call our office to schedule the follow-up appointment.  2. YOU ARE BEING REFERRED TO [facility]   Any Other Special Instructions Will Be Listed Below (If Applicable).  If you need a refill on your cardiac medications before your next appointment, please call your pharmacy.  Signed, Richardson Dopp, PA-C  01/17/2017 5:08 PM    [facility]  [address] [place], [place]  [address] Phone: [phone]; Fax: [phone]

## 2017-01-17 NOTE — Patient Instructions (Addendum)
Medication Instructions:  1. 1. INCREASE LASIX TO 20 MG EVERY DAY; NEW RX SENT IN  Labwork: IN 1-2 WEEKS FOR BMET, FASTING LIPID AND LIVER PANEL  Testing/Procedures: NONE  Follow-Up: 1. Your physician wants you to follow-up in: [place], [place] . You will receive a reminder letter in the mail two months in advance. If you don't receive a letter, please call our office to schedule the follow-up appointment.  2. YOU ARE BEING REFERRED TO [facility]   Any Other Special Instructions Will Be Listed Below (If Applicable).  If you need a refill on your cardiac medications before your next appointment, please call your pharmacy.

## 2017-01-18 MED ORDER — ATORVASTATIN CALCIUM 40 MG PO TABS: 40.0000 mg | ORAL_TABLET | Freq: Every day | ORAL | 3 refills | Status: DC

## 2017-01-18 NOTE — Addendum Note (Signed)
Addended by: Briant Cedar on: 01/18/2017 08:44 AM   Modules accepted: Orders

## 2017-01-31 ENCOUNTER — Other Ambulatory Visit: Payer: PPO | Admitting: *Deleted

## 2017-01-31 DIAGNOSIS — I5032 Chronic diastolic (congestive) heart failure: Secondary | ICD-10-CM | POA: Diagnosis not present

## 2017-01-31 LAB — BASIC METABOLIC PANEL
BUN/Creatinine Ratio: 19 (ref 12–28)
BUN: 31 mg/dL — ABNORMAL HIGH (ref 8–27)
CO2: 23 mmol/L (ref 18–29)
Calcium: 9 mg/dL (ref 8.7–10.3)
Chloride: 101 mmol/L (ref 96–106)
Creatinine, Ser: 1.6 mg/dL — ABNORMAL HIGH (ref 0.57–1.00)
GFR calc Af Amer: 40 mL/min/{1.73_m2} — ABNORMAL LOW (ref >59)
GFR calc non Af Amer: 34 mL/min/{1.73_m2} — ABNORMAL LOW (ref >59)
Glucose: 136 mg/dL — ABNORMAL HIGH (ref 65–99)
Potassium: 4.8 mmol/L (ref 3.5–5.2)
Sodium: 142 mmol/L (ref 134–144)

## 2017-01-31 LAB — HEPATIC FUNCTION PANEL
ALT: 17 IU/L (ref 0–32)
AST: 15 IU/L (ref 0–40)
Albumin: 3.7 g/dL (ref 3.6–4.8)
Alkaline Phosphatase: 101 IU/L (ref 39–117)
Bilirubin Total: 0.3 mg/dL (ref 0.0–1.2)
Bilirubin, Direct: 0.1 mg/dL (ref 0.00–0.40)
Total Protein: 6.2 g/dL (ref 6.0–8.5)

## 2017-01-31 LAB — LIPID PANEL
Chol/HDL Ratio: 3.3 ratio units (ref 0.0–4.4)
Cholesterol, Total: 112 mg/dL (ref 100–199)
HDL: 34 mg/dL — ABNORMAL LOW (ref >39)
LDL Calculated: 57 mg/dL (ref 0–99)
Triglycerides: 104 mg/dL (ref 0–149)
VLDL Cholesterol Cal: 21 mg/dL (ref 5–40)

## 2017-02-01 ENCOUNTER — Telehealth: Payer: Self-pay | Admitting: Physician Assistant

## 2017-02-01 NOTE — Telephone Encounter (Signed)
Follow up   Pt calling back for labs

## 2017-02-01 NOTE — Telephone Encounter (Signed)
Patient made aware of results. Patient verbalizes understanding.  

## 2017-02-01 NOTE — Telephone Encounter (Signed)
-----   Message from Liliane Shi, [place] sent at 02/01/2017 12:10 PM EST ----- Please call patient. Kidney function is stable. Lipids are at goal. Liver enzymes (hepatic function panel) are normal. All other parameters are within acceptable limits and no further intervention or testing required. Continue with current treatment plan. Richardson Dopp, PA-C   02/01/2017 12:09 PM

## 2017-02-13 ENCOUNTER — Other Ambulatory Visit: Payer: Self-pay | Admitting: Physician Assistant

## 2017-02-14 DIAGNOSIS — E119 Type 2 diabetes mellitus without complications: Secondary | ICD-10-CM | POA: Diagnosis not present

## 2017-02-14 DIAGNOSIS — I2510 Atherosclerotic heart disease of native coronary artery without angina pectoris: Secondary | ICD-10-CM | POA: Diagnosis not present

## 2017-02-14 DIAGNOSIS — Z87448 Personal history of other diseases of urinary system: Secondary | ICD-10-CM | POA: Diagnosis not present

## 2017-02-14 DIAGNOSIS — I10 Essential (primary) hypertension: Secondary | ICD-10-CM | POA: Diagnosis not present

## 2017-02-14 NOTE — Telephone Encounter (Signed)
atorvastatin (LIPITOR) 40 MG tablet  Medication  Date: 01/18/2017 Department: [address] Office Ordering/Authorizing: Liliane Shi, PA-C  Order Providers   Prescribing Provider Encounter Provider  Liliane Shi, PA-C Liliane Shi, PA-C  Supervision Information   Supervising Provider Type of Supervision  Dorris Carnes V, MD Incident To  Medication Detail    Disp Refills Start End   atorvastatin (LIPITOR) 40 MG tablet 90 tablet 3 01/18/2017    Sig - Route: Take 1 tablet (40 mg total) by mouth daily at 6 PM. - Oral   E-Prescribing Status: Receipt confirmed by pharmacy (01/18/2017 8:44 AM EST)   Pharmacy   [id number] - [place], [place] [address]

## 2017-02-24 DIAGNOSIS — Z794 Long term (current) use of insulin: Secondary | ICD-10-CM | POA: Diagnosis not present

## 2017-02-24 DIAGNOSIS — D649 Anemia, unspecified: Secondary | ICD-10-CM | POA: Diagnosis not present

## 2017-02-24 DIAGNOSIS — E1129 Type 2 diabetes mellitus with other diabetic kidney complication: Secondary | ICD-10-CM | POA: Diagnosis not present

## 2017-02-24 DIAGNOSIS — I129 Hypertensive chronic kidney disease with stage 1 through stage 4 chronic kidney disease, or unspecified chronic kidney disease: Secondary | ICD-10-CM | POA: Diagnosis not present

## 2017-02-24 DIAGNOSIS — I428 Other cardiomyopathies: Secondary | ICD-10-CM | POA: Diagnosis not present

## 2017-02-24 DIAGNOSIS — M19071 Primary osteoarthritis, right ankle and foot: Secondary | ICD-10-CM | POA: Diagnosis not present

## 2017-02-24 DIAGNOSIS — Z6841 Body Mass Index (BMI) 40.0 and over, adult: Secondary | ICD-10-CM | POA: Diagnosis not present

## 2017-02-24 DIAGNOSIS — E119 Type 2 diabetes mellitus without complications: Secondary | ICD-10-CM | POA: Diagnosis not present

## 2017-02-24 DIAGNOSIS — K5730 Diverticulosis of large intestine without perforation or abscess without bleeding: Secondary | ICD-10-CM | POA: Diagnosis not present

## 2017-02-24 DIAGNOSIS — M19072 Primary osteoarthritis, left ankle and foot: Secondary | ICD-10-CM | POA: Diagnosis not present

## 2017-02-24 DIAGNOSIS — N183 Chronic kidney disease, stage 3 (moderate): Secondary | ICD-10-CM | POA: Diagnosis not present

## 2017-03-30 DIAGNOSIS — E119 Type 2 diabetes mellitus without complications: Secondary | ICD-10-CM | POA: Diagnosis not present

## 2017-03-30 DIAGNOSIS — Z794 Long term (current) use of insulin: Secondary | ICD-10-CM | POA: Diagnosis not present

## 2017-03-30 DIAGNOSIS — Z6841 Body Mass Index (BMI) 40.0 and over, adult: Secondary | ICD-10-CM | POA: Diagnosis not present

## 2017-03-30 DIAGNOSIS — N183 Chronic kidney disease, stage 3 (moderate): Secondary | ICD-10-CM | POA: Diagnosis not present

## 2017-03-30 DIAGNOSIS — N2581 Secondary hyperparathyroidism of renal origin: Secondary | ICD-10-CM | POA: Diagnosis not present

## 2017-03-30 DIAGNOSIS — I129 Hypertensive chronic kidney disease with stage 1 through stage 4 chronic kidney disease, or unspecified chronic kidney disease: Secondary | ICD-10-CM | POA: Diagnosis not present

## 2017-03-30 DIAGNOSIS — K5730 Diverticulosis of large intestine without perforation or abscess without bleeding: Secondary | ICD-10-CM | POA: Diagnosis not present

## 2017-03-30 DIAGNOSIS — D649 Anemia, unspecified: Secondary | ICD-10-CM | POA: Diagnosis not present

## 2017-03-30 DIAGNOSIS — I428 Other cardiomyopathies: Secondary | ICD-10-CM | POA: Diagnosis not present

## 2017-03-30 DIAGNOSIS — M19072 Primary osteoarthritis, left ankle and foot: Secondary | ICD-10-CM | POA: Diagnosis not present

## 2017-03-30 DIAGNOSIS — M19071 Primary osteoarthritis, right ankle and foot: Secondary | ICD-10-CM | POA: Diagnosis not present

## 2017-04-14 DIAGNOSIS — Z87448 Personal history of other diseases of urinary system: Secondary | ICD-10-CM | POA: Diagnosis not present

## 2017-04-14 DIAGNOSIS — E119 Type 2 diabetes mellitus without complications: Secondary | ICD-10-CM | POA: Diagnosis not present

## 2017-04-14 DIAGNOSIS — R609 Edema, unspecified: Secondary | ICD-10-CM | POA: Diagnosis not present

## 2017-04-14 DIAGNOSIS — R600 Localized edema: Secondary | ICD-10-CM | POA: Diagnosis not present

## 2017-05-03 DIAGNOSIS — I129 Hypertensive chronic kidney disease with stage 1 through stage 4 chronic kidney disease, or unspecified chronic kidney disease: Secondary | ICD-10-CM | POA: Diagnosis not present

## 2017-05-03 DIAGNOSIS — Z794 Long term (current) use of insulin: Secondary | ICD-10-CM | POA: Diagnosis not present

## 2017-05-03 DIAGNOSIS — D649 Anemia, unspecified: Secondary | ICD-10-CM | POA: Diagnosis not present

## 2017-05-03 DIAGNOSIS — Z6841 Body Mass Index (BMI) 40.0 and over, adult: Secondary | ICD-10-CM | POA: Diagnosis not present

## 2017-05-03 DIAGNOSIS — E119 Type 2 diabetes mellitus without complications: Secondary | ICD-10-CM | POA: Diagnosis not present

## 2017-05-03 DIAGNOSIS — I428 Other cardiomyopathies: Secondary | ICD-10-CM | POA: Diagnosis not present

## 2017-05-03 DIAGNOSIS — K5730 Diverticulosis of large intestine without perforation or abscess without bleeding: Secondary | ICD-10-CM | POA: Diagnosis not present

## 2017-05-03 DIAGNOSIS — N183 Chronic kidney disease, stage 3 (moderate): Secondary | ICD-10-CM | POA: Diagnosis not present

## 2017-05-30 DIAGNOSIS — Z6841 Body Mass Index (BMI) 40.0 and over, adult: Secondary | ICD-10-CM | POA: Diagnosis not present

## 2017-05-30 DIAGNOSIS — D649 Anemia, unspecified: Secondary | ICD-10-CM | POA: Diagnosis not present

## 2017-05-30 DIAGNOSIS — N2581 Secondary hyperparathyroidism of renal origin: Secondary | ICD-10-CM | POA: Diagnosis not present

## 2017-05-30 DIAGNOSIS — I428 Other cardiomyopathies: Secondary | ICD-10-CM | POA: Diagnosis not present

## 2017-05-30 DIAGNOSIS — Z794 Long term (current) use of insulin: Secondary | ICD-10-CM | POA: Diagnosis not present

## 2017-05-30 DIAGNOSIS — I129 Hypertensive chronic kidney disease with stage 1 through stage 4 chronic kidney disease, or unspecified chronic kidney disease: Secondary | ICD-10-CM | POA: Diagnosis not present

## 2017-05-30 DIAGNOSIS — E119 Type 2 diabetes mellitus without complications: Secondary | ICD-10-CM | POA: Diagnosis not present

## 2017-05-30 DIAGNOSIS — N183 Chronic kidney disease, stage 3 (moderate): Secondary | ICD-10-CM | POA: Diagnosis not present

## 2017-06-08 ENCOUNTER — Other Ambulatory Visit: Payer: Self-pay | Admitting: Physician Assistant

## 2017-07-05 ENCOUNTER — Encounter: Payer: Self-pay | Admitting: *Deleted

## 2017-07-05 DIAGNOSIS — I428 Other cardiomyopathies: Secondary | ICD-10-CM | POA: Diagnosis not present

## 2017-07-05 DIAGNOSIS — N2581 Secondary hyperparathyroidism of renal origin: Secondary | ICD-10-CM | POA: Diagnosis not present

## 2017-07-05 DIAGNOSIS — Z6841 Body Mass Index (BMI) 40.0 and over, adult: Secondary | ICD-10-CM | POA: Diagnosis not present

## 2017-07-05 DIAGNOSIS — Z794 Long term (current) use of insulin: Secondary | ICD-10-CM | POA: Diagnosis not present

## 2017-07-05 DIAGNOSIS — D649 Anemia, unspecified: Secondary | ICD-10-CM | POA: Diagnosis not present

## 2017-07-05 DIAGNOSIS — E119 Type 2 diabetes mellitus without complications: Secondary | ICD-10-CM | POA: Diagnosis not present

## 2017-07-05 DIAGNOSIS — I129 Hypertensive chronic kidney disease with stage 1 through stage 4 chronic kidney disease, or unspecified chronic kidney disease: Secondary | ICD-10-CM | POA: Diagnosis not present

## 2017-07-05 DIAGNOSIS — N183 Chronic kidney disease, stage 3 (moderate): Secondary | ICD-10-CM | POA: Diagnosis not present

## 2017-07-18 ENCOUNTER — Other Ambulatory Visit: Payer: Self-pay | Admitting: Physician Assistant

## 2017-07-18 DIAGNOSIS — E785 Hyperlipidemia, unspecified: Secondary | ICD-10-CM

## 2017-07-19 ENCOUNTER — Encounter: Payer: Self-pay | Admitting: Physician Assistant

## 2017-07-19 ENCOUNTER — Ambulatory Visit (INDEPENDENT_AMBULATORY_CARE_PROVIDER_SITE_OTHER): Payer: PPO | Admitting: Physician Assistant

## 2017-07-19 VITALS — BP 118/60 | HR 58 | Ht 62.0 in | Wt 193.0 lb

## 2017-07-19 DIAGNOSIS — I110 Hypertensive heart disease with heart failure: Secondary | ICD-10-CM

## 2017-07-19 DIAGNOSIS — I428 Other cardiomyopathies: Secondary | ICD-10-CM | POA: Diagnosis not present

## 2017-07-19 DIAGNOSIS — N183 Chronic kidney disease, stage 3 unspecified: Secondary | ICD-10-CM

## 2017-07-19 DIAGNOSIS — E785 Hyperlipidemia, unspecified: Secondary | ICD-10-CM | POA: Diagnosis not present

## 2017-07-19 DIAGNOSIS — I5032 Chronic diastolic (congestive) heart failure: Secondary | ICD-10-CM | POA: Diagnosis not present

## 2017-07-19 NOTE — Progress Notes (Signed)
Cardiology Office Note:    Date:  07/19/2017   ID:  [name], DOB 10/09/1956, MRN [id number]  PCP:  [name], MD  Cardiologist:  Dr. Junius Creamer, PA-C  Nephrologist: Dr. Jamal Maes  Referring MD: [name], MD   Chief Complaint  Patient presents with  . Congestive Heart Failure    follow up    History of Present Illness:    [name] is a 61 y.o. female with a hx of a long hx of uncontrolled HTN (poor adherence due to cost and adverse effects), combined systolic and diastolic HF 2/2 to NICM, CKD, tobacco abuse, DM2.  She was admitted 06/2014 with acute combined systolic and diastolic CHF and a/c renal failure (peak Cr 2.15) in the setting of hypertensive urgency with associated elevated in troponin (peak 0.49).   Echo demonstrated reduced LV function with EF 20-25%.  ACEI was not started due to CKD.  Patient noted hx of true allergy to Hydralazine.  She was placed on Coreg and Isosorbide.  Coreg was switched to Metoprolol.  She underwent noninvasive testing with Lexiscan Myoview instead of L heart cath due to CKD.  Myoview demonstrated no scar or ischemia.  Etiology of cardiomyopathy was felt to be non-ischemic and probably related to uncontrolled HTN.  Renal artery [place] 8/15 was neg for RAS.  Follow-up echo in 2/16 demonstrated improved LV function with an EF of 50-55% and moderate diastolic dysfunction.  Last seen in 1/18.    [name] returns for Cardiology follow up.  She is here with her son.  She now sees Dr. [name] for Nephrology.  She increased Ms. Ofallon's Furosemide and added an angiotensin receptor blocker.  She could not tolerate Losartan due to HAs.  She tells me she was on then on Valsartan but this was changed to Telmisartan due to the recall on Valsartan.  She feels better now.  Her HAs are resolved.  She denies chest pain.  She denies significant shortness of breath.  She denies syncope.  She denies paroxysmal nocturnal dyspnea.  She struggles with weight  gain.  Her A1c in 3/18 was < 7.  She is on Lantus and has had issues with weight gain since starting on insulin.    Prior CV studies:   The following studies were reviewed today:  Echo (01/27/15):  Mild LVH, EF 50-55%, normal wall motion, grade 2 diastolic dysfunction  Echo (06/2014): Severe LVH, EF 20-25%, no RWMA, Gr 3 DD, trivial MR, mild LAE, mild RVE, PASP 38 mmHg.  Nuclear (06/2014): EF 32%, apical thinning, no definite scar or ischemia    Past Medical History:  Diagnosis Date  . Chronic combined systolic and diastolic CHF (congestive heart failure) ([place])    a. Echo (06/2014): Severe LVH, EF 20-25%, no RWMA, Gr 3 DD, trivial MR, mild LAE, mild RVE, PASP 38 mmHg .>> b. Echo 2/16 EF 50-55%  . CKD (chronic kidney disease)   . Diabetes mellitus without complication ([place])   . Diverticular disease   . Hypertension   . Hypertensive heart disease    a. Renal Artery Duplex (8/15):  no RAS  . NICM (nonischemic cardiomyopathy) ([place])    a. Nuclear (06/2014): EF 32%, apical thinning, no definite scar or ischemia;  b. Echo (2/16):  Mild LVH, EF 50-55%, Gr 2 DD, no RWMA  . PUD (peptic ulcer disease)   . UTI (lower urinary tract infection)     Past Surgical History:  Procedure Laterality Date  . DILATION AND  CURETTAGE OF UTERUS    . None      Current Medications: Current Meds  Medication Sig  . acetaminophen (TYLENOL) 500 MG tablet Take 1,000 mg by mouth every 6 (six) hours as needed for mild pain, moderate pain or headache. For pain  . amLODipine (NORVASC) 10 MG tablet TAKE 1 TABLET (10 MG TOTAL) BY MOUTH DAILY.  Marland Kitchen amoxicillin-clavulanate (AUGMENTIN) 875-125 MG tablet Take 1 tablet by mouth 2 (two) times daily.  Marland Kitchen aspirin EC 81 MG EC tablet Take 1 tablet (81 mg total) by mouth daily.  Marland Kitchen atorvastatin (LIPITOR) 40 MG tablet Take 1 tablet (40 mg total) by mouth daily at 6 PM.  . B-D ULTRAFINE III SHORT PEN 31G X 8 MM MISC USE AS DIRECTED *EMERGENCY FILL*  . blood glucose meter kit  and supplies KIT Dispense based on patient and insurance preference. Use up to four times daily as directed. (FOR ICD-9 250.00, 250.01).  . cloNIDine (CATAPRES) 0.2 MG tablet TAKE 1 TABLET (0.2 MG TOTAL) BY MOUTH 2 (TWO) TIMES DAILY.  . furosemide (LASIX) 40 MG tablet Take 80 mg by mouth daily.   Marland Kitchen gabapentin (NEURONTIN) 100 MG capsule Take 100 mg by mouth 3 (three) times daily.  . insulin aspart (NOVOLOG FLEXPEN) 100 UNIT/ML FlexPen Inject 5 Units into the skin 3 (three) times daily with meals. Additional insulin (5 units PLUS) CBG 70 - 120: 0 units CBG 121 - 150: 1 unit CBG 151 - 200: 2 units CBG 201 - 250: 3 units CBG 251 - 350: 4 units CBG 351 - 400: 6 units  . Insulin Glargine (LANTUS SOLOSTAR) 100 UNIT/ML Solostar Pen Inject 10 Units into the skin daily at 10 pm.  . isosorbide mononitrate (IMDUR) 120 MG 24 hr tablet Take 1 tablet (120 mg total) by mouth daily.  . metoprolol tartrate (LOPRESSOR) 25 MG tablet Take 1 tablet (25 mg total) by mouth 2 (two) times daily.  . ONE TOUCH ULTRA TEST test strip 1 each by Other route 4 (four) times daily.   Glory Rosebush DELICA LANCETS 25D MISC Inject 33 g into the skin 4 (four) times daily.   Marland Kitchen telmisartan (MICARDIS) 20 MG tablet Take 20 mg by mouth daily.  Marland Kitchen terazosin (HYTRIN) 1 MG capsule Take 1 capsule (1 mg total) by mouth at bedtime.     Allergies:   Hydralazine and Other   Social History   Social History  . Marital status: Married    Spouse name: N/A  . Number of children: 2  . Years of education: 7   Social History Main Topics  . Smoking status: Former Smoker    Quit date: 07/05/2014  . Smokeless tobacco: Never Used  . Alcohol use No  . Drug use: No  . Sexual activity: Not Asked   Other Topics Concern  . None   Social History Narrative   Currently lives in a house with her husband.    Fun: Watch TV.   Denies religious beliefs effecting health care.      Family Hx: The patient's family history includes Coronary artery  disease in her unknown relative; Heart attack in her paternal grandfather; Hypertension in her paternal grandmother and unknown relative; Kidney disease in her mother; Stroke in her paternal grandmother.  ROS:   Please see the history of present illness.    ROS All other systems reviewed and are negative.   EKGs/Labs/Other Test Reviewed:    EKG:  EKG is  ordered today.  The ekg ordered  today demonstrates Sinus bradycardia, HR 58, normal axis, T-wave inversions 1, aVL, V6, QTc 443 ms, no change from prior tracing  Recent Labs: 01/31/2017: ALT 17; BUN 31; Creatinine, Ser 1.60; Potassium 4.8; Sodium 142  Labs from nephrologist 07/05/17: BUN 40, creatinine 1.7, potassium 4.2  Recent Lipid Panel Lab Results  Component Value Date/Time   CHOL 112 01/31/2017 07:51 AM   TRIG 104 01/31/2017 07:51 AM   HDL 34 (L) 01/31/2017 07:51 AM   CHOLHDL 3.3 01/31/2017 07:51 AM   CHOLHDL 3.2 12/07/2015 04:49 AM   LDLCALC 57 01/31/2017 07:51 AM    Physical Exam:    VS:  BP 118/60   Pulse (!) 58   Ht '5\' 2"'$  (1.575 m)   Wt 193 lb (87.5 kg)   BMI 35.30 kg/m     Wt Readings from Last 3 Encounters:  07/19/17 193 lb (87.5 kg)  01/17/17 190 lb 6.4 oz (86.4 kg)  07/20/16 178 lb 12.8 oz (81.1 kg)     Physical Exam  Constitutional: She is oriented to person, place, and time. She appears well-developed and well-nourished. No distress.  HENT:  Head: Normocephalic and atraumatic.  Eyes: No scleral icterus.  Neck: No JVD present.  Cardiovascular: Normal rate, regular rhythm and normal heart sounds.   No murmur heard. Pulmonary/Chest: She has no wheezes. She has no rales.  Abdominal: There is no hepatomegaly.  Musculoskeletal: She exhibits edema (trace ankle edema bilaterally; compression stockings in place).  Neurological: She is alert and oriented to person, place, and time.  Skin: Skin is warm and dry.  Psychiatric: She has a normal mood and affect.    ASSESSMENT:    1. Chronic diastolic CHF  (congestive heart failure) ([place])   2. NICM (nonischemic cardiomyopathy) ([place])   3. Hypertensive heart disease with heart failure ([facility])   4. Stage 3 chronic kidney disease   5. Hyperlipidemia, unspecified hyperlipidemia type    PLAN:    In order of problems listed above:  1. Chronic diastolic CHF (congestive heart failure) ([facility]) Volume overall appears stable. She adjust her furosemide as needed for volume overload.  2. NICM (nonischemic cardiomyopathy) ([place]) EF has improved from 25% to 55% on medical therapy.    3. Hypertensive heart disease with heart failure ([place]) -  Fair control. She shows me several readings today that are optimal. She was just placed on telmisartan about 1 week ago. Blood pressure in the office today is also optimal. Continue to monitor.  4. Stage 3 chronic kidney disease Continue follow-up with Dr. [name].  5. Hyperlipidemia, unspecified hyperlipidemia type LDL optimal on most recent lab work.  Continue current Rx.     Dispo:  Return in about 6 months (around 01/19/2018) for Routine Follow Up, w/ Richardson Dopp, PA-C.   Medication Adjustments/Labs and Tests Ordered: Current medicines are reviewed at length with the patient today.  Concerns regarding medicines are outlined above.  Tests Ordered: Orders Placed This Encounter  Procedures  . EKG 12-Lead   Medication Changes: No orders of the defined types were placed in this encounter.   Signed, Richardson Dopp, PA-C  07/19/2017 4:46 PM    [facility] [place], [place], [place]  [address] Phone: [phone]; Fax: [phone]

## 2017-07-19 NOTE — Patient Instructions (Addendum)
Medication Instructions:  No change.  Continue your current medications.   Labwork: None today.   Testing/Procedures: None   Follow-Up: Richardson Dopp, PA-C in 6 months. Our office will send out a reminder letter in 3-4 months for you to call and make appt.  Any Other Special Instructions Will Be Listed Below (If Applicable). Look for the [place]. The maintenance phase may be a good plan to follow to help with weight loss.  If you need a refill on your cardiac medications before your next appointment, please call your pharmacy.

## 2017-08-08 ENCOUNTER — Telehealth: Payer: Self-pay | Admitting: Physician Assistant

## 2017-08-08 NOTE — Telephone Encounter (Signed)
I returned pt's call from earlier who said she has some paperwork to be filled for her insurance company. I explained to her that the HIM Dept will ask her what type of paperwork she has to be filled out and will advise her as to the process of getting paperwork filled out. I asked if ok to have Barbette Reichmann in our HIM Dept call her. Pt said that would be fine and thanked me for  my call and my help.

## 2017-08-08 NOTE — Telephone Encounter (Signed)
New message    Pt is calling for Arbie Cookey. She said she has some important paper work that she needs filled out for insurance. Please call.

## 2017-08-08 NOTE — Telephone Encounter (Signed)
Called spoke with patient she is aware to bring paperwork to office so I can look at it to determine if Physician will complete it or if it needs to go to our copying service.

## 2017-08-09 ENCOUNTER — Other Ambulatory Visit: Payer: Self-pay | Admitting: Physician Assistant

## 2017-08-09 DIAGNOSIS — I110 Hypertensive heart disease with heart failure: Secondary | ICD-10-CM

## 2017-08-09 NOTE — Telephone Encounter (Signed)
Medication Detail    Disp Refills Start End   terazosin (HYTRIN) 1 MG capsule 90 capsule 3 01/17/2017    Sig - Route: Take 1 capsule (1 mg total) by mouth at bedtime. - Oral   Sent to pharmacy as: terazosin (HYTRIN) 1 MG capsule   E-Prescribing Status: Receipt confirmed by pharmacy (01/17/2017 4:47 PM EST)   Associated Diagnoses   Hypertensive heart disease with heart failure [facility]) - Primary     Pharmacy   [id number] - [place], [place] [address]

## 2017-08-16 DIAGNOSIS — E119 Type 2 diabetes mellitus without complications: Secondary | ICD-10-CM | POA: Diagnosis not present

## 2017-08-16 DIAGNOSIS — I129 Hypertensive chronic kidney disease with stage 1 through stage 4 chronic kidney disease, or unspecified chronic kidney disease: Secondary | ICD-10-CM | POA: Diagnosis not present

## 2017-08-16 DIAGNOSIS — Z6841 Body Mass Index (BMI) 40.0 and over, adult: Secondary | ICD-10-CM | POA: Diagnosis not present

## 2017-08-16 DIAGNOSIS — Z794 Long term (current) use of insulin: Secondary | ICD-10-CM | POA: Diagnosis not present

## 2017-08-16 DIAGNOSIS — I428 Other cardiomyopathies: Secondary | ICD-10-CM | POA: Diagnosis not present

## 2017-08-16 DIAGNOSIS — N183 Chronic kidney disease, stage 3 (moderate): Secondary | ICD-10-CM | POA: Diagnosis not present

## 2017-08-16 DIAGNOSIS — N2581 Secondary hyperparathyroidism of renal origin: Secondary | ICD-10-CM | POA: Diagnosis not present

## 2017-08-16 DIAGNOSIS — D649 Anemia, unspecified: Secondary | ICD-10-CM | POA: Diagnosis not present

## 2017-08-24 ENCOUNTER — Telehealth: Payer: Self-pay | Admitting: Physician Assistant

## 2017-08-24 NOTE — Telephone Encounter (Signed)
Left patient VM to call back [place] ready for pick up.

## 2017-09-12 DIAGNOSIS — R600 Localized edema: Secondary | ICD-10-CM | POA: Diagnosis not present

## 2017-09-12 DIAGNOSIS — E119 Type 2 diabetes mellitus without complications: Secondary | ICD-10-CM | POA: Diagnosis not present

## 2017-09-12 DIAGNOSIS — Z87448 Personal history of other diseases of urinary system: Secondary | ICD-10-CM | POA: Diagnosis not present

## 2017-09-12 DIAGNOSIS — R609 Edema, unspecified: Secondary | ICD-10-CM | POA: Diagnosis not present

## 2017-09-14 DIAGNOSIS — E784 Other hyperlipidemia: Secondary | ICD-10-CM | POA: Diagnosis not present

## 2017-09-14 DIAGNOSIS — I10 Essential (primary) hypertension: Secondary | ICD-10-CM | POA: Diagnosis not present

## 2017-09-14 DIAGNOSIS — E119 Type 2 diabetes mellitus without complications: Secondary | ICD-10-CM | POA: Diagnosis not present

## 2017-09-14 DIAGNOSIS — R5381 Other malaise: Secondary | ICD-10-CM | POA: Diagnosis not present

## 2017-09-19 DIAGNOSIS — R609 Edema, unspecified: Secondary | ICD-10-CM | POA: Diagnosis not present

## 2017-09-19 DIAGNOSIS — E119 Type 2 diabetes mellitus without complications: Secondary | ICD-10-CM | POA: Diagnosis not present

## 2017-09-19 DIAGNOSIS — Z87448 Personal history of other diseases of urinary system: Secondary | ICD-10-CM | POA: Diagnosis not present

## 2017-09-19 DIAGNOSIS — H7419 Adhesive middle ear disease, unspecified ear: Secondary | ICD-10-CM | POA: Diagnosis not present

## 2017-09-22 ENCOUNTER — Other Ambulatory Visit: Payer: Self-pay | Admitting: Physician Assistant

## 2017-09-22 DIAGNOSIS — I110 Hypertensive heart disease with heart failure: Secondary | ICD-10-CM

## 2017-11-24 DIAGNOSIS — R600 Localized edema: Secondary | ICD-10-CM | POA: Diagnosis not present

## 2017-11-24 DIAGNOSIS — E119 Type 2 diabetes mellitus without complications: Secondary | ICD-10-CM | POA: Diagnosis not present

## 2017-11-24 DIAGNOSIS — Z87448 Personal history of other diseases of urinary system: Secondary | ICD-10-CM | POA: Diagnosis not present

## 2017-11-24 DIAGNOSIS — R069 Unspecified abnormalities of breathing: Secondary | ICD-10-CM | POA: Diagnosis not present

## 2018-01-14 ENCOUNTER — Other Ambulatory Visit: Payer: Self-pay | Admitting: Physician Assistant

## 2018-01-26 DIAGNOSIS — B373 Candidiasis of vulva and vagina: Secondary | ICD-10-CM | POA: Diagnosis not present

## 2018-01-26 DIAGNOSIS — I129 Hypertensive chronic kidney disease with stage 1 through stage 4 chronic kidney disease, or unspecified chronic kidney disease: Secondary | ICD-10-CM | POA: Diagnosis not present

## 2018-01-26 DIAGNOSIS — N2581 Secondary hyperparathyroidism of renal origin: Secondary | ICD-10-CM | POA: Diagnosis not present

## 2018-01-26 DIAGNOSIS — N951 Menopausal and female climacteric states: Secondary | ICD-10-CM | POA: Diagnosis not present

## 2018-01-26 DIAGNOSIS — I428 Other cardiomyopathies: Secondary | ICD-10-CM | POA: Diagnosis not present

## 2018-01-26 DIAGNOSIS — D649 Anemia, unspecified: Secondary | ICD-10-CM | POA: Diagnosis not present

## 2018-01-26 DIAGNOSIS — N183 Chronic kidney disease, stage 3 (moderate): Secondary | ICD-10-CM | POA: Diagnosis not present

## 2018-01-26 DIAGNOSIS — Z6841 Body Mass Index (BMI) 40.0 and over, adult: Secondary | ICD-10-CM | POA: Diagnosis not present

## 2018-01-26 DIAGNOSIS — E1122 Type 2 diabetes mellitus with diabetic chronic kidney disease: Secondary | ICD-10-CM | POA: Diagnosis not present

## 2018-01-26 DIAGNOSIS — Z794 Long term (current) use of insulin: Secondary | ICD-10-CM | POA: Diagnosis not present

## 2018-01-31 ENCOUNTER — Ambulatory Visit: Payer: PPO | Admitting: Physician Assistant

## 2018-01-31 ENCOUNTER — Encounter: Payer: Self-pay | Admitting: Physician Assistant

## 2018-01-31 ENCOUNTER — Other Ambulatory Visit: Payer: Self-pay | Admitting: Physician Assistant

## 2018-01-31 VITALS — BP 118/62 | HR 66 | Ht 60.0 in | Wt 204.0 lb

## 2018-01-31 DIAGNOSIS — E785 Hyperlipidemia, unspecified: Secondary | ICD-10-CM

## 2018-01-31 DIAGNOSIS — Z794 Long term (current) use of insulin: Secondary | ICD-10-CM

## 2018-01-31 DIAGNOSIS — I5032 Chronic diastolic (congestive) heart failure: Secondary | ICD-10-CM | POA: Diagnosis not present

## 2018-01-31 DIAGNOSIS — N183 Chronic kidney disease, stage 3 unspecified: Secondary | ICD-10-CM

## 2018-01-31 DIAGNOSIS — I110 Hypertensive heart disease with heart failure: Secondary | ICD-10-CM | POA: Diagnosis not present

## 2018-01-31 DIAGNOSIS — E1122 Type 2 diabetes mellitus with diabetic chronic kidney disease: Secondary | ICD-10-CM | POA: Diagnosis not present

## 2018-01-31 MED ORDER — ISOSORBIDE MONONITRATE ER 120 MG PO TB24: 120.0000 mg | ORAL_TABLET | Freq: Every day | ORAL | 3 refills | Status: DC

## 2018-01-31 MED ORDER — ATORVASTATIN CALCIUM 40 MG PO TABS: 40.0000 mg | ORAL_TABLET | Freq: Every day | ORAL | 3 refills | Status: DC

## 2018-01-31 MED ORDER — AMLODIPINE BESYLATE 10 MG PO TABS: 10.0000 mg | ORAL_TABLET | Freq: Every day | ORAL | 3 refills | Status: DC

## 2018-01-31 MED ORDER — METOPROLOL TARTRATE 25 MG PO TABS: 25.0000 mg | ORAL_TABLET | Freq: Two times a day (BID) | ORAL | 3 refills | Status: DC

## 2018-01-31 MED ORDER — CLONIDINE HCL 0.2 MG PO TABS: 0.2000 mg | ORAL_TABLET | Freq: Two times a day (BID) | ORAL | 3 refills | Status: DC

## 2018-01-31 NOTE — Patient Instructions (Addendum)
Medication Instructions:  Your physician recommends that you continue on your current medications as directed. Please refer to the Current Medication list given to you today.   Labwork: None ordered  We will request a copy of your labs from your primary care doctor.  Testing/Procedures: None ordered  Follow-Up: Your physician wants you to follow-up in: 6 months with Richardson Dopp, PA. You will receive a reminder letter in the mail two months in advance. If you don't receive a letter, please call our office to schedule the follow-up appointment.   Any Other Special Instructions Will Be Listed Below (If Applicable).     If you need a refill on your cardiac medications before your next appointment, please call your pharmacy.

## 2018-01-31 NOTE — Progress Notes (Signed)
Cardiology Office Note:    Date:  01/31/2018   ID:  [name], DOB [birth year], MRN [id number]  PCP:  [name], MD  Cardiologist:  Minus Breeding, MD / Richardson Dopp, PA-C  Nephrologist: Dr. [name]  Referring MD: [name], MD   Chief Complaint  Patient presents with  . Follow-up    Hypertension, Heart Failure    History of Present Illness:    [name] is a 62 y.o. female with a hx of hypertension, combined systolic and diastolic heart failure secondary to nonischemic cardiomyopathy, chronic kidney disease, diabetes.  She was admitted in July 2015 with decompensated heart failure and worsening renal function in the setting of hypertensive urgency.  Echocardiogram demonstrated EF 20-25.  She has a true allergy to hydralazine and has not been able to tolerate carvedilol in the past.  Nuclear stress testing demonstrated no scar or ischemia.  Renal artery ultrasound in August 2015 was negative for renal artery stenosis.  Echocardiogram in 2016 demonstrated improved LV function with an EF of 50-55 and grade 2 diastolic dysfunction.  Last seen July 2018.  [name] returns for follow up.  She is here alone.  She is doing well.  She brings in a list of her BP readings. The morning systolic reading ranges anywhere from 110 up to 140.  Her BP throughout the day is optimal.  She has not had chest pain, syncope.  She has chronic dyspnea on exertion without any change.  Her leg swelling is fairly stable.   Prior CV studies:   The following studies were reviewed today:  Echo (01/27/15):  Mild LVH, EF 50-55%, normal wall motion, grade 2 diastolic dysfunction  Echo (06/2014): Severe LVH, EF 20-25%, no RWMA, Gr 3 DD, trivial MR, mild LAE, mild RVE, PASP 38 mmHg.  Nuclear (06/2014): EF 32%, apical thinning, no definite scar or ischemia  Past Medical History:  Diagnosis Date  . Chronic combined systolic and diastolic CHF (congestive heart failure) ([place])    a. Echo (06/2014): Severe LVH,  EF 20-25%, no RWMA, Gr 3 DD, trivial MR, mild LAE, mild RVE, PASP 38 mmHg .>> b. Echo 2/16 EF 50-55%  . CKD (chronic kidney disease)   . Diabetes mellitus without complication ([place])   . Diverticular disease   . Hypertension   . Hypertensive heart disease    a. Renal Artery Duplex (8/15):  no RAS  . NICM (nonischemic cardiomyopathy) ([place])    a. Nuclear (06/2014): EF 32%, apical thinning, no definite scar or ischemia;  b. Echo (2/16):  Mild LVH, EF 50-55%, Gr 2 DD, no RWMA  . PUD (peptic ulcer disease)   . UTI (lower urinary tract infection)     Past Surgical History:  Procedure Laterality Date  . DILATION AND CURETTAGE OF UTERUS    . None      Current Medications: Current Meds  Medication Sig  . acetaminophen (TYLENOL) 500 MG tablet Take 1,000 mg by mouth every 6 (six) hours as needed for mild pain, moderate pain or headache. For pain  . amLODipine (NORVASC) 10 MG tablet Take 1 tablet (10 mg total) by mouth daily.  Marland Kitchen aspirin EC 81 MG EC tablet Take 1 tablet (81 mg total) by mouth daily.  Marland Kitchen atorvastatin (LIPITOR) 40 MG tablet Take 1 tablet (40 mg total) by mouth daily at 6 PM.  . B-D ULTRAFINE III SHORT PEN 31G X 8 MM MISC USE AS DIRECTED *EMERGENCY FILL*  . blood glucose meter kit and supplies KIT Dispense  based on patient and insurance preference. Use up to four times daily as directed. (FOR ICD-9 250.00, 250.01).  . calcitRIOL (ROCALTROL) 0.25 MCG capsule TAKE 1 CAPSULE 3 TIMES A WEEK  . cloNIDine (CATAPRES) 0.2 MG tablet Take 1 tablet (0.2 mg total) by mouth 2 (two) times daily.  . furosemide (LASIX) 40 MG tablet Take 80 mg by mouth daily.   Marland Kitchen gabapentin (NEURONTIN) 100 MG capsule Take 100 mg by mouth 3 (three) times daily.  . insulin aspart (NOVOLOG FLEXPEN) 100 UNIT/ML FlexPen Inject 5 Units into the skin 3 (three) times daily with meals. Additional insulin (5 units PLUS) CBG 70 - 120: 0 units CBG 121 - 150: 1 unit CBG 151 - 200: 2 units CBG 201 - 250: 3 units CBG 251 - 350:  4 units CBG 351 - 400: 6 units  . Insulin Glargine (LANTUS SOLOSTAR) 100 UNIT/ML Solostar Pen Inject 10 Units into the skin daily at 10 pm.  . isosorbide mononitrate (IMDUR) 120 MG 24 hr tablet Take 1 tablet (120 mg total) by mouth daily.  . metoprolol tartrate (LOPRESSOR) 25 MG tablet Take 1 tablet (25 mg total) by mouth 2 (two) times daily.  . ONE TOUCH ULTRA TEST test strip 1 each by Other route 4 (four) times daily.   [name] DELICA LANCETS 82U MISC Inject 33 g into the skin 4 (four) times daily.   Marland Kitchen PREMARIN vaginal cream APPLY 1 (ONE) APPLICATORFUL 3 DAYS A WEEK  . telmisartan (MICARDIS) 20 MG tablet Take 20 mg by mouth daily.  Marland Kitchen terazosin (HYTRIN) 1 MG capsule Take 1 capsule (1 mg total) by mouth at bedtime.     Allergies:   Hydralazine and Other   Social History   Tobacco Use  . Smoking status: Former Smoker    Last attempt to quit: 07/05/2014    Years since quitting: 3.5  . Smokeless tobacco: Never Used  Substance Use Topics  . Alcohol use: No  . Drug use: No     Family Hx: The patient's family history includes Coronary artery disease in her unknown relative; Heart attack in her paternal grandfather; Hypertension in her paternal grandmother and unknown relative; Kidney disease in her mother; Stroke in her paternal grandmother.  ROS:   Please see the history of present illness.    ROS All other systems reviewed and are negative.   EKGs/Labs/Other Test Reviewed:    EKG:  EKG is not ordered today.    Recent Labs: No results found for requested labs within last 8760 hours.   Recent Lipid Panel Lab Results  Component Value Date/Time   CHOL 112 01/31/2017 07:51 AM   TRIG 104 01/31/2017 07:51 AM   HDL 34 (L) 01/31/2017 07:51 AM   CHOLHDL 3.3 01/31/2017 07:51 AM   CHOLHDL 3.2 12/07/2015 04:49 AM   LDLCALC 57 01/31/2017 07:51 AM    Physical Exam:    VS:  BP 118/62   Pulse 66   Ht 5' (1.524 m)   Wt 204 lb (92.5 kg)   SpO2 96%   BMI 39.84 kg/m     Wt  Readings from Last 3 Encounters:  01/31/18 204 lb (92.5 kg)  07/19/17 193 lb (87.5 kg)  01/17/17 190 lb 6.4 oz (86.4 kg)     Physical Exam  Constitutional: She is oriented to person, place, and time. She appears well-developed and well-nourished. No distress.  HENT:  Head: Normocephalic and atraumatic.  Neck: No JVD present.  Cardiovascular: Normal rate and regular rhythm.  No murmur heard. Pulmonary/Chest: Effort normal. She has no rales.  Abdominal: Soft.  Musculoskeletal: She exhibits edema (trace edema; compression stockings in place).  Neurological: She is alert and oriented to person, place, and time.  Skin: Skin is warm and dry.    ASSESSMENT & PLAN:    1.  Chronic diastolic CHF (congestive heart failure) (HCC) NYHA 2 b.  Volume remains stable.  Continue current medical regimen.  2.  Hypertensive heart disease with heart failure (HCC) Blood pressure in the morning varies.  This is before she takes her morning medications.  Overall, her blood pressure is optimal.  Continue to monitor.  Continue current regimen.  3.  CKD (chronic kidney disease) stage 3, GFR 30-59 ml/min (HCC) Continue follow-up with nephrology.  4.  Hyperlipidemia  Continue statin.  Obtain recent lipid panel from primary care.  5.  Type 2 diabetes mellitus Recent hemoglobin A1c 6.4.  Continue follow-up with primary care.     Dispo:  Return in about 6 months (around 07/31/2018) for Routine Follow Up, w/ Richardson Dopp, PA-C.   Medication Adjustments/Labs and Tests Ordered: Current medicines are reviewed at length with the patient today.  Concerns regarding medicines are outlined above.  Tests Ordered: No orders of the defined types were placed in this encounter.  Medication Changes: Meds ordered this encounter  Medications  . amLODipine (NORVASC) 10 MG tablet    Sig: Take 1 tablet (10 mg total) by mouth daily.    Dispense:  90 tablet    Refill:  3    Order Specific Question:   Supervising  Provider    Answer:   Acie Fredrickson, PHILIP J [[id number]]  . cloNIDine (CATAPRES) 0.2 MG tablet    Sig: Take 1 tablet (0.2 mg total) by mouth 2 (two) times daily.    Dispense:  180 tablet    Refill:  3    Order Specific Question:   Supervising Provider    Answer:   Acie Fredrickson, PHILIP J [[id number]]  . isosorbide mononitrate (IMDUR) 120 MG 24 hr tablet    Sig: Take 1 tablet (120 mg total) by mouth daily.    Dispense:  90 tablet    Refill:  3    Order Specific Question:   Supervising Provider    Answer:   Acie Fredrickson, PHILIP J [[id number]]  . metoprolol tartrate (LOPRESSOR) 25 MG tablet    Sig: Take 1 tablet (25 mg total) by mouth 2 (two) times daily.    Dispense:  180 tablet    Refill:  3    Order Specific Question:   Supervising Provider    Answer:   Acie Fredrickson, PHILIP J [[id number]]  . atorvastatin (LIPITOR) 40 MG tablet    Sig: Take 1 tablet (40 mg total) by mouth daily at 6 PM.    Dispense:  90 tablet    Refill:  3    Order Specific Question:   Supervising Provider    Answer:   Thayer Headings [[id number]]    Signed, Richardson Dopp, PA-C  01/31/2018 1:21 PM    [facility] [place], [place], [place]  [address] Phone: [phone]; Fax: [phone]

## 2018-02-01 ENCOUNTER — Other Ambulatory Visit: Payer: Self-pay | Admitting: Physician Assistant

## 2018-02-01 DIAGNOSIS — I110 Hypertensive heart disease with heart failure: Secondary | ICD-10-CM

## 2018-03-07 DIAGNOSIS — E119 Type 2 diabetes mellitus without complications: Secondary | ICD-10-CM | POA: Diagnosis not present

## 2018-03-07 DIAGNOSIS — N898 Other specified noninflammatory disorders of vagina: Secondary | ICD-10-CM | POA: Diagnosis not present

## 2018-03-07 DIAGNOSIS — Z87448 Personal history of other diseases of urinary system: Secondary | ICD-10-CM | POA: Diagnosis not present

## 2018-03-07 DIAGNOSIS — R609 Edema, unspecified: Secondary | ICD-10-CM | POA: Diagnosis not present

## 2018-04-19 DIAGNOSIS — Z794 Long term (current) use of insulin: Secondary | ICD-10-CM | POA: Diagnosis not present

## 2018-04-19 DIAGNOSIS — I428 Other cardiomyopathies: Secondary | ICD-10-CM | POA: Diagnosis not present

## 2018-04-19 DIAGNOSIS — N2581 Secondary hyperparathyroidism of renal origin: Secondary | ICD-10-CM | POA: Diagnosis not present

## 2018-04-19 DIAGNOSIS — N183 Chronic kidney disease, stage 3 (moderate): Secondary | ICD-10-CM | POA: Diagnosis not present

## 2018-04-19 DIAGNOSIS — E1122 Type 2 diabetes mellitus with diabetic chronic kidney disease: Secondary | ICD-10-CM | POA: Diagnosis not present

## 2018-04-19 DIAGNOSIS — D649 Anemia, unspecified: Secondary | ICD-10-CM | POA: Diagnosis not present

## 2018-04-19 DIAGNOSIS — I129 Hypertensive chronic kidney disease with stage 1 through stage 4 chronic kidney disease, or unspecified chronic kidney disease: Secondary | ICD-10-CM | POA: Diagnosis not present

## 2018-04-19 DIAGNOSIS — Z6841 Body Mass Index (BMI) 40.0 and over, adult: Secondary | ICD-10-CM | POA: Diagnosis not present

## 2018-07-13 ENCOUNTER — Telehealth: Payer: Self-pay | Admitting: Physician Assistant

## 2018-07-13 NOTE — Telephone Encounter (Signed)
Walk In pt Form-Sealed Envelope dropped off. Placed in [employer] PA-C doc box.

## 2018-07-21 ENCOUNTER — Telehealth: Payer: Self-pay | Admitting: Physician Assistant

## 2018-07-21 NOTE — Telephone Encounter (Signed)
Scott signed & Completed [employer] paperwork. Called patient she is aware ready for pickup.

## 2018-07-24 DIAGNOSIS — N2581 Secondary hyperparathyroidism of renal origin: Secondary | ICD-10-CM | POA: Diagnosis not present

## 2018-07-24 DIAGNOSIS — I129 Hypertensive chronic kidney disease with stage 1 through stage 4 chronic kidney disease, or unspecified chronic kidney disease: Secondary | ICD-10-CM | POA: Diagnosis not present

## 2018-07-24 DIAGNOSIS — Z794 Long term (current) use of insulin: Secondary | ICD-10-CM | POA: Diagnosis not present

## 2018-07-24 DIAGNOSIS — E1122 Type 2 diabetes mellitus with diabetic chronic kidney disease: Secondary | ICD-10-CM | POA: Diagnosis not present

## 2018-07-24 DIAGNOSIS — D649 Anemia, unspecified: Secondary | ICD-10-CM | POA: Diagnosis not present

## 2018-07-24 DIAGNOSIS — I428 Other cardiomyopathies: Secondary | ICD-10-CM | POA: Diagnosis not present

## 2018-07-24 DIAGNOSIS — N183 Chronic kidney disease, stage 3 (moderate): Secondary | ICD-10-CM | POA: Diagnosis not present

## 2018-08-30 ENCOUNTER — Ambulatory Visit: Payer: PPO | Admitting: Physician Assistant

## 2018-08-30 ENCOUNTER — Encounter: Payer: Self-pay | Admitting: Physician Assistant

## 2018-08-30 VITALS — BP 98/68 | HR 60 | Ht 60.0 in | Wt 203.5 lb

## 2018-08-30 DIAGNOSIS — Z794 Long term (current) use of insulin: Secondary | ICD-10-CM | POA: Diagnosis not present

## 2018-08-30 DIAGNOSIS — E1122 Type 2 diabetes mellitus with diabetic chronic kidney disease: Secondary | ICD-10-CM | POA: Diagnosis not present

## 2018-08-30 DIAGNOSIS — I110 Hypertensive heart disease with heart failure: Secondary | ICD-10-CM

## 2018-08-30 DIAGNOSIS — N183 Chronic kidney disease, stage 3 unspecified: Secondary | ICD-10-CM

## 2018-08-30 DIAGNOSIS — I5032 Chronic diastolic (congestive) heart failure: Secondary | ICD-10-CM | POA: Diagnosis not present

## 2018-08-30 MED ORDER — TELMISARTAN 20 MG PO TABS: 20.0000 mg | ORAL_TABLET | Freq: Every day | ORAL | 3 refills | Status: DC

## 2018-08-30 MED ORDER — METOPROLOL TARTRATE 25 MG PO TABS: 25.0000 mg | ORAL_TABLET | Freq: Two times a day (BID) | ORAL | 3 refills | Status: DC

## 2018-08-30 MED ORDER — ISOSORBIDE MONONITRATE ER 120 MG PO TB24: 120.0000 mg | ORAL_TABLET | Freq: Every day | ORAL | 3 refills | Status: DC

## 2018-08-30 MED ORDER — AMLODIPINE BESYLATE 10 MG PO TABS: 10.0000 mg | ORAL_TABLET | Freq: Every day | ORAL | 3 refills | Status: DC

## 2018-08-30 MED ORDER — ATORVASTATIN CALCIUM 40 MG PO TABS: 40.0000 mg | ORAL_TABLET | Freq: Every day | ORAL | 3 refills | Status: DC

## 2018-08-30 NOTE — Patient Instructions (Signed)
Medication Instructions:  REFILLS HAVE BEEN SENT IN FOR YOUR CARDIAC MEDICATIONS   Labwork: NONE ORDERED TODAY  Testing/Procedures: NONE ORDERED TODAY  Follow-Up: 6 MONTHS WITH SCOTT WEAVER, PAC   Any Other Special Instructions Will Be Listed Below (If Applicable).     If you need a refill on your cardiac medications before your next appointment, please call your pharmacy.

## 2018-08-30 NOTE — Progress Notes (Signed)
Cardiology Office Note:    Date:  08/30/2018   ID:  [name], DOB 27-Aug-1956, MRN [id number]  PCP:  Susan Athens, MD  Cardiologist:  Susan Breeding, MD / Susan Dopp, PA-C  Electrophysiologist:  None  Nephrologist:  Dr. Lorrene Knapp  Referring MD: Susan Athens, MD   Chief Complaint  Patient presents with  . Follow-up    CHF, HTN    History of Present Illness:    [name] is a 62 y.o. female with hypertension, combined systolic and diastolic heart failure secondary to nonischemic cardiomyopathy, chronic kidney disease, diabetes.  She was admitted in July 2015 with decompensated heart failure and worsening renal function in the setting of hypertensive urgency.  Echocardiogram demonstrated EF 20-25.  She has a true allergy to hydralazine and has not been able to tolerate carvedilol in the past.  Nuclear stress testing demonstrated no scar or ischemia.  Renal artery ultrasound in August 2015 was negative for renal artery stenosis.  Echocardiogram in 2016 demonstrated improved LV function with an EF of 50-55 and grade 2 diastolic dysfunction.   [name] returns for follow up.  She is here with her son.  Since last seen, she has not had any chest pain, syncope, paroxysmal nocturnal dyspnea.  She has chronic leg swelling and dyspnea on exertion.  She takes Lasix 80 mg Once daily.  She takes a rare extra dose of Lasix for worsening swelling or shortness of breath.  She saw Dr. Lorrene Knapp last month.  Her creatinine has remained stable.  She has noted some low BP readings at times.  She has not had dizziness or near syncope.    Prior CV studies:   The following studies were reviewed today:  Echo (01/27/15):  Mild LVH, EF 50-55%, normal wall motion, grade 2 diastolic dysfunction   Echo (06/2014):   Severe LVH, EF 20-25%, no RWMA, Gr 3 DD, trivial MR, mild LAE, mild RVE, PASP 38 mmHg.   Nuclear (06/2014):   EF 32%, apical thinning, no definite scar or ischemia   Past Medical History:  Diagnosis  Date  . Arthritis of both feet   . BMI 40.0-44.9, adult ([place])   . Chronic combined systolic and diastolic CHF (congestive heart failure) ([place])    a. Echo (06/2014): Severe LVH, EF 20-25%, no RWMA, Gr 3 DD, trivial MR, mild LAE, mild RVE, PASP 38 mmHg .>> b. Echo 2/16 EF 50-55%  . CKD (chronic kidney disease)   . Diabetes mellitus without complication ([place])   . Diverticular disease   . Fatty liver   . History of GI diverticular bleed   . Hyperlipidemia   . Hyperphosphatemia   . Hypertension   . Hypertensive heart disease    a. Renal Artery Duplex (8/15):  no RAS  . Iron deficiency anemia   . NICM (nonischemic cardiomyopathy) ([place])    a. Nuclear (06/2014): EF 32%, apical thinning, no definite scar or ischemia;  b. Echo (2/16):  Mild LVH, EF 50-55%, Gr 2 DD, no RWMA  . Proteinuria   . PUD (peptic ulcer disease)   . Secondary hyperparathyroidism, renal ([place])   . Tobacco abuse   . UTI (lower urinary tract infection)   . Vaginal dryness, menopausal   . Vaginal yeast infection    Surgical Hx: The patient  has a past surgical history that includes None and Dilation and curettage of uterus.   Current Medications: Current Meds  Medication Sig  . acetaminophen (TYLENOL) 500 MG tablet Take 1,000 mg by mouth  every 6 (six) hours as needed for mild pain, moderate pain or headache. For pain  . amLODipine (NORVASC) 10 MG tablet Take 1 tablet (10 mg total) by mouth daily.  Marland Kitchen aspirin EC 81 MG EC tablet Take 1 tablet (81 mg total) by mouth daily.  Marland Kitchen atorvastatin (LIPITOR) 40 MG tablet Take 1 tablet (40 mg total) by mouth daily at 6 PM.  . B-D ULTRAFINE III SHORT PEN 31G X 8 MM MISC USE AS DIRECTED *EMERGENCY FILL*  . blood glucose meter kit and supplies KIT Dispense based on patient and insurance preference. Use up to four times daily as directed. (FOR ICD-9 250.00, 250.01).  . calcitRIOL (ROCALTROL) 0.25 MCG capsule TAKE 1 CAPSULE 3 TIMES A WEEK  . cloNIDine (CATAPRES) 0.2 MG tablet Take 1 tablet  (0.2 mg total) by mouth 2 (two) times daily.  . furosemide (LASIX) 40 MG tablet Take 80 mg by mouth daily.   Marland Kitchen gabapentin (NEURONTIN) 100 MG capsule Take 100 mg by mouth 3 (three) times daily.  . insulin aspart (NOVOLOG FLEXPEN) 100 UNIT/ML FlexPen Inject 5 Units into the skin 3 (three) times daily with meals. Additional insulin (5 units PLUS) CBG 70 - 120: 0 units CBG 121 - 150: 1 unit CBG 151 - 200: 2 units CBG 201 - 250: 3 units CBG 251 - 350: 4 units CBG 351 - 400: 6 units  . Insulin Glargine (LANTUS SOLOSTAR) 100 UNIT/ML Solostar Pen Inject 10 Units into the skin daily at 10 pm.  . isosorbide mononitrate (IMDUR) 120 MG 24 hr tablet Take 1 tablet (120 mg total) by mouth daily.  . metoprolol tartrate (LOPRESSOR) 25 MG tablet Take 1 tablet (25 mg total) by mouth 2 (two) times daily.  . ONE TOUCH ULTRA TEST test strip 1 each by Other route 4 (four) times daily.   Glory Rosebush DELICA LANCETS 14E MISC Inject 33 g into the skin 4 (four) times daily.   Marland Kitchen telmisartan (MICARDIS) 20 MG tablet Take 1 tablet (20 mg total) by mouth daily.  Marland Kitchen terazosin (HYTRIN) 1 MG capsule TAKE 1 CAPSULE (1 MG TOTAL) BY MOUTH AT BEDTIME.  . [DISCONTINUED] amLODipine (NORVASC) 10 MG tablet Take 1 tablet (10 mg total) by mouth daily.  . [DISCONTINUED] atorvastatin (LIPITOR) 40 MG tablet TAKE 1 TABLET (40 MG TOTAL) BY MOUTH DAILY AT 6 PM.  . [DISCONTINUED] isosorbide mononitrate (IMDUR) 120 MG 24 hr tablet Take 1 tablet (120 mg total) by mouth daily.  . [DISCONTINUED] metoprolol tartrate (LOPRESSOR) 25 MG tablet Take 1 tablet (25 mg total) by mouth 2 (two) times daily.  . [DISCONTINUED] telmisartan (MICARDIS) 20 MG tablet Take 20 mg by mouth daily.     Allergies:   Hydralazine and Other   Social History   Tobacco Use  . Smoking status: Former Smoker    Last attempt to quit: 07/05/2014    Years since quitting: 4.1  . Smokeless tobacco: Never Used  Substance Use Topics  . Alcohol use: No  . Drug use: No      Family Hx: The patient's family history includes Coronary artery disease in her unknown relative; Diabetes in her mother; Heart attack in her paternal grandfather; Hypertension in her paternal grandmother and unknown relative; Kidney disease in her mother; Prostate cancer in her father; Stroke in her paternal grandmother.  ROS:   Please see the history of present illness.    Review of Systems  Cardiovascular: Positive for leg swelling.  Respiratory: Positive for shortness of breath.  All other systems reviewed and are negative.   EKGs/Labs/Other Test Reviewed:    EKG:  EKG is  ordered today.  The ekg ordered today demonstrates sinus brady, HR 59, TW inversions 1, aVL, septal Q waves, QTc 459 ms, no significant changes  Recent Labs: No results found for requested labs within last 8760 hours.   Recent Lipid Panel Lab Results  Component Value Date/Time   CHOL 112 01/31/2017 07:51 AM   TRIG 104 01/31/2017 07:51 AM   HDL 34 (L) 01/31/2017 07:51 AM   CHOLHDL 3.3 01/31/2017 07:51 AM   CHOLHDL 3.2 12/07/2015 04:49 AM   LDLCALC 57 01/31/2017 07:51 AM    Physical Exam:    VS:  BP 98/68   Pulse 60   Ht 5' (1.524 m)   Wt 203 lb 8 oz (92.3 kg)   SpO2 95%   BMI 39.74 kg/m     Wt Readings from Last 3 Encounters:  08/30/18 203 lb 8 oz (92.3 kg)  01/31/18 204 lb (92.5 kg)  07/19/17 193 lb (87.5 kg)     Physical Exam  Constitutional: She is oriented to person, place, and time. She appears well-developed and well-nourished. No distress.  HENT:  Head: Normocephalic and atraumatic.  Eyes: No scleral icterus.  Neck: No JVD present. No thyromegaly present.  Cardiovascular: Normal rate and regular rhythm.  No murmur heard. Pulmonary/Chest: Effort normal. She has no rales.  Abdominal: Soft. She exhibits no distension.  Musculoskeletal: She exhibits edema (trace-1+ bilat LE edema).  Lymphadenopathy:    She has no cervical adenopathy.  Neurological: She is alert and oriented to  person, place, and time.  Skin: Skin is warm and dry.  Psychiatric: She has a normal mood and affect.    ASSESSMENT & PLAN:    Chronic diastolic CHF (congestive heart failure) (HCC) NYHA 2.  Volume remains stable.  Continue current medical regimen.  Hypertensive heart disease with heart failure ([place])  Her blood pressure has been optimal.  She has had some lower blood pressures.  If she has symptomatic low blood pressure, consider decreasing amlodipine or isosorbide.  Otherwise, continue current medical regimen which includes amlodipine, clonidine, metoprolol, isosorbide, telmisartan and terazosin.  CKD (chronic kidney disease) stage 3, GFR 30-59 ml/min (HCC) Continue follow up with Nephrology.  Creatinine in 07/2018 was 1.82.  Type 2 diabetes mellitus with stage 3 chronic kidney disease, with long-term current use of insulin ([place]) She has had issues with weight gain since she was dx with diabetes.  We discussed limiting sugar as much as possible and to watch for added sugars in foods.   Dispo:  Return in about 6 months (around 02/28/2019) for Routine Follow Up, w/ Susan Dopp, PA-C.   Medication Adjustments/Labs and Tests Ordered: Current medicines are reviewed at length with the patient today.  Concerns regarding medicines are outlined above.  Tests Ordered: Orders Placed This Encounter  Procedures  . EKG 12-Lead   Medication Changes: Meds ordered this encounter  Medications  . telmisartan (MICARDIS) 20 MG tablet    Sig: Take 1 tablet (20 mg total) by mouth daily.    Dispense:  90 tablet    Refill:  3  . metoprolol tartrate (LOPRESSOR) 25 MG tablet    Sig: Take 1 tablet (25 mg total) by mouth 2 (two) times daily.    Dispense:  180 tablet    Refill:  3  . isosorbide mononitrate (IMDUR) 120 MG 24 hr tablet    Sig: Take 1 tablet (120  mg total) by mouth daily.    Dispense:  90 tablet    Refill:  3  . atorvastatin (LIPITOR) 40 MG tablet    Sig: Take 1 tablet (40 mg total) by  mouth daily at 6 PM.    Dispense:  90 tablet    Refill:  3  . amLODipine (NORVASC) 10 MG tablet    Sig: Take 1 tablet (10 mg total) by mouth daily.    Dispense:  90 tablet    Refill:  3    Signed, Susan Dopp, PA-C  08/30/2018 4:15 PM    [facility] [place], [place], [place]  [address] Phone: [phone]; Fax: [phone]

## 2018-09-08 ENCOUNTER — Telehealth: Payer: Self-pay | Admitting: Physician Assistant

## 2018-09-08 NOTE — Telephone Encounter (Signed)
I returned pt's call who called with some BP issues. Pt states when she wakes up her BP is ok usually around 127/70, 134/70. Pt states problem is more in the afternoon. Pt gave me some readings from yesterday. See readings: 6 pm 9/19: 98/52 8:30 pm 93/53 9 pm 106/53 10:30 108/57  Pt does state she had some dizziness and feeling off balance and nausea. Asked pt if she had any chest pain, sob. Pt states some very mild chest pain on a scale of 1-10 and 10 being the worst, pt states pain was a 1 and this last about 3 hours. Pt denies any sob. Pt states she did not take her medications last night because of BP being low and this morning BP was 140/70. Advised pt I will d/w Richardson Dopp, PA and call he back later. Pt thanked me for the call.

## 2018-09-08 NOTE — Telephone Encounter (Signed)
New Message   Pt c/o BP issue:  1. What are your last 5 BP readings? 98/52 (patient states this was the BP yesterday and it contiuned to decrease. The lowest was 53/93 2. Are you having any other symptoms (ex. Dizziness, headache, blurred vision, passed out)? Dizziness, nausea and feeling off balance 3. What is your medication issue? None

## 2018-09-08 NOTE — Telephone Encounter (Signed)
Decrease Isosorbide MN to 60 mg QD Monitor blood pressure over 2 weeks and send readings. Richardson Dopp, PA-C    09/08/2018 2:31 PM

## 2018-09-11 MED ORDER — ISOSORBIDE MONONITRATE ER 120 MG PO TB24: ORAL_TABLET | ORAL | Status: DC

## 2018-09-11 NOTE — Telephone Encounter (Signed)
Pt has been advised to decrease Imdur to 60 mg daily and monitor BP x 2 weeks and call the office with readings. Went over on instructions on how to take BP. Advised take medication when she wakes up, wait 1 1/2-2 hours sit down for about 10-15 minutes and then take BP. Advised pt need to have time for medications to work in the AM since she seems to be running a little higher in the AM. Pt verbalized understanding. Pt thanked me for the call.

## 2018-10-05 ENCOUNTER — Telehealth: Payer: Self-pay | Admitting: Physician Assistant

## 2018-10-05 MED ORDER — CLONIDINE HCL 0.1 MG PO TABS: 0.1000 mg | ORAL_TABLET | Freq: Two times a day (BID) | ORAL | 3 refills | Status: DC

## 2018-10-05 NOTE — Telephone Encounter (Signed)
Decrease Clonidine to 0.1 mg Twice daily. Arrange follow up in the next 1-2 weeks. Call for sooner follow up or go to the ED if symptoms worsen. Richardson Dopp, PA-C    10/05/2018 4:14 PM

## 2018-10-05 NOTE — Addendum Note (Signed)
Addended by: Sarina Ill on: 10/05/2018 04:53 PM   Modules accepted: Orders

## 2018-10-05 NOTE — Telephone Encounter (Signed)
Spoke with the patient and she accepted the medication change and scheduled a 2 week follow up with Melina Copa, PA-C on 10/29, due to Scott's schedule being full.

## 2018-10-05 NOTE — Telephone Encounter (Signed)
Spoke with the patient, she has been having instances of low blood pressures, she has been around 700F systolic, 25/91. She has been feeling worse in the past 3 weeks, when she has the events, she experiences dizziness, nausea and needs to sit down due to being afraid of falling. She said the most recent medication change was Imdur from 120 to 60.   Sending to [employer], PA-C

## 2018-10-05 NOTE — Telephone Encounter (Signed)
New Message:   Patient has some concerns about some medication. Please call back

## 2018-10-13 ENCOUNTER — Encounter: Payer: Self-pay | Admitting: Physician Assistant

## 2018-10-13 NOTE — Progress Notes (Addendum)
Cardiology Office Note    Date:  10/17/2018  ID:  Shernell Saldierna, DOB [birth year], MRN [id number] PCP:  [name], MD  Cardiologist:  Minus Breeding, MD   Chief Complaint: f/u low blood pressure  History of Present Illness:  [name] is a 62 y.o. female with history of hypertension, chronic combined systolic and diastolic heart failure secondary to nonischemic cardiomyopathy, chronic kidney disease stage III (follows with Dr. [name]), anemia, diverticular GIB, diabetes, obesity. She was initially admitted in July 2015 with decompensated heart failure and worsening renal function in the setting of hypertensive urgency. Echocardiogram demonstrated EF 20-25%. Nuclear stress test that admision demonstrated no scar or ischemia. She has a true allergy to hydralazine and has not been able to tolerate carvedilol in the past. Renal artery ultrasound in August 2015 was negative for renal artery stenosis. Last echocardiogram in 2016 demonstrated improved LV function with an EF of 50-55 and grade 2 diastolic dysfunction, otherwise no significant abnormalities. She has chronic leg swelling and dyspnea on exertion. Last labs 08/2017 with TSH wnl, Hgb 12.6, plt 207, BUN 44, Cr 1.77, K 4.2, Na 140, albumin 4.1, AST/ALT wnl, trig 311, HDL 29, LDL 68; Scott Weaver references Cr 1.82 in 07/2018. She was last seen 08/2018 by Richardson Dopp and felt to be doing well. Intermittent low BP was noted. Per phone notes in Sept & Oct 2019, Imdur was decreased from '120mg'$  to 60 and clonidine to 0.'1mg'$  BID.  She returns for follow-up. She reduced her Imdur 5 weeks ago and clonidine 1.5 weeks ago. She brings in a log of BP which are relatively controlled but occasionally still dipping in the 99 systolic or low 244 range. Her dizziness has improved with these changes but not resolved. No syncope or chest pain. She reports chronic DOE, orthopnea, LEE (chronically L>R) which she feels is similar to baseline, some days worse than others. She  reports her diabetes keeps her hungry and thirsty and some days she goes over her recommended fluid intake. She is frustrated because she has noticed degenerative joint changes in her ankles and legs, which is causing pain with mobility. In turn she's gained a significant amount of weight over the years due to inability to be active. She has not seen orthopedics for this issue. PCP tried gabapentin but it's not really helping. She didn't tolerate higher doses due to headaches. Denies any known bleeding.   Past Medical History:  Diagnosis Date  . Arthritis of both feet   . Chronic combined systolic and diastolic CHF (congestive heart failure) ([place])    a. Echo (06/2014): Severe LVH, EF 20-25%, no RWMA, Gr 3 DD, trivial MR, mild LAE, mild RVE, PASP 38 mmHg .>> b. Echo 2/16 EF 50-55%  . CKD (chronic kidney disease), stage III ([place])   . Diabetes mellitus type 2 in obese ([place])   . Diverticular disease   . Fatty liver   . History of GI diverticular bleed   . Hyperlipidemia   . Hyperphosphatemia   . Hypertension   . Hypertensive heart disease    a. Renal Artery Duplex (8/15):  no RAS  . Iron deficiency anemia   . NICM (nonischemic cardiomyopathy) ([place])    a. Nuclear (06/2014): EF 32%, apical thinning, no definite scar or ischemia;  b. Echo (2/16):  Mild LVH, EF 50-55%, Gr 2 DD, no RWMA  . Obesity   . Proteinuria   . PUD (peptic ulcer disease)   . Secondary hyperparathyroidism, renal ([place])   .  Tobacco abuse   . UTI (lower urinary tract infection)   . Vaginal dryness, menopausal   . Vaginal yeast infection     Past Surgical History:  Procedure Laterality Date  . DILATION AND CURETTAGE OF UTERUS    . None      Current Medications: Current Meds  Medication Sig  . acetaminophen (TYLENOL) 500 MG tablet Take 1,000 mg by mouth every 6 (six) hours as needed for mild pain, moderate pain or headache. For pain  . amLODipine (NORVASC) 10 MG tablet Take 1 tablet (10 mg total) by mouth daily.  Marland Kitchen  aspirin EC 81 MG EC tablet Take 1 tablet (81 mg total) by mouth daily.  Marland Kitchen atorvastatin (LIPITOR) 40 MG tablet Take 1 tablet (40 mg total) by mouth daily at 6 PM.  . B-D ULTRAFINE III SHORT PEN 31G X 8 MM MISC USE AS DIRECTED *EMERGENCY FILL*  . blood glucose meter kit and supplies KIT Dispense based on patient and insurance preference. Use up to four times daily as directed. (FOR ICD-9 250.00, 250.01).  . calcitRIOL (ROCALTROL) 0.25 MCG capsule TAKE 1 CAPSULE 3 TIMES A WEEK  . cloNIDine (CATAPRES) 0.1 MG tablet Take 1 tablet (0.1 mg total) by mouth 2 (two) times daily.  . furosemide (LASIX) 40 MG tablet Take 80 mg by mouth daily.   Marland Kitchen gabapentin (NEURONTIN) 100 MG capsule Take 100 mg by mouth 3 (three) times daily.  . insulin aspart (NOVOLOG FLEXPEN) 100 UNIT/ML FlexPen Inject 5 Units into the skin 3 (three) times daily with meals. Additional insulin (5 units PLUS) CBG 70 - 120: 0 units CBG 121 - 150: 1 unit CBG 151 - 200: 2 units CBG 201 - 250: 3 units CBG 251 - 350: 4 units CBG 351 - 400: 6 units  . Insulin Glargine (LANTUS SOLOSTAR) 100 UNIT/ML Solostar Pen Inject 10 Units into the skin daily at 10 pm.  . isosorbide mononitrate (IMDUR) 120 MG 24 hr tablet TAKE 1/2 TABLET DAILY = 60 MG DAILY  . metoprolol tartrate (LOPRESSOR) 25 MG tablet Take 1 tablet (25 mg total) by mouth 2 (two) times daily.  . ONE TOUCH ULTRA TEST test strip 1 each by Other route 4 (four) times daily.   Glory Rosebush DELICA LANCETS 48N MISC Inject 33 g into the skin 4 (four) times daily.   Marland Kitchen telmisartan (MICARDIS) 20 MG tablet Take 1 tablet (20 mg total) by mouth daily.  Marland Kitchen terazosin (HYTRIN) 1 MG capsule TAKE 1 CAPSULE (1 MG TOTAL) BY MOUTH AT BEDTIME.      Allergies:   Hydralazine and Other   Social History   Socioeconomic History  . Marital status: Married    Spouse name: Not on file  . Number of children: 2  . Years of education: 7  . Highest education level: Not on file  Occupational History  . Not on file    Social Needs  . Financial resource strain: Not on file  . Food insecurity:    Worry: Not on file    Inability: Not on file  . Transportation needs:    Medical: Not on file    Non-medical: Not on file  Tobacco Use  . Smoking status: Former Smoker    Last attempt to quit: 07/05/2014    Years since quitting: 4.2  . Smokeless tobacco: Never Used  Substance and Sexual Activity  . Alcohol use: No  . Drug use: No  . Sexual activity: Not on file  Lifestyle  . Physical  activity:    Days per week: Not on file    Minutes per session: Not on file  . Stress: Not on file  Relationships  . Social connections:    Talks on phone: Not on file    Gets together: Not on file    Attends religious service: Not on file    Active member of club or organization: Not on file    Attends meetings of clubs or organizations: Not on file    Relationship status: Not on file  Other Topics Concern  . Not on file  Social History Narrative   Currently lives in a house with her husband.    Fun: Watch TV.   Denies religious beliefs effecting health care.      Family History:  The patient's family history includes Coronary artery disease in her unknown relative; Diabetes in her mother; Heart attack in her paternal grandfather; Hypertension in her paternal grandmother and unknown relative; Kidney disease in her mother; Prostate cancer in her father; Stroke in her paternal grandmother.  ROS:   Please see the history of present illness.   All other systems are reviewed and otherwise negative.    PHYSICAL EXAM:   VS:  BP 114/72   Pulse 62   Ht 5' (1.524 m)   Wt 203 lb (92.1 kg)   SpO2 95%   BMI 39.65 kg/m   BMI: Body mass index is 39.65 kg/m. GEN: Well nourished, well developed morbidly obese WF in no acute distress HEENT: normocephalic, atraumatic Neck: no JVD or masses Cardiac: RRR; no murmurs, rubs, or gallops, compression socks on with trace-1+ BLE edema, L>R Respiratory: clear to auscultation  bilaterally, normal work of breathing GI: soft, nontender, nondistended, + BS MS: kyphotic posture Skin: warm and dry, no rash Neuro:  Alert and Oriented x 3, Strength and sensation are intact, follows commands Psych: euthymic mood, full affect  Wt Readings from Last 3 Encounters:  10/17/18 203 lb (92.1 kg)  08/30/18 203 lb 8 oz (92.3 kg)  01/31/18 204 lb (92.5 kg)      Studies/Labs Reviewed:   EKG:  EKG was not ordered today.  Recent Labs: No results found for requested labs within last 8760 hours.   Lipid Panel    Component Value Date/Time   CHOL 112 01/31/2017 0751   TRIG 104 01/31/2017 0751   HDL 34 (L) 01/31/2017 0751   CHOLHDL 3.3 01/31/2017 0751   CHOLHDL 3.2 12/07/2015 0449   VLDL 23 12/07/2015 0449   LDLCALC 57 01/31/2017 0751    Additional studies/ records that were reviewed today include: Summarized above.   ASSESSMENT & PLAN:   1. Hypotension - BP improved with med changes as above, but still some low outliers at times. Will update labs to ensure no contribution from other abnormality such as anemia, thyroid dysfunction or worsening kidney function. Will await labs before deciding what med changes to make. I thanked her for following her BP so closely at home. 2. H/o HTN - follow with med changes above. 3. Chronic combined CHF due to presumed NICM - she also reports intermittent orthopnea. Will add BNP to labs. Suspect she has intermittent volume retention related to dietary/fluid indiscretion. Reviewed 2g sodium restriction, 2L fluid restriction, daily weights with patient. Weight has been stable at home between a 4lb window of 199-203.  4. CKD stage III - recheck today. 5. Arthritis - will refer to orthopedics. Based on prior experience I think she would be a good fit to  see Dr. Delilah Shan at [facility]  Disposition: F/u with Dr .Percival Spanish in 4 months. The patient does prefer to keep seeing Richardson Dopp regularly as well. She has not seen her  cardiologist in 3 years so we discussed office policy for patient to see their formal cardiologist periodically. I assured her she can continue to see Scott as well.. Addendum: I reviewed the situation with Scott after her visit who is comfortable continuing to follow her since he has maintained continuity over the years. When we call her with labs, will give her a choice of seeing Dr. Percival Spanish or Nicki Reaper in follow-up.   Medication Adjustments/Labs and Tests Ordered: Current medicines are reviewed at length with the patient today.  Concerns regarding medicines are outlined above. Medication changes, Labs and Tests ordered today are summarized above and listed in the Patient Instructions accessible in Encounters.   Signed, Charlie Pitter, PA-C  10/17/2018 8:16 AM    [facility] [address], [place], [place]  [address] Phone: [phone]; Fax: [phone]

## 2018-10-17 ENCOUNTER — Ambulatory Visit: Payer: PPO | Admitting: Physician Assistant

## 2018-10-17 ENCOUNTER — Encounter: Payer: Self-pay | Admitting: Physician Assistant

## 2018-10-17 VITALS — BP 114/72 | HR 62 | Ht 60.0 in | Wt 203.0 lb

## 2018-10-17 DIAGNOSIS — N183 Chronic kidney disease, stage 3 unspecified: Secondary | ICD-10-CM

## 2018-10-17 DIAGNOSIS — M1990 Unspecified osteoarthritis, unspecified site: Secondary | ICD-10-CM

## 2018-10-17 DIAGNOSIS — I10 Essential (primary) hypertension: Secondary | ICD-10-CM | POA: Diagnosis not present

## 2018-10-17 DIAGNOSIS — I959 Hypotension, unspecified: Secondary | ICD-10-CM | POA: Diagnosis not present

## 2018-10-17 DIAGNOSIS — I5042 Chronic combined systolic (congestive) and diastolic (congestive) heart failure: Secondary | ICD-10-CM | POA: Diagnosis not present

## 2018-10-17 DIAGNOSIS — R0601 Orthopnea: Secondary | ICD-10-CM | POA: Diagnosis not present

## 2018-10-17 DIAGNOSIS — I428 Other cardiomyopathies: Secondary | ICD-10-CM

## 2018-10-17 LAB — TSH: TSH: 5.78 u[IU]/mL — ABNORMAL HIGH (ref 0.450–4.500)

## 2018-10-17 LAB — BASIC METABOLIC PANEL
BUN/Creatinine Ratio: 21 (ref 12–28)
BUN: 41 mg/dL — ABNORMAL HIGH (ref 8–27)
CO2: 20 mmol/L (ref 20–29)
Calcium: 9.5 mg/dL (ref 8.7–10.3)
Chloride: 99 mmol/L (ref 96–106)
Creatinine, Ser: 1.91 mg/dL — ABNORMAL HIGH (ref 0.57–1.00)
GFR calc Af Amer: 32 mL/min/{1.73_m2} — ABNORMAL LOW (ref >59)
GFR calc non Af Amer: 27 mL/min/{1.73_m2} — ABNORMAL LOW (ref >59)
Glucose: 167 mg/dL — ABNORMAL HIGH (ref 65–99)
Potassium: 4.5 mmol/L (ref 3.5–5.2)
Sodium: 139 mmol/L (ref 134–144)

## 2018-10-17 LAB — PRO B NATRIURETIC PEPTIDE: NT-Pro BNP: 138 pg/mL (ref 0–287)

## 2018-10-17 LAB — CBC
Hematocrit: 37.3 % (ref 34.0–46.6)
Hemoglobin: 12.4 g/dL (ref 11.1–15.9)
MCH: 26.5 pg — ABNORMAL LOW (ref 26.6–33.0)
MCHC: 33.2 g/dL (ref 31.5–35.7)
MCV: 80 fL (ref 79–97)
Platelets: 279 10*3/uL (ref 150–450)
RBC: 4.68 x10E6/uL (ref 3.77–5.28)
RDW: 14.4 % (ref 12.3–15.4)
WBC: 8.7 10*3/uL (ref 3.4–10.8)

## 2018-10-17 NOTE — Patient Instructions (Signed)
Medication Instructions:  Your physician recommends that you continue on your current medications as directed. Please refer to the Current Medication list given to you today.  If you need a refill on your cardiac medications before your next appointment, please call your pharmacy.   Lab work: TODAY:  BMET, CBC, TSH, & PRO BNP  If you have labs (blood work) drawn today and your tests are completely normal, you will receive your results only by: Marland Kitchen MyChart Message (if you have MyChart) OR . A paper copy in the mail If you have any lab test that is abnormal or we need to change your treatment, we will call you to review the results.  Testing/Procedures: None ordered  You have been referred to Dr. Vickki Hearing @ [facility].  Follow-Up: You will need a follow up appointment in 4 months with Dr. Percival Spanish  Any Other Special Instructions Will Be Listed Below (If Applicable).

## 2018-10-18 ENCOUNTER — Telehealth: Payer: Self-pay | Admitting: *Deleted

## 2018-10-18 MED ORDER — AMLODIPINE BESYLATE 5 MG PO TABS: 5.0000 mg | ORAL_TABLET | Freq: Every day | ORAL | 3 refills | Status: DC

## 2018-10-18 NOTE — Telephone Encounter (Signed)
-----   Message from Charlie Pitter, [place] sent at 10/18/2018  8:40 AM EDT ----- Please call patient. Labs show a rise in her Cr to 1.91 from last value in 01/2017, but Scott's last note referenced an interim value which I presume was from nephrology with Cr of 1.82, so this is not markedly different than prior.  BNP is normal so fluid status overall appears stable, breathing may be due in part from obesity which I know she is struggling to work on. Would continue to elevate legs and use compression stockings, limit sodium and fluid like we talked about (she sometimes goes overboard with fluid intake).  I would suggest she decrease her amlodipine to 5mg  daily down from 10mg  daily given her continued intermittent low BP and swelling, and make an appointment with Dr. Dunham/nephrology PA in the next week or so to discuss if her kidney function needs to be trended more closely or if any changes need to be made to her diuretic or telmisartan doses. Kidney doctors sometimes have a certain threshold where they prefer to stop the telmisartan. On the other hand, sometimes this can stabilize progression so it is a fine line.  Also, needs to f/u PCP for persistently elevated TSH - may represent hypothyroidism. Last value we have on file 4 yrs ago was also abnormal.  Lastly - I reviewed her chart with Scott yesterday. He said he is comfortable continuing to follow her in clinic (with Dr. Percival Spanish from a distance) so if she wants, go ahead and make next f/u with Scott instead. At some point she should see Dr. Percival Spanish again per our office policy but since Nicki Reaper has followed her closely recently, OK to see him in 4 months.  Dayna Dunn PA-C

## 2018-10-18 NOTE — Telephone Encounter (Signed)
Called pt re: lab results. Left a message for pt to call back. 

## 2018-10-18 NOTE — Telephone Encounter (Signed)
Pt has been made aware of her lab results. She will call her kidney / pcp dr to f/u re: lab results. She has been made aware that she can f/u with Nicki Reaper, appt made. Pt will decrease Amlodipine to 5 mg qd. Pt verbalized understanding.

## 2018-10-24 DIAGNOSIS — M79671 Pain in right foot: Secondary | ICD-10-CM | POA: Insufficient documentation

## 2018-10-24 DIAGNOSIS — M79672 Pain in left foot: Secondary | ICD-10-CM | POA: Diagnosis not present

## 2018-10-24 DIAGNOSIS — M19072 Primary osteoarthritis, left ankle and foot: Secondary | ICD-10-CM | POA: Diagnosis not present

## 2018-10-24 DIAGNOSIS — M19071 Primary osteoarthritis, right ankle and foot: Secondary | ICD-10-CM | POA: Diagnosis not present

## 2018-10-26 DIAGNOSIS — I428 Other cardiomyopathies: Secondary | ICD-10-CM | POA: Diagnosis not present

## 2018-10-26 DIAGNOSIS — E1122 Type 2 diabetes mellitus with diabetic chronic kidney disease: Secondary | ICD-10-CM | POA: Diagnosis not present

## 2018-10-26 DIAGNOSIS — Z794 Long term (current) use of insulin: Secondary | ICD-10-CM | POA: Diagnosis not present

## 2018-10-26 DIAGNOSIS — I129 Hypertensive chronic kidney disease with stage 1 through stage 4 chronic kidney disease, or unspecified chronic kidney disease: Secondary | ICD-10-CM | POA: Diagnosis not present

## 2018-10-26 DIAGNOSIS — D649 Anemia, unspecified: Secondary | ICD-10-CM | POA: Diagnosis not present

## 2018-10-26 DIAGNOSIS — N183 Chronic kidney disease, stage 3 (moderate): Secondary | ICD-10-CM | POA: Diagnosis not present

## 2018-10-26 DIAGNOSIS — N2581 Secondary hyperparathyroidism of renal origin: Secondary | ICD-10-CM | POA: Diagnosis not present

## 2018-11-08 DIAGNOSIS — E1140 Type 2 diabetes mellitus with diabetic neuropathy, unspecified: Secondary | ICD-10-CM | POA: Diagnosis not present

## 2018-11-08 DIAGNOSIS — E1165 Type 2 diabetes mellitus with hyperglycemia: Secondary | ICD-10-CM | POA: Diagnosis not present

## 2018-11-08 DIAGNOSIS — E785 Hyperlipidemia, unspecified: Secondary | ICD-10-CM | POA: Diagnosis not present

## 2018-11-08 DIAGNOSIS — I10 Essential (primary) hypertension: Secondary | ICD-10-CM | POA: Diagnosis not present

## 2018-11-14 ENCOUNTER — Ambulatory Visit: Payer: PPO | Admitting: Podiatry

## 2018-11-14 ENCOUNTER — Encounter: Payer: Self-pay | Admitting: Podiatry

## 2018-11-14 ENCOUNTER — Ambulatory Visit: Payer: PPO

## 2018-11-14 VITALS — BP 159/81 | HR 58 | Resp 16

## 2018-11-14 DIAGNOSIS — E1142 Type 2 diabetes mellitus with diabetic polyneuropathy: Secondary | ICD-10-CM

## 2018-11-15 NOTE — Progress Notes (Signed)
Subjective:  Patient ID: [name], female    DOB: November 13, 1956,  MRN: [id number] HPI Chief Complaint  Patient presents with  . Diabetes    Patient interested in diabetic shoes and insoles, has RA, diabetic doc said she was eligible for 2 pair per year, she sees Dr. Jacelyn Pi  . New Patient (Initial Visit)    62 y.o. female presents with the above complaint.   ROS: Denies fever chills nausea vomiting muscle aches pains back pain chest pain shortness of breath.  Past Medical History:  Diagnosis Date  . Arthritis of both feet   . Chronic combined systolic and diastolic CHF (congestive heart failure) ([place])    a. Echo (06/2014): Severe LVH, EF 20-25%, no RWMA, Gr 3 DD, trivial MR, mild LAE, mild RVE, PASP 38 mmHg .>> b. Echo 2/16 EF 50-55%  . CKD (chronic kidney disease), stage III ([place])   . Diabetes mellitus type 2 in obese ([place])   . Diverticular disease   . Fatty liver   . History of GI diverticular bleed   . Hyperlipidemia   . Hyperphosphatemia   . Hypertension   . Hypertensive heart disease    a. Renal Artery Duplex (8/15):  no RAS  . Iron deficiency anemia   . NICM (nonischemic cardiomyopathy) ([place])    a. Nuclear (06/2014): EF 32%, apical thinning, no definite scar or ischemia;  b. Echo (2/16):  Mild LVH, EF 50-55%, Gr 2 DD, no RWMA  . Obesity   . Proteinuria   . PUD (peptic ulcer disease)   . Secondary hyperparathyroidism, renal ([place])   . Tobacco abuse   . UTI (lower urinary tract infection)   . Vaginal dryness, menopausal   . Vaginal yeast infection    Past Surgical History:  Procedure Laterality Date  . DILATION AND CURETTAGE OF UTERUS    . None      Current Outpatient Medications:  .  iron polysaccharides (NU-IRON) 150 MG capsule, Take 150 mg by mouth daily., Disp: , Rfl:  .  Polyethylene Glycol 3350 (MIRALAX PO), Take by mouth., Disp: , Rfl:  .  acetaminophen (TYLENOL) 500 MG tablet, Take 1,000 mg by mouth every 6 (six) hours as needed for mild pain,  moderate pain or headache. For pain, Disp: , Rfl:  .  amLODipine (NORVASC) 5 MG tablet, Take 1 tablet (5 mg total) by mouth daily., Disp: 90 tablet, Rfl: 3 .  aspirin EC 81 MG EC tablet, Take 1 tablet (81 mg total) by mouth daily., Disp: , Rfl:  .  atorvastatin (LIPITOR) 40 MG tablet, Take 1 tablet (40 mg total) by mouth daily at 6 PM., Disp: 90 tablet, Rfl: 3 .  B-D ULTRAFINE III SHORT PEN 31G X 8 MM MISC, USE AS DIRECTED *EMERGENCY FILL*, Disp: , Rfl: 0 .  blood glucose meter kit and supplies KIT, Dispense based on patient and insurance preference. Use up to four times daily as directed. (FOR ICD-9 250.00, 250.01)., Disp: 1 each, Rfl: 0 .  cloNIDine (CATAPRES) 0.1 MG tablet, Take 1 tablet (0.1 mg total) by mouth 2 (two) times daily., Disp: 180 tablet, Rfl: 3 .  furosemide (LASIX) 40 MG tablet, Take 80 mg by mouth daily. , Disp: , Rfl: 5 .  gabapentin (NEURONTIN) 100 MG capsule, Take 100 mg by mouth 3 (three) times daily., Disp: , Rfl:  .  insulin aspart (NOVOLOG FLEXPEN) 100 UNIT/ML FlexPen, Inject 5 Units into the skin 3 (three) times daily with meals. Additional insulin (5 units PLUS)  CBG 70 - 120: 0 units CBG 121 - 150: 1 unit CBG 151 - 200: 2 units CBG 201 - 250: 3 units CBG 251 - 350: 4 units CBG 351 - 400: 6 units, Disp: 15 mL, Rfl: 11 .  Insulin Glargine (LANTUS SOLOSTAR) 100 UNIT/ML Solostar Pen, Inject 10 Units into the skin daily at 10 pm., Disp: , Rfl:  .  isosorbide mononitrate (IMDUR) 120 MG 24 hr tablet, TAKE 1/2 TABLET DAILY = 60 MG DAILY, Disp: , Rfl:  .  metoprolol tartrate (LOPRESSOR) 25 MG tablet, Take 1 tablet (25 mg total) by mouth 2 (two) times daily., Disp: 180 tablet, Rfl: 3 .  ONE TOUCH ULTRA TEST test strip, 1 each by Other route 4 (four) times daily. , Disp: , Rfl: 1 .  ONETOUCH DELICA LANCETS 09T MISC, Inject 33 g into the skin 4 (four) times daily. , Disp: , Rfl: 1 .  telmisartan (MICARDIS) 20 MG tablet, Take 1 tablet (20 mg total) by mouth daily., Disp: 90 tablet, Rfl:  3 .  terazosin (HYTRIN) 1 MG capsule, TAKE 1 CAPSULE (1 MG TOTAL) BY MOUTH AT BEDTIME., Disp: 90 capsule, Rfl: 3  Allergies  Allergen Reactions  . Hydralazine Swelling  . Other Other (See Comments)    Unknown blood pressure medication caused face swelling   Review of Systems Objective:   Vitals:   11/14/18 1400  BP: (!) 159/81  Pulse: (!) 58  Resp: 16    General: Well developed, nourished, in no acute distress, alert and oriented x3   Dermatological: Skin is warm, dry and supple bilateral. Nails x 10 are well maintained; remaining integument appears unremarkable at this time. There are no open sores, no preulcerative lesions, no rash or signs of infection present.  Vascular: Dorsalis Pedis artery and Posterior Tibial artery pedal pulses are 2/4 bilateral with immedate capillary fill time. Pedal hair growth present. No varicosities and no lower extremity edema present bilateral.  Swelling and edema is noted bilateral pulses are strong and palpable.  Neruologic: Grossly intact via light touch bilateral. Vibratory intact via tuning fork bilateral. Protective threshold with Semmes Wienstein monofilament absent to all pedal sites bilateral forefoot. Patellar and Achilles deep tendon reflexes 2+ bilateral. No Babinski or clonus noted bilateral.   Musculoskeletal: No gross boney pedal deformities bilateral. No pain, crepitus, or limitation noted with foot and ankle range of motion bilateral. Muscular strength 5/5 in all groups tested bilateral.  Severe digital deformities varus first metatarsophalangeal joint with hammertoe deformities all of the right foot.  Some medial deviation of those toes as well.  Contralateral foot left foot demonstrates hallux valgus deformity lateral deviation of the toes left.  Subluxation of the bilateral foot.  We are unable to assess her gait she cannot stand well without support.  Gait: Unassisted, Nonantalgic.    Radiographs:  Radiographs of the bilateral  ankle demonstrates arthritis  Assessment & Plan:   Assessment: Rheumatoid arthritis and diabetic peripheral neuropathy edema bilateral lower extremity probable posterior tibial tendon dysfunction  Plan: She needs a type of supportive device however I feel that the edema in the legs are going to be inhibitory to that.  Diabetic shoes would help however I do not think they will have enough stability unless we get a high top boot type shoe I think that an [place] brace would not be a good option secondary to the variable edema.  She still needs a shoe to accommodate the digital deformities.  I am going to send  her to our ped orthotist and let them discuss this and he will determine what type of device will be best to stabilize this patient and allow for digits not to rub.     Max T. [place], [place]

## 2018-11-27 DIAGNOSIS — M25473 Effusion, unspecified ankle: Secondary | ICD-10-CM | POA: Diagnosis not present

## 2018-11-27 DIAGNOSIS — M79641 Pain in right hand: Secondary | ICD-10-CM | POA: Diagnosis not present

## 2018-11-27 DIAGNOSIS — M25572 Pain in left ankle and joints of left foot: Secondary | ICD-10-CM | POA: Diagnosis not present

## 2018-11-27 DIAGNOSIS — M2140 Flat foot [pes planus] (acquired), unspecified foot: Secondary | ICD-10-CM | POA: Diagnosis not present

## 2018-11-27 DIAGNOSIS — M19071 Primary osteoarthritis, right ankle and foot: Secondary | ICD-10-CM | POA: Diagnosis not present

## 2018-11-27 DIAGNOSIS — M1990 Unspecified osteoarthritis, unspecified site: Secondary | ICD-10-CM | POA: Diagnosis not present

## 2018-11-27 DIAGNOSIS — E1169 Type 2 diabetes mellitus with other specified complication: Secondary | ICD-10-CM | POA: Diagnosis not present

## 2018-11-27 DIAGNOSIS — M1811 Unilateral primary osteoarthritis of first carpometacarpal joint, right hand: Secondary | ICD-10-CM | POA: Diagnosis not present

## 2018-11-27 DIAGNOSIS — E785 Hyperlipidemia, unspecified: Secondary | ICD-10-CM | POA: Diagnosis not present

## 2018-11-27 DIAGNOSIS — M1812 Unilateral primary osteoarthritis of first carpometacarpal joint, left hand: Secondary | ICD-10-CM | POA: Diagnosis not present

## 2018-11-27 DIAGNOSIS — M79672 Pain in left foot: Secondary | ICD-10-CM | POA: Diagnosis not present

## 2018-11-27 DIAGNOSIS — M25571 Pain in right ankle and joints of right foot: Secondary | ICD-10-CM | POA: Diagnosis not present

## 2018-11-27 DIAGNOSIS — N183 Chronic kidney disease, stage 3 (moderate): Secondary | ICD-10-CM | POA: Diagnosis not present

## 2018-11-27 DIAGNOSIS — J984 Other disorders of lung: Secondary | ICD-10-CM | POA: Diagnosis not present

## 2018-11-27 DIAGNOSIS — M79673 Pain in unspecified foot: Secondary | ICD-10-CM | POA: Diagnosis not present

## 2018-11-27 DIAGNOSIS — M19072 Primary osteoarthritis, left ankle and foot: Secondary | ICD-10-CM | POA: Diagnosis not present

## 2018-11-27 DIAGNOSIS — M79671 Pain in right foot: Secondary | ICD-10-CM | POA: Diagnosis not present

## 2018-11-27 DIAGNOSIS — M2560 Stiffness of unspecified joint, not elsewhere classified: Secondary | ICD-10-CM | POA: Diagnosis not present

## 2018-11-27 DIAGNOSIS — M25579 Pain in unspecified ankle and joints of unspecified foot: Secondary | ICD-10-CM | POA: Diagnosis not present

## 2018-11-27 DIAGNOSIS — M79642 Pain in left hand: Secondary | ICD-10-CM | POA: Diagnosis not present

## 2018-11-27 DIAGNOSIS — M79646 Pain in unspecified finger(s): Secondary | ICD-10-CM | POA: Diagnosis not present

## 2018-11-29 ENCOUNTER — Ambulatory Visit: Payer: PPO | Admitting: Orthotics

## 2018-11-29 DIAGNOSIS — N183 Chronic kidney disease, stage 3 unspecified: Secondary | ICD-10-CM

## 2018-11-29 DIAGNOSIS — E1142 Type 2 diabetes mellitus with diabetic polyneuropathy: Secondary | ICD-10-CM

## 2018-11-29 DIAGNOSIS — E1122 Type 2 diabetes mellitus with diabetic chronic kidney disease: Secondary | ICD-10-CM

## 2018-11-29 DIAGNOSIS — Z794 Long term (current) use of insulin: Secondary | ICD-10-CM

## 2018-12-20 HISTORY — PX: MASTECTOMY: SHX3

## 2018-12-20 HISTORY — PX: BREAST BIOPSY: SHX20

## 2018-12-20 NOTE — Progress Notes (Signed)
Saw [name] for casting for Custom diabetic shoes to address RA foot coniditions.

## 2018-12-28 ENCOUNTER — Other Ambulatory Visit: Payer: Self-pay | Admitting: Physician Assistant

## 2018-12-28 DIAGNOSIS — I110 Hypertensive heart disease with heart failure: Secondary | ICD-10-CM

## 2018-12-28 NOTE — Telephone Encounter (Signed)
Pt's pharmacy is requesting a refill on terazosin. Would Richardson Dopp, PA like to refill this medication? Please address

## 2019-01-01 DIAGNOSIS — M25473 Effusion, unspecified ankle: Secondary | ICD-10-CM | POA: Diagnosis not present

## 2019-01-01 DIAGNOSIS — M79646 Pain in unspecified finger(s): Secondary | ICD-10-CM | POA: Diagnosis not present

## 2019-01-01 DIAGNOSIS — N183 Chronic kidney disease, stage 3 (moderate): Secondary | ICD-10-CM | POA: Diagnosis not present

## 2019-01-01 DIAGNOSIS — M79673 Pain in unspecified foot: Secondary | ICD-10-CM | POA: Diagnosis not present

## 2019-01-01 DIAGNOSIS — M1990 Unspecified osteoarthritis, unspecified site: Secondary | ICD-10-CM | POA: Diagnosis not present

## 2019-01-01 DIAGNOSIS — M25579 Pain in unspecified ankle and joints of unspecified foot: Secondary | ICD-10-CM | POA: Diagnosis not present

## 2019-01-01 DIAGNOSIS — M2140 Flat foot [pes planus] (acquired), unspecified foot: Secondary | ICD-10-CM | POA: Diagnosis not present

## 2019-01-01 DIAGNOSIS — E1169 Type 2 diabetes mellitus with other specified complication: Secondary | ICD-10-CM | POA: Diagnosis not present

## 2019-01-01 DIAGNOSIS — M109 Gout, unspecified: Secondary | ICD-10-CM | POA: Diagnosis not present

## 2019-01-01 DIAGNOSIS — M2560 Stiffness of unspecified joint, not elsewhere classified: Secondary | ICD-10-CM | POA: Diagnosis not present

## 2019-01-10 ENCOUNTER — Ambulatory Visit: Payer: PPO | Admitting: Orthotics

## 2019-01-10 DIAGNOSIS — M2041 Other hammer toe(s) (acquired), right foot: Secondary | ICD-10-CM | POA: Diagnosis not present

## 2019-01-10 DIAGNOSIS — E1122 Type 2 diabetes mellitus with diabetic chronic kidney disease: Secondary | ICD-10-CM

## 2019-01-10 DIAGNOSIS — M2012 Hallux valgus (acquired), left foot: Secondary | ICD-10-CM | POA: Diagnosis not present

## 2019-01-10 DIAGNOSIS — E1142 Type 2 diabetes mellitus with diabetic polyneuropathy: Secondary | ICD-10-CM | POA: Diagnosis not present

## 2019-01-10 DIAGNOSIS — Z794 Long term (current) use of insulin: Secondary | ICD-10-CM

## 2019-01-10 DIAGNOSIS — N183 Chronic kidney disease, stage 3 unspecified: Secondary | ICD-10-CM

## 2019-01-26 ENCOUNTER — Encounter: Payer: Self-pay | Admitting: Physician Assistant

## 2019-02-06 DIAGNOSIS — Z794 Long term (current) use of insulin: Secondary | ICD-10-CM | POA: Diagnosis not present

## 2019-02-06 DIAGNOSIS — I428 Other cardiomyopathies: Secondary | ICD-10-CM | POA: Diagnosis not present

## 2019-02-06 DIAGNOSIS — N184 Chronic kidney disease, stage 4 (severe): Secondary | ICD-10-CM | POA: Diagnosis not present

## 2019-02-06 DIAGNOSIS — I129 Hypertensive chronic kidney disease with stage 1 through stage 4 chronic kidney disease, or unspecified chronic kidney disease: Secondary | ICD-10-CM | POA: Diagnosis not present

## 2019-02-06 DIAGNOSIS — E1122 Type 2 diabetes mellitus with diabetic chronic kidney disease: Secondary | ICD-10-CM | POA: Diagnosis not present

## 2019-02-06 DIAGNOSIS — M109 Gout, unspecified: Secondary | ICD-10-CM | POA: Diagnosis not present

## 2019-02-06 DIAGNOSIS — D649 Anemia, unspecified: Secondary | ICD-10-CM | POA: Diagnosis not present

## 2019-02-06 DIAGNOSIS — N2581 Secondary hyperparathyroidism of renal origin: Secondary | ICD-10-CM | POA: Diagnosis not present

## 2019-02-12 DIAGNOSIS — M2140 Flat foot [pes planus] (acquired), unspecified foot: Secondary | ICD-10-CM | POA: Diagnosis not present

## 2019-02-12 DIAGNOSIS — N183 Chronic kidney disease, stage 3 (moderate): Secondary | ICD-10-CM | POA: Diagnosis not present

## 2019-02-12 DIAGNOSIS — M109 Gout, unspecified: Secondary | ICD-10-CM | POA: Diagnosis not present

## 2019-02-12 DIAGNOSIS — M79673 Pain in unspecified foot: Secondary | ICD-10-CM | POA: Diagnosis not present

## 2019-02-12 DIAGNOSIS — M1990 Unspecified osteoarthritis, unspecified site: Secondary | ICD-10-CM | POA: Diagnosis not present

## 2019-02-12 DIAGNOSIS — M79646 Pain in unspecified finger(s): Secondary | ICD-10-CM | POA: Diagnosis not present

## 2019-02-12 DIAGNOSIS — M25579 Pain in unspecified ankle and joints of unspecified foot: Secondary | ICD-10-CM | POA: Diagnosis not present

## 2019-02-12 DIAGNOSIS — M2560 Stiffness of unspecified joint, not elsewhere classified: Secondary | ICD-10-CM | POA: Diagnosis not present

## 2019-02-12 DIAGNOSIS — E1169 Type 2 diabetes mellitus with other specified complication: Secondary | ICD-10-CM | POA: Diagnosis not present

## 2019-02-12 DIAGNOSIS — M25473 Effusion, unspecified ankle: Secondary | ICD-10-CM | POA: Diagnosis not present

## 2019-02-14 ENCOUNTER — Ambulatory Visit: Payer: PPO | Admitting: Physician Assistant

## 2019-02-14 ENCOUNTER — Encounter: Payer: Self-pay | Admitting: Physician Assistant

## 2019-02-14 VITALS — BP 120/82 | HR 62 | Ht 60.0 in | Wt 204.0 lb

## 2019-02-14 DIAGNOSIS — I5042 Chronic combined systolic (congestive) and diastolic (congestive) heart failure: Secondary | ICD-10-CM

## 2019-02-14 DIAGNOSIS — N183 Chronic kidney disease, stage 3 unspecified: Secondary | ICD-10-CM

## 2019-02-14 DIAGNOSIS — I110 Hypertensive heart disease with heart failure: Secondary | ICD-10-CM | POA: Diagnosis not present

## 2019-02-14 NOTE — Progress Notes (Signed)
Cardiology Office Note:    Date:  02/14/2019   ID:  [name], DOB 12-05-1956, MRN [id number]  PCP:  Lahoma Rocker, MD  Cardiologist:  Minus Breeding, MD / Richardson Dopp, PA-C  Electrophysiologist:  None  Nephrologist:  Dr. [name]  Referring MD: [name], MD   Chief Complaint  Patient presents with  . Follow-up    CHF, HTN     History of Present Illness:    [name] is a 63 y.o. female with hypertension, combined systolic and diastolic heart failure secondary to nonischemic cardiomyopathy, chronic kidney disease, diabetes. She was admitted in July 2015 with decompensated heart failure and worsening renal function in the setting of hypertensive urgency. Echocardiogram demonstrated EF 20-25. She has a true allergy to hydralazine and has not been able to tolerate carvedilol in the past. Nuclear stress testing demonstrated no scar or ischemia. Renal artery ultrasound in August 2015 was negative for renal artery stenosis. Echocardiogram in 2016 demonstrated improved LV function with an EF of 50-55 and grade 2 diastolic dysfunction.   I last saw her in September 2019.  Since then, some of her medications have been reduced secondary to low blood pressure.   [name] returns for follow up.  Since last seen, she continues to have episodes of hypotension.  She is symptomatic with this.  She shows me a detailed list of her blood pressures.  She often has blood pressures above target in the morning.  Her breathing remains stable.  She does have to take an occasional extra Lasix due to worsening shortness of breath and swelling.  She has not had chest discomfort, paroxysmal nocturnal dyspnea or syncope.  Prior CV studies:   The following studies were reviewed today:   Echo (01/27/15):  Mild LVH, EF 50-55%, normal wall motion, grade 2 diastolic dysfunction  Echo (06/2014): Severe LVH, EF 20-25%, no RWMA, Gr 3 DD, trivial MR, mild LAE, mild RVE, PASP 38 mmHg.  Nuclear (06/2014): EF  32%, apical thinning, no definite scar or ischemia  Past Medical History:  Diagnosis Date  . Arthritis of both feet   . Chronic combined systolic and diastolic CHF (congestive heart failure) ([place])    a. Echo (06/2014): Severe LVH, EF 20-25%, no RWMA, Gr 3 DD, trivial MR, mild LAE, mild RVE, PASP 38 mmHg .>> b. Echo 2/16 EF 50-55%  . CKD (chronic kidney disease), stage III ([place])   . Diabetes mellitus type 2 in obese ([place])   . Diverticular disease   . Fatty liver   . History of GI diverticular bleed   . Hyperlipidemia   . Hyperphosphatemia   . Hypertension   . Hypertensive heart disease    a. Renal Artery Duplex (8/15):  no RAS  . Iron deficiency anemia   . NICM (nonischemic cardiomyopathy) ([place])    a. Nuclear (06/2014): EF 32%, apical thinning, no definite scar or ischemia;  b. Echo (2/16):  Mild LVH, EF 50-55%, Gr 2 DD, no RWMA  . Obesity   . Proteinuria   . PUD (peptic ulcer disease)   . Secondary hyperparathyroidism, renal ([place])   . Tobacco abuse   . UTI (lower urinary tract infection)   . Vaginal dryness, menopausal   . Vaginal yeast infection    Surgical Hx: The patient  has a past surgical history that includes None and Dilation and curettage of uterus.   Current Medications: Current Meds  Medication Sig  . acetaminophen (TYLENOL) 500 MG tablet Take 1,000 mg by mouth every  6 (six) hours as needed for mild pain, moderate pain or headache. For pain  . allopurinol (ZYLOPRIM) 100 MG tablet Take 200 mg by mouth daily.  Marland Kitchen amLODipine (NORVASC) 5 MG tablet Take 1 tablet (5 mg total) by mouth daily.  Marland Kitchen aspirin EC 81 MG EC tablet Take 1 tablet (81 mg total) by mouth daily.  Marland Kitchen atorvastatin (LIPITOR) 40 MG tablet Take 1 tablet (40 mg total) by mouth daily at 6 PM.  . calcitRIOL (ROCALTROL) 0.25 MCG capsule Take 0.25 mcg by mouth daily.  . cloNIDine (CATAPRES) 0.1 MG tablet Take 1 tablet (0.1 mg total) by mouth 2 (two) times daily.  . Colchicine 0.6 MG CAPS Take by mouth every other  day.   . furosemide (LASIX) 40 MG tablet Take 40 mg by mouth 2 (two) times daily.  Marland Kitchen gabapentin (NEURONTIN) 100 MG capsule Take 100 mg by mouth 3 (three) times daily.  . insulin aspart (NOVOLOG FLEXPEN) 100 UNIT/ML FlexPen Inject 5 Units into the skin 3 (three) times daily with meals. Additional insulin (5 units PLUS) CBG 70 - 120: 0 units CBG 121 - 150: 1 unit CBG 151 - 200: 2 units CBG 201 - 250: 3 units CBG 251 - 350: 4 units CBG 351 - 400: 6 units  . Insulin Glargine (LANTUS SOLOSTAR) 100 UNIT/ML Solostar Pen Inject 17 Units into the skin daily at 10 pm.   . iron polysaccharides (NU-IRON) 150 MG capsule Take 150 mg by mouth 2 (two) times daily.   . isosorbide mononitrate (IMDUR) 120 MG 24 hr tablet TAKE 1/2 TABLET DAILY = 60 MG DAILY  . metoprolol tartrate (LOPRESSOR) 25 MG tablet Take 1 tablet (25 mg total) by mouth 2 (two) times daily.  . Polyethylene Glycol 3350 (MIRALAX PO) Take by mouth daily as needed.   Marland Kitchen telmisartan (MICARDIS) 20 MG tablet Take 1 tablet (20 mg total) by mouth daily.     Allergies:   Hydralazine and Other   Social History   Tobacco Use  . Smoking status: Former Smoker    Last attempt to quit: 07/05/2014    Years since quitting: 4.6  . Smokeless tobacco: Never Used  Substance Use Topics  . Alcohol use: No  . Drug use: No     Family Hx: The patient's family history includes Coronary artery disease in an other family member; Diabetes in her mother; Heart attack in her paternal grandfather; Hypertension in her paternal grandmother and another family member; Kidney disease in her mother; Prostate cancer in her father; Stroke in her paternal grandmother.  ROS:   Please see the history of present illness.    ROS All other systems reviewed and are negative.   EKGs/Labs/Other Test Reviewed:    EKG:  EKG is  ordered today.  The ekg ordered today demonstrates normal sinus rhythm, heart rate 62, normal axis, T wave inversions 1, aVL, V6, QTC 452, no  significant change when compared to prior tracing  Recent Labs: 10/17/2018: BUN 41; Creatinine, Ser 1.91; Hemoglobin 12.4; NT-Pro BNP 138; Platelets 279; Potassium 4.5; Sodium 139; TSH 5.780   Labs from nephrology 02/06/2019: BUN 47, creatinine 1.63, potassium 4.5, hemoglobin 13.2  Recent Lipid Panel Lab Results  Component Value Date/Time   CHOL 112 01/31/2017 07:51 AM   TRIG 104 01/31/2017 07:51 AM   HDL 34 (L) 01/31/2017 07:51 AM   CHOLHDL 3.3 01/31/2017 07:51 AM   CHOLHDL 3.2 12/07/2015 04:49 AM   LDLCALC 57 01/31/2017 07:51 AM    Physical Exam:  VS:  BP 120/82   Pulse 62   Ht 5' (1.524 m)   Wt 204 lb (92.5 kg)   BMI 39.84 kg/m     Wt Readings from Last 3 Encounters:  02/14/19 204 lb (92.5 kg)  10/17/18 203 lb (92.1 kg)  08/30/18 203 lb 8 oz (92.3 kg)     Physical Exam  Constitutional: She is oriented to person, place, and time. She appears well-developed and well-nourished. No distress.  HENT:  Head: Normocephalic and atraumatic.  Neck: No JVD present.  Cardiovascular: Normal rate and regular rhythm.  No murmur heard. Pulmonary/Chest: Effort normal. She has no rales.  Abdominal: Soft.  Musculoskeletal:        General: Edema (trace-1+ bilat LE edema) present.  Neurological: She is alert and oriented to person, place, and time.  Skin: Skin is warm and dry.    ASSESSMENT & PLAN:    Chronic combined systolic and diastolic CHF (congestive heart failure) (HCC) EF 50-55 in February 2016.  NYHA 3 a.  Volume status overall stable.  Continue beta-blocker, ARB, nitrates.  Arrange follow-up echocardiogram.  Hypertensive heart disease with heart failure ([place])  Blood pressure optimal today.  However, she has episodes of low blood pressure at home as well as episodes of uncontrolled blood pressure.  Question if this is related to the timing of her medications.  I have asked her to take amlodipine and Micardis in the evening.  We discussed taking metoprolol and clonidine  every 12 hours.  She can continue taking isosorbide in the morning.  I will see her back in 3 months for follow-up.  CKD (chronic kidney disease), stage III (HCC) Recent creatinine stable.  Continue follow-up with nephrology as planned.   Dispo:  Return in about 3 months (around 05/15/2019) for Routine Follow Up, w/ Richardson Dopp, PA-C.   Medication Adjustments/Labs and Tests Ordered: Current medicines are reviewed at length with the patient today.  Concerns regarding medicines are outlined above.  Tests Ordered: Orders Placed This Encounter  Procedures  . EKG 12-Lead  . ECHOCARDIOGRAM COMPLETE   Medication Changes: No orders of the defined types were placed in this encounter.   Signed, Richardson Dopp, PA-C  02/14/2019 4:18 PM    [facility] [place], [place], [place]  [address] Phone: [phone]; Fax: [phone]

## 2019-02-14 NOTE — Patient Instructions (Signed)
Medication Instructions:  Take your Amlodipine in the evening. Take your Clonidine every 12 hours (e.g. 7 am and 7 pm) Take your Lasix 6 hours apart (so you do not take it too late in the day and have increased urination at nighttime. Continue taking Isosorbide (Imdur) in the morning. Take Metoprolol every 12 hours (e.g. 7 am and 7 pm) Continue taking Micardis (Telmisartan) in the evening.  If you need a refill on your cardiac medications before your next appointment, please call your pharmacy.   Lab work: None   If you have labs (blood work) drawn today and your tests are completely normal, you will receive your results only by: Marland Kitchen MyChart Message (if you have MyChart) OR . A paper copy in the mail If you have any lab test that is abnormal or we need to change your treatment, we will call you to review the results.  Testing/Procedures:  Schedule an echocardiogram   Follow-Up: At [facility], you and your health needs are our priority.  As part of our continuing mission to provide you with exceptional heart care, we have created designated Provider Care Teams.  These Care Teams include your primary Cardiologist (physician) and Advanced Practice Providers (APPs -  Physician Assistants and Nurse Practitioners) who all work together to provide you with the care you need, when you need it. Richardson Dopp, PA-C in 3 months  Any Other Special Instructions Will Be Listed Below (If Applicable). Follow up with Dr. Chalmers Cater about your thyroid.

## 2019-02-26 ENCOUNTER — Encounter: Payer: Self-pay | Admitting: Physician Assistant

## 2019-02-26 ENCOUNTER — Ambulatory Visit (HOSPITAL_COMMUNITY): Payer: PPO | Attending: Cardiology

## 2019-02-26 ENCOUNTER — Ambulatory Visit: Payer: PPO | Admitting: Cardiology

## 2019-02-26 DIAGNOSIS — I5042 Chronic combined systolic (congestive) and diastolic (congestive) heart failure: Secondary | ICD-10-CM | POA: Insufficient documentation

## 2019-02-27 ENCOUNTER — Telehealth: Payer: Self-pay

## 2019-03-07 ENCOUNTER — Other Ambulatory Visit: Payer: Self-pay | Admitting: Physician Assistant

## 2019-03-08 DIAGNOSIS — R946 Abnormal results of thyroid function studies: Secondary | ICD-10-CM | POA: Diagnosis not present

## 2019-03-08 DIAGNOSIS — I10 Essential (primary) hypertension: Secondary | ICD-10-CM | POA: Diagnosis not present

## 2019-03-08 DIAGNOSIS — E1169 Type 2 diabetes mellitus with other specified complication: Secondary | ICD-10-CM | POA: Diagnosis not present

## 2019-03-08 DIAGNOSIS — E1140 Type 2 diabetes mellitus with diabetic neuropathy, unspecified: Secondary | ICD-10-CM | POA: Diagnosis not present

## 2019-03-08 DIAGNOSIS — E1165 Type 2 diabetes mellitus with hyperglycemia: Secondary | ICD-10-CM | POA: Diagnosis not present

## 2019-03-08 DIAGNOSIS — E785 Hyperlipidemia, unspecified: Secondary | ICD-10-CM | POA: Diagnosis not present

## 2019-03-08 DIAGNOSIS — N189 Chronic kidney disease, unspecified: Secondary | ICD-10-CM | POA: Diagnosis not present

## 2019-03-09 ENCOUNTER — Telehealth: Payer: Self-pay | Admitting: Physician Assistant

## 2019-03-09 NOTE — Telephone Encounter (Signed)
New Message:   Patient calling to report that her, thyroid came back normal.

## 2019-03-20 NOTE — Progress Notes (Signed)

## 2019-04-17 DIAGNOSIS — Z0000 Encounter for general adult medical examination without abnormal findings: Secondary | ICD-10-CM | POA: Diagnosis not present

## 2019-04-17 DIAGNOSIS — Z87448 Personal history of other diseases of urinary system: Secondary | ICD-10-CM | POA: Diagnosis not present

## 2019-04-17 DIAGNOSIS — R609 Edema, unspecified: Secondary | ICD-10-CM | POA: Diagnosis not present

## 2019-04-17 DIAGNOSIS — I2510 Atherosclerotic heart disease of native coronary artery without angina pectoris: Secondary | ICD-10-CM | POA: Diagnosis not present

## 2019-04-17 DIAGNOSIS — E119 Type 2 diabetes mellitus without complications: Secondary | ICD-10-CM | POA: Diagnosis not present

## 2019-04-18 DIAGNOSIS — M25473 Effusion, unspecified ankle: Secondary | ICD-10-CM | POA: Diagnosis not present

## 2019-04-18 DIAGNOSIS — Z79899 Other long term (current) drug therapy: Secondary | ICD-10-CM | POA: Diagnosis not present

## 2019-04-18 DIAGNOSIS — M2560 Stiffness of unspecified joint, not elsewhere classified: Secondary | ICD-10-CM | POA: Diagnosis not present

## 2019-04-18 DIAGNOSIS — M25579 Pain in unspecified ankle and joints of unspecified foot: Secondary | ICD-10-CM | POA: Diagnosis not present

## 2019-04-18 DIAGNOSIS — E1169 Type 2 diabetes mellitus with other specified complication: Secondary | ICD-10-CM | POA: Diagnosis not present

## 2019-04-18 DIAGNOSIS — M109 Gout, unspecified: Secondary | ICD-10-CM | POA: Diagnosis not present

## 2019-04-18 DIAGNOSIS — M79646 Pain in unspecified finger(s): Secondary | ICD-10-CM | POA: Diagnosis not present

## 2019-04-18 DIAGNOSIS — M1990 Unspecified osteoarthritis, unspecified site: Secondary | ICD-10-CM | POA: Diagnosis not present

## 2019-04-18 DIAGNOSIS — M79673 Pain in unspecified foot: Secondary | ICD-10-CM | POA: Diagnosis not present

## 2019-04-18 DIAGNOSIS — N183 Chronic kidney disease, stage 3 (moderate): Secondary | ICD-10-CM | POA: Diagnosis not present

## 2019-04-18 DIAGNOSIS — M2140 Flat foot [pes planus] (acquired), unspecified foot: Secondary | ICD-10-CM | POA: Diagnosis not present

## 2019-05-11 ENCOUNTER — Telehealth: Payer: Self-pay | Admitting: *Deleted

## 2019-05-11 ENCOUNTER — Telehealth: Payer: Self-pay | Admitting: Physician Assistant

## 2019-05-11 NOTE — Telephone Encounter (Signed)
New Message   Patient agreed to do a telephone visit with Nicki Reaper and I changed it in the system and left the phone number to call in the notes.

## 2019-05-11 NOTE — Telephone Encounter (Signed)
LVM TO CALL BACK ABOUT APPT

## 2019-05-14 NOTE — Progress Notes (Signed)
Virtual Visit via Telephone Note   This visit type was conducted due to national recommendations for restrictions regarding the COVID-19 Pandemic (e.g. social distancing) in an effort to limit this patient's exposure and mitigate transmission in our community.  Due to her co-morbid illnesses, this patient is at least at moderate risk for complications without adequate follow up.  This format is felt to be most appropriate for this patient at this time.  The patient did not have access to video technology/had technical difficulties with video requiring transitioning to audio format only (telephone).  All issues noted in this document were discussed and addressed.  No physical exam could be performed with this format.  Please refer to the patient's chart for her  consent to telehealth for [facility].   Date:  05/15/2019   ID:  [name], DOB 1956/12/17, MRN [id number]  Patient Location: Home Provider Location: Home  PCP:  [name], MD  Cardiologist:  Minus Breeding, MD / Richardson Dopp, PA-C  Electrophysiologist:  None  Nephrologist: Dr. [name] Rheumatologist:  Dr. Joellyn Rued ([place])  Evaluation Performed:  Follow-Up Visit  Chief Complaint:  FU on CHF  History of Present Illness:    [name] is a 63 y.o. female with hypertension, combined systolic and diastolic heart failure secondary to nonischemic cardiomyopathy, chronic kidney disease, diabetes. She was admitted in July 2015 with decompensated heart failure and worsening renal function in the setting of hypertensive urgency. Echocardiogram demonstrated EF 20-25. She has a true allergy to hydralazine and has not been able to tolerate carvedilol in the past. Nuclear stress testing demonstrated no scar or ischemia. Renal artery ultrasound in August 2015 was negative for renal artery stenosis. Echocardiogram in 2016 demonstrated improved LV function with an EF of 50-55 and grade 2 diastolic dysfunction. Lately, she  has had medications reduced/discontinued due to low blood pressure.  I last saw her in Feb 2020.  Today, she notes she is doing well.  She has fairly optimal blood pressure readings.  She no longer has nausea and near syncope with her lower readings.  She has not had chest pain, shortness of breath, syncope, paroxysmal nocturnal dyspnea.  Her weights have been stable.  She does have a L breast mass and is waiting to get a mammogram scheduled.    The patient does not have symptoms concerning for COVID-19 infection (fever, chills, cough, or new shortness of breath).    Past Medical History:  Diagnosis Date   Arthritis of both feet    Chronic combined systolic and diastolic CHF (congestive heart failure) ([place])    a. Echo (06/2014): Severe LVH, EF 20-25%, no RWMA, Gr 3 DD, trivial MR, mild LAE, mild RVE, PASP 38 mmHg .>> b. Echo 2/16 EF 50-55% //  // c. Echo 02/2019: EF 50-55, mod LVH, Gr 1 DD, mild LAE    CKD (chronic kidney disease), stage III (HCC)    Diabetes mellitus type 2 in obese [facility])    Diverticular disease    Fatty liver    History of GI diverticular bleed    Hyperlipidemia    Hyperphosphatemia    Hypertension    Hypertensive heart disease    a. Renal Artery Duplex (8/15):  no RAS   Iron deficiency anemia    NICM (nonischemic cardiomyopathy) ([place])    a. Nuclear (06/2014): EF 32%, apical thinning, no definite scar or ischemia;  b. Echo (2/16):  Mild LVH, EF 50-55%, Gr 2 DD, no RWMA   Obesity  Proteinuria    PUD (peptic ulcer disease)    Secondary hyperparathyroidism, renal (HCC)    Tobacco abuse    UTI (lower urinary tract infection)    Vaginal dryness, menopausal    Vaginal yeast infection    Past Surgical History:  Procedure Laterality Date   DILATION AND CURETTAGE OF UTERUS     None       Current Meds  Medication Sig   acetaminophen (TYLENOL) 500 MG tablet Take 1,000 mg by mouth every 6 (six) hours as needed for mild pain, moderate pain or  headache. For pain   allopurinol (ZYLOPRIM) 100 MG tablet Take 200 mg by mouth daily.   amLODipine (NORVASC) 5 MG tablet Take 1 tablet (5 mg total) by mouth daily.   aspirin EC 81 MG EC tablet Take 1 tablet (81 mg total) by mouth daily.   atorvastatin (LIPITOR) 40 MG tablet Take 1 tablet (40 mg total) by mouth daily at 6 PM.   B-D ULTRAFINE III SHORT PEN 31G X 8 MM MISC USE AS DIRECTED *EMERGENCY FILL*   blood glucose meter kit and supplies KIT Dispense based on patient and insurance preference. Use up to four times daily as directed. (FOR ICD-9 250.00, 250.01).   calcitRIOL (ROCALTROL) 0.25 MCG capsule Take 0.25 mcg by mouth daily.   cloNIDine (CATAPRES) 0.1 MG tablet Take 1 tablet (0.1 mg total) by mouth 2 (two) times daily.   Colchicine 0.6 MG CAPS Take by mouth every other day.    furosemide (LASIX) 40 MG tablet Take 40 mg by mouth 2 (two) times daily.   gabapentin (NEURONTIN) 100 MG capsule Take 100 mg by mouth 3 (three) times daily.   insulin aspart (NOVOLOG FLEXPEN) 100 UNIT/ML FlexPen Inject 5 Units into the skin 3 (three) times daily with meals. Additional insulin (5 units PLUS) CBG 70 - 120: 0 units CBG 121 - 150: 1 unit CBG 151 - 200: 2 units CBG 201 - 250: 3 units CBG 251 - 350: 4 units CBG 351 - 400: 6 units   insulin glargine (LANTUS) 100 UNIT/ML injection Inject into the skin daily. Inject 20 units into skin at 10pm   iron polysaccharides (NU-IRON) 150 MG capsule Take 150 mg by mouth 2 (two) times daily.    isosorbide mononitrate (IMDUR) 120 MG 24 hr tablet TAKE 1/2 TABLET DAILY = 60 MG DAILY   metoprolol tartrate (LOPRESSOR) 25 MG tablet Take 1 tablet (25 mg total) by mouth 2 (two) times daily.   ONE TOUCH ULTRA TEST test strip 1 each by Other route 4 (four) times daily.    ONETOUCH DELICA LANCETS 52W MISC Inject 33 g into the skin 4 (four) times daily.    Polyethylene Glycol 3350 (MIRALAX PO) Take by mouth daily as needed.    telmisartan (MICARDIS) 20 MG  tablet Take 1 tablet (20 mg total) by mouth daily.   [DISCONTINUED] amLODipine (NORVASC) 5 MG tablet Take 1 tablet (5 mg total) by mouth daily.   [DISCONTINUED] atorvastatin (LIPITOR) 40 MG tablet Take 1 tablet (40 mg total) by mouth daily at 6 PM.   [DISCONTINUED] cloNIDine (CATAPRES) 0.1 MG tablet Take 1 tablet (0.1 mg total) by mouth 2 (two) times daily.   [DISCONTINUED] isosorbide mononitrate (IMDUR) 120 MG 24 hr tablet TAKE 1/2 TABLET DAILY = 60 MG DAILY   [DISCONTINUED] metoprolol tartrate (LOPRESSOR) 25 MG tablet Take 1 tablet (25 mg total) by mouth 2 (two) times daily.   [DISCONTINUED] telmisartan (MICARDIS) 20 MG tablet Take 1  tablet (20 mg total) by mouth daily.     Allergies:   Hydralazine and Other   Social History   Tobacco Use   Smoking status: Former Smoker    Last attempt to quit: 07/05/2014    Years since quitting: 4.8   Smokeless tobacco: Never Used  Substance Use Topics   Alcohol use: No   Drug use: No     Family Hx: The patient's family history includes Coronary artery disease in an other family member; Diabetes in her mother; Heart attack in her paternal grandfather; Hypertension in her paternal grandmother and another family member; Kidney disease in her mother; Prostate cancer in her father; Stroke in her paternal grandmother.  ROS:   Please see the history of present illness.     All other systems reviewed and are negative.   Prior CV studies:   The following studies were reviewed today:  Echo 02/26/2019 EF 50-55, mod LVH, Gr 1 DD, normal RVSF, mild LAE  Echo (01/27/15):  Mild LVH, EF 50-55%, normal wall motion, grade 2 diastolic dysfunction   Echo (06/2014):   Severe LVH, EF 20-25%, no RWMA, Gr 3 DD, trivial MR, mild LAE, mild RVE, PASP 38 mmHg.   Nuclear (06/2014):   EF 32%, apical thinning, no definite scar or ischemia   Labs/Other Tests and Data Reviewed:    EKG:  No ECG reviewed.  Recent Labs: 10/17/2018: BUN 41; Creatinine, Ser  1.91; Hemoglobin 12.4; NT-Pro BNP 138; Platelets 279; Potassium 4.5; Sodium 139; TSH 5.780   Recent Lipid Panel Lab Results  Component Value Date/Time   CHOL 112 01/31/2017 07:51 AM   TRIG 104 01/31/2017 07:51 AM   HDL 34 (L) 01/31/2017 07:51 AM   CHOLHDL 3.3 01/31/2017 07:51 AM   CHOLHDL 3.2 12/07/2015 04:49 AM   LDLCALC 57 01/31/2017 07:51 AM    Wt Readings from Last 3 Encounters:  05/15/19 197 lb (89.4 kg)  02/14/19 204 lb (92.5 kg)  10/17/18 203 lb (92.1 kg)     Objective:    Vital Signs:  BP (!) 102/53 Comment: taken at 3:00   Pulse 67 Comment: taken at 3:00 today   Ht 5' (1.524 m)    Wt 197 lb (89.4 kg)    BMI 38.47 kg/m    VITAL SIGNS:  reviewed GEN:  no acute distress RESPIRATORY:  no labored breathing NEURO:  alert and oriented PSYCH:  she is in fair spirits  ASSESSMENT & PLAN:     Chronic heart failure with preserved ejection fraction (HCC) EF was previously as low as 20-25.  Most recent echo in March 2020 with an EF of 50-55.  Volume status is overall stable.  She takes extra Lasix about 2-3 times a month.  Continue current medical regimen.  Hypertensive heart disease with chronic combined systolic and diastolic congestive heart failure ([place]) The patient's blood pressure is controlled on her current regimen.  Continue current therapy.    CKD (chronic kidney disease), stage III ([place]) Most recent creatinine stable.  Continue follow up with Nephrology as planned.  Type 2 diabetes mellitus with stage 3 chronic kidney disease, with long-term current use of insulin ([place]) Fair control.  She recently had a Hgb A1c in the 7 range.  COVID-19 Education: The signs and symptoms of COVID-19 were discussed with the patient and how to seek care for testing (follow up with PCP or arrange E-visit).  The importance of social distancing was discussed today.  Time:   Today, I have spent 13  minutes with the patient with telehealth technology discussing the above problems.      Medication Adjustments/Labs and Tests Ordered: Current medicines are reviewed at length with the patient today.  Concerns regarding medicines are outlined above.   Tests Ordered: No orders of the defined types were placed in this encounter.   Medication Changes: Meds ordered this encounter  Medications   amLODipine (NORVASC) 5 MG tablet    Sig: Take 1 tablet (5 mg total) by mouth daily.    Dispense:  90 tablet    Refill:  3   cloNIDine (CATAPRES) 0.1 MG tablet    Sig: Take 1 tablet (0.1 mg total) by mouth 2 (two) times daily.    Dispense:  180 tablet    Refill:  3    Dose decrease   telmisartan (MICARDIS) 20 MG tablet    Sig: Take 1 tablet (20 mg total) by mouth daily.    Dispense:  90 tablet    Refill:  3   metoprolol tartrate (LOPRESSOR) 25 MG tablet    Sig: Take 1 tablet (25 mg total) by mouth 2 (two) times daily.    Dispense:  180 tablet    Refill:  3   atorvastatin (LIPITOR) 40 MG tablet    Sig: Take 1 tablet (40 mg total) by mouth daily at 6 PM.    Dispense:  90 tablet    Refill:  3   isosorbide mononitrate (IMDUR) 120 MG 24 hr tablet    Sig: TAKE 1/2 TABLET DAILY = 60 MG DAILY    Dispense:  45 tablet    Refill:  3    Disposition:  Follow up in 6 month(s)  Signed, Richardson Dopp, PA-C  05/15/2019 3:48 PM    [facility]

## 2019-05-15 ENCOUNTER — Other Ambulatory Visit: Payer: Self-pay

## 2019-05-15 ENCOUNTER — Encounter: Payer: Self-pay | Admitting: Physician Assistant

## 2019-05-15 ENCOUNTER — Telehealth (INDEPENDENT_AMBULATORY_CARE_PROVIDER_SITE_OTHER): Payer: PPO | Admitting: Physician Assistant

## 2019-05-15 VITALS — BP 102/53 | HR 67 | Ht 60.0 in | Wt 197.0 lb

## 2019-05-15 DIAGNOSIS — E1122 Type 2 diabetes mellitus with diabetic chronic kidney disease: Secondary | ICD-10-CM

## 2019-05-15 DIAGNOSIS — I5042 Chronic combined systolic (congestive) and diastolic (congestive) heart failure: Secondary | ICD-10-CM

## 2019-05-15 DIAGNOSIS — I110 Hypertensive heart disease with heart failure: Secondary | ICD-10-CM

## 2019-05-15 DIAGNOSIS — Z794 Long term (current) use of insulin: Secondary | ICD-10-CM

## 2019-05-15 DIAGNOSIS — I130 Hypertensive heart and chronic kidney disease with heart failure and stage 1 through stage 4 chronic kidney disease, or unspecified chronic kidney disease: Secondary | ICD-10-CM

## 2019-05-15 DIAGNOSIS — I5032 Chronic diastolic (congestive) heart failure: Secondary | ICD-10-CM

## 2019-05-15 DIAGNOSIS — N183 Chronic kidney disease, stage 3 unspecified: Secondary | ICD-10-CM

## 2019-05-15 DIAGNOSIS — Z7189 Other specified counseling: Secondary | ICD-10-CM

## 2019-05-15 MED ORDER — ISOSORBIDE MONONITRATE ER 120 MG PO TB24: ORAL_TABLET | ORAL | 3 refills | Status: DC

## 2019-05-15 MED ORDER — ATORVASTATIN CALCIUM 40 MG PO TABS: 40.0000 mg | ORAL_TABLET | Freq: Every day | ORAL | 3 refills | Status: DC

## 2019-05-15 MED ORDER — CLONIDINE HCL 0.1 MG PO TABS: 0.1000 mg | ORAL_TABLET | Freq: Two times a day (BID) | ORAL | 3 refills | Status: DC

## 2019-05-15 MED ORDER — METOPROLOL TARTRATE 25 MG PO TABS: 25.0000 mg | ORAL_TABLET | Freq: Two times a day (BID) | ORAL | 3 refills | Status: DC

## 2019-05-15 MED ORDER — AMLODIPINE BESYLATE 5 MG PO TABS: 5.0000 mg | ORAL_TABLET | Freq: Every day | ORAL | 3 refills | Status: DC

## 2019-05-15 MED ORDER — TELMISARTAN 20 MG PO TABS: 20.0000 mg | ORAL_TABLET | Freq: Every day | ORAL | 3 refills | Status: DC

## 2019-05-15 NOTE — Patient Instructions (Signed)
Medication Instructions:  Continue current medications.  If you need a refill on your cardiac medications before your next appointment, please call your pharmacy.   Lab work: None   If you have labs (blood work) drawn today and your tests are completely normal, you will receive your results only by: Marland Kitchen MyChart Message (if you have MyChart) OR . A paper copy in the mail If you have any lab test that is abnormal or we need to change your treatment, we will call you to review the results.  Testing/Procedures: None   Follow-Up: At [facility] you and your health needs are our priority.  As part of our continuing mission to provide you with exceptional heart care, we have created designated Provider Care Teams.  These Care Teams include your primary Cardiologist (physician) and Advanced Practice Providers (APPs -  Physician Assistants and Nurse Practitioners) who all work together to provide you with the care you need, when you need it. Richardson Dopp, PA-C in 6 months  Any Other Special Instructions Will Be Listed Below (If Applicable).

## 2019-06-04 NOTE — Telephone Encounter (Signed)
[name] Please see the note from Ms. Burger I have not seen anything.  Can you call her and see what she needs? Thanks Richardson Dopp, PA-C    06/04/2019 6:20 PM

## 2019-06-06 ENCOUNTER — Telehealth: Payer: Self-pay | Admitting: Physician Assistant

## 2019-06-06 NOTE — Telephone Encounter (Signed)
Patient needs her last to visit notes faxed to [facility], fax [phone]

## 2019-06-12 NOTE — Telephone Encounter (Signed)
Routed to [employer] at [employer] bldg.

## 2019-09-14 ENCOUNTER — Other Ambulatory Visit: Payer: Self-pay | Admitting: Internal Medicine

## 2019-09-14 DIAGNOSIS — N6320 Unspecified lump in the left breast, unspecified quadrant: Secondary | ICD-10-CM

## 2019-09-17 DIAGNOSIS — N184 Chronic kidney disease, stage 4 (severe): Secondary | ICD-10-CM | POA: Diagnosis not present

## 2019-09-17 DIAGNOSIS — N183 Chronic kidney disease, stage 3 (moderate): Secondary | ICD-10-CM | POA: Diagnosis not present

## 2019-09-17 DIAGNOSIS — I428 Other cardiomyopathies: Secondary | ICD-10-CM | POA: Diagnosis not present

## 2019-09-17 DIAGNOSIS — I129 Hypertensive chronic kidney disease with stage 1 through stage 4 chronic kidney disease, or unspecified chronic kidney disease: Secondary | ICD-10-CM | POA: Diagnosis not present

## 2019-09-17 DIAGNOSIS — N2581 Secondary hyperparathyroidism of renal origin: Secondary | ICD-10-CM | POA: Diagnosis not present

## 2019-09-17 DIAGNOSIS — D649 Anemia, unspecified: Secondary | ICD-10-CM | POA: Diagnosis not present

## 2019-09-17 DIAGNOSIS — N63 Unspecified lump in unspecified breast: Secondary | ICD-10-CM | POA: Diagnosis not present

## 2019-09-17 DIAGNOSIS — M109 Gout, unspecified: Secondary | ICD-10-CM | POA: Diagnosis not present

## 2019-09-21 ENCOUNTER — Ambulatory Visit
Admission: RE | Admit: 2019-09-21 | Discharge: 2019-09-21 | Disposition: A | Discharge status: Home / Self Care | Payer: PPO | Source: Ambulatory Visit | Attending: Internal Medicine | Admitting: Internal Medicine

## 2019-09-21 DIAGNOSIS — N6323 Unspecified lump in the left breast, lower outer quadrant: Secondary | ICD-10-CM | POA: Diagnosis not present

## 2019-09-21 DIAGNOSIS — R922 Inconclusive mammogram: Secondary | ICD-10-CM | POA: Diagnosis not present

## 2019-09-21 DIAGNOSIS — N6320 Unspecified lump in the left breast, unspecified quadrant: Secondary | ICD-10-CM

## 2019-09-26 ENCOUNTER — Other Ambulatory Visit: Payer: Self-pay | Admitting: Internal Medicine

## 2019-09-26 DIAGNOSIS — R928 Other abnormal and inconclusive findings on diagnostic imaging of breast: Secondary | ICD-10-CM

## 2019-09-26 DIAGNOSIS — N6320 Unspecified lump in the left breast, unspecified quadrant: Secondary | ICD-10-CM

## 2019-10-02 ENCOUNTER — Ambulatory Visit
Admission: RE | Admit: 2019-10-02 | Discharge: 2019-10-02 | Disposition: A | Discharge status: Home / Self Care | Payer: PPO | Source: Ambulatory Visit | Attending: Internal Medicine | Admitting: Internal Medicine

## 2019-10-02 DIAGNOSIS — R928 Other abnormal and inconclusive findings on diagnostic imaging of breast: Secondary | ICD-10-CM

## 2019-10-02 DIAGNOSIS — R590 Localized enlarged lymph nodes: Secondary | ICD-10-CM | POA: Diagnosis not present

## 2019-10-02 DIAGNOSIS — N6320 Unspecified lump in the left breast, unspecified quadrant: Secondary | ICD-10-CM | POA: Insufficient documentation

## 2019-10-02 DIAGNOSIS — C50512 Malignant neoplasm of lower-outer quadrant of left female breast: Secondary | ICD-10-CM | POA: Diagnosis not present

## 2019-10-02 DIAGNOSIS — N6323 Unspecified lump in the left breast, lower outer quadrant: Secondary | ICD-10-CM | POA: Diagnosis not present

## 2019-10-03 ENCOUNTER — Encounter: Payer: Self-pay | Admitting: *Deleted

## 2019-10-03 DIAGNOSIS — C50912 Malignant neoplasm of unspecified site of left female breast: Secondary | ICD-10-CM

## 2019-10-03 NOTE — Progress Notes (Signed)
Called and introduced patient to navigation services.  She is newly diagnosed with invasive mammary carcinoma.  States she would like to see Dr. Marlou Starks for surgical consultation and does not have a preference for medical oncology.  I have scheduled her to see Dr. Marlou Starks on 10/08/19 @ 8:40 and with Dr. Janese Banks medical oncology on 10/09/19.  I will give her educational material at the time of medical oncology consult.

## 2019-10-05 ENCOUNTER — Other Ambulatory Visit: Payer: Self-pay | Admitting: Anatomic Pathology & Clinical Pathology

## 2019-10-05 ENCOUNTER — Other Ambulatory Visit: Payer: Self-pay | Admitting: *Deleted

## 2019-10-05 LAB — SURGICAL PATHOLOGY

## 2019-10-09 ENCOUNTER — Other Ambulatory Visit: Payer: Self-pay

## 2019-10-09 ENCOUNTER — Inpatient Hospital Stay: Payer: PPO | Attending: Oncology | Admitting: Oncology

## 2019-10-09 ENCOUNTER — Inpatient Hospital Stay: Payer: PPO

## 2019-10-09 ENCOUNTER — Other Ambulatory Visit: Payer: Self-pay | Admitting: *Deleted

## 2019-10-09 VITALS — BP 119/70 | HR 65 | Temp 97.8°F | Ht 61.0 in | Wt 191.0 lb

## 2019-10-09 DIAGNOSIS — Z7982 Long term (current) use of aspirin: Secondary | ICD-10-CM | POA: Insufficient documentation

## 2019-10-09 DIAGNOSIS — Z87891 Personal history of nicotine dependence: Secondary | ICD-10-CM

## 2019-10-09 DIAGNOSIS — Z801 Family history of malignant neoplasm of trachea, bronchus and lung: Secondary | ICD-10-CM | POA: Diagnosis not present

## 2019-10-09 DIAGNOSIS — Z7189 Other specified counseling: Secondary | ICD-10-CM

## 2019-10-09 DIAGNOSIS — Z170 Estrogen receptor positive status [ER+]: Secondary | ICD-10-CM

## 2019-10-09 DIAGNOSIS — Z8043 Family history of malignant neoplasm of testis: Secondary | ICD-10-CM | POA: Diagnosis not present

## 2019-10-09 DIAGNOSIS — C50812 Malignant neoplasm of overlapping sites of left female breast: Secondary | ICD-10-CM

## 2019-10-09 DIAGNOSIS — N189 Chronic kidney disease, unspecified: Secondary | ICD-10-CM

## 2019-10-09 DIAGNOSIS — Z806 Family history of leukemia: Secondary | ICD-10-CM

## 2019-10-09 DIAGNOSIS — Z79899 Other long term (current) drug therapy: Secondary | ICD-10-CM | POA: Diagnosis not present

## 2019-10-09 DIAGNOSIS — I428 Other cardiomyopathies: Secondary | ICD-10-CM | POA: Diagnosis not present

## 2019-10-09 DIAGNOSIS — Z803 Family history of malignant neoplasm of breast: Secondary | ICD-10-CM | POA: Insufficient documentation

## 2019-10-09 DIAGNOSIS — E119 Type 2 diabetes mellitus without complications: Secondary | ICD-10-CM | POA: Insufficient documentation

## 2019-10-09 DIAGNOSIS — Z794 Long term (current) use of insulin: Secondary | ICD-10-CM | POA: Diagnosis not present

## 2019-10-09 DIAGNOSIS — I10 Essential (primary) hypertension: Secondary | ICD-10-CM | POA: Diagnosis not present

## 2019-10-09 LAB — CBC WITH DIFFERENTIAL/PLATELET
Abs Immature Granulocytes: 0.02 10*3/uL (ref 0.00–0.07)
Basophils Absolute: 0 10*3/uL (ref 0.0–0.1)
Basophils Relative: 1 %
Eosinophils Absolute: 0.3 10*3/uL (ref 0.0–0.5)
Eosinophils Relative: 5 %
HCT: 42.6 % (ref 36.0–46.0)
Hemoglobin: 13.2 g/dL (ref 12.0–15.0)
Immature Granulocytes: 0 %
Lymphocytes Relative: 18 %
Lymphs Abs: 1 10*3/uL (ref 0.7–4.0)
MCH: 26.2 pg (ref 26.0–34.0)
MCHC: 31 g/dL (ref 30.0–36.0)
MCV: 84.5 fL (ref 80.0–100.0)
Monocytes Absolute: 0.5 10*3/uL (ref 0.1–1.0)
Monocytes Relative: 9 %
Neutro Abs: 4 10*3/uL (ref 1.7–7.7)
Neutrophils Relative %: 67 %
Platelets: 250 10*3/uL (ref 150–400)
RBC: 5.04 MIL/uL (ref 3.87–5.11)
RDW: 16.3 % — ABNORMAL HIGH (ref 11.5–15.5)
WBC: 5.9 10*3/uL (ref 4.0–10.5)
nRBC: 0 % (ref 0.0–0.2)

## 2019-10-09 LAB — COMPREHENSIVE METABOLIC PANEL
ALT: 18 U/L (ref 0–44)
AST: 18 U/L (ref 15–41)
Albumin: 4.2 g/dL (ref 3.5–5.0)
Alkaline Phosphatase: 114 U/L (ref 38–126)
Anion gap: 11 (ref 5–15)
BUN: 45 mg/dL — ABNORMAL HIGH (ref 8–23)
CO2: 29 mmol/L (ref 22–32)
Calcium: 9.5 mg/dL (ref 8.9–10.3)
Chloride: 101 mmol/L (ref 98–111)
Creatinine, Ser: 1.75 mg/dL — ABNORMAL HIGH (ref 0.44–1.00)
GFR calc Af Amer: 35 mL/min — ABNORMAL LOW (ref >60)
GFR calc non Af Amer: 30 mL/min — ABNORMAL LOW (ref >60)
Glucose, Bld: 118 mg/dL — ABNORMAL HIGH (ref 70–99)
Potassium: 4.2 mmol/L (ref 3.5–5.1)
Sodium: 141 mmol/L (ref 135–145)
Total Bilirubin: 0.7 mg/dL (ref 0.3–1.2)
Total Protein: 8.2 g/dL — ABNORMAL HIGH (ref 6.5–8.1)

## 2019-10-10 ENCOUNTER — Telehealth: Payer: Self-pay | Admitting: Genetic Counselor

## 2019-10-10 ENCOUNTER — Telehealth: Payer: Self-pay | Admitting: Licensed Clinical Social Worker

## 2019-10-10 ENCOUNTER — Encounter: Payer: Self-pay | Admitting: *Deleted

## 2019-10-10 NOTE — Telephone Encounter (Signed)
Scheduled genetic counseling appointment with Clint Guy tomorrow, 57/33, at 3 pm. Patient will do virtual appointment via Webex.

## 2019-10-10 NOTE — Progress Notes (Signed)
Met patient during her medical oncology consult with Dr. Janese Banks yesterday.  Patient had not seen Dr. Marlou Starks prior to appointment.   There was a mix up with the date of the appointment.  She is scheduled for 10/12/19 @ 8:40 with him in [place].  Dr. Janese Banks discussed possible mastectomy.  She is to have a PET / or CT scan, genetic testing and breast MRI and then return to see Dr. Janese Banks for treatment planning.  Gave patient breast cancer educational literature, "My Breast Cancer Treatment Handbook" by Josephine Igo, RN.  She is to call with any questions or needs.

## 2019-10-10 NOTE — Telephone Encounter (Signed)
Called patient regarding upcoming Webex appointment, patient is notified and e-mail has been sent. °

## 2019-10-11 ENCOUNTER — Inpatient Hospital Stay: Payer: PPO | Attending: Genetic Counselor | Admitting: Genetic Counselor

## 2019-10-11 DIAGNOSIS — Z170 Estrogen receptor positive status [ER+]: Secondary | ICD-10-CM | POA: Diagnosis not present

## 2019-10-11 DIAGNOSIS — Z806 Family history of leukemia: Secondary | ICD-10-CM | POA: Diagnosis not present

## 2019-10-11 DIAGNOSIS — Z801 Family history of malignant neoplasm of trachea, bronchus and lung: Secondary | ICD-10-CM

## 2019-10-11 DIAGNOSIS — C50812 Malignant neoplasm of overlapping sites of left female breast: Secondary | ICD-10-CM | POA: Diagnosis not present

## 2019-10-11 DIAGNOSIS — Z803 Family history of malignant neoplasm of breast: Secondary | ICD-10-CM | POA: Diagnosis not present

## 2019-10-11 DIAGNOSIS — Z800 Family history of malignant neoplasm of digestive organs: Secondary | ICD-10-CM

## 2019-10-11 DIAGNOSIS — Z8043 Family history of malignant neoplasm of testis: Secondary | ICD-10-CM | POA: Diagnosis not present

## 2019-10-12 ENCOUNTER — Encounter: Payer: Self-pay | Admitting: Genetic Counselor

## 2019-10-12 ENCOUNTER — Telehealth: Payer: Self-pay | Admitting: *Deleted

## 2019-10-12 DIAGNOSIS — Z800 Family history of malignant neoplasm of digestive organs: Secondary | ICD-10-CM | POA: Insufficient documentation

## 2019-10-12 DIAGNOSIS — Z806 Family history of leukemia: Secondary | ICD-10-CM | POA: Insufficient documentation

## 2019-10-12 DIAGNOSIS — Z170 Estrogen receptor positive status [ER+]: Secondary | ICD-10-CM | POA: Diagnosis not present

## 2019-10-12 DIAGNOSIS — Z8043 Family history of malignant neoplasm of testis: Secondary | ICD-10-CM | POA: Insufficient documentation

## 2019-10-12 DIAGNOSIS — Z801 Family history of malignant neoplasm of trachea, bronchus and lung: Secondary | ICD-10-CM | POA: Insufficient documentation

## 2019-10-12 DIAGNOSIS — Z803 Family history of malignant neoplasm of breast: Secondary | ICD-10-CM | POA: Insufficient documentation

## 2019-10-12 DIAGNOSIS — C50512 Malignant neoplasm of lower-outer quadrant of left female breast: Secondary | ICD-10-CM | POA: Diagnosis not present

## 2019-10-12 NOTE — Telephone Encounter (Signed)
Pt has been scheduled for surgery clearance appt with Pecolia Ades, NP on 11/4 @ 3:30 pm. I will route clearance to Pecolia Ades, NP for appt. I will remove from the pre op call back pool. I will route to the surgeon's office as FYI about appt.

## 2019-10-12 NOTE — Telephone Encounter (Signed)
   [facility] Pre-operative Risk Assessment    Request for surgical clearance:  1. What type of surgery is being performed? LEFT MASTECTOMY   2. When is this surgery scheduled? TBD   3. What type of clearance is required (medical clearance vs. Pharmacy clearance to hold med vs. Both)? MEDICAL  4. Are there any medications that need to be held prior to surgery and how long? ASA    5. Practice name and name of physician performing surgery? [facility]; DR. PAUL TOTH   6. What is your office phone number [phone]    7.   What is your office fax number [phone]  8.   Anesthesia type (None, local, MAC, general) ? GENERAL   [name] 10/12/2019, 3:54 PM  _________________________________________________________________   (provider comments below)

## 2019-10-12 NOTE — Telephone Encounter (Signed)
   Primary Cardiologist:James Hochrein, MD  Chart reviewed as part of pre-operative protocol coverage. Because of Nikola Kochel's past medical history and time since last visit, he/she will require a follow-up visit in order to better assess preoperative cardiovascular risk. Pt has a recall in to see Richardson Dopp, PA-C on 11/11/2019. I would recommend that she proceed with an appointment given the extent of her surgery and time since she was last seen.   Pre-op covering staff: - Please schedule appointment and call patient to inform them. - Please contact requesting surgeon's office via preferred method (i.e, phone, fax) to inform them of need for appointment prior to surgery.  If applicable, this message will also be routed to pharmacy pool and/or primary cardiologist for input on holding anticoagulant/antiplatelet agent as requested below so that this information is available at time of patient's appointment.   Kathyrn Drown, NP  10/12/2019, 4:13 PM

## 2019-10-12 NOTE — Progress Notes (Signed)
REFERRING PROVIDER: [name], MD [place],  [name] [id number]  PRIMARY PROVIDER:  [name], MD  PRIMARY REASON FOR VISIT:  1. Malignant neoplasm of overlapping sites of left breast in female, estrogen receptor positive ([place])   2. Family history of breast cancer   3. Family history of testicular cancer   4. Family history of lung cancer   5. Family history of throat cancer   6. Family history of leukemia      I connected with [name] on 10/12/2019 at 3:00pm EDT by Webex video conference and verified that I am speaking with the correct person using two identifiers.   Patient location: home Provider location: office  HISTORY OF PRESENT ILLNESS:   [name], a 63 y.o. female, was seen for a [facility] cancer genetics consultation at the request of Dr. Janese Banks due to a personal and family history of breast cancer.  [name] presents to clinic today to discuss the possibility of a hereditary predisposition to cancer, genetic testing, and to further clarify her future cancer risks, as well as potential cancer risks for family members.   In 2020, at the age of 44, [name] was diagnosed with invasive mammary carcinoma, ER+/PR+/Her2-, of the left breast.   CANCER HISTORY:  Oncology History   No history exists.     RISK FACTORS:  Menarche was at age 64.  First live birth at age 8.  OCP use for approximately 13 years.  Ovaries intact: yes.  Hysterectomy: no.  Menopausal status: postmenopausal - age 74.  HRT use: 0 years - vaginal cream for 5-6 months. Colonoscopy: yes; 2 colonoscopies - diverticulitis. Mammogram within the last year: yes. Number of breast biopsies: 2. Any excessive radiation exposure in the past: no  Past Medical History:  Diagnosis Date  . Arthritis of both feet   . Chronic combined systolic and diastolic CHF (congestive heart failure) ([place])    a. Echo (06/2014): Severe LVH, EF 20-25%, no RWMA, Gr 3 DD, trivial MR, mild LAE, mild RVE,  PASP 38 mmHg .>> b. Echo 2/16 EF 50-55% //  // c. Echo 02/2019: EF 50-55, mod LVH, Gr 1 DD, mild LAE   . CKD (chronic kidney disease), stage III   . Diabetes mellitus type 2 in obese ([place])   . Diverticular disease   . Family history of breast cancer   . Family history of leukemia   . Family history of lung cancer   . Family history of testicular cancer   . Family history of throat cancer   . Fatty liver   . History of GI diverticular bleed   . Hyperlipidemia   . Hyperphosphatemia   . Hypertension   . Hypertensive heart disease    a. Renal Artery Duplex (8/15):  no RAS  . Iron deficiency anemia   . NICM (nonischemic cardiomyopathy) ([place])    a. Nuclear (06/2014): EF 32%, apical thinning, no definite scar or ischemia;  b. Echo (2/16):  Mild LVH, EF 50-55%, Gr 2 DD, no RWMA  . Obesity   . Proteinuria   . PUD (peptic ulcer disease)   . Secondary hyperparathyroidism, renal ([place])   . Tobacco abuse   . UTI (lower urinary tract infection)   . Vaginal dryness, menopausal   . Vaginal yeast infection     Past Surgical History:  Procedure Laterality Date  . DILATION AND CURETTAGE OF UTERUS    . None      Social History  Socioeconomic History  . Marital status: Married    Spouse name: Not on file  . Number of children: 2  . Years of education: 7  . Highest education level: Not on file  Occupational History  . Not on file  Social Needs  . Financial resource strain: Not on file  . Food insecurity    Worry: Not on file    Inability: Not on file  . Transportation needs    Medical: Not on file    Non-medical: Not on file  Tobacco Use  . Smoking status: Former Smoker    Quit date: 07/05/2014    Years since quitting: 5.2  . Smokeless tobacco: Never Used  Substance and Sexual Activity  . Alcohol use: No  . Drug use: No  . Sexual activity: Not on file  Lifestyle  . Physical activity    Days per week: Not on file    Minutes per session: Not on file  . Stress: Not on file   Relationships  . Social Herbalist on phone: Not on file    Gets together: Not on file    Attends religious service: Not on file    Active member of club or organization: Not on file    Attends meetings of clubs or organizations: Not on file    Relationship status: Not on file  Other Topics Concern  . Not on file  Social History Narrative   Currently lives in a house with her husband.    Fun: Watch TV.   Denies religious beliefs effecting health care.      FAMILY HISTORY:  We obtained a detailed, 4-generation family history.  Significant diagnoses are listed below: Family History  Problem Relation Age of Onset  . Heart attack Paternal Grandfather   . Stroke Paternal Grandmother   . Hypertension Paternal Grandmother   . Kidney disease Mother   . Diabetes Mother   . Testicular cancer Father        diagnosed older than 48  . Coronary artery disease Other   . Hypertension Other   . Breast cancer Sister        diagnosed late 27s, negative genetics  . Leukemia Brother        chronic  . Throat cancer Brother   . Pneumonia Sister 2  . Lung cancer Paternal Uncle        diagnosed over 13   [name] has two sons, ages 47 and 63. She also has three sisters and five brothers. One sister died at age 42 from pneumonia, and one brother died at age 75 from a car accident. Another sister had breast cancer diagnosed last year when she was in her late 69s, and was treated with a double mastectomy, although genetic testing was negative, per [name]. She also has a brother who is 68 who has had leukemia diagnosed for the past several years as well as throat cancer.   [name] is unsure how old her mother was when she died, although she believes she was older than 63 and did not have cancer. She had one maternal aunt, although she does not have any information about this aunt. She also does not have any information on her maternal grandparents regarding how old they were when they died  and if they ever had cancer.  [name] father died older than 74 from testicular cancer. She had two paternal aunts and five paternal uncles. One of her uncles had lung  cancer. Her paternal grandmother died at age 69 from a stroke, and her paternal grandfather died at age 22 from a heart attack. There are no other known diagnoses of cancer on the paternal side of the family.  Ms. Giacobbe is aware of previous family history of genetic testing for hereditary cancer risks - negative genetic testing for her sister's breast cancer. Her maternal ancestors are of unknown descent, and paternal ancestors are of Native [place] descent. There is no reported Ashkenazi Jewish ancestry. There is no known consanguinity.  GENETIC COUNSELING ASSESSMENT: Ms. Eble is a 63 y.o. female with a personal and family history of breast cancer and genetic testing which is not highly suggestive of a hereditary cancer syndrome. We, therefore, discussed and recommended the following at today's visit.   DISCUSSION: We discussed that 5 - 10% of breast cancer is hereditary, with most cases associated with BRCA1/2.  There are other genes that can be associated with hereditary breast cancer syndromes.  These include ATM, CHEK2, PALB2, etc.  We discussed that testing and identifying a hereditary cancer syndrome is beneficial for several reasons including knowing about other cancer risks, identifying potential screening and risk-reduction options that may be appropriate, and to understand if other family members could be at risk for cancer and allow them to undergo genetic testing.  We reviewed the characteristics, features and inheritance patterns of hereditary cancer syndromes. We also discussed genetic testing, including the appropriate family members to test, the process of testing, and insurance coverage. We discussed with Ms. Bourque that, considering her sister's negative genetic test results, the personal and family history does  not meet insurance or NCCN criteria for genetic testing and is not highly consistent with a familial hereditary cancer syndrome.  We feel she is at low risk to harbor a gene mutation associated with such a condition. Thus, we did not recommend any genetic testing, at this time, and recommended Ms. Suminski continue to follow the cancer screening guidelines given by her primary healthcare provider.  Ms. Summons is still interested in potentially pursuing genetic testing in the future.  Ms. Birnbaum also expressed interest and concern regarding hereditary testicular cancer risks, given her father's diagnosis and the fact that she has two sons. We discussed that testicular cancer is rarely hereditary but can be familial, although this is uncommon. Her sons may be at a somewhat increased risk for testicular cancer given the family history, although the risk likely remains relatively low given that the lifetime risk of testicular cancer in men is approximately 0.4%  PLAN:  Ms. Bernardy did not wish to pursue genetic testing at today's visit. We understand this decision and remain available to coordinate genetic testing at any time in the future. We, therefore, recommend Ms. Towner continue to follow the cancer screening guidelines given by her primary healthcare provider.  Lastly, we encouraged Ms. Sinning to remain in contact with cancer genetics annually so that we can continuously update the family history and inform her of any changes in cancer genetics and testing that may be of benefit for this family.   Ms. Waldo questions were answered to her satisfaction today. Our contact information was provided should additional questions or concerns arise. Thank you for the referral and allowing [place] to share in the care of your patient.   Clint Guy, MS, [facility] Certified Genetic Counselor [email or web address] Phone: [phone]  The patient was seen for a total of 35 minutes in face-to-face genetic counseling.  This  patient was discussed  with Drs. Magrinat, Lindi Adie and/or Burr Medico who agrees with the above.    _______________________________________________________________________ For Office Staff:  Number of people involved in session: 1 Was an Intern/ student involved with case: no

## 2019-10-14 ENCOUNTER — Encounter: Payer: Self-pay | Admitting: Oncology

## 2019-10-14 DIAGNOSIS — Z7189 Other specified counseling: Secondary | ICD-10-CM | POA: Insufficient documentation

## 2019-10-14 NOTE — Progress Notes (Signed)
Hematology/Oncology Consult note [facility] [phone] [phone]  Patient Care Team: [name], MD as PCP - General (Internal Medicine) Minus Breeding, MD as PCP - Cardiology (Cardiology) Jacelyn Pi, MD as Referring Physician (Endocrinology) Lahoma Rocker, MD as Consulting Physician (Rheumatology)   Name of the patient: [name]  [id number]  [birth year]    Reason for referral- new diagnosis of breast cancer   Referring physician- Dr. Lavera Guise  Date of visit: 10/14/19   History of presenting illness- Patient is a 63 year old female with a past medical history significant for hypertension, CKD, diabetes, nonischemic cardiomyopathy among other medical problems.  She has not undergone a mammogram for several years.  Mammogram on October 2020 was done after she had a palpable area of concern in the left breast which showed an irregular mass with increased vascularity measuring 3.1 x 2.4 x 4.5 cm.  The nodularity extended towards the nipple measuring at least 5.4 cm.  Single abnormal lymph node in the left axilla with cortical thickness of 0.7 cm.  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  Patient has been referred to [place] for further management.  She was supposed to see Dr. Marlou Starks yesterday but said that she could not find his office clinically and will be rescheduled to see him next week.  Patient has baseline arthritis and ambulates with the help of a cane. family history significant for breast cancer in her sister in her 34s and her sister did undergo genetic o work-up which was negative.  ECOG PS- 1  Pain scale- 0   Review of systems- Review of Systems  Constitutional: Positive for malaise/fatigue. Negative for chills, fever and weight loss.  HENT: Negative for congestion, ear  discharge and nosebleeds.   Eyes: Negative for blurred vision.  Respiratory: Negative for cough, hemoptysis, sputum production, shortness of breath and wheezing.   Cardiovascular: Negative for chest pain, palpitations, orthopnea and claudication.  Gastrointestinal: Negative for abdominal pain, blood in stool, constipation, diarrhea, heartburn, melena, nausea and vomiting.  Genitourinary: Negative for dysuria, flank pain, frequency, hematuria and urgency.  Musculoskeletal: Positive for joint pain. Negative for back pain and myalgias.  Skin: Negative for rash.  Neurological: Negative for dizziness, tingling, focal weakness, seizures, weakness and headaches.  Endo/Heme/Allergies: Does not bruise/bleed easily.  Psychiatric/Behavioral: Negative for depression and suicidal ideas. The patient does not have insomnia.     Allergies  Allergen Reactions   Hydralazine Swelling   Other Other (See Comments)    Unknown blood pressure medication caused face swelling    Patient Active Problem List   Diagnosis Date Noted   Family history of breast cancer    Family history of testicular cancer    Family history of lung cancer    Family history of throat cancer    Family history of leukemia    Malignant neoplasm of overlapping sites of left breast in female, estrogen receptor positive ([place]) 10/09/2019   Type 2 diabetes mellitus with stage 3 chronic kidney disease, with long-term current use of insulin ([place]) 01/31/2018   Pancreatitis, acute 12/06/2015   ARF (acute renal failure) ([place]) 12/05/2015   Rectal bleeding 12/05/2015   Elevated troponin 12/05/2015   Abdominal distention    DKA (diabetic ketoacidoses) ([place]) 12/04/2015   Rash 03/26/2015   Elevated alkaline phosphatase level 03/26/2015   NICM (nonischemic cardiomyopathy) ([place])  08/12/2014   Chronic diastolic CHF (congestive heart failure) ([place]) 07/07/2014   NSTEMI (non-ST elevated myocardial infarction) ([place]) 07/07/2014    Hypertensive heart disease with CHF (congestive heart failure) ([place]) 07/07/2014   CKD (chronic kidney disease) stage 3, GFR 30-59 ml/min (HCC) 07/07/2014   Non compliance w medication regimen secondary to "allergies" and cost 07/07/2014   PUD (peptic ulcer disease)- endoscopy 2012 07/07/2014   GI bleed 10/01/2012   Diverticulitis- lower GI bleed 2013 10/01/2012     Past Medical History:  Diagnosis Date   Arthritis of both feet    Chronic combined systolic and diastolic CHF (congestive heart failure) ([place])    a. Echo (06/2014): Severe LVH, EF 20-25%, no RWMA, Gr 3 DD, trivial MR, mild LAE, mild RVE, PASP 38 mmHg .>> b. Echo 2/16 EF 50-55% //  // c. Echo 02/2019: EF 50-55, mod LVH, Gr 1 DD, mild LAE    CKD (chronic kidney disease), stage III    Diabetes mellitus type 2 in obese [facility])    Diverticular disease    Family history of breast cancer    Family history of leukemia    Family history of lung cancer    Family history of testicular cancer    Family history of throat cancer    Fatty liver    History of GI diverticular bleed    Hyperlipidemia    Hyperphosphatemia    Hypertension    Hypertensive heart disease    a. Renal Artery Duplex (8/15):  no RAS   Iron deficiency anemia    NICM (nonischemic cardiomyopathy) ([place])    a. Nuclear (06/2014): EF 32%, apical thinning, no definite scar or ischemia;  b. Echo (2/16):  Mild LVH, EF 50-55%, Gr 2 DD, no RWMA   Obesity    Proteinuria    PUD (peptic ulcer disease)    Secondary hyperparathyroidism, renal (HCC)    Tobacco abuse    UTI (lower urinary tract infection)    Vaginal dryness, menopausal    Vaginal yeast infection      Past Surgical History:  Procedure Laterality Date   DILATION AND CURETTAGE OF UTERUS     None      Social History   Socioeconomic History   Marital status: Married    Spouse name: Not on file   Number of children: 2   Years of education: 7   Highest education level:  Not on file  Occupational History   Not on file  Social Needs   Financial resource strain: Not on file   Food insecurity    Worry: Not on file    Inability: Not on file   Transportation needs    Medical: Not on file    Non-medical: Not on file  Tobacco Use   Smoking status: Former Smoker    Quit date: 07/05/2014    Years since quitting: 5.2   Smokeless tobacco: Never Used  Substance and Sexual Activity   Alcohol use: No   Drug use: No   Sexual activity: Not on file  Lifestyle   Physical activity    Days per week: Not on file    Minutes per session: Not on file   Stress: Not on file  Relationships   Social connections    Talks on phone: Not on file    Gets together: Not on file    Attends religious service: Not on file    Active member of club or organization: Not on file    Attends meetings  of clubs or organizations: Not on file    Relationship status: Not on file   Intimate partner violence    Fear of current or ex partner: Not on file    Emotionally abused: Not on file    Physically abused: Not on file    Forced sexual activity: Not on file  Other Topics Concern   Not on file  Social History Narrative   Currently lives in a house with her husband.    Fun: Watch TV.   Denies religious beliefs effecting health care.      Family History  Problem Relation Age of Onset   Heart attack Paternal Grandfather    Stroke Paternal Grandmother    Hypertension Paternal Grandmother    Kidney disease Mother    Diabetes Mother    Testicular cancer Father        diagnosed older than 57   Coronary artery disease Other    Hypertension Other    Breast cancer Sister        diagnosed late 71s, negative genetics   Leukemia Brother        chronic   Throat cancer Brother    Pneumonia Sister 2   Lung cancer Paternal Uncle        diagnosed over 65     Current Outpatient Medications:    acetaminophen (TYLENOL) 500 MG tablet, Take 1,000 mg by mouth  every 6 (six) hours as needed for mild pain, moderate pain or headache. For pain, Disp: , Rfl:    allopurinol (ZYLOPRIM) 100 MG tablet, Take 200 mg by mouth daily., Disp: , Rfl:    amLODipine (NORVASC) 5 MG tablet, Take 1 tablet (5 mg total) by mouth daily., Disp: 90 tablet, Rfl: 3   aspirin EC 81 MG EC tablet, Take 1 tablet (81 mg total) by mouth daily., Disp: , Rfl:    atorvastatin (LIPITOR) 40 MG tablet, Take 1 tablet (40 mg total) by mouth daily at 6 PM., Disp: 90 tablet, Rfl: 3   B-D ULTRAFINE III SHORT PEN 31G X 8 MM MISC, USE AS DIRECTED *EMERGENCY FILL*, Disp: , Rfl: 0   blood glucose meter kit and supplies KIT, Dispense based on patient and insurance preference. Use up to four times daily as directed. (FOR ICD-9 250.00, 250.01)., Disp: 1 each, Rfl: 0   calcitRIOL (ROCALTROL) 0.25 MCG capsule, Take 0.25 mcg by mouth daily., Disp: , Rfl:    cloNIDine (CATAPRES) 0.1 MG tablet, Take 1 tablet (0.1 mg total) by mouth 2 (two) times daily., Disp: 180 tablet, Rfl: 3   Colchicine 0.6 MG CAPS, Take by mouth every other day. , Disp: , Rfl:    furosemide (LASIX) 40 MG tablet, Take 40 mg by mouth 2 (two) times daily., Disp: , Rfl:    gabapentin (NEURONTIN) 100 MG capsule, Take 100 mg by mouth 3 (three) times daily., Disp: , Rfl:    insulin aspart (NOVOLOG FLEXPEN) 100 UNIT/ML FlexPen, Inject 5 Units into the skin 3 (three) times daily with meals. Additional insulin (5 units PLUS) CBG 70 - 120: 0 units CBG 121 - 150: 1 unit CBG 151 - 200: 2 units CBG 201 - 250: 3 units CBG 251 - 350: 4 units CBG 351 - 400: 6 units, Disp: 15 mL, Rfl: 11   insulin glargine (LANTUS) 100 UNIT/ML injection, Inject into the skin daily. Inject 20 units into skin at 10pm, Disp: , Rfl:    iron polysaccharides (NU-IRON) 150 MG capsule, Take 150 mg by mouth  2 (two) times daily. , Disp: , Rfl:    isosorbide mononitrate (IMDUR) 120 MG 24 hr tablet, TAKE 1/2 TABLET DAILY = 60 MG DAILY, Disp: 45 tablet, Rfl: 3    metoprolol tartrate (LOPRESSOR) 25 MG tablet, Take 1 tablet (25 mg total) by mouth 2 (two) times daily., Disp: 180 tablet, Rfl: 3   Polyethylene Glycol 3350 (MIRALAX PO), Take by mouth daily as needed. , Disp: , Rfl:    telmisartan (MICARDIS) 20 MG tablet, Take 1 tablet (20 mg total) by mouth daily., Disp: 90 tablet, Rfl: 3   ONE TOUCH ULTRA TEST test strip, 1 each by Other route 4 (four) times daily. , Disp: , Rfl: 1   ONETOUCH DELICA LANCETS 08Q MISC, Inject 33 g into the skin 4 (four) times daily. , Disp: , Rfl: 1   Physical exam:  Vitals:   10/09/19 1519  BP: 119/70  Pulse: 65  Temp: 97.8 F (36.6 C)  TempSrc: Tympanic  Weight: 191 lb (86.6 kg)  Height: _0  (1.549 m)   Physical Exam Constitutional:      Appearance: She is obese.     Comments: Ambulates with a cane  HENT:     Head: Normocephalic and atraumatic.  Eyes:     Pupils: Pupils are equal, round, and reactive to light.  Neck:     Musculoskeletal: Normal range of motion.  Cardiovascular:     Rate and Rhythm: Normal rate and regular rhythm.     Heart sounds: Normal heart sounds.  Pulmonary:     Effort: Pulmonary effort is normal.     Breath sounds: Normal breath sounds.  Abdominal:     General: Bowel sounds are normal.     Palpations: Abdomen is soft.  Skin:    General: Skin is warm and dry.  Neurological:     Mental Status: She is alert and oriented to person, place, and time.   Breast exam performed in sitting and lying down position.  No evidence of palpable bilateral axillary adenopathy.  Palpable left breast mass measuring about 4 cm.  Mass appears to be tethered to the skin and there is associated left nipple inversion    CMP Latest Ref Rng & Units 10/09/2019  Glucose 70 - 99 mg/dL 118(H)  BUN 8 - 23 mg/dL 45(H)  Creatinine 0.44 - 1.00 mg/dL 1.75(H)  Sodium 135 - 145 mmol/L 141  Potassium 3.5 - 5.1 mmol/L 4.2  Chloride 98 - 111 mmol/L 101  CO2 22 - 32 mmol/L 29  Calcium 8.9 - 10.3 mg/dL 9.5    Total Protein 6.5 - 8.1 g/dL 8.2(H)  Total Bilirubin 0.3 - 1.2 mg/dL 0.7  Alkaline Phos 38 - 126 U/L 114  AST 15 - 41 U/L 18  ALT 0 - 44 U/L 18   CBC Latest Ref Rng & Units 10/09/2019  WBC 4.0 - 10.5 K/uL 5.9  Hemoglobin 12.0 - 15.0 g/dL 13.2  Hematocrit 36.0 - 46.0 % 42.6  Platelets 150 - 400 K/uL 250    No images are attached to the encounter.  US Breast Ltd Uni Left Inc Axilla  Result Date: 09/21/2019 CLINICAL DATA:  63 year old female with a palpable area of concern in the left breast she has been feeling since January. EXAM: DIGITAL DIAGNOSTIC BILATERAL MAMMOGRAM WITH CAD AND TOMO LEFT BREAST ULTRASOUND COMPARISON:  None. ACR Breast Density Category c: The breast tissue is heterogeneously dense, which may obscure small masses. FINDINGS: No suspicious masses or calcifications are seen in the right  breast. There is an irregular spiculated mass corresponding to the site of palpable concern in the lower outer left breast as well as adjacent nodularity just lateral to the dominant mass all together measuring measuring approximately 4 cm. The left nipple is inverted. Calcifications are seen associated with the mass. Additional initially questioned calcifications seen anterior to the mass in the left breast on the MLO view appears scattered and punctate and round on the spot compression magnification CC view and located within the medial left breast. Mammographic images were processed with CAD. Physical examination reveals a large firm fixed mass involving the outer left breast with associated tethering of the skin and nipple inversion. Targeted ultrasound of the outer left breast was performed. There is an irregular mass with increased vascularity at the 3:30 position 3-4 cm from nipple with the dominant portion measuring 3.1 x 2.4 x 4.5 cm. This mass/nodularity extends towards the nipple measuring at least 5.4 cm. A single abnormal lymph node is seen in the left axilla with a cortical thickness of  0.7 cm. IMPRESSION: 1. Dominant highly suspicious mass in the outer left breast with nodularity (all connected) extending toward the nipple, with both mass in nodularity measuring at least 5.4 cm. 2.  Suspicious lymph node in the left axilla. 3.  No mammographic evidence of malignancy in the right breast. RECOMMENDATION: 1. Ultrasound-guided biopsy of the palpable mass in the outer left breast. Suggest biopsying 2 sites; biopsy of the nodularity closest to the nipple and biopsy of the more dominant portion of the mass located furthest from the nipple to establish extent of disease. 2. Recommend ultrasound-guided biopsy of the abnormal lymph node in the left axilla. I have discussed the findings and recommendations with the patient. If applicable, a reminder letter will be sent to the patient regarding the next appointment. BI-RADS CATEGORY  5: Highly suggestive of malignancy. Electronically Signed   By: Everlean Alstrom M.D.   On: 09/21/2019 16:57   Mm Diag Breast Tomo Bilateral  Result Date: 09/21/2019 CLINICAL DATA:  63 year old female with a palpable area of concern in the left breast she has been feeling since January. EXAM: DIGITAL DIAGNOSTIC BILATERAL MAMMOGRAM WITH CAD AND TOMO LEFT BREAST ULTRASOUND COMPARISON:  None. ACR Breast Density Category c: The breast tissue is heterogeneously dense, which may obscure small masses. FINDINGS: No suspicious masses or calcifications are seen in the right breast. There is an irregular spiculated mass corresponding to the site of palpable concern in the lower outer left breast as well as adjacent nodularity just lateral to the dominant mass all together measuring measuring approximately 4 cm. The left nipple is inverted. Calcifications are seen associated with the mass. Additional initially questioned calcifications seen anterior to the mass in the left breast on the MLO view appears scattered and punctate and round on the spot compression magnification CC view and  located within the medial left breast. Mammographic images were processed with CAD. Physical examination reveals a large firm fixed mass involving the outer left breast with associated tethering of the skin and nipple inversion. Targeted ultrasound of the outer left breast was performed. There is an irregular mass with increased vascularity at the 3:30 position 3-4 cm from nipple with the dominant portion measuring 3.1 x 2.4 x 4.5 cm. This mass/nodularity extends towards the nipple measuring at least 5.4 cm. A single abnormal lymph node is seen in the left axilla with a cortical thickness of 0.7 cm. IMPRESSION: 1. Dominant highly suspicious mass in the outer left  breast with nodularity (all connected) extending toward the nipple, with both mass in nodularity measuring at least 5.4 cm. 2.  Suspicious lymph node in the left axilla. 3.  No mammographic evidence of malignancy in the right breast. RECOMMENDATION: 1. Ultrasound-guided biopsy of the palpable mass in the outer left breast. Suggest biopsying 2 sites; biopsy of the nodularity closest to the nipple and biopsy of the more dominant portion of the mass located furthest from the nipple to establish extent of disease. 2. Recommend ultrasound-guided biopsy of the abnormal lymph node in the left axilla. I have discussed the findings and recommendations with the patient. If applicable, a reminder letter will be sent to the patient regarding the next appointment. BI-RADS CATEGORY  5: Highly suggestive of malignancy. Electronically Signed   By: Everlean Alstrom M.D.   On: 09/21/2019 16:57   Mm Clip Placement Left  Result Date: 10/02/2019 CLINICAL DATA:  Evaluate placement of biopsy clips following ultrasound-guided LEFT breast and axillary biopsies. EXAM: DIAGNOSTIC LEFT MAMMOGRAM POST ULTRASOUND BIOPSY COMPARISON:  Previous exam(s). FINDINGS: Mammographic images were obtained following ultrasound-guided biopsies of anterior and posterior aspects of OUTER LEFT  breast mass(es) and abnormal LEFT axillary lymph node. The COIL clip is in satisfactory position at the biopsy site of anterior aspect of nodularity/mass at the 3:30 position of the LEFT breast. The RIBBON clip is in satisfactory position at the biopsy site of the posterolateral aspect of the OUTER LEFT breast mass/nodularity. Please note that the RIBBON clip is located 2 cm LATERAL/SUPERIOR to the dominant irregular mass. The RIBBON clip and the COIL clip are separated by a distance of 7.5 cm. [facility] clip is in satisfactory position at the site of LEFT axillary lymph node biopsy. IMPRESSION: Satisfactory placement of COIL clip and RIBBON clip at the biopsy sites of the anterior and posterior aspects of LOWER OUTER LEFT breast mass/nodularity. The 2 clips are separated by a distance of 7.5 cm. Please note that the RIBBON clip is located 2 cm LATERAL/SUPERIOR to the dominant irregular mass identified mammographically, but is in satisfactory position at the biopsy site of small hypoechoic mass/nodularity. HydroMARK clip in satisfactory position at the site of LEFT axillary lymph node biopsy. Final Assessment: Post Procedure Mammograms for Marker Placement Electronically Signed   By: Margarette Canada M.D.   On: 10/02/2019 10:01   [place] Lt Breast Bx W Loc Dev 1st Lesion Img Bx Spec US Guide  Addendum Date: 10/03/2019   ADDENDUM REPORT: 10/03/2019 12:30 ADDENDUM: PATHOLOGY revealed: A. BREAST, LEFT AT 330 O'CLOCK, 3 CM FROM THE NIPPLE (ANTERIOR); - INVASIVE MAMMARY CARCINOMA, WITH LOBULAR FEATURES. 5 mm in this sample. Grade 2. Ductal carcinoma in situ: Not identified. Lymphovascular invasion: Not identified. B. BREAST, LEFT AT 330 O'CLOCK, 3 CM FROM THE NIPPLE (POSTERIOR/LATERAL); ULTRASOUND-GUIDED CORE BIOPSY: - INVASIVE MAMMARY CARCINOMA, WITH LOBULAR FEATURES. 14 mm in this sample. Grade 2. Ductal carcinoma in situ: Not identified. Lymphovascular invasion: Not identified. C. AXILLA, LEFT; ULTRASOUND-GUIDED CORE  BIOPSY: - MACROMETASTATIC MAMMARY CARCINOMA WITHIN LYMPH NODE, MEASURING AT LEAST 7 MM IN GREATEST EXTENT, INVOLVING 3 OF 3 TISSUE CORES. Pathology results are CONCORDANT with imaging findings, per Dr. Hassan Rowan. Pathology results were discussed with patient via telephone. The patient reported doing well after the biopsy with no adverse symptoms, only tenderness at the site. Post biopsy care instructions were reviewed and questions were answered. The patient was encouraged to call [facility] for any additional questions or concerns. Recommendation: If breast conservation is desired, recommend  bilateral breast MRI without and with contrast given lobular features. Surgical referral. Request for surgical referral was relayed to nurse navigators at [facility] by Electa Sniff RN on 10/14 2020. Addendum by Electa Sniff RN on 10/03/2019. Electronically Signed   By: Margarette Canada M.D.   On: 10/03/2019 12:30   Result Date: 10/03/2019 CLINICAL DATA:  63 year old female for tissue sampling of anterior and posterior aspect of LOWER OUTER LEFT breast mass/nodularity and enlarged LEFT axillary lymph node. EXAM: ULTRASOUND GUIDED LEFT BREAST CORE NEEDLE BIOPSY X 2 ULTRASOUND-GUIDED CORE BIOPSY OF A LEFT AXILLARY LYMPH NODE COMPARISON:  Previous exam(s). FINDINGS: I met with the patient and we discussed the procedure of ultrasound-guided biopsies, including benefits and alternatives. We discussed the high likelihood of successful procedures. We discussed the risks of the procedures, including infection, bleeding, tissue injury, clip migration, and inadequate sampling. Informed written consent was given. The usual time-out protocol was performed immediately prior to the procedures. ULTRASOUND-GUIDED LEFT BREAST BIOPSY #1 (anterior aspect of LOWER OUTER LEFT breast mass/nodularity-COIL clip): Lesion quadrant: LOWER OUTER Using sterile technique and 1% Lidocaine as local anesthetic, under direct ultrasound  visualization, a 14 gauge spring-loaded device was used to perform biopsy of the anterior aspect of the irregular mass/nodularity at the 3:30 position of the LEFT breast using a SUPERIOR approach. At the conclusion of the procedure COIL tissue marker clip was deployed into the biopsy cavity. Follow up 2 view mammogram was performed and dictated separately. ULTRASOUND-GUIDED LEFT BREAST BIOPSY #2 (posterolateral aspect of LOWER OUTER LEFT breast mass-RIBBON clip): Lesion quadrant: LOWER OUTER Using sterile technique and 1% Lidocaine as local anesthetic, under direct ultrasound visualization, a 14 gauge spring-loaded device was used to perform biopsy of the posterolateral aspect of the sonographic nodularity/small masses at the 3:30 position of the LEFT breast using a MEDIAL approach. At the conclusion of the procedure RIBBON tissue marker clip was deployed into the biopsy cavity. Follow up 2 view mammogram was performed and dictated separately. ULTRASOUND-GUIDED CORE BIOPSY OF A LEFT AXILLARY LYMPH NODE: Using sterile technique and 1% Lidocaine as local anesthetic, under direct ultrasound visualization, a 14 gauge spring-loaded device was used to perform biopsy of the LEFT axillary lymph node with cortical thickening using a MEDIAL approach. At the conclusion of the procedure [facility] tissue marker clip was deployed into the biopsy cavity. Follow up 2 view mammogram was performed and dictated separately. IMPRESSION: Ultrasound guided biopsy of anterior and posterolateral aspects of irregular LOWER OUTER LEFT breast mass/nodularity and an abnormal LEFT axillary lymph node. No apparent complications. Electronically Signed: By: Margarette Canada M.D. On: 10/02/2019 10:02   [place] Lt Breast Bx W Loc Dev Ea Add Lesion Img Bx Spec US Guide  Addendum Date: 10/03/2019   ADDENDUM REPORT: 10/03/2019 12:30 ADDENDUM: PATHOLOGY revealed: A. BREAST, LEFT AT 330 O'CLOCK, 3 CM FROM THE NIPPLE (ANTERIOR); - INVASIVE MAMMARY CARCINOMA,  WITH LOBULAR FEATURES. 5 mm in this sample. Grade 2. Ductal carcinoma in situ: Not identified. Lymphovascular invasion: Not identified. B. BREAST, LEFT AT 330 O'CLOCK, 3 CM FROM THE NIPPLE (POSTERIOR/LATERAL); ULTRASOUND-GUIDED CORE BIOPSY: - INVASIVE MAMMARY CARCINOMA, WITH LOBULAR FEATURES. 14 mm in this sample. Grade 2. Ductal carcinoma in situ: Not identified. Lymphovascular invasion: Not identified. C. AXILLA, LEFT; ULTRASOUND-GUIDED CORE BIOPSY: - MACROMETASTATIC MAMMARY CARCINOMA WITHIN LYMPH NODE, MEASURING AT LEAST 7 MM IN GREATEST EXTENT, INVOLVING 3 OF 3 TISSUE CORES. Pathology results are CONCORDANT with imaging findings, per Dr. Hassan Rowan. Pathology results were discussed with patient via  telephone. The patient reported doing well after the biopsy with no adverse symptoms, only tenderness at the site. Post biopsy care instructions were reviewed and questions were answered. The patient was encouraged to call [facility] for any additional questions or concerns. Recommendation: If breast conservation is desired, recommend bilateral breast MRI without and with contrast given lobular features. Surgical referral. Request for surgical referral was relayed to nurse navigators at [facility] by Electa Sniff RN on 10/14 2020. Addendum by Electa Sniff RN on 10/03/2019. Electronically Signed   By: Margarette Canada M.D.   On: 10/03/2019 12:30   Result Date: 10/03/2019 CLINICAL DATA:  63 year old female for tissue sampling of anterior and posterior aspect of LOWER OUTER LEFT breast mass/nodularity and enlarged LEFT axillary lymph node. EXAM: ULTRASOUND GUIDED LEFT BREAST CORE NEEDLE BIOPSY X 2 ULTRASOUND-GUIDED CORE BIOPSY OF A LEFT AXILLARY LYMPH NODE COMPARISON:  Previous exam(s). FINDINGS: I met with the patient and we discussed the procedure of ultrasound-guided biopsies, including benefits and alternatives. We discussed the high likelihood of successful procedures. We discussed the risks  of the procedures, including infection, bleeding, tissue injury, clip migration, and inadequate sampling. Informed written consent was given. The usual time-out protocol was performed immediately prior to the procedures. ULTRASOUND-GUIDED LEFT BREAST BIOPSY #1 (anterior aspect of LOWER OUTER LEFT breast mass/nodularity-COIL clip): Lesion quadrant: LOWER OUTER Using sterile technique and 1% Lidocaine as local anesthetic, under direct ultrasound visualization, a 14 gauge spring-loaded device was used to perform biopsy of the anterior aspect of the irregular mass/nodularity at the 3:30 position of the LEFT breast using a SUPERIOR approach. At the conclusion of the procedure COIL tissue marker clip was deployed into the biopsy cavity. Follow up 2 view mammogram was performed and dictated separately. ULTRASOUND-GUIDED LEFT BREAST BIOPSY #2 (posterolateral aspect of LOWER OUTER LEFT breast mass-RIBBON clip): Lesion quadrant: LOWER OUTER Using sterile technique and 1% Lidocaine as local anesthetic, under direct ultrasound visualization, a 14 gauge spring-loaded device was used to perform biopsy of the posterolateral aspect of the sonographic nodularity/small masses at the 3:30 position of the LEFT breast using a MEDIAL approach. At the conclusion of the procedure RIBBON tissue marker clip was deployed into the biopsy cavity. Follow up 2 view mammogram was performed and dictated separately. ULTRASOUND-GUIDED CORE BIOPSY OF A LEFT AXILLARY LYMPH NODE: Using sterile technique and 1% Lidocaine as local anesthetic, under direct ultrasound visualization, a 14 gauge spring-loaded device was used to perform biopsy of the LEFT axillary lymph node with cortical thickening using a MEDIAL approach. At the conclusion of the procedure [facility] tissue marker clip was deployed into the biopsy cavity. Follow up 2 view mammogram was performed and dictated separately. IMPRESSION: Ultrasound guided biopsy of anterior and posterolateral  aspects of irregular LOWER OUTER LEFT breast mass/nodularity and an abnormal LEFT axillary lymph node. No apparent complications. Electronically Signed: By: Margarette Canada M.D. On: 10/02/2019 10:02   [place] Lt Breast Bx W Loc Dev Ea Add Lesion Img Bx Spec US Guide  Addendum Date: 10/03/2019   ADDENDUM REPORT: 10/03/2019 12:30 ADDENDUM: PATHOLOGY revealed: A. BREAST, LEFT AT 330 O'CLOCK, 3 CM FROM THE NIPPLE (ANTERIOR); - INVASIVE MAMMARY CARCINOMA, WITH LOBULAR FEATURES. 5 mm in this sample. Grade 2. Ductal carcinoma in situ: Not identified. Lymphovascular invasion: Not identified. B. BREAST, LEFT AT 330 O'CLOCK, 3 CM FROM THE NIPPLE (POSTERIOR/LATERAL); ULTRASOUND-GUIDED CORE BIOPSY: - INVASIVE MAMMARY CARCINOMA, WITH LOBULAR FEATURES. 14 mm in this sample. Grade 2. Ductal carcinoma in situ: Not  identified. Lymphovascular invasion: Not identified. C. AXILLA, LEFT; ULTRASOUND-GUIDED CORE BIOPSY: - MACROMETASTATIC MAMMARY CARCINOMA WITHIN LYMPH NODE, MEASURING AT LEAST 7 MM IN GREATEST EXTENT, INVOLVING 3 OF 3 TISSUE CORES. Pathology results are CONCORDANT with imaging findings, per Dr. Hassan Rowan. Pathology results were discussed with patient via telephone. The patient reported doing well after the biopsy with no adverse symptoms, only tenderness at the site. Post biopsy care instructions were reviewed and questions were answered. The patient was encouraged to call [facility] for any additional questions or concerns. Recommendation: If breast conservation is desired, recommend bilateral breast MRI without and with contrast given lobular features. Surgical referral. Request for surgical referral was relayed to nurse navigators at [facility] by Electa Sniff RN on 10/14 2020. Addendum by Electa Sniff RN on 10/03/2019. Electronically Signed   By: Margarette Canada M.D.   On: 10/03/2019 12:30   Result Date: 10/03/2019 CLINICAL DATA:  63 year old female for tissue sampling of anterior and posterior  aspect of LOWER OUTER LEFT breast mass/nodularity and enlarged LEFT axillary lymph node. EXAM: ULTRASOUND GUIDED LEFT BREAST CORE NEEDLE BIOPSY X 2 ULTRASOUND-GUIDED CORE BIOPSY OF A LEFT AXILLARY LYMPH NODE COMPARISON:  Previous exam(s). FINDINGS: I met with the patient and we discussed the procedure of ultrasound-guided biopsies, including benefits and alternatives. We discussed the high likelihood of successful procedures. We discussed the risks of the procedures, including infection, bleeding, tissue injury, clip migration, and inadequate sampling. Informed written consent was given. The usual time-out protocol was performed immediately prior to the procedures. ULTRASOUND-GUIDED LEFT BREAST BIOPSY #1 (anterior aspect of LOWER OUTER LEFT breast mass/nodularity-COIL clip): Lesion quadrant: LOWER OUTER Using sterile technique and 1% Lidocaine as local anesthetic, under direct ultrasound visualization, a 14 gauge spring-loaded device was used to perform biopsy of the anterior aspect of the irregular mass/nodularity at the 3:30 position of the LEFT breast using a SUPERIOR approach. At the conclusion of the procedure COIL tissue marker clip was deployed into the biopsy cavity. Follow up 2 view mammogram was performed and dictated separately. ULTRASOUND-GUIDED LEFT BREAST BIOPSY #2 (posterolateral aspect of LOWER OUTER LEFT breast mass-RIBBON clip): Lesion quadrant: LOWER OUTER Using sterile technique and 1% Lidocaine as local anesthetic, under direct ultrasound visualization, a 14 gauge spring-loaded device was used to perform biopsy of the posterolateral aspect of the sonographic nodularity/small masses at the 3:30 position of the LEFT breast using a MEDIAL approach. At the conclusion of the procedure RIBBON tissue marker clip was deployed into the biopsy cavity. Follow up 2 view mammogram was performed and dictated separately. ULTRASOUND-GUIDED CORE BIOPSY OF A LEFT AXILLARY LYMPH NODE: Using sterile technique and  1% Lidocaine as local anesthetic, under direct ultrasound visualization, a 14 gauge spring-loaded device was used to perform biopsy of the LEFT axillary lymph node with cortical thickening using a MEDIAL approach. At the conclusion of the procedure [facility] tissue marker clip was deployed into the biopsy cavity. Follow up 2 view mammogram was performed and dictated separately. IMPRESSION: Ultrasound guided biopsy of anterior and posterolateral aspects of irregular LOWER OUTER LEFT breast mass/nodularity and an abnormal LEFT axillary lymph node. No apparent complications. Electronically Signed: By: Margarette Canada M.D. On: 10/02/2019 10:02    Assessment and plan- Patient is a 63 y.o. 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  The mass along with the nodularity which extends to the nipple measures close to 5.4 cm.  She therefore at least has T3 disease.  She also had 1 solitary abnormal lymph node in the left axilla which was biopsied and was consistent with invasive mammary carcinoma with lobular features.  Her anatomic stage is stage IIIa and her clinical prognostic stage is IIA.  I would recommend getting a PET CT scan for complete staging work-up and a PET CT scan was not approved by her insurance she would need a CT chest abdomen pelvis with contrast as well as a bone scan for complete staging.  Given that there is lobular histology noted on core biopsy I would like to get MRI of her bilateral breasts to see if there is more or bilateral disease present.  Lobular breast cancers are typically not chemosensitive.  Given the fact that her breast mass extended into her skin I anticipate she will need mastectomy regardless of neoadjuvant chemotherapy.  Neoadjuvant chemotherapy may allow for downstaging of her  axilla and potentially avoid an axillary lymph node dissection.  At this time I will await PET/CT and MRI results and discuss her case with Dr.Toth as well.  Patient does have comorbidities and it may be challenging to get her through neoadjuvant chemotherapy without significant side effects.  There will be role for radiation therapy upon completion of surgery and/or chemotherapy.  Given the patient has ER positive disease I would also be role for hormone therapy for 5 to 10 years upon completion of surgery radiation plus minus chemotherapy which I will discuss with her in greater detail on the line.  Treatment will be given with curative intent if CT scan show no evidence of metastatic disease.   Total face to face encounter time for this patient visit was 60 min. >50% of the time was  spent in counseling and coordination of care.     Thank you for this kind referral and the opportunity to participate in the care of this  Patient   Visit Diagnosis 1. Malignant neoplasm of overlapping sites of left breast in female, estrogen receptor positive ([place])   2. Goals of care, counseling/discussion     Dr. Randa Evens, MD, MPH [facility] [phone] 10/14/2019 11:35 AM

## 2019-10-15 ENCOUNTER — Other Ambulatory Visit: Payer: Self-pay

## 2019-10-15 ENCOUNTER — Ambulatory Visit
Admission: RE | Admit: 2019-10-15 | Discharge: 2019-10-15 | Disposition: A | Discharge status: Home / Self Care | Payer: PPO | Source: Ambulatory Visit | Attending: Oncology | Admitting: Oncology

## 2019-10-15 ENCOUNTER — Telehealth: Payer: Self-pay | Admitting: *Deleted

## 2019-10-15 DIAGNOSIS — K429 Umbilical hernia without obstruction or gangrene: Secondary | ICD-10-CM | POA: Diagnosis not present

## 2019-10-15 DIAGNOSIS — Z170 Estrogen receptor positive status [ER+]: Secondary | ICD-10-CM | POA: Diagnosis not present

## 2019-10-15 DIAGNOSIS — I2510 Atherosclerotic heart disease of native coronary artery without angina pectoris: Secondary | ICD-10-CM | POA: Diagnosis not present

## 2019-10-15 DIAGNOSIS — C50812 Malignant neoplasm of overlapping sites of left female breast: Secondary | ICD-10-CM

## 2019-10-15 DIAGNOSIS — N6320 Unspecified lump in the left breast, unspecified quadrant: Secondary | ICD-10-CM | POA: Insufficient documentation

## 2019-10-15 DIAGNOSIS — R160 Hepatomegaly, not elsewhere classified: Secondary | ICD-10-CM | POA: Insufficient documentation

## 2019-10-15 DIAGNOSIS — R590 Localized enlarged lymph nodes: Secondary | ICD-10-CM | POA: Insufficient documentation

## 2019-10-15 DIAGNOSIS — C50912 Malignant neoplasm of unspecified site of left female breast: Secondary | ICD-10-CM | POA: Diagnosis not present

## 2019-10-15 LAB — GLUCOSE, CAPILLARY: Glucose-Capillary: 151 mg/dL — ABNORMAL HIGH (ref 70–99)

## 2019-10-15 MED ORDER — FLUDEOXYGLUCOSE F - 18 (FDG) INJECTION
10.6300 | Freq: Once | INTRAVENOUS | Status: AC | PRN
  Administered 2019-10-15: 10.63 via INTRAVENOUS

## 2019-10-15 NOTE — Telephone Encounter (Signed)
I called the patient and let her know that the patient has been scheduled for 11/6 for MRI  Of breast. She will need to be a [facility] at 8:45 . They prefer that people do not eat but with her being diabetic she needs to eat. Last time when she had u/s gided bx she had cereal and coffee and did well. I told her to do it this time also. She is agreeable to plan

## 2019-10-15 NOTE — Telephone Encounter (Signed)
Called pt and spoke to her about her MRI of breast-I spoke to [place] today and her first available is 11/6 arrival at 8:45 for 9 am MRI. I told pt that it is preferred to having nothing to eat or drink. The pt. Is diabetic and she said last time the hs u/s guided bx she had bowl of cereal and a coffee. I told her that she could do that. It is something light and they said it was preferred but not mandatory. She will be there at [facility]

## 2019-10-19 ENCOUNTER — Other Ambulatory Visit: Payer: Self-pay

## 2019-10-19 ENCOUNTER — Encounter: Payer: Self-pay | Admitting: Oncology

## 2019-10-19 ENCOUNTER — Inpatient Hospital Stay (HOSPITAL_BASED_OUTPATIENT_CLINIC_OR_DEPARTMENT_OTHER): Payer: PPO | Admitting: Oncology

## 2019-10-19 VITALS — BP 100/64 | HR 75 | Temp 97.0°F | Ht 61.0 in | Wt 192.0 lb

## 2019-10-19 DIAGNOSIS — Z7189 Other specified counseling: Secondary | ICD-10-CM

## 2019-10-19 DIAGNOSIS — C50812 Malignant neoplasm of overlapping sites of left female breast: Secondary | ICD-10-CM

## 2019-10-19 DIAGNOSIS — Z170 Estrogen receptor positive status [ER+]: Secondary | ICD-10-CM

## 2019-10-19 NOTE — Progress Notes (Signed)
Patient stated that she had been doing well. Patient's mammogram is scheduled to be on 10/26/2019 at [facility].

## 2019-10-22 ENCOUNTER — Ambulatory Visit: Payer: PPO | Admitting: Oncology

## 2019-10-22 NOTE — Progress Notes (Signed)
Hematology/Oncology Consult note [facility]  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  She has not undergone a mammogram for several years.  Mammogram on October 2020 was done after she had a palpable area of concern in the left breast which showed an irregular mass with increased vascularity measuring 3.1 x 2.4 x 4.5 cm.  The nodularity extended towards the nipple measuring at least 5.4 cm.  Single abnormal lymph node in the left axilla with cortical thickness of 0.7 cm.  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    PET scan showed hypermetabolic left axillary lymph nodes which was reviewed at tumor board and they were at least found to be 3-4.  Retroareolar left breast mass 2.5 x 3.2 cm in size.  No evidence of distant metastatic disease.  There were small to borderline enlarged retroperitoneal lymph nodes 10 mm in size with an SUV of 4.3.  Retroperitoneal lymph nodes will require follow-up in the future  MRI bilateral breasts is currently pending  Interval history-patient has ongoing fatigue as well as joint  pain but reports no new complaints.  ECOG PS- 1 Pain scale-4 Opioid associated constipation- no  Review of systems- Review of Systems  Constitutional: Positive for malaise/fatigue. Negative for chills, fever and weight loss.  HENT: Negative for congestion, ear discharge and nosebleeds.   Eyes: Negative for blurred vision.  Respiratory: Negative for cough, hemoptysis, sputum production, shortness of breath and wheezing.   Cardiovascular: Negative for chest pain, palpitations, orthopnea and claudication.  Gastrointestinal: Negative for abdominal pain, blood in stool, constipation, diarrhea, heartburn, melena, nausea and vomiting.  Genitourinary: Negative for dysuria, flank pain, frequency, hematuria and urgency.  Musculoskeletal: Positive for joint pain. Negative for back pain and myalgias.  Skin: Negative for rash.  Neurological: Negative for dizziness, tingling, focal weakness, seizures, weakness and headaches.  Endo/Heme/Allergies: Does not bruise/bleed easily.  Psychiatric/Behavioral: Negative for depression and suicidal ideas. The patient does not have insomnia.       Allergies  Allergen Reactions   Hydralazine Swelling    Other reaction(s): Eye Swelling   Other Other (See Comments)    Unknown blood pressure medication caused face swelling     Past Medical History:  Diagnosis Date   Arthritis of both feet    Chronic combined systolic and diastolic CHF (congestive heart failure) ([place])    a. Echo (06/2014): Severe LVH, EF 20-25%, no RWMA, Gr 3 DD, trivial MR, mild LAE, mild RVE, PASP 38 mmHg .>> b. Echo 2/16 EF 50-55% //  // c. Echo 02/2019: EF 50-55, mod LVH, Gr 1 DD, mild LAE    CKD (chronic kidney disease), stage III    Diabetes mellitus type 2 in obese ([place])  Diverticular disease    Family history of breast cancer    Family history of leukemia    Family history of lung cancer    Family history of testicular cancer    Family history of throat cancer     Fatty liver    History of GI diverticular bleed    Hyperlipidemia    Hyperphosphatemia    Hypertension    Hypertensive heart disease    a. Renal Artery Duplex (8/15):  no RAS   Iron deficiency anemia    NICM (nonischemic cardiomyopathy) ([place])    a. Nuclear (06/2014): EF 32%, apical thinning, no definite scar or ischemia;  b. Echo (2/16):  Mild LVH, EF 50-55%, Gr 2 DD, no RWMA   Obesity    Proteinuria    PUD (peptic ulcer disease)    Secondary hyperparathyroidism, renal (HCC)    Tobacco abuse    UTI (lower urinary tract infection)    Vaginal dryness, menopausal    Vaginal yeast infection      Past Surgical History:  Procedure Laterality Date   DILATION AND CURETTAGE OF UTERUS     None      Social History   Socioeconomic History   Marital status: Married    Spouse name: Not on file   Number of children: 2   Years of education: 7   Highest education level: Not on file  Occupational History   Not on file  Social Needs   Financial resource strain: Not on file   Food insecurity    Worry: Not on file    Inability: Not on file   Transportation needs    Medical: Not on file    Non-medical: Not on file  Tobacco Use   Smoking status: Former Smoker    Quit date: 07/05/2014    Years since quitting: 5.3   Smokeless tobacco: Never Used  Substance and Sexual Activity   Alcohol use: No   Drug use: No   Sexual activity: Not on file  Lifestyle   Physical activity    Days per week: Not on file    Minutes per session: Not on file   Stress: Not on file  Relationships   Social connections    Talks on phone: Not on file    Gets together: Not on file    Attends religious service: Not on file    Active member of club or organization: Not on file    Attends meetings of clubs or organizations: Not on file    Relationship status: Not on file   Intimate partner violence    Fear of current or ex partner: Not on file    Emotionally abused: Not on  file    Physically abused: Not on file    Forced sexual activity: Not on file  Other Topics Concern   Not on file  Social History Narrative   Currently lives in a house with her husband.    Fun: Watch TV.   Denies religious beliefs effecting health care.     Family History  Problem Relation Age of Onset   Heart attack Paternal Grandfather    Stroke Paternal Grandmother    Hypertension Paternal Grandmother    Kidney disease Mother    Diabetes Mother    Testicular cancer Father        diagnosed older than 55   Coronary artery disease Other    Hypertension Other    Breast cancer Sister  diagnosed late 35s, negative genetics   Leukemia Brother        chronic   Throat cancer Brother    Pneumonia Sister 2   Lung cancer Paternal Uncle        diagnosed over 41     Current Outpatient Medications:    acetaminophen (TYLENOL) 500 MG tablet, Take 1,000 mg by mouth every 6 (six) hours as needed for mild pain, moderate pain or headache. For pain, Disp: , Rfl:    allopurinol (ZYLOPRIM) 100 MG tablet, Take 200 mg by mouth daily., Disp: , Rfl:    amLODipine (NORVASC) 5 MG tablet, Take 1 tablet (5 mg total) by mouth daily., Disp: 90 tablet, Rfl: 3   aspirin EC 81 MG EC tablet, Take 1 tablet (81 mg total) by mouth daily., Disp: , Rfl:    atorvastatin (LIPITOR) 40 MG tablet, Take 1 tablet (40 mg total) by mouth daily at 6 PM., Disp: 90 tablet, Rfl: 3   B-D ULTRAFINE III SHORT PEN 31G X 8 MM MISC, USE AS DIRECTED *EMERGENCY FILL*, Disp: , Rfl: 0   blood glucose meter kit and supplies KIT, Dispense based on patient and insurance preference. Use up to four times daily as directed. (FOR ICD-9 250.00, 250.01)., Disp: 1 each, Rfl: 0   calcitRIOL (ROCALTROL) 0.25 MCG capsule, Take 0.25 mcg by mouth daily., Disp: , Rfl:    cloNIDine (CATAPRES) 0.1 MG tablet, Take 1 tablet (0.1 mg total) by mouth 2 (two) times daily., Disp: 180 tablet, Rfl: 3   furosemide (LASIX) 40 MG  tablet, Take 40 mg by mouth 2 (two) times daily., Disp: , Rfl:    gabapentin (NEURONTIN) 100 MG capsule, Take 100 mg by mouth 3 (three) times daily., Disp: , Rfl:    insulin aspart (NOVOLOG FLEXPEN) 100 UNIT/ML FlexPen, Inject 5 Units into the skin 3 (three) times daily with meals. Additional insulin (5 units PLUS) CBG 70 - 120: 0 units CBG 121 - 150: 1 unit CBG 151 - 200: 2 units CBG 201 - 250: 3 units CBG 251 - 350: 4 units CBG 351 - 400: 6 units, Disp: 15 mL, Rfl: 11   insulin glargine (LANTUS) 100 UNIT/ML injection, Inject into the skin daily. Inject 20 units into skin at 10pm, Disp: , Rfl:    isosorbide mononitrate (IMDUR) 120 MG 24 hr tablet, TAKE 1/2 TABLET DAILY = 60 MG DAILY, Disp: 45 tablet, Rfl: 3   metoprolol tartrate (LOPRESSOR) 25 MG tablet, Take 1 tablet (25 mg total) by mouth 2 (two) times daily., Disp: 180 tablet, Rfl: 3   ONE TOUCH ULTRA TEST test strip, 1 each by Other route 4 (four) times daily. , Disp: , Rfl: 1   ONETOUCH DELICA LANCETS 24M MISC, Inject 33 g into the skin 4 (four) times daily. , Disp: , Rfl: 1   Polyethylene Glycol 3350 (MIRALAX PO), Take by mouth daily as needed. , Disp: , Rfl:    telmisartan (MICARDIS) 20 MG tablet, Take 1 tablet (20 mg total) by mouth daily., Disp: 90 tablet, Rfl: 3   Colchicine 0.6 MG CAPS, Take by mouth every other day. , Disp: , Rfl:    iron polysaccharides (NU-IRON) 150 MG capsule, Take 150 mg by mouth 2 (two) times daily. , Disp: , Rfl:   Physical exam:  Vitals:   10/19/19 1109  BP: 100/64  Pulse: 75  Temp: (!) 97 F (36.1 C)  TempSrc: Tympanic  Weight: 192 lb (87.1 kg)  Height: 5' 1" (1.549 m)  Physical Exam Constitutional:      General: She is not in acute distress.    Appearance: She is obese.  HENT:     Head: Normocephalic and atraumatic.  Eyes:     Pupils: Pupils are equal, round, and reactive to light.  Neck:     Musculoskeletal: Normal range of motion.  Cardiovascular:     Rate and Rhythm: Normal rate  and regular rhythm.     Heart sounds: Normal heart sounds.  Pulmonary:     Effort: Pulmonary effort is normal.     Breath sounds: Normal breath sounds.  Abdominal:     General: Bowel sounds are normal.     Palpations: Abdomen is soft.  Skin:    General: Skin is warm and dry.  Neurological:     Mental Status: She is alert and oriented to person, place, and time.      CMP Latest Ref Rng & Units 10/09/2019  Glucose 70 - 99 mg/dL 118(H)  BUN 8 - 23 mg/dL 45(H)  Creatinine 0.44 - 1.00 mg/dL 1.75(H)  Sodium 135 - 145 mmol/L 141  Potassium 3.5 - 5.1 mmol/L 4.2  Chloride 98 - 111 mmol/L 101  CO2 22 - 32 mmol/L 29  Calcium 8.9 - 10.3 mg/dL 9.5  Total Protein 6.5 - 8.1 g/dL 8.2(H)  Total Bilirubin 0.3 - 1.2 mg/dL 0.7  Alkaline Phos 38 - 126 U/L 114  AST 15 - 41 U/L 18  ALT 0 - 44 U/L 18   CBC Latest Ref Rng & Units 10/09/2019  WBC 4.0 - 10.5 K/uL 5.9  Hemoglobin 12.0 - 15.0 g/dL 13.2  Hematocrit 36.0 - 46.0 % 42.6  Platelets 150 - 400 K/uL 250    No images are attached to the encounter.  Nm Pet Image Initial (pi) Skull Base To Thigh  Result Date: 10/15/2019 CLINICAL DATA:  Initial treatment strategy for breast cancer. EXAM: NUCLEAR MEDICINE PET SKULL BASE TO THIGH TECHNIQUE: 10.6 mCi F-18 FDG was injected intravenously. Full-ring PET imaging was performed from the skull base to thigh after the radiotracer. CT data was obtained and used for attenuation correction and anatomic localization. Fasting blood glucose: 151 mg/dl COMPARISON:  CT abdomen pelvis 12/05/2015. FINDINGS: Mediastinal blood pool activity: SUV max 3.6 Liver activity: SUV max NA NECK: No abnormal hypermetabolism. Incidental CT findings: Incidental note is made of right lateral ventricular dilatation. Otherwise negative. CHEST: Hypermetabolic left axillary lymph nodes measure up to 10 mm (3/74) with an SUV max 4.7. Mild hypermetabolism is seen in the upper outer left breast tissue (3/86) with an SUV max 4.8. A mass in  the retroareolar left breast measures 2.5 x 3.2 cm with an SUV max 9.5. No hypermetabolic mediastinal, hilar, right axillary or internal mammary lymph nodes. No hypermetabolic pulmonary nodules. Incidental CT findings: Atherosclerotic calcification of the aorta and coronary arteries. Heart is enlarged. No pericardial or pleural effusion. ABDOMEN/PELVIS: No abnormal hypermetabolism in the liver, adrenal glands, spleen or pancreas. There are scattered small to borderline enlarged pelvic retroperitoneal lymph nodes which are mildly hypermetabolic. Index right external iliac lymph node measures 10 mm (3/198) with an SUV max of 4.3. Index left common iliac lymph node measures 10 mm (3/170) an SUV max of 5.1. Incidental CT findings: Liver is enlarged. Liver, gallbladder, adrenal glands, kidneys, spleen, pancreas, stomach and bowel are otherwise unremarkable. Atherosclerotic calcification of the aorta. Midline periumbilical ventral hernia contains omental fat. SKELETON: No abnormal osseous hypermetabolism. Incidental CT findings: Degenerative changes in the spine. IMPRESSION: 1.  Hypermetabolic left breast mass with hypermetabolic left axillary lymph nodes. No evidence of distant metastatic disease. 2. Second area of mild hypermetabolism in the upper outer left breast tissue without a definite CT correlate. 3. Mildly hypermetabolic small to borderline enlarged pelvic retroperitoneal lymph nodes are nonspecific. Continued attention on follow-up exams is warranted. 4. Hepatomegaly. 5. Midline periumbilical hernia contains omental fat. 6. Aortic atherosclerosis (ICD10-170.0). Coronary artery calcification. Electronically Signed   By: Lorin Picket M.D.   On: 10/15/2019 11:51   Mm Clip Placement Left  Result Date: 10/02/2019 CLINICAL DATA:  Evaluate placement of biopsy clips following ultrasound-guided LEFT breast and axillary biopsies. EXAM: DIAGNOSTIC LEFT MAMMOGRAM POST ULTRASOUND BIOPSY COMPARISON:  Previous exam(s).  FINDINGS: Mammographic images were obtained following ultrasound-guided biopsies of anterior and posterior aspects of OUTER LEFT breast mass(es) and abnormal LEFT axillary lymph node. The COIL clip is in satisfactory position at the biopsy site of anterior aspect of nodularity/mass at the 3:30 position of the LEFT breast. The RIBBON clip is in satisfactory position at the biopsy site of the posterolateral aspect of the OUTER LEFT breast mass/nodularity. Please note that the RIBBON clip is located 2 cm LATERAL/SUPERIOR to the dominant irregular mass. The RIBBON clip and the COIL clip are separated by a distance of 7.5 cm. The [facility] clip is in satisfactory position at the site of LEFT axillary lymph node biopsy. IMPRESSION: Satisfactory placement of COIL clip and RIBBON clip at the biopsy sites of the anterior and posterior aspects of LOWER OUTER LEFT breast mass/nodularity. The 2 clips are separated by a distance of 7.5 cm. Please note that the RIBBON clip is located 2 cm LATERAL/SUPERIOR to the dominant irregular mass identified mammographically, but is in satisfactory position at the biopsy site of small hypoechoic mass/nodularity. HydroMARK clip in satisfactory position at the site of LEFT axillary lymph node biopsy. Final Assessment: Post Procedure Mammograms for Marker Placement Electronically Signed   By: Margarette Canada M.D.   On: 10/02/2019 10:01   [place] Lt Breast Bx W Loc Dev 1st Lesion Img Bx Spec US Guide  Addendum Date: 10/03/2019   ADDENDUM REPORT: 10/03/2019 12:30 ADDENDUM: PATHOLOGY revealed: A. BREAST, LEFT AT 330 O'CLOCK, 3 CM FROM THE NIPPLE (ANTERIOR); - INVASIVE MAMMARY CARCINOMA, WITH LOBULAR FEATURES. 5 mm in this sample. Grade 2. Ductal carcinoma in situ: Not identified. Lymphovascular invasion: Not identified. B. BREAST, LEFT AT 330 O'CLOCK, 3 CM FROM THE NIPPLE (POSTERIOR/LATERAL); ULTRASOUND-GUIDED CORE BIOPSY: - INVASIVE MAMMARY CARCINOMA, WITH LOBULAR FEATURES. 14 mm in this sample.  Grade 2. Ductal carcinoma in situ: Not identified. Lymphovascular invasion: Not identified. C. AXILLA, LEFT; ULTRASOUND-GUIDED CORE BIOPSY: - MACROMETASTATIC MAMMARY CARCINOMA WITHIN LYMPH NODE, MEASURING AT LEAST 7 MM IN GREATEST EXTENT, INVOLVING 3 OF 3 TISSUE CORES. Pathology results are CONCORDANT with imaging findings, per Dr. Hassan Rowan. Pathology results were discussed with patient via telephone. The patient reported doing well after the biopsy with no adverse symptoms, only tenderness at the site. Post biopsy care instructions were reviewed and questions were answered. The patient was encouraged to call [facility] for any additional questions or concerns. Recommendation: If breast conservation is desired, recommend bilateral breast MRI without and with contrast given lobular features. Surgical referral. Request for surgical referral was relayed to nurse navigators at [facility] by Electa Sniff RN on 10/14 2020. Addendum by Electa Sniff RN on 10/03/2019. Electronically Signed   By: Margarette Canada M.D.   On: 10/03/2019 12:30   Result Date: 10/03/2019 CLINICAL  DATA:  63 year old female for tissue sampling of anterior and posterior aspect of LOWER OUTER LEFT breast mass/nodularity and enlarged LEFT axillary lymph node. EXAM: ULTRASOUND GUIDED LEFT BREAST CORE NEEDLE BIOPSY X 2 ULTRASOUND-GUIDED CORE BIOPSY OF A LEFT AXILLARY LYMPH NODE COMPARISON:  Previous exam(s). FINDINGS: I met with the patient and we discussed the procedure of ultrasound-guided biopsies, including benefits and alternatives. We discussed the high likelihood of successful procedures. We discussed the risks of the procedures, including infection, bleeding, tissue injury, clip migration, and inadequate sampling. Informed written consent was given. The usual time-out protocol was performed immediately prior to the procedures. ULTRASOUND-GUIDED LEFT BREAST BIOPSY #1 (anterior aspect of LOWER OUTER LEFT breast  mass/nodularity-COIL clip): Lesion quadrant: LOWER OUTER Using sterile technique and 1% Lidocaine as local anesthetic, under direct ultrasound visualization, a 14 gauge spring-loaded device was used to perform biopsy of the anterior aspect of the irregular mass/nodularity at the 3:30 position of the LEFT breast using a SUPERIOR approach. At the conclusion of the procedure COIL tissue marker clip was deployed into the biopsy cavity. Follow up 2 view mammogram was performed and dictated separately. ULTRASOUND-GUIDED LEFT BREAST BIOPSY #2 (posterolateral aspect of LOWER OUTER LEFT breast mass-RIBBON clip): Lesion quadrant: LOWER OUTER Using sterile technique and 1% Lidocaine as local anesthetic, under direct ultrasound visualization, a 14 gauge spring-loaded device was used to perform biopsy of the posterolateral aspect of the sonographic nodularity/small masses at the 3:30 position of the LEFT breast using a MEDIAL approach. At the conclusion of the procedure RIBBON tissue marker clip was deployed into the biopsy cavity. Follow up 2 view mammogram was performed and dictated separately. ULTRASOUND-GUIDED CORE BIOPSY OF A LEFT AXILLARY LYMPH NODE: Using sterile technique and 1% Lidocaine as local anesthetic, under direct ultrasound visualization, a 14 gauge spring-loaded device was used to perform biopsy of the LEFT axillary lymph node with cortical thickening using a MEDIAL approach. At the conclusion of the procedure [facility] tissue marker clip was deployed into the biopsy cavity. Follow up 2 view mammogram was performed and dictated separately. IMPRESSION: Ultrasound guided biopsy of anterior and posterolateral aspects of irregular LOWER OUTER LEFT breast mass/nodularity and an abnormal LEFT axillary lymph node. No apparent complications. Electronically Signed: By: Margarette Canada M.D. On: 10/02/2019 10:02   [place] Lt Breast Bx W Loc Dev Ea Add Lesion Img Bx Spec US Guide  Addendum Date: 10/03/2019   ADDENDUM REPORT:  10/03/2019 12:30 ADDENDUM: PATHOLOGY revealed: A. BREAST, LEFT AT 330 O'CLOCK, 3 CM FROM THE NIPPLE (ANTERIOR); - INVASIVE MAMMARY CARCINOMA, WITH LOBULAR FEATURES. 5 mm in this sample. Grade 2. Ductal carcinoma in situ: Not identified. Lymphovascular invasion: Not identified. B. BREAST, LEFT AT 330 O'CLOCK, 3 CM FROM THE NIPPLE (POSTERIOR/LATERAL); ULTRASOUND-GUIDED CORE BIOPSY: - INVASIVE MAMMARY CARCINOMA, WITH LOBULAR FEATURES. 14 mm in this sample. Grade 2. Ductal carcinoma in situ: Not identified. Lymphovascular invasion: Not identified. C. AXILLA, LEFT; ULTRASOUND-GUIDED CORE BIOPSY: - MACROMETASTATIC MAMMARY CARCINOMA WITHIN LYMPH NODE, MEASURING AT LEAST 7 MM IN GREATEST EXTENT, INVOLVING 3 OF 3 TISSUE CORES. Pathology results are CONCORDANT with imaging findings, per Dr. Hassan Rowan. Pathology results were discussed with patient via telephone. The patient reported doing well after the biopsy with no adverse symptoms, only tenderness at the site. Post biopsy care instructions were reviewed and questions were answered. The patient was encouraged to call [facility] for any additional questions or concerns. Recommendation: If breast conservation is desired, recommend bilateral breast MRI without and with contrast given lobular features.  Surgical referral. Request for surgical referral was relayed to nurse navigators at [facility] by Electa Sniff RN on 10/14 2020. Addendum by Electa Sniff RN on 10/03/2019. Electronically Signed   By: Margarette Canada M.D.   On: 10/03/2019 12:30   Result Date: 10/03/2019 CLINICAL DATA:  63 year old female for tissue sampling of anterior and posterior aspect of LOWER OUTER LEFT breast mass/nodularity and enlarged LEFT axillary lymph node. EXAM: ULTRASOUND GUIDED LEFT BREAST CORE NEEDLE BIOPSY X 2 ULTRASOUND-GUIDED CORE BIOPSY OF A LEFT AXILLARY LYMPH NODE COMPARISON:  Previous exam(s). FINDINGS: I met with the patient and we discussed the procedure of  ultrasound-guided biopsies, including benefits and alternatives. We discussed the high likelihood of successful procedures. We discussed the risks of the procedures, including infection, bleeding, tissue injury, clip migration, and inadequate sampling. Informed written consent was given. The usual time-out protocol was performed immediately prior to the procedures. ULTRASOUND-GUIDED LEFT BREAST BIOPSY #1 (anterior aspect of LOWER OUTER LEFT breast mass/nodularity-COIL clip): Lesion quadrant: LOWER OUTER Using sterile technique and 1% Lidocaine as local anesthetic, under direct ultrasound visualization, a 14 gauge spring-loaded device was used to perform biopsy of the anterior aspect of the irregular mass/nodularity at the 3:30 position of the LEFT breast using a SUPERIOR approach. At the conclusion of the procedure COIL tissue marker clip was deployed into the biopsy cavity. Follow up 2 view mammogram was performed and dictated separately. ULTRASOUND-GUIDED LEFT BREAST BIOPSY #2 (posterolateral aspect of LOWER OUTER LEFT breast mass-RIBBON clip): Lesion quadrant: LOWER OUTER Using sterile technique and 1% Lidocaine as local anesthetic, under direct ultrasound visualization, a 14 gauge spring-loaded device was used to perform biopsy of the posterolateral aspect of the sonographic nodularity/small masses at the 3:30 position of the LEFT breast using a MEDIAL approach. At the conclusion of the procedure RIBBON tissue marker clip was deployed into the biopsy cavity. Follow up 2 view mammogram was performed and dictated separately. ULTRASOUND-GUIDED CORE BIOPSY OF A LEFT AXILLARY LYMPH NODE: Using sterile technique and 1% Lidocaine as local anesthetic, under direct ultrasound visualization, a 14 gauge spring-loaded device was used to perform biopsy of the LEFT axillary lymph node with cortical thickening using a MEDIAL approach. At the conclusion of the procedure [facility] tissue marker clip was deployed into the biopsy  cavity. Follow up 2 view mammogram was performed and dictated separately. IMPRESSION: Ultrasound guided biopsy of anterior and posterolateral aspects of irregular LOWER OUTER LEFT breast mass/nodularity and an abnormal LEFT axillary lymph node. No apparent complications. Electronically Signed: By: Margarette Canada M.D. On: 10/02/2019 10:02   [place] Lt Breast Bx W Loc Dev Ea Add Lesion Img Bx Spec US Guide  Addendum Date: 10/03/2019   ADDENDUM REPORT: 10/03/2019 12:30 ADDENDUM: PATHOLOGY revealed: A. BREAST, LEFT AT 330 O'CLOCK, 3 CM FROM THE NIPPLE (ANTERIOR); - INVASIVE MAMMARY CARCINOMA, WITH LOBULAR FEATURES. 5 mm in this sample. Grade 2. Ductal carcinoma in situ: Not identified. Lymphovascular invasion: Not identified. B. BREAST, LEFT AT 330 O'CLOCK, 3 CM FROM THE NIPPLE (POSTERIOR/LATERAL); ULTRASOUND-GUIDED CORE BIOPSY: - INVASIVE MAMMARY CARCINOMA, WITH LOBULAR FEATURES. 14 mm in this sample. Grade 2. Ductal carcinoma in situ: Not identified. Lymphovascular invasion: Not identified. C. AXILLA, LEFT; ULTRASOUND-GUIDED CORE BIOPSY: - MACROMETASTATIC MAMMARY CARCINOMA WITHIN LYMPH NODE, MEASURING AT LEAST 7 MM IN GREATEST EXTENT, INVOLVING 3 OF 3 TISSUE CORES. Pathology results are CONCORDANT with imaging findings, per Dr. Hassan Rowan. Pathology results were discussed with patient via telephone. The patient reported doing well after the biopsy with  no adverse symptoms, only tenderness at the site. Post biopsy care instructions were reviewed and questions were answered. The patient was encouraged to call [facility] for any additional questions or concerns. Recommendation: If breast conservation is desired, recommend bilateral breast MRI without and with contrast given lobular features. Surgical referral. Request for surgical referral was relayed to nurse navigators at [facility] by Electa Sniff RN on 10/14 2020. Addendum by Electa Sniff RN on 10/03/2019. Electronically Signed   By: Margarette Canada M.D.   On: 10/03/2019 12:30   Result Date: 10/03/2019 CLINICAL DATA:  63 year old female for tissue sampling of anterior and posterior aspect of LOWER OUTER LEFT breast mass/nodularity and enlarged LEFT axillary lymph node. EXAM: ULTRASOUND GUIDED LEFT BREAST CORE NEEDLE BIOPSY X 2 ULTRASOUND-GUIDED CORE BIOPSY OF A LEFT AXILLARY LYMPH NODE COMPARISON:  Previous exam(s). FINDINGS: I met with the patient and we discussed the procedure of ultrasound-guided biopsies, including benefits and alternatives. We discussed the high likelihood of successful procedures. We discussed the risks of the procedures, including infection, bleeding, tissue injury, clip migration, and inadequate sampling. Informed written consent was given. The usual time-out protocol was performed immediately prior to the procedures. ULTRASOUND-GUIDED LEFT BREAST BIOPSY #1 (anterior aspect of LOWER OUTER LEFT breast mass/nodularity-COIL clip): Lesion quadrant: LOWER OUTER Using sterile technique and 1% Lidocaine as local anesthetic, under direct ultrasound visualization, a 14 gauge spring-loaded device was used to perform biopsy of the anterior aspect of the irregular mass/nodularity at the 3:30 position of the LEFT breast using a SUPERIOR approach. At the conclusion of the procedure COIL tissue marker clip was deployed into the biopsy cavity. Follow up 2 view mammogram was performed and dictated separately. ULTRASOUND-GUIDED LEFT BREAST BIOPSY #2 (posterolateral aspect of LOWER OUTER LEFT breast mass-RIBBON clip): Lesion quadrant: LOWER OUTER Using sterile technique and 1% Lidocaine as local anesthetic, under direct ultrasound visualization, a 14 gauge spring-loaded device was used to perform biopsy of the posterolateral aspect of the sonographic nodularity/small masses at the 3:30 position of the LEFT breast using a MEDIAL approach. At the conclusion of the procedure RIBBON tissue marker clip was deployed into the biopsy cavity. Follow up 2  view mammogram was performed and dictated separately. ULTRASOUND-GUIDED CORE BIOPSY OF A LEFT AXILLARY LYMPH NODE: Using sterile technique and 1% Lidocaine as local anesthetic, under direct ultrasound visualization, a 14 gauge spring-loaded device was used to perform biopsy of the LEFT axillary lymph node with cortical thickening using a MEDIAL approach. At the conclusion of the procedure [facility] tissue marker clip was deployed into the biopsy cavity. Follow up 2 view mammogram was performed and dictated separately. IMPRESSION: Ultrasound guided biopsy of anterior and posterolateral aspects of irregular LOWER OUTER LEFT breast mass/nodularity and an abnormal LEFT axillary lymph node. No apparent complications. Electronically Signed: By: Margarette Canada M.D. On: 10/02/2019 10:02     Assessment and plan- Patient is a 63 y.o. 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  She has borderline enlarged retroperitoneal lymph nodes which I will continue to follow in subsequent scans.  Patient does have a history of heart failure and will be meeting her cardiologist next week.  Her recent echocardiogram In March 2020 showed low normal systolic function with an EF of 50 to 55%  and moderately increased left ventricular wall thickness as well as impaired diastolic function.  I will await further cardiology recommendations but I doubt that patient would be a candidate for anthracycline treatment.  Patient has lobular histology on core biopsy which is not typically chemo responsive and if she gets a nonanthracycline-based regimen I doubt if patient will get any significant response in her axilla.  Her breast mass is tethered to her skin and she will need a mastectomy regardless.  The  only advantage of offering neoadjuvant chemotherapy would be to downstage her axilla.  Given her cardiac history and ongoing arthritis-I am concerned that patient may not be able to tolerate chemotherapy without any purulence or complications which will further delay her surgery.  Presently I am inclined to offer her chemotherapy adjuvantly and for her to proceed with mastectomy and axillary lymph node dissection upfront.  I will reach out to the patient after she sees cardiology next week and reach out to Dr. Marlou Starks as well.  Treatment will be given with a curative intent.  Follow-up to be decided after discussion with Dr. Hampton Abbot     Visit Diagnosis 1. Goals of care, counseling/discussion   2. Malignant neoplasm of overlapping sites of left breast in female, estrogen receptor positive ([place])      Dr. Randa Evens, MD, MPH [facility] at [facility] [phone] 10/22/2019 12:52 PM

## 2019-10-24 ENCOUNTER — Other Ambulatory Visit: Payer: Self-pay

## 2019-10-24 ENCOUNTER — Ambulatory Visit: Payer: PPO | Admitting: Cardiology

## 2019-10-24 ENCOUNTER — Encounter: Payer: Self-pay | Admitting: Cardiology

## 2019-10-24 VITALS — BP 100/66 | HR 70 | Ht 61.0 in | Wt 193.0 lb

## 2019-10-24 DIAGNOSIS — Z01810 Encounter for preprocedural cardiovascular examination: Secondary | ICD-10-CM | POA: Diagnosis not present

## 2019-10-24 DIAGNOSIS — I5032 Chronic diastolic (congestive) heart failure: Secondary | ICD-10-CM

## 2019-10-24 DIAGNOSIS — I10 Essential (primary) hypertension: Secondary | ICD-10-CM | POA: Diagnosis not present

## 2019-10-24 DIAGNOSIS — Z794 Long term (current) use of insulin: Secondary | ICD-10-CM | POA: Diagnosis not present

## 2019-10-24 DIAGNOSIS — N1832 Chronic kidney disease, stage 3b: Secondary | ICD-10-CM | POA: Diagnosis not present

## 2019-10-24 DIAGNOSIS — E1121 Type 2 diabetes mellitus with diabetic nephropathy: Secondary | ICD-10-CM | POA: Diagnosis not present

## 2019-10-24 DIAGNOSIS — E1122 Type 2 diabetes mellitus with diabetic chronic kidney disease: Secondary | ICD-10-CM

## 2019-10-24 NOTE — Patient Instructions (Addendum)
Medication Instructions:  Your physician recommends that you continue on your current medications as directed. Please refer to the Current Medication list given to you today.  *If you need a refill on your cardiac medications before your next appointment, please call your pharmacy*  Lab Work: None   If you have labs (blood work) drawn today and your tests are completely normal, you will receive your results only by: Marland Kitchen MyChart Message (if you have MyChart) OR . A paper copy in the mail If you have any lab test that is abnormal or we need to change your treatment, we will call you to review the results.  Testing/Procedures: None   Follow-Up: You are scheduled to see Richardson Dopp PA-C on 01/29/2020 @ 3:15 PM  Other Instructions

## 2019-10-24 NOTE — Progress Notes (Addendum)
 Addendum 10/25/2019 :  Note on chemotherapy from Dr. Hochrein: It would be suggested that given her past history of reduced EF, even though it has returned to low normal, that we should avoid anthracyclines if possible.    I will route this to Dr. Rao.    Cardiology Office Note:    Date:  10/24/2019   ID:  [name], DOB 02/27/1956, MRN [id number]  PCP:  Masoud, Javed, MD  Cardiologist:  James Hochrein, MD  Referring MD: Masoud, Javed, MD   Chief Complaint  Patient presents with  . Pre-op Exam    History of Present Illness:    [name] is a 62 y.o. female with a past medical history significant for hypertension, CKD, diabetes, nonischemic cardiomyopathy/chronic combined systolic and diastolic heart failure.  She was admitted in July 2015 with decompensated heart failure and worsening renal function in the setting of hypertensive urgency. Echocardiogram demonstrated EF 20-25. She has a true allergy to hydralazine and has not been able to tolerate carvedilol in the past. Nuclear stress testing demonstrated no scar or ischemia. Renal artery ultrasound in August 2015 was negative for renal artery stenosis. Echocardiogram in 2016 demonstrated improved LV function with an EF of 50-55 and grade 2 diastolic dysfunction. She has had medications reduced/discontinued due to low blood pressure.  Echocardiogram in 02/2019 showed low normal systolic function with EF 50-55% and impaired diastolic function.  The patient has been diagnosed with invasive mammary carcinoma and is here for Surgical clearance for left mastectomy.  Oncology is awaiting cardiology recommendations but doubted that patient would be a candidate for anthracycline treatment.  The patient was noted to have no distal metastasis on PET scan.  She did have a hypermetabolic left axillary lymph node.  Dr. Rao noted that the only advantage of offering neoadjuvant chemotherapy would be to downstage her axilla.  With her cardiac  history and ongoing arthritis Dr. Rao was concerned that the patient may not be able to tolerate chemotherapy without any purulence or complications.  She recommended mastectomy and lymph node dissection with possible adjuvant chemotherapy to follow.  She is not very active. She does housework and takes breaks as needed. She uses a cane for balance when she goes out. She has arthritis in her feet that makes walking difficult. This limits her activity moreso than her breathing. She sleeps on a flat bed with 1 king size  Pillow. She has swelling in her ankles, non-pitting that she says is related to RA vs gout. She is wearing compression stockings, but does not wear them every day. She denies chest pain or pressure.    Cardiac studies   Echocardiogram 02/26/2019 IMPRESSIONS   1. The left ventricle has low normal systolic function, with an ejection fraction of 50-55%. The cavity size was normal. There is moderately increased left ventricular wall thickness. Left ventricular diastolic Doppler parameters are consistent with  impaired relaxation. Elevated mean left atrial pressure.  2. The right ventricle has normal systolic function. The cavity was normal. There is no increase in right ventricular wall thickness.  3. Left atrial size was mildly dilated.  4. Mild thickening of the mitral valve leaflet.  5. The tricuspid valve is grossly normal.  6. The aortic valve is tricuspid Mild thickening of the aortic valve.  7. Normal LV systolic function; moderate LVH; mild diastolic dysfunction; mild LAE.   Past Medical History:  Diagnosis Date  . Arthritis of both feet   . Chronic combined systolic and diastolic CHF (  congestive heart failure) ([place])    a. Echo (06/2014): Severe LVH, EF 20-25%, no RWMA, Gr 3 DD, trivial MR, mild LAE, mild RVE, PASP 38 mmHg .>> b. Echo 2/16 EF 50-55% //  // c. Echo 02/2019: EF 50-55, mod LVH, Gr 1 DD, mild LAE   . CKD (chronic kidney disease), stage III   . Diabetes mellitus  type 2 in obese ([place])   . Diverticular disease   . Family history of breast cancer   . Family history of leukemia   . Family history of lung cancer   . Family history of testicular cancer   . Family history of throat cancer   . Fatty liver   . History of GI diverticular bleed   . Hyperlipidemia   . Hyperphosphatemia   . Hypertension   . Hypertensive heart disease    a. Renal Artery Duplex (8/15):  no RAS  . Iron deficiency anemia   . NICM (nonischemic cardiomyopathy) ([place])    a. Nuclear (06/2014): EF 32%, apical thinning, no definite scar or ischemia;  b. Echo (2/16):  Mild LVH, EF 50-55%, Gr 2 DD, no RWMA  . Obesity   . Proteinuria   . PUD (peptic ulcer disease)   . Secondary hyperparathyroidism, renal ([place])   . Tobacco abuse   . UTI (lower urinary tract infection)   . Vaginal dryness, menopausal   . Vaginal yeast infection     Past Surgical History:  Procedure Laterality Date  . DILATION AND CURETTAGE OF UTERUS    . None      Current Medications: Current Meds  Medication Sig  . acetaminophen (TYLENOL) 500 MG tablet Take 1,000 mg by mouth every 6 (six) hours as needed for mild pain, moderate pain or headache. For pain  . allopurinol (ZYLOPRIM) 100 MG tablet Take 200 mg by mouth daily.  Marland Kitchen amLODipine (NORVASC) 5 MG tablet Take 1 tablet (5 mg total) by mouth daily.  Marland Kitchen aspirin EC 81 MG EC tablet Take 1 tablet (81 mg total) by mouth daily.  Marland Kitchen atorvastatin (LIPITOR) 40 MG tablet Take 1 tablet (40 mg total) by mouth daily at 6 PM.  . B-D ULTRAFINE III SHORT PEN 31G X 8 MM MISC USE AS DIRECTED *EMERGENCY FILL*  . blood glucose meter kit and supplies KIT Dispense based on patient and insurance preference. Use up to four times daily as directed. (FOR ICD-9 250.00, 250.01).  . calcitRIOL (ROCALTROL) 0.25 MCG capsule Take 0.25 mcg by mouth daily.  . cloNIDine (CATAPRES) 0.1 MG tablet Take 1 tablet (0.1 mg total) by mouth 2 (two) times daily.  . Colchicine 0.6 MG CAPS Take by mouth  every other day.   . furosemide (LASIX) 40 MG tablet Take 40 mg by mouth 2 (two) times daily.  Marland Kitchen gabapentin (NEURONTIN) 100 MG capsule Take 100 mg by mouth 3 (three) times daily.  . insulin aspart (NOVOLOG FLEXPEN) 100 UNIT/ML FlexPen Inject 5 Units into the skin 3 (three) times daily with meals. Additional insulin (5 units PLUS) CBG 70 - 120: 0 units CBG 121 - 150: 1 unit CBG 151 - 200: 2 units CBG 201 - 250: 3 units CBG 251 - 350: 4 units CBG 351 - 400: 6 units  . insulin glargine (LANTUS) 100 UNIT/ML injection Inject into the skin daily. Inject 20 units into skin at 10pm  . iron polysaccharides (NU-IRON) 150 MG capsule Take 150 mg by mouth 2 (two) times daily.   . isosorbide mononitrate (IMDUR) 120 MG 24  hr tablet TAKE 1/2 TABLET DAILY = 60 MG DAILY  . metoprolol tartrate (LOPRESSOR) 25 MG tablet Take 1 tablet (25 mg total) by mouth 2 (two) times daily.  . ONE TOUCH ULTRA TEST test strip 1 each by Other route 4 (four) times daily.   Glory Rosebush DELICA LANCETS 60V MISC Inject 33 g into the skin 4 (four) times daily.   . Polyethylene Glycol 3350 (MIRALAX PO) Take by mouth daily as needed.   Marland Kitchen telmisartan (MICARDIS) 20 MG tablet Take 1 tablet (20 mg total) by mouth daily.     Allergies:   Hydralazine and Other   Social History   Socioeconomic History  . Marital status: Married    Spouse name: Not on file  . Number of children: 2  . Years of education: 7  . Highest education level: Not on file  Occupational History  . Not on file  Social Needs  . Financial resource strain: Not on file  . Food insecurity    Worry: Not on file    Inability: Not on file  . Transportation needs    Medical: Not on file    Non-medical: Not on file  Tobacco Use  . Smoking status: Former Smoker    Quit date: 07/05/2014    Years since quitting: 5.3  . Smokeless tobacco: Never Used  Substance and Sexual Activity  . Alcohol use: No  . Drug use: No  . Sexual activity: Not on file  Lifestyle  .  Physical activity    Days per week: Not on file    Minutes per session: Not on file  . Stress: Not on file  Relationships  . Social Herbalist on phone: Not on file    Gets together: Not on file    Attends religious service: Not on file    Active member of club or organization: Not on file    Attends meetings of clubs or organizations: Not on file    Relationship status: Not on file  Other Topics Concern  . Not on file  Social History Narrative   Currently lives in a house with her husband.    Fun: Watch TV.   Denies religious beliefs effecting health care.      Family History: The patient's family history includes Breast cancer in her sister; Coronary artery disease in an other family member; Diabetes in her mother; Heart attack in her paternal grandfather; Hypertension in her paternal grandmother and another family member; Kidney disease in her mother; Leukemia in her brother; Lung cancer in her paternal uncle; Pneumonia (age of onset: 2) in her sister; Stroke in her paternal grandmother; Testicular cancer in her father; Throat cancer in her brother. ROS:   Please see the history of present illness.     All other systems reviewed and are negative.   EKG:  EKG is ordered today.  The ekg ordered today demonstrates normal sinus rhythm, 70 bpm, mild T wave inversions in V5-V6 which have been present for the past couple of years.  Recent Labs: 10/09/2019: ALT 18; BUN 45; Creatinine, Ser 1.75; Hemoglobin 13.2; Platelets 250; Potassium 4.2; Sodium 141   Recent Lipid Panel    Component Value Date/Time   CHOL 112 01/31/2017 0751   TRIG 104 01/31/2017 0751   HDL 34 (L) 01/31/2017 0751   CHOLHDL 3.3 01/31/2017 0751   CHOLHDL 3.2 12/07/2015 0449   VLDL 23 12/07/2015 0449   LDLCALC 57 01/31/2017 0751    Physical Exam:  VS:  BP 100/66   Pulse 70   Ht 5' 1" (1.549 m)   Wt 193 lb (87.5 kg)   SpO2 97%   BMI 36.47 kg/m     Wt Readings from Last 6 Encounters:   10/24/19 193 lb (87.5 kg)  10/19/19 192 lb (87.1 kg)  10/09/19 191 lb (86.6 kg)  05/15/19 197 lb (89.4 kg)  02/14/19 204 lb (92.5 kg)  10/17/18 203 lb (92.1 kg)     Physical Exam  Constitutional: She is oriented to person, place, and time. No distress.  Obese female, walking with a cane  HENT:  Head: Normocephalic and atraumatic.  Neck: Normal range of motion. Neck supple. No JVD present.  Cardiovascular: Normal rate, regular rhythm, normal heart sounds and intact distal pulses. Exam reveals no gallop and no friction rub.  No murmur heard. Pulmonary/Chest: Effort normal and breath sounds normal. No respiratory distress. She has no wheezes. She has no rales.  Abdominal: Soft. Bowel sounds are normal.  Musculoskeletal:        General: Deformity and edema present.     Comments: Non-pitting puffy edema of bilateral ankles Bony deformities of both feet  Neurological: She is alert and oriented to person, place, and time.  Skin: Skin is warm and dry.  Psychiatric: She has a normal mood and affect. Her behavior is normal. Judgment and thought content normal.  Vitals reviewed.    ASSESSMENT:    1. Preop cardiovascular exam   2. Chronic heart failure with preserved ejection fraction (HCC)   3. Essential (primary) hypertension   4. Type 2 diabetes mellitus with stage 3b chronic kidney disease, with long-term current use of insulin (HCC)   5. Stage 3b chronic kidney disease    PLAN:    In order of problems listed above:  Preoperative evaluation -Patient is planned for left mastectomy for breast cancer -The patient has no known history of CAD but does have a history of nonischemic cardiomyopathy with EF down as low as 20-25% in the past but has improved. Echocardiogram in 02/2019 showed EF 50-55%. No current heart failure symptoms.  - According to the RCRI Liley Atkerson is a class III risk with 6.6% risk of major cardiac event perioperatively.  Her history of cardiomyopathy and diabetes on  insulin increase her risk.  Since her creatinine is below 2 recently, this is not counted as increased risk on the RCRI calculation. -The patients functional status is limited due to severe arthritis in her feet. This increases her risk as well.  -With recent echo, I do not thinks that further cardiac testing would be of any benefit. She may proceed with surgery. I discussed her slight increased risk with the patient. She will need close hemodynamic monitoring and watch her volume status closely.   -I will forward today's note to Dr. Hochrein for his input on possible adjuvant chemotherapy use with anthracycline.  Chronic heart failure with preserved EF -EF was previously as low as 20-25%.  Most recent echo in 02/2019 with EF of 50-55%. -Patient is maintained on Lasix 40 mg twice daily with extra Lasix as needed, uses rarely, sometimes not even once a month. Often goes several months without any. -Pt appears euvolemic.   Hypertension -On amlodipine 5 mg daily, clonidine 0.1 mg twice daily, Lasix 40 mg twice daily, Imdur 60 mg daily, telmisartan 20 mg daily, metoprolol 25 mg twice daily -BP is soft today. She denies lightheadedness.   Hyperlipidemia -On atorvastatin 40 mg daily.  Last   lipid panel in 01/2017 with LDL of 57.  Type 2 diabetes on insulin -Last A1c found 7.1 on 11/08/2018  CKD Stage 3b -SCr on most recent labs was 1.75 on 10/09/19 with eGRF of 30, SCr has been up to the 2 range in 2016.  -Followed by nephrology, Dr. Dunham  At [facility]  Medication Adjustments/Labs and Tests Ordered: Current medicines are reviewed at length with the patient today.  Concerns regarding medicines are outlined above. Labs and tests ordered and medication changes are outlined in the patient instructions below:  Patient Instructions  Medication Instructions:  Your physician recommends that you continue on your current medications as directed. Please refer to the Current Medication list given to  you today.  *If you need a refill on your cardiac medications before your next appointment, please call your pharmacy*  Lab Work: None   If you have labs (blood work) drawn today and your tests are completely normal, you will receive your results only by: . MyChart Message (if you have MyChart) OR . A paper copy in the mail If you have any lab test that is abnormal or we need to change your treatment, we will call you to review the results.  Testing/Procedures: None   Follow-Up: You are scheduled to see Scott Weaver PA-C on 01/29/2020 @ 3:15 PM  Other Instructions      Signed, Janine Hammond, NP  10/24/2019 5:11 PM    [facility] 

## 2019-10-26 ENCOUNTER — Ambulatory Visit
Admission: RE | Admit: 2019-10-26 | Discharge: 2019-10-26 | Disposition: A | Discharge status: Home / Self Care | Payer: PPO | Source: Ambulatory Visit | Attending: Oncology | Admitting: Oncology

## 2019-10-26 ENCOUNTER — Other Ambulatory Visit: Payer: Self-pay

## 2019-10-26 DIAGNOSIS — C50812 Malignant neoplasm of overlapping sites of left female breast: Secondary | ICD-10-CM | POA: Insufficient documentation

## 2019-10-26 DIAGNOSIS — C50912 Malignant neoplasm of unspecified site of left female breast: Secondary | ICD-10-CM | POA: Diagnosis not present

## 2019-10-26 DIAGNOSIS — Z170 Estrogen receptor positive status [ER+]: Secondary | ICD-10-CM | POA: Diagnosis not present

## 2019-10-26 MED ORDER — GADOBUTROL 1 MMOL/ML IV SOLN
8.0000 mL | Freq: Once | INTRAVENOUS | Status: AC | PRN
  Administered 2019-10-26: 8 mL via INTRAVENOUS

## 2019-10-31 ENCOUNTER — Other Ambulatory Visit: Payer: Self-pay | Admitting: Physician Assistant

## 2019-11-01 ENCOUNTER — Telehealth: Payer: Self-pay | Admitting: *Deleted

## 2019-11-01 MED ORDER — CLONIDINE HCL 0.1 MG PO TABS: 0.1000 mg | ORAL_TABLET | Freq: Two times a day (BID) | ORAL | 2 refills | Status: DC

## 2019-11-01 NOTE — Telephone Encounter (Signed)
Called md office and spoke to Amy and gave her the message that Dr. Janese Banks has got in touch with pt. But she needs surgery first and then chemo  Dr. Janese Banks wanted Dr. Marlou Starks to know this so he will call pt. And schedule appts to get surgery on board and scheduled. I gave amy Janese Banks cell phone if Dr. Marlou Starks needed to call and speak with her

## 2019-11-06 ENCOUNTER — Other Ambulatory Visit: Payer: Self-pay | Admitting: General Surgery

## 2019-11-06 ENCOUNTER — Ambulatory Visit: Payer: Self-pay | Admitting: General Surgery

## 2019-11-06 DIAGNOSIS — Z170 Estrogen receptor positive status [ER+]: Secondary | ICD-10-CM

## 2019-11-06 DIAGNOSIS — C50812 Malignant neoplasm of overlapping sites of left female breast: Secondary | ICD-10-CM

## 2019-11-06 DIAGNOSIS — C50512 Malignant neoplasm of lower-outer quadrant of left female breast: Secondary | ICD-10-CM

## 2019-11-06 NOTE — Progress Notes (Signed)
Patient for port placement 11/09/2019, called and gave pre procedure instructions, NPO after 0700 on 11/20, as well as be here at 1300 for procedure,stated understanding as well to have driver for her after procedure .

## 2019-11-08 ENCOUNTER — Other Ambulatory Visit (HOSPITAL_COMMUNITY)
Admission: RE | Admit: 2019-11-08 | Discharge: 2019-11-08 | Disposition: A | Discharge status: Home / Self Care | Payer: PPO | Source: Ambulatory Visit | Attending: General Surgery | Admitting: General Surgery

## 2019-11-08 DIAGNOSIS — Z01812 Encounter for preprocedural laboratory examination: Secondary | ICD-10-CM | POA: Insufficient documentation

## 2019-11-08 DIAGNOSIS — Z20828 Contact with and (suspected) exposure to other viral communicable diseases: Secondary | ICD-10-CM | POA: Diagnosis not present

## 2019-11-09 ENCOUNTER — Encounter (HOSPITAL_COMMUNITY): Payer: Self-pay | Admitting: *Deleted

## 2019-11-09 ENCOUNTER — Other Ambulatory Visit: Payer: Self-pay

## 2019-11-09 ENCOUNTER — Ambulatory Visit
Admission: RE | Admit: 2019-11-09 | Discharge: 2019-11-09 | Disposition: A | Discharge status: Home / Self Care | Payer: PPO | Source: Ambulatory Visit | Attending: General Surgery | Admitting: General Surgery

## 2019-11-09 DIAGNOSIS — C50512 Malignant neoplasm of lower-outer quadrant of left female breast: Secondary | ICD-10-CM

## 2019-11-09 DIAGNOSIS — Z170 Estrogen receptor positive status [ER+]: Secondary | ICD-10-CM

## 2019-11-09 LAB — NOVEL CORONAVIRUS, NAA (HOSP ORDER, SEND-OUT TO REF LAB; TAT 18-24 HRS): SARS-CoV-2, NAA: NOT DETECTED

## 2019-11-09 MED ORDER — CEFAZOLIN SODIUM-DEXTROSE 1-4 GM/50ML-% IV SOLN: 1.0000 g | Freq: Once | INTRAVENOUS | Status: DC

## 2019-11-09 NOTE — OR Nursing (Signed)
Pt Covid test done at [facility]. It was sent to [facility]. results still pending. [employer] [facility] called for results but they reported results would be 2-4 days. Dr Kathlene Cote explained to pt that we would need to delay port a cath insertion for today until results received. Since pt is scheduled for Mastectomy on 11/23 in [place] we will delay rescheduling the portacath insertion for this time. If Dr Marlou Starks doesn't insert the port during her mastectomy on Monday we will plan to call her Next week to reschedule. Pt and son aware of situation and understanding.

## 2019-11-09 NOTE — Progress Notes (Signed)
Spoke with pt for pre-op call. Pt has hx of an enlarged heart, sees Dr. Percival Spanish ([place], [place]). Cardiac clearance noted on 10/12/19 by Kathyrn Drown, NP. Pt denies any recent chest pain or sob. Pt is a type 2 diabetic. States her last A1C was 7.0 one month ago. She states her fasting blood sugar is usually 119-160. Instructed pt to take 1/2 of her regular dose of Lantus Insulin Sunday PM (she will take 10 units). Pt instructed to check her blood sugar when she gets up Monday AM and every 2 hours until she leaves for the hospital. If blood sugar is >220 take 1/2 of usual correction dose of Novolog insulin. If blood sugar is 70 or below, treat with 1/2 cup of clear juice (apple or cranberry) and recheck blood sugar 15 minutes after drinking juice. If blood sugar continues to be 70 or below, call the Short Stay department and ask to speak to a nurse. Pt voiced understanding.   Pt had Covid test done yesterday, result is pending. Pt states she has a procedure she has to go to this afternoon, but otherwise she will be in quarantine.   Pt informed of visitation policy and voiced understanding.

## 2019-11-11 ENCOUNTER — Other Ambulatory Visit: Payer: Self-pay | Admitting: Physician Assistant

## 2019-11-12 ENCOUNTER — Other Ambulatory Visit: Payer: Self-pay | Admitting: General Surgery

## 2019-11-12 ENCOUNTER — Ambulatory Visit (HOSPITAL_COMMUNITY): Payer: PPO | Admitting: Anesthesiology

## 2019-11-12 ENCOUNTER — Encounter (HOSPITAL_COMMUNITY)
Admission: RE | Disposition: A | Discharge status: Home / Self Care | Payer: Self-pay | Source: Ambulatory Visit | Attending: General Surgery

## 2019-11-12 ENCOUNTER — Ambulatory Visit
Admission: RE | Admit: 2019-11-12 | Discharge: 2019-11-12 | Disposition: A | Discharge status: Home / Self Care | Payer: PPO | Source: Ambulatory Visit | Attending: General Surgery | Admitting: General Surgery

## 2019-11-12 ENCOUNTER — Encounter (HOSPITAL_COMMUNITY): Payer: Self-pay

## 2019-11-12 ENCOUNTER — Ambulatory Visit (HOSPITAL_COMMUNITY)
Admission: RE | Admit: 2019-11-12 | Discharge: 2019-11-13 | Disposition: A | Discharge status: Home / Self Care | Payer: PPO | Source: Ambulatory Visit | Attending: General Surgery | Admitting: General Surgery

## 2019-11-12 ENCOUNTER — Other Ambulatory Visit: Payer: Self-pay

## 2019-11-12 ENCOUNTER — Ambulatory Visit (HOSPITAL_COMMUNITY)
Admission: RE | Admit: 2019-11-12 | Discharge: 2019-11-12 | Disposition: A | Discharge status: Home / Self Care | Payer: PPO | Source: Ambulatory Visit | Attending: General Surgery | Admitting: General Surgery

## 2019-11-12 DIAGNOSIS — Z794 Long term (current) use of insulin: Secondary | ICD-10-CM | POA: Diagnosis not present

## 2019-11-12 DIAGNOSIS — Z803 Family history of malignant neoplasm of breast: Secondary | ICD-10-CM | POA: Insufficient documentation

## 2019-11-12 DIAGNOSIS — Z170 Estrogen receptor positive status [ER+]: Secondary | ICD-10-CM

## 2019-11-12 DIAGNOSIS — Z6835 Body mass index (BMI) 35.0-35.9, adult: Secondary | ICD-10-CM | POA: Diagnosis not present

## 2019-11-12 DIAGNOSIS — C50812 Malignant neoplasm of overlapping sites of left female breast: Secondary | ICD-10-CM

## 2019-11-12 DIAGNOSIS — G8918 Other acute postprocedural pain: Secondary | ICD-10-CM | POA: Diagnosis not present

## 2019-11-12 DIAGNOSIS — N184 Chronic kidney disease, stage 4 (severe): Secondary | ICD-10-CM | POA: Insufficient documentation

## 2019-11-12 DIAGNOSIS — Z87891 Personal history of nicotine dependence: Secondary | ICD-10-CM | POA: Diagnosis not present

## 2019-11-12 DIAGNOSIS — C50912 Malignant neoplasm of unspecified site of left female breast: Secondary | ICD-10-CM | POA: Diagnosis present

## 2019-11-12 DIAGNOSIS — E669 Obesity, unspecified: Secondary | ICD-10-CM | POA: Insufficient documentation

## 2019-11-12 DIAGNOSIS — R590 Localized enlarged lymph nodes: Secondary | ICD-10-CM | POA: Diagnosis not present

## 2019-11-12 DIAGNOSIS — E785 Hyperlipidemia, unspecified: Secondary | ICD-10-CM | POA: Diagnosis not present

## 2019-11-12 DIAGNOSIS — C773 Secondary and unspecified malignant neoplasm of axilla and upper limb lymph nodes: Secondary | ICD-10-CM | POA: Insufficient documentation

## 2019-11-12 DIAGNOSIS — I5042 Chronic combined systolic (congestive) and diastolic (congestive) heart failure: Secondary | ICD-10-CM | POA: Diagnosis not present

## 2019-11-12 DIAGNOSIS — Z808 Family history of malignant neoplasm of other organs or systems: Secondary | ICD-10-CM | POA: Diagnosis not present

## 2019-11-12 DIAGNOSIS — E1122 Type 2 diabetes mellitus with diabetic chronic kidney disease: Secondary | ICD-10-CM | POA: Diagnosis not present

## 2019-11-12 DIAGNOSIS — I252 Old myocardial infarction: Secondary | ICD-10-CM | POA: Insufficient documentation

## 2019-11-12 DIAGNOSIS — Z801 Family history of malignant neoplasm of trachea, bronchus and lung: Secondary | ICD-10-CM | POA: Insufficient documentation

## 2019-11-12 DIAGNOSIS — C50512 Malignant neoplasm of lower-outer quadrant of left female breast: Secondary | ICD-10-CM | POA: Insufficient documentation

## 2019-11-12 DIAGNOSIS — I130 Hypertensive heart and chronic kidney disease with heart failure and stage 1 through stage 4 chronic kidney disease, or unspecified chronic kidney disease: Secondary | ICD-10-CM | POA: Diagnosis not present

## 2019-11-12 HISTORY — PX: MASTECTOMY W/ SENTINEL NODE BIOPSY: SHX2001

## 2019-11-12 HISTORY — DX: Other specified postprocedural states: R11.2

## 2019-11-12 HISTORY — DX: Dyspnea, unspecified: R06.00

## 2019-11-12 HISTORY — DX: Malignant (primary) neoplasm, unspecified: C80.1

## 2019-11-12 HISTORY — DX: Other specified postprocedural states: Z98.890

## 2019-11-12 LAB — GLUCOSE, CAPILLARY
Glucose-Capillary: 139 mg/dL — ABNORMAL HIGH (ref 70–99)
Glucose-Capillary: 131 mg/dL — ABNORMAL HIGH (ref 70–99)
Glucose-Capillary: 198 mg/dL — ABNORMAL HIGH (ref 70–99)
Glucose-Capillary: 343 mg/dL — ABNORMAL HIGH (ref 70–99)

## 2019-11-12 LAB — COMPREHENSIVE METABOLIC PANEL
ALT: 20 U/L (ref 0–44)
AST: 19 U/L (ref 15–41)
Albumin: 3.8 g/dL (ref 3.5–5.0)
Alkaline Phosphatase: 109 U/L (ref 38–126)
Anion gap: 12 (ref 5–15)
BUN: 39 mg/dL — ABNORMAL HIGH (ref 8–23)
CO2: 26 mmol/L (ref 22–32)
Calcium: 9.3 mg/dL (ref 8.9–10.3)
Chloride: 101 mmol/L (ref 98–111)
Creatinine, Ser: 1.77 mg/dL — ABNORMAL HIGH (ref 0.44–1.00)
GFR calc Af Amer: 35 mL/min — ABNORMAL LOW (ref >60)
GFR calc non Af Amer: 30 mL/min — ABNORMAL LOW (ref >60)
Glucose, Bld: 147 mg/dL — ABNORMAL HIGH (ref 70–99)
Potassium: 4.2 mmol/L (ref 3.5–5.1)
Sodium: 139 mmol/L (ref 135–145)
Total Bilirubin: 0.9 mg/dL (ref 0.3–1.2)
Total Protein: 7.8 g/dL (ref 6.5–8.1)

## 2019-11-12 LAB — CBC
HCT: 42.9 % (ref 36.0–46.0)
Hemoglobin: 13.5 g/dL (ref 12.0–15.0)
MCH: 26.4 pg (ref 26.0–34.0)
MCHC: 31.5 g/dL (ref 30.0–36.0)
MCV: 84 fL (ref 80.0–100.0)
Platelets: 254 10*3/uL (ref 150–400)
RBC: 5.11 MIL/uL (ref 3.87–5.11)
RDW: 16.4 % — ABNORMAL HIGH (ref 11.5–15.5)
WBC: 7.4 10*3/uL (ref 4.0–10.5)
nRBC: 0 % (ref 0.0–0.2)

## 2019-11-12 LAB — HEMOGLOBIN A1C
Hgb A1c MFr Bld: 7.4 % — ABNORMAL HIGH (ref 4.8–5.6)
Mean Plasma Glucose: 165.68 mg/dL

## 2019-11-12 LAB — PROTIME-INR
INR: 1 (ref 0.8–1.2)
Prothrombin Time: 12.6 seconds (ref 11.4–15.2)

## 2019-11-12 SURGERY — MASTECTOMY WITH SENTINEL LYMPH NODE BIOPSY
Anesthesia: General | Site: Breast | Laterality: Left

## 2019-11-12 MED ORDER — ONDANSETRON HCL 4 MG/2ML IJ SOLN
INTRAMUSCULAR | Status: AC
  Filled 2019-11-12: qty 2

## 2019-11-12 MED ORDER — OXYCODONE HCL 5 MG/5ML PO SOLN: 5.0000 mg | Freq: Once | ORAL | Status: DC | PRN

## 2019-11-12 MED ORDER — SODIUM CHLORIDE (PF) 0.9 % IJ SOLN
INTRAVENOUS | Status: DC | PRN
  Administered 2019-11-12: 5 mL

## 2019-11-12 MED ORDER — FENTANYL CITRATE (PF) 100 MCG/2ML IJ SOLN
50.0000 ug | Freq: Once | INTRAMUSCULAR | Status: AC
  Administered 2019-11-12: 10:00:00 50 ug via INTRAVENOUS

## 2019-11-12 MED ORDER — BUPIVACAINE LIPOSOME 1.3 % IJ SUSP
INTRAMUSCULAR | Status: DC | PRN
  Administered 2019-11-12: 10 mL

## 2019-11-12 MED ORDER — ISOSORBIDE MONONITRATE ER 60 MG PO TB24
60.0000 mg | ORAL_TABLET | Freq: Every day | ORAL | Status: DC
  Administered 2019-11-13: 60 mg via ORAL
  Filled 2019-11-12: qty 1

## 2019-11-12 MED ORDER — CELECOXIB 200 MG PO CAPS
ORAL_CAPSULE | ORAL | Status: AC
  Administered 2019-11-12: 200 mg
  Filled 2019-11-12: qty 1

## 2019-11-12 MED ORDER — FENTANYL CITRATE (PF) 250 MCG/5ML IJ SOLN
INTRAMUSCULAR | Status: DC | PRN
  Administered 2019-11-12: 50 ug via INTRAVENOUS

## 2019-11-12 MED ORDER — ONDANSETRON HCL 4 MG/2ML IJ SOLN
INTRAMUSCULAR | Status: DC | PRN
  Administered 2019-11-12: 4 mg via INTRAVENOUS

## 2019-11-12 MED ORDER — GABAPENTIN 100 MG PO CAPS
100.0000 mg | ORAL_CAPSULE | Freq: Three times a day (TID) | ORAL | Status: DC
  Administered 2019-11-12 – 2019-11-13 (×3): 100 mg via ORAL
  Filled 2019-11-12 (×3): qty 1

## 2019-11-12 MED ORDER — METOPROLOL TARTRATE 25 MG PO TABS
25.0000 mg | ORAL_TABLET | Freq: Two times a day (BID) | ORAL | Status: DC
  Administered 2019-11-12 – 2019-11-13 (×2): 25 mg via ORAL
  Filled 2019-11-12 (×2): qty 1

## 2019-11-12 MED ORDER — GABAPENTIN 300 MG PO CAPS
ORAL_CAPSULE | ORAL | Status: AC
  Filled 2019-11-12: qty 1

## 2019-11-12 MED ORDER — PROPOFOL 10 MG/ML IV BOLUS
INTRAVENOUS | Status: AC
  Filled 2019-11-12: qty 40

## 2019-11-12 MED ORDER — TECHNETIUM TC 99M SULFUR COLLOID FILTERED: 1.0000 | Freq: Once | INTRAVENOUS | Status: DC | PRN

## 2019-11-12 MED ORDER — HEPARIN SODIUM (PORCINE) 5000 UNIT/ML IJ SOLN
5000.0000 [IU] | Freq: Three times a day (TID) | INTRAMUSCULAR | Status: DC
  Administered 2019-11-13: 5000 [IU] via SUBCUTANEOUS
  Filled 2019-11-12: qty 1

## 2019-11-12 MED ORDER — SUGAMMADEX SODIUM 200 MG/2ML IV SOLN
INTRAVENOUS | Status: DC | PRN
  Administered 2019-11-12: 175 mg via INTRAVENOUS

## 2019-11-12 MED ORDER — PHENYLEPHRINE HCL-NACL 10-0.9 MG/250ML-% IV SOLN
INTRAVENOUS | Status: DC | PRN
  Administered 2019-11-12: 50 ug/min via INTRAVENOUS

## 2019-11-12 MED ORDER — ROCURONIUM BROMIDE 10 MG/ML (PF) SYRINGE
PREFILLED_SYRINGE | INTRAVENOUS | Status: AC
  Filled 2019-11-12: qty 10

## 2019-11-12 MED ORDER — PROPOFOL 10 MG/ML IV BOLUS
INTRAVENOUS | Status: DC | PRN
  Administered 2019-11-12: 90 mg via INTRAVENOUS

## 2019-11-12 MED ORDER — METHOCARBAMOL 500 MG PO TABS: 500.0000 mg | ORAL_TABLET | Freq: Four times a day (QID) | ORAL | Status: DC | PRN

## 2019-11-12 MED ORDER — ONDANSETRON HCL 4 MG/2ML IJ SOLN: 4.0000 mg | Freq: Four times a day (QID) | INTRAMUSCULAR | Status: DC | PRN

## 2019-11-12 MED ORDER — LIDOCAINE 2% (20 MG/ML) 5 ML SYRINGE
INTRAMUSCULAR | Status: AC
  Filled 2019-11-12: qty 5

## 2019-11-12 MED ORDER — AMLODIPINE BESYLATE 5 MG PO TABS
5.0000 mg | ORAL_TABLET | Freq: Every day | ORAL | Status: DC
  Filled 2019-11-12: qty 1

## 2019-11-12 MED ORDER — MIDAZOLAM HCL 2 MG/2ML IJ SOLN
1.0000 mg | Freq: Once | INTRAMUSCULAR | Status: AC
  Administered 2019-11-12: 10:00:00 1 mg via INTRAVENOUS

## 2019-11-12 MED ORDER — MIDAZOLAM HCL 2 MG/2ML IJ SOLN
INTRAMUSCULAR | Status: AC
  Administered 2019-11-12: 1 mg via INTRAVENOUS
  Filled 2019-11-12: qty 2

## 2019-11-12 MED ORDER — LACTATED RINGERS IV SOLN
INTRAVENOUS | Status: DC
  Administered 2019-11-12 (×2): via INTRAVENOUS

## 2019-11-12 MED ORDER — PANTOPRAZOLE SODIUM 40 MG IV SOLR
40.0000 mg | Freq: Every day | INTRAVENOUS | Status: DC
  Administered 2019-11-12: 40 mg via INTRAVENOUS
  Filled 2019-11-12: qty 40

## 2019-11-12 MED ORDER — OXYCODONE HCL 5 MG PO TABS: 5.0000 mg | ORAL_TABLET | Freq: Once | ORAL | Status: DC | PRN

## 2019-11-12 MED ORDER — CALCITRIOL 0.25 MCG PO CAPS
0.2500 ug | ORAL_CAPSULE | Freq: Every day | ORAL | Status: DC
  Administered 2019-11-12: 0.25 ug via ORAL
  Filled 2019-11-12 (×2): qty 1

## 2019-11-12 MED ORDER — PHENYLEPHRINE 40 MCG/ML (10ML) SYRINGE FOR IV PUSH (FOR BLOOD PRESSURE SUPPORT)
PREFILLED_SYRINGE | INTRAVENOUS | Status: DC | PRN
  Administered 2019-11-12: 80 ug via INTRAVENOUS
  Administered 2019-11-12: 120 ug via INTRAVENOUS
  Administered 2019-11-12: 80 ug via INTRAVENOUS

## 2019-11-12 MED ORDER — IRBESARTAN 75 MG PO TABS
75.0000 mg | ORAL_TABLET | Freq: Every day | ORAL | Status: DC
  Administered 2019-11-12 – 2019-11-13 (×2): 75 mg via ORAL
  Filled 2019-11-12 (×2): qty 1

## 2019-11-12 MED ORDER — FENTANYL CITRATE (PF) 100 MCG/2ML IJ SOLN: 25.0000 ug | INTRAMUSCULAR | Status: DC | PRN

## 2019-11-12 MED ORDER — DEXAMETHASONE SODIUM PHOSPHATE 10 MG/ML IJ SOLN
INTRAMUSCULAR | Status: DC | PRN
  Administered 2019-11-12: 10 mg via INTRAVENOUS

## 2019-11-12 MED ORDER — FUROSEMIDE 40 MG PO TABS
40.0000 mg | ORAL_TABLET | Freq: Two times a day (BID) | ORAL | Status: DC
  Administered 2019-11-12 – 2019-11-13 (×2): 40 mg via ORAL
  Filled 2019-11-12 (×2): qty 1

## 2019-11-12 MED ORDER — MIDAZOLAM HCL 2 MG/2ML IJ SOLN
INTRAMUSCULAR | Status: AC
  Filled 2019-11-12: qty 2

## 2019-11-12 MED ORDER — INSULIN ASPART 100 UNIT/ML ~~LOC~~ SOLN
0.0000 [IU] | Freq: Three times a day (TID) | SUBCUTANEOUS | Status: DC
  Administered 2019-11-12 – 2019-11-13 (×2): 2 [IU] via SUBCUTANEOUS

## 2019-11-12 MED ORDER — ROCURONIUM BROMIDE 50 MG/5ML IV SOSY
PREFILLED_SYRINGE | INTRAVENOUS | Status: DC | PRN
  Administered 2019-11-12: 80 mg via INTRAVENOUS

## 2019-11-12 MED ORDER — ACETAMINOPHEN 500 MG PO TABS
ORAL_TABLET | ORAL | Status: AC
  Administered 2019-11-12: 1000 mg
  Filled 2019-11-12: qty 2

## 2019-11-12 MED ORDER — INSULIN ASPART 100 UNIT/ML ~~LOC~~ SOLN
7.0000 [IU] | Freq: Once | SUBCUTANEOUS | Status: AC
  Administered 2019-11-12: 7 [IU] via SUBCUTANEOUS

## 2019-11-12 MED ORDER — DEXAMETHASONE SODIUM PHOSPHATE 10 MG/ML IJ SOLN
INTRAMUSCULAR | Status: AC
  Filled 2019-11-12: qty 1

## 2019-11-12 MED ORDER — INSULIN GLARGINE 100 UNIT/ML ~~LOC~~ SOLN
20.0000 [IU] | Freq: Every day | SUBCUTANEOUS | Status: DC
  Administered 2019-11-12: 20 [IU] via SUBCUTANEOUS
  Filled 2019-11-12 (×2): qty 0.2

## 2019-11-12 MED ORDER — FENTANYL CITRATE (PF) 100 MCG/2ML IJ SOLN
INTRAMUSCULAR | Status: AC
  Administered 2019-11-12: 50 ug via INTRAVENOUS
  Filled 2019-11-12: qty 2

## 2019-11-12 MED ORDER — ONDANSETRON 4 MG PO TBDP: 4.0000 mg | ORAL_TABLET | Freq: Four times a day (QID) | ORAL | Status: DC | PRN

## 2019-11-12 MED ORDER — ALLOPURINOL 100 MG PO TABS
200.0000 mg | ORAL_TABLET | Freq: Every day | ORAL | Status: DC
  Administered 2019-11-13: 200 mg via ORAL
  Filled 2019-11-12: qty 2

## 2019-11-12 MED ORDER — ONDANSETRON HCL 4 MG/2ML IJ SOLN
4.0000 mg | Freq: Once | INTRAMUSCULAR | Status: AC | PRN
  Administered 2019-11-12: 4 mg via INTRAVENOUS

## 2019-11-12 MED ORDER — CEFAZOLIN SODIUM-DEXTROSE 2-3 GM-%(50ML) IV SOLR
INTRAVENOUS | Status: DC | PRN
  Administered 2019-11-12: 2 g via INTRAVENOUS

## 2019-11-12 MED ORDER — HYDROCODONE-ACETAMINOPHEN 5-325 MG PO TABS: 1.0000 | ORAL_TABLET | ORAL | Status: DC | PRN

## 2019-11-12 MED ORDER — BUPIVACAINE HCL (PF) 0.5 % IJ SOLN
INTRAMUSCULAR | Status: DC | PRN
  Administered 2019-11-12: 15 mL

## 2019-11-12 MED ORDER — METHYLENE BLUE 0.5 % INJ SOLN
INTRAVENOUS | Status: AC
  Filled 2019-11-12: qty 10

## 2019-11-12 MED ORDER — MORPHINE SULFATE (PF) 2 MG/ML IV SOLN: 1.0000 mg | INTRAVENOUS | Status: DC | PRN

## 2019-11-12 MED ORDER — LIDOCAINE 2% (20 MG/ML) 5 ML SYRINGE
INTRAMUSCULAR | Status: DC | PRN
  Administered 2019-11-12: 60 mg via INTRAVENOUS

## 2019-11-12 MED ORDER — COLCHICINE 0.6 MG PO TABS
0.6000 mg | ORAL_TABLET | ORAL | Status: DC
  Administered 2019-11-13: 0.6 mg via ORAL
  Filled 2019-11-12 (×2): qty 1

## 2019-11-12 MED ORDER — 0.9 % SODIUM CHLORIDE (POUR BTL) OPTIME
TOPICAL | Status: DC | PRN
  Administered 2019-11-12: 1000 mL

## 2019-11-12 MED ORDER — SODIUM CHLORIDE 0.9 % IV SOLN
INTRAVENOUS | Status: DC
  Administered 2019-11-12: 15:00:00 via INTRAVENOUS

## 2019-11-12 MED ORDER — CEFAZOLIN SODIUM-DEXTROSE 2-4 GM/100ML-% IV SOLN
INTRAVENOUS | Status: AC
  Filled 2019-11-12: qty 100

## 2019-11-12 MED ORDER — ATORVASTATIN CALCIUM 40 MG PO TABS
40.0000 mg | ORAL_TABLET | Freq: Every day | ORAL | Status: DC
  Administered 2019-11-12: 17:00:00 40 mg via ORAL
  Filled 2019-11-12: qty 1

## 2019-11-12 MED ORDER — FENTANYL CITRATE (PF) 250 MCG/5ML IJ SOLN
INTRAMUSCULAR | Status: AC
  Filled 2019-11-12: qty 5

## 2019-11-12 SURGICAL SUPPLY — 59 items
APPLIER CLIP 11 MED OPEN (CLIP) ×2
APPLIER CLIP 9.375 MED OPEN (MISCELLANEOUS) ×2
BINDER BREAST LRG (GAUZE/BANDAGES/DRESSINGS) ×1 IMPLANT
BINDER BREAST XLRG (GAUZE/BANDAGES/DRESSINGS) IMPLANT
BIOPATCH RED 1 DISK 7.0 (GAUZE/BANDAGES/DRESSINGS) ×2 IMPLANT
CANISTER SUCT 3000ML PPV (MISCELLANEOUS) ×2 IMPLANT
CHLORAPREP W/TINT 26 (MISCELLANEOUS) ×2 IMPLANT
CLIP APPLIE 11 MED OPEN (CLIP) IMPLANT
CLIP APPLIE 9.375 MED OPEN (MISCELLANEOUS) ×1 IMPLANT
CONT SPEC 4OZ CLIKSEAL STRL BL (MISCELLANEOUS) ×2 IMPLANT
COVER PROBE W GEL 5X96 (DRAPES) ×2 IMPLANT
COVER SURGICAL LIGHT HANDLE (MISCELLANEOUS) ×2 IMPLANT
COVER WAND RF STERILE (DRAPES) ×2 IMPLANT
DERMABOND ADVANCED (GAUZE/BANDAGES/DRESSINGS) ×1
DERMABOND ADVANCED .7 DNX12 (GAUZE/BANDAGES/DRESSINGS) ×1 IMPLANT
DEVICE DISSECT PLASMABLAD 3.0S (MISCELLANEOUS) IMPLANT
DRAIN CHANNEL 19F RND (DRAIN) ×3 IMPLANT
DRAPE CHEST BREAST 15X10 FENES (DRAPES) ×2 IMPLANT
DRSG PAD ABDOMINAL 8X10 ST (GAUZE/BANDAGES/DRESSINGS) ×2 IMPLANT
DRSG TEGADERM 4X4.5 CHG (GAUZE/BANDAGES/DRESSINGS) ×1 IMPLANT
DRSG TEGADERM 4X4.75 (GAUZE/BANDAGES/DRESSINGS) ×2 IMPLANT
ELECT CAUTERY BLADE 6.4 (BLADE) ×2 IMPLANT
ELECT REM PT RETURN 9FT ADLT (ELECTROSURGICAL) ×2
ELECTRODE REM PT RTRN 9FT ADLT (ELECTROSURGICAL) ×1 IMPLANT
EVACUATOR SILICONE 100CC (DRAIN) ×4 IMPLANT
FILTER IN LINE W/DETACHED HOSE (FILTER) ×2 IMPLANT
GAUZE SPONGE 4X4 12PLY STRL (GAUZE/BANDAGES/DRESSINGS) ×1 IMPLANT
GLOVE BIO SURGEON STRL SZ7.5 (GLOVE) ×2 IMPLANT
GOWN STRL REUS W/ TWL LRG LVL3 (GOWN DISPOSABLE) ×2 IMPLANT
GOWN STRL REUS W/TWL LRG LVL3 (GOWN DISPOSABLE) ×2
KIT BASIN OR (CUSTOM PROCEDURE TRAY) ×2 IMPLANT
KIT TURNOVER KIT B (KITS) ×2 IMPLANT
LIGHT WAVEGUIDE WIDE FLAT (MISCELLANEOUS) IMPLANT
NDL 18GX1X1/2 (RX/OR ONLY) (NEEDLE) IMPLANT
NDL FILTER BLUNT 18X1 1/2 (NEEDLE) IMPLANT
NDL HYPO 25GX1X1/2 BEV (NEEDLE) IMPLANT
NEEDLE 18GX1X1/2 (RX/OR ONLY) (NEEDLE) IMPLANT
NEEDLE FILTER BLUNT 18X 1/2SAF (NEEDLE)
NEEDLE FILTER BLUNT 18X1 1/2 (NEEDLE) IMPLANT
NEEDLE HYPO 25GX1X1/2 BEV (NEEDLE) IMPLANT
NS IRRIG 1000ML POUR BTL (IV SOLUTION) ×2 IMPLANT
PACK GENERAL/GYN (CUSTOM PROCEDURE TRAY) ×2 IMPLANT
PAD ARMBOARD 7.5X6 YLW CONV (MISCELLANEOUS) ×2 IMPLANT
PENCIL SMOKE EVACUATOR (MISCELLANEOUS) ×2 IMPLANT
PLASMABLADE 3.0S (MISCELLANEOUS) ×2
SPECIMEN JAR X LARGE (MISCELLANEOUS) ×2 IMPLANT
SUT ETHILON 2 0 FS 18 (SUTURE) ×1 IMPLANT
SUT ETHILON 3 0 FSL (SUTURE) ×2 IMPLANT
SUT MNCRL AB 4-0 PS2 18 (SUTURE) ×2 IMPLANT
SUT VIC AB 0 CT1 27 (SUTURE) ×2
SUT VIC AB 0 CT1 27XBRD ANBCTR (SUTURE) ×2 IMPLANT
SUT VIC AB 3-0 54X BRD REEL (SUTURE) IMPLANT
SUT VIC AB 3-0 BRD 54 (SUTURE) ×1
SUT VIC AB 3-0 SH 18 (SUTURE) ×2 IMPLANT
SUT VICRYL AB 2 0 TIES (SUTURE) ×1 IMPLANT
SYR CONTROL 10ML LL (SYRINGE) IMPLANT
TOWEL GREEN STERILE (TOWEL DISPOSABLE) ×2 IMPLANT
TOWEL GREEN STERILE FF (TOWEL DISPOSABLE) ×2 IMPLANT
TUBE CONNECTING 12X1/4 (SUCTIONS) IMPLANT

## 2019-11-12 NOTE — Transfer of Care (Signed)
Immediate Anesthesia Transfer of Care Note  Patient: [name]  Procedure(s) Performed: LEFT MASTECTOMY WITH SENTINEL LYMPH NODE BIOPSY AND TARGETED NODE DISSECTION (Left Breast)  Patient Location: PACU  Anesthesia Type:General  Level of Consciousness: awake, alert  and oriented  Airway & Oxygen Therapy: Patient Spontanous Breathing  Post-op Assessment: Report given to RN and Post -op Vital signs reviewed and stable  Post vital signs: Reviewed and stable  Last Vitals:  Vitals Value Taken Time  BP 118/69 11/12/19 1245  Temp 36.1 C 11/12/19 1245  Pulse 63 11/12/19 1247  Resp 23 11/12/19 1247  SpO2 96 % 11/12/19 1247  Vitals shown include unvalidated device data.  Last Pain:  Vitals:   11/12/19 0953  TempSrc:   PainSc: 0-No pain         Complications: No apparent anesthesia complications

## 2019-11-12 NOTE — Anesthesia Procedure Notes (Signed)
Procedure Name: Intubation Date/Time: 11/12/2019 10:47 AM Performed by: Griffin Dakin, CRNA Pre-anesthesia Checklist: Patient identified, Emergency Drugs available, Suction available and Patient being monitored Patient Re-evaluated:Patient Re-evaluated prior to induction Oxygen Delivery Method: Circle system utilized Preoxygenation: Pre-oxygenation with 100% oxygen Induction Type: IV induction Laryngoscope Size: Mac and 4 Grade View: Grade I Tube type: Oral Tube size: 7.0 mm Number of attempts: 1 Airway Equipment and Method: Stylet and Oral airway Placement Confirmation: ETT inserted through vocal cords under direct vision,  positive ETCO2 and breath sounds checked- equal and bilateral Secured at: 21 cm Tube secured with: Tape Dental Injury: Teeth and Oropharynx as per pre-operative assessment  Comments: Modified RSI due to nausea

## 2019-11-12 NOTE — Interval H&P Note (Signed)
History and Physical Interval Note:  11/12/2019 9:30 AM  [name]  has presented today for surgery, with the diagnosis of LEFT BREAST CANCER.  The various methods of treatment have been discussed with the patient and family. After consideration of risks, benefits and other options for treatment, the patient has consented to  Procedure(s): LEFT MASTECTOMY WITH SENTINEL LYMPH NODE BIOPSY AND TARGETED NODE DISSECTION (Left) as a surgical intervention.  The patient's history has been reviewed, patient examined, no change in status, stable for surgery.  I have reviewed the patient's chart and labs.  Questions were answered to the patient's satisfaction.     Autumn Messing III

## 2019-11-12 NOTE — Op Note (Signed)
11/12/2019  12:34 PM  PATIENT:  [name]  63 y.o. female  PRE-OPERATIVE DIAGNOSIS:  LEFT BREAST CANCER  POST-OPERATIVE DIAGNOSIS:  LEFT BREAST CANCER  PROCEDURE:  Procedure(s): LEFT MASTECTOMY WITH  TARGETED NODE DISSECTION AND COMPLETION LEFT AXILLARY LYMPH NODE DISSECTION  SURGEON:  Surgeon(s) and Role:    * Jovita Kussmaul, MD - Primary  PHYSICIAN ASSISTANT:   ASSISTANTS: none   ANESTHESIA:   general  EBL:  50 mL   BLOOD ADMINISTERED:none  DRAINS: (2) Jackson-Pratt drain(s) with closed bulb suction in the prepectoral space   LOCAL MEDICATIONS USED:  NONE  SPECIMEN:  Source of Specimen:  left mastectomy and axillary contents  DISPOSITION OF SPECIMEN:  PATHOLOGY  COUNTS:  YES  TOURNIQUET:  * No tourniquets in log *  DICTATION: .Dragon Dictation   After informed consent was obtained the patient was brought to the operating room and placed in the supine position on the operating table.  After adequate induction of general anesthesia the patient's left chest, breast, and axillary area were prepped with ChloraPrep, allowed to dry, and draped in usual sterile manner.  An appropriate timeout was performed.  Previously an I-125 seed was placed in the left axilla to target the previously biopsied positive node.  Earlier in the day the patient also underwent injection 1 mCi of technetium sulfur colloid in the subareolar position on the left.  2 cc of methylene blue and 3 cc of injectable saline were also injected in the subareolar area.  The neoprobe was set to technetium and there was not much signal in the left axilla.  The neoprobe was then set to I-125 and I was readily able to identify the location of the radioactive seed in the left axilla.  The patient had a large palpable cancer.  An elliptical incision was made around the nipple and areola complex in order to minimize the excess skin and get around the palpable cancer.  The incision was carried through the skin and  subcutaneous tissue sharply with the electrocautery.  Breast hooks were then used to elevate the skin flaps anteriorly towards the ceiling.  Thin skin flaps were then created circumferentially by dissecting between the breast tissue and the subcutaneous fat.  This dissection was carried all the way to the chest wall circumferentially.  Next the breast was removed from the pectoralis muscle with the pectoralis fascia.  Once the dissection was carried laterally to the axilla was able to palpate several large hard lymph nodes.  Because of the distribution of these palpable hard lymph nodes I ended up having to do a full lymph node dissection.  The boundaries of the axilla were identified being the latissimus muscle laterally, the serratus muscle medially, and the axillary vein superiorly.  The lymphatic contents within these boundaries was dissected out by blunt right angle dissection and some sharp dissection with the electrocautery.  Several small vessels and intercostal brachial nerves were controlled with clips.  The long thoracic and thoracodorsal nerves were identified and spared.  Once this was accomplished the breast with the axillary contents were removed en bloc.  The neoprobe was used to identify the targeted node and this was separated from the rest of the axillary contents and sent separately.  Hemostasis was then achieved using the Bovie electrocautery.  The wound was irrigated with copious amounts of saline.  2 small stab incisions were made near the anterior axillary line inferior to the operative bed with a 15 blade knife.  A tonsil clamp  was placed through each of these openings and used to bring a 19 [place] round Blake drain into the operative bed.  The medial drain was curled along the chest wall and the lateral drain was placed in the axilla.  The drains were anchored to the skin with 3-0 nylon stitches.  Next the superior and inferior skin flaps were grossly reapproximated with interrupted 3-0  Vicryl stitches.  The skin was then closed with a running 4-0 Monocryl subcuticular stitch.  Dermabond dressings were applied.  The drains were placed to bulb suction and there was a good seal.  At the end of the case all needle sponge and instrument counts were correct.  The patient was then awakened and taken to recovery in stable condition.  PLAN OF CARE: Admit for overnight observation  PATIENT DISPOSITION:  PACU - hemodynamically stable.   Delay start of Pharmacological VTE agent (>24hrs) due to surgical blood loss or risk of bleeding: no

## 2019-11-12 NOTE — Anesthesia Procedure Notes (Signed)
Anesthesia Regional Block: Pectoralis block   Pre-Anesthetic Checklist: ,, timeout performed, Correct Patient, Correct Site, Correct Laterality, Correct Procedure, Correct Position, site marked, Risks and benefits discussed,  Surgical consent,  Pre-op evaluation,  At surgeon's request and post-op pain management  Laterality: Left  Prep: chloraprep       Needles:  Injection technique: Single-shot  Needle Type: Echogenic Needle     Needle Length: 10cm  Needle Gauge: 21     Additional Needles:   Narrative:  Start time: 11/12/2019 10:09 AM End time: 11/12/2019 10:13 AM Injection made incrementally with aspirations every 5 mL.  Performed by: Personally  Anesthesiologist: Audry Pili, MD  Additional Notes: No pain on injection. No increased resistance to injection. Injection made in 5cc increments. Good needle visualization. Patient tolerated the procedure well.

## 2019-11-12 NOTE — H&P (Signed)
[name] is an 62 y.o. female.   Chief Complaint: breast cancer HPI: The patient is a 63 year old white female who was sent to [place] by Dr. Janese Banks to evaluate [name] for a new left breast cancer.  She presents with a palpable mass in the outer aspect of the left breast since April.  She was unable to get a mammogram at that time because of Covid.  Since that time the mass has continued to grow.  [name] recent imaging shows a 5 cm mass in 1 abnormal lymph node in the left breast and axilla.  Both were biopsied and were positive.  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  She does have a history of significant congestive heart failure and is followed by a cardiologist here in [place].  She would prefer to have [name] surgery done here in [place].  Past Medical History:  Diagnosis Date  . Arthritis of both feet    Gout  . Cancer [facility])    Breast Cancer on left  . Chronic combined systolic and diastolic CHF (congestive heart failure) ([place])    a. Echo (06/2014): Severe LVH, EF 20-25%, no RWMA, Gr 3 DD, trivial MR, mild LAE, mild RVE, PASP 38 mmHg .>> b. Echo 2/16 EF 50-55% //  // c. Echo 02/2019: EF 50-55, mod LVH, Gr 1 DD, mild LAE   . CKD (chronic kidney disease), stage III    now stage 4  . Diabetes mellitus type 2 in obese ([place])   . Diverticular disease   . Family history of breast cancer   . Family history of leukemia   . Family history of lung cancer   . Family history of testicular cancer   . Family history of throat cancer   . Fatty liver   . History of GI diverticular bleed   . Hyperlipidemia   . Hyperphosphatemia   . Hypertension   . Hypertensive heart disease    a. Renal Artery Duplex (8/15):  no RAS  . Iron deficiency anemia   . NICM (nonischemic cardiomyopathy) ([place])    a. Nuclear (06/2014): EF 32%, apical thinning, no definite scar or ischemia;  b. Echo (2/16):  Mild LVH, EF 50-55%, Gr 2 DD, no RWMA  . Obesity   . PONV (postoperative nausea  and vomiting)    after D and C  . Proteinuria   . PUD (peptic ulcer disease)   . Secondary hyperparathyroidism, renal ([place])   . Tobacco abuse   . UTI (lower urinary tract infection)   . Vaginal dryness, menopausal   . Vaginal yeast infection     Past Surgical History:  Procedure Laterality Date  . COLONOSCOPY    . DILATION AND CURETTAGE OF UTERUS    . None      Family History  Problem Relation Age of Onset  . Heart attack Paternal Grandfather   . Stroke Paternal Grandmother   . Hypertension Paternal Grandmother   . Kidney disease Mother   . Diabetes Mother   . Testicular cancer Father        diagnosed older than 50  . Coronary artery disease Other   . Hypertension Other   . Breast cancer Sister        diagnosed late 55s, negative genetics  . Leukemia Brother        chronic  . Throat cancer Brother   . Pneumonia Sister 2  . Lung cancer Paternal Uncle  diagnosed over 50   Social History:  reports that she quit smoking about 5 years ago. She has never used smokeless tobacco. She reports that she does not drink alcohol or use drugs.  Allergies:  Allergies  Allergen Reactions  . Hydralazine Swelling    Eye Swelling  . Other Other (See Comments)    Unknown blood pressure medication caused face swelling    No medications prior to admission.    No results found for this or any previous visit (from the past 48 hour(s)). No results found.  Review of Systems  Constitutional: Negative.   HENT: Negative.   Eyes: Negative.   Respiratory: Negative.   Cardiovascular: Negative.   Gastrointestinal: Negative.   Genitourinary: Negative.   Musculoskeletal: Negative.   Skin: Negative.   Neurological: Negative.   Endo/Heme/Allergies: Negative.   Psychiatric/Behavioral: Negative.     There were no vitals taken for this visit. Physical Exam  Constitutional: She is oriented to person, place, and time. No distress.  Obese wf in nad  HENT:  Head: Normocephalic and  atraumatic.  Mouth/Throat: No oropharyngeal exudate.  Eyes: Pupils are equal, round, and reactive to light. Conjunctivae and EOM are normal.  Neck: Normal range of motion. Neck supple.  Cardiovascular: Normal rate, regular rhythm and normal heart sounds.  Respiratory: Effort normal and breath sounds normal. No stridor. No respiratory distress.  GI: Soft. Bowel sounds are normal. She exhibits no distension. There is no abdominal tenderness.  Musculoskeletal: Normal range of motion.        General: No tenderness or edema.  Neurological: She is alert and oriented to person, place, and time. Coordination normal.  Skin: Skin is warm and dry. No rash noted.  Psychiatric: She has a normal mood and affect. [name] behavior is normal. Thought content normal.     Assessment/Plan The patient appears to have a large 5 cm cancer in the lower outer left breast with a single positive lymph node.  I have talked to [name] in detail about the different options for treatment.  I have also talked to [name] oncologist and they do not favor neoadjuvant chemotherapy.  Because of this I suspect that [name] best option would be a mastectomy.  Since she only appears to have 1 abnormal lymph node I think it would be reasonable to do a targeted node dissection as well as sentinel node mapping.  Given the lobular nature of the cancer we will also have [name] undergo an MRI study.  She will need cardiac clearance from Dr. Weaver and Dr. Hochrein.  I have discussed with [name] in detail the risks and benefits of the operation as well as some of the technical aspects and she understands and wishes to proceed  Paul Toth III, MD 11/12/2019, 8:24 AM   

## 2019-11-12 NOTE — Anesthesia Preprocedure Evaluation (Addendum)
Anesthesia Evaluation  Patient identified by MRN, date of birth, ID band Patient awake    Reviewed: Allergy & Precautions, NPO status , Patient's Chart, lab work & pertinent test results, reviewed documented beta blocker date and time   History of Anesthesia Complications (+) PONV and history of anesthetic complications  Airway Mallampati: II  TM Distance: >3 FB Neck ROM: Full    Dental  (+) Dental Advisory Given, Chipped   Pulmonary shortness of breath, former smoker,    Pulmonary exam normal        Cardiovascular hypertension, Pt. on medications and Pt. on home beta blockers + Past MI and +CHF  Normal cardiovascular exam   '20 TTE - EF 50-55%. There is moderately increased left ventricular wall thickness. Left ventricular diastolic Doppler parameters are consistent with impaired relaxation. Elevated mean left atrial pressure. Left atrial size was mildly dilated.    Neuro/Psych negative neurological ROS  negative psych ROS   GI/Hepatic Neg liver ROS, PUD,   Endo/Other  diabetes, Type 2, Insulin Dependent Obesity   Renal/GU CRFRenal disease     Musculoskeletal  (+) Arthritis ,   Abdominal (+) + obese,   Peds  Hematology negative hematology ROS (+)   Anesthesia Other Findings   Reproductive/Obstetrics  Breast cancer                             Anesthesia Physical Anesthesia Plan  ASA: III  Anesthesia Plan: General   Post-op Pain Management:  Regional for Post-op pain   Induction: Intravenous  PONV Risk Score and Plan: 4 or greater and Treatment may vary due to age or medical condition, Ondansetron, Scopolamine patch - Pre-op and Dexamethasone  Airway Management Planned: Oral ETT  Additional Equipment: None  Intra-op Plan:   Post-operative Plan: Extubation in OR  Informed Consent: I have reviewed the patients History and Physical, chart, labs and discussed the procedure  including the risks, benefits and alternatives for the proposed anesthesia with the patient or authorized representative who has indicated his/her understanding and acceptance.     Dental advisory given  Plan Discussed with: CRNA and Anesthesiologist  Anesthesia Plan Comments:        Anesthesia Quick Evaluation

## 2019-11-13 ENCOUNTER — Encounter (HOSPITAL_COMMUNITY): Payer: Self-pay | Admitting: General Practice

## 2019-11-13 DIAGNOSIS — C50512 Malignant neoplasm of lower-outer quadrant of left female breast: Secondary | ICD-10-CM | POA: Diagnosis not present

## 2019-11-13 LAB — GLUCOSE, CAPILLARY
Glucose-Capillary: 175 mg/dL — ABNORMAL HIGH (ref 70–99)
Glucose-Capillary: 191 mg/dL — ABNORMAL HIGH (ref 70–99)
Glucose-Capillary: 309 mg/dL — ABNORMAL HIGH (ref 70–99)

## 2019-11-13 MED ORDER — HYDROCODONE-ACETAMINOPHEN 5-325 MG PO TABS: 1.0000 | ORAL_TABLET | Freq: Four times a day (QID) | ORAL | 0 refills | Status: DC | PRN

## 2019-11-13 MED ORDER — INSULIN ASPART 100 UNIT/ML ~~LOC~~ SOLN
5.0000 [IU] | Freq: Three times a day (TID) | SUBCUTANEOUS | Status: DC
  Administered 2019-11-13: 5 [IU] via SUBCUTANEOUS

## 2019-11-13 NOTE — Progress Notes (Signed)
Discussed discharge summary with patient. Reviewed all medications with patient. Educated patient on JP drain. Supplies sent home with patient. Patient ready for discharge.

## 2019-11-13 NOTE — Progress Notes (Signed)
[name] is a 63 y.o. female patient. No diagnosis found. Past Medical History:  Diagnosis Date  . Arthritis of both feet    Gout  . Cancer [facility])    Breast Cancer on left  . Chronic combined systolic and diastolic CHF (congestive heart failure) ([place])    a. Echo (06/2014): Severe LVH, EF 20-25%, no RWMA, Gr 3 DD, trivial MR, mild LAE, mild RVE, PASP 38 mmHg .>> b. Echo 2/16 EF 50-55% //  // c. Echo 02/2019: EF 50-55, mod LVH, Gr 1 DD, mild LAE   . CKD (chronic kidney disease), stage III    now stage 4  . Diabetes mellitus type 2 in obese ([place])   . Diverticular disease   . Dyspnea   . Family history of breast cancer   . Family history of leukemia   . Family history of lung cancer   . Family history of testicular cancer   . Family history of throat cancer   . Fatty liver   . History of GI diverticular bleed   . Hyperlipidemia   . Hyperphosphatemia   . Hypertension   . Hypertensive heart disease    a. Renal Artery Duplex (8/15):  no RAS  . Iron deficiency anemia   . NICM (nonischemic cardiomyopathy) ([place])    a. Nuclear (06/2014): EF 32%, apical thinning, no definite scar or ischemia;  b. Echo (2/16):  Mild LVH, EF 50-55%, Gr 2 DD, no RWMA  . Obesity   . PONV (postoperative nausea and vomiting)    after D and C  . Proteinuria   . PUD (peptic ulcer disease)   . Secondary hyperparathyroidism, renal ([place])   . Tobacco abuse   . UTI (lower urinary tract infection)   . Vaginal dryness, menopausal   . Vaginal yeast infection    Current Facility-Administered Medications  Medication Dose Route Frequency Provider Last Rate Last Dose  . 0.9 %  sodium chloride infusion   Intravenous Continuous Autumn Messing III, MD 10 mL/hr at 11/13/19 0344    . allopurinol (ZYLOPRIM) tablet 200 mg  200 mg Oral Daily Autumn Messing III, MD      . amLODipine (NORVASC) tablet 5 mg  5 mg Oral Daily Autumn Messing III, MD      . atorvastatin (LIPITOR) tablet 40 mg  40 mg Oral q1800 Autumn Messing III, MD   40 mg at  11/12/19 1719  . calcitRIOL (ROCALTROL) capsule 0.25 mcg  0.25 mcg Oral Daily Autumn Messing III, MD   0.25 mcg at 11/12/19 1502  . colchicine tablet 0.6 mg  0.6 mg Oral Lubertha South III, MD      . furosemide (LASIX) tablet 40 mg  40 mg Oral BID Autumn Messing III, MD   40 mg at 11/12/19 1719  . gabapentin (NEURONTIN) capsule 100 mg  100 mg Oral TID Autumn Messing III, MD   100 mg at 11/12/19 2141  . heparin injection 5,000 Units  5,000 Units Subcutaneous Q8H Autumn Messing III, MD   5,000 Units at 11/13/19 [id number]  . HYDROcodone-acetaminophen (NORCO/VICODIN) 5-325 MG per tablet 1-2 tablet  1-2 tablet Oral Q4H PRN Autumn Messing III, MD      . insulin aspart (novoLOG) injection 0-9 Units  0-9 Units Subcutaneous TID WC Jovita Kussmaul, MD   2 Units at 11/12/19 1719  . insulin aspart (novoLOG) injection 5 Units  5 Units Subcutaneous TID AC Georganna Skeans, MD      . insulin glargine (LANTUS) injection 20  Units  20 Units Subcutaneous QHS Georganna Skeans, MD   20 Units at 11/12/19 2358  . irbesartan (AVAPRO) tablet 75 mg  75 mg Oral Daily Autumn Messing III, MD   75 mg at 11/12/19 1447  . isosorbide mononitrate (IMDUR) 24 hr tablet 60 mg  60 mg Oral Daily Autumn Messing III, MD      . methocarbamol (ROBAXIN) tablet 500 mg  500 mg Oral Q6H PRN Autumn Messing III, MD      . metoprolol tartrate (LOPRESSOR) tablet 25 mg  25 mg Oral BID Autumn Messing III, MD   25 mg at 11/12/19 2141  . morphine 2 MG/ML injection 1-2 mg  1-2 mg Intravenous Q1H PRN Autumn Messing III, MD      . ondansetron (ZOFRAN-ODT) disintegrating tablet 4 mg  4 mg Oral Q6H PRN Autumn Messing III, MD       Or  . ondansetron [facility]) injection 4 mg  4 mg Intravenous Q6H PRN Autumn Messing III, MD      . pantoprazole (PROTONIX) injection 40 mg  40 mg Intravenous QHS Autumn Messing III, MD   40 mg at 11/12/19 2139   Facility-Administered Medications Ordered in Other Encounters  Medication Dose Route Frequency Provider Last Rate Last Dose  . technetium sulfur colloid ([place]) filtered  injection solution 1 millicurie  1 millicurie Intradermal Once PRN Gus Height, MD       Allergies  Allergen Reactions  . Hydralazine Swelling    Eye Swelling  . Other Other (See Comments)    Unknown blood pressure medication caused face swelling   Active Problems:   Cancer of left female breast  (HCC)  Blood pressure (!) 131/92, pulse 80, temperature 97.6 F (36.4 C), temperature source Oral, resp. rate 19, height 5\' 1"  (1.549 m), weight 86.2 kg, SpO2 94 %.  Subjective: Symptoms:  Stable.   Diet:  Adequate intake.   Activity level: Normal.   Pain:  She reports no pain.    Objective: General Appearance:  Comfortable.   Vital signs: (most recent): Blood pressure (!) 131/92, pulse 80, temperature 97.6 F (36.4 C), temperature source Oral, resp. rate 19, height 5\' 1"  (1.549 m), weight 86.2 kg, SpO2 94 %.  Vital signs are normal.   Lungs:  Normal effort.  Breath sounds clear to auscultation.   Heart: Normal rate.   Chest: (Skin flaps look good) Abdomen: Abdomen is soft.  There is no abdominal tenderness.     Assessment:  Condition: In stable condition.   (Teach pt drain care).   Plan:  Discharge home.     Autumn Messing III 11/13/2019

## 2019-11-13 NOTE — Anesthesia Postprocedure Evaluation (Signed)
Anesthesia Post Note  Patient: [name]  Procedure(s) Performed: LEFT MASTECTOMY WITH SENTINEL LYMPH NODE BIOPSY AND TARGETED NODE DISSECTION (Left Breast)     Patient location during evaluation: PACU Anesthesia Type: General Level of consciousness: sedated and patient cooperative Pain management: pain level controlled Vital Signs Assessment: post-procedure vital signs reviewed and stable Respiratory status: spontaneous breathing Cardiovascular status: stable Anesthetic complications: no    Last Vitals:  Vitals:   11/13/19 0216 11/13/19 0522  BP: 122/78 (!) 131/92  Pulse: 85 80  Resp: 17 19  Temp: 36.7 C 36.4 C  SpO2: 93% 94%    Last Pain:  Vitals:   11/13/19 0522  TempSrc: Oral  PainSc:                  Nolon Nations

## 2019-11-13 NOTE — Discharge Summary (Signed)
Physician Discharge Summary  Patient ID: [name] MRN: [id number] DOB/AGE: 1956/01/08 63 y.o.  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 She tolerated surgery well. On pod 1 she was ready for d/c home  Consults: None  Significant Diagnostic Studies: none  Treatments: surgery: as above  Discharge Exam: Blood pressure (!) 131/92, pulse 80, temperature 97.6 F (36.4 C), temperature source Oral, resp. rate 19, height _0  (1.549 m), weight 86.2 kg, SpO2 94 %. Chest wall: skin flaps look good  Disposition: Discharge disposition: 01-Home or Self Care       Discharge Instructions    Call MD for:  difficulty breathing, headache or visual disturbances   Complete by: As directed    Call MD for:  extreme fatigue   Complete by: As directed    Call MD for:  hives   Complete by: As directed    Call MD for:  persistant dizziness or light-headedness   Complete by: As directed    Call MD for:  persistant nausea and vomiting   Complete by: As directed    Call MD for:  redness, tenderness, or signs of infection (pain, swelling, redness, odor or green/yellow discharge around incision site)   Complete by: As directed    Call MD for:  severe uncontrolled pain   Complete by: As directed    Call MD for:  temperature >100.4   Complete by: As directed    Diet - low sodium heart healthy   Complete by: As directed    Discharge instructions   Complete by: As directed    Sponge bathe while drains are in. No overhead activity with left arm. Wear binder most of the time until follow up   Increase activity slowly   Complete by: As directed    No wound care   Complete by: As directed      Allergies as of 11/13/2019      Reactions   Hydralazine Swelling   Eye Swelling   Other Other (See Comments)   Unknown blood pressure medication caused face swelling      Medication List    TAKE these medications   acetaminophen 500 MG tablet Commonly known as: TYLENOL Take 1,000 mg by mouth every 6 (six) hours as needed for mild pain, moderate pain or headache. For pain   allopurinol 100 MG tablet Commonly known as: ZYLOPRIM Take 200 mg by mouth daily.   amLODipine 5 MG tablet Commonly known as: NORVASC Take 1 tablet (5 mg total) by mouth daily.   aspirin 81 MG EC tablet Take 1 tablet (81 mg total) by mouth daily.   atorvastatin 40 MG tablet Commonly known as: LIPITOR Take 1 tablet (40 mg total) by mouth daily at 6 PM.   B-D ULTRAFINE III SHORT PEN 31G X 8 MM Misc Generic drug: Insulin Pen Needle USE AS DIRECTED *EMERGENCY FILL*   blood glucose meter kit and supplies Kit Dispense based on patient and insurance preference. Use up to four times daily as directed. (FOR ICD-9 250.00, 250.01).   calcitRIOL 0.25 MCG capsule Commonly known as: ROCALTROL Take 0.25 mcg by mouth daily.   cloNIDine 0.1 MG tablet Commonly known as: CATAPRES Take 1 tablet (0.1 mg total) by mouth 2 (two) times daily.   Colchicine 0.6 MG Caps Take 0.6 mg by mouth every  other day.   furosemide 40 MG tablet Commonly known as: LASIX Take 40 mg by mouth 2 (two) times daily.   gabapentin 100 MG capsule Commonly known as: NEURONTIN Take 100 mg by mouth 3 (three) times daily.   HYDROcodone-acetaminophen 5-325 MG tablet Commonly known as: NORCO/VICODIN Take 1-2 tablets by mouth every 6 (six) hours as needed for moderate pain.   ICY HOT EX Apply 1 application topically daily as needed (pain).   insulin aspart 100 UNIT/ML FlexPen Commonly known as: NovoLOG FlexPen Inject 5 Units into the skin 3 (three) times daily with meals. Additional insulin (5 units PLUS) CBG 70 - 120: 0 units CBG 121 - 150: 1 unit CBG 151 - 200: 2 units CBG 201 - 250: 3 units CBG 251 - 350: 4 units CBG 351 - 400: 6 units What  changed:   how much to take  additional instructions   insulin glargine 100 UNIT/ML injection Commonly known as: LANTUS Inject 20 Units into the skin at bedtime. Inject 20 units into skin at 10pm   isosorbide mononitrate 120 MG 24 hr tablet Commonly known as: IMDUR TAKE 1/2 TABLET DAILY = 60 MG DAILY What changed:   how much to take  how to take this  when to take this  additional instructions   metoprolol tartrate 25 MG tablet Commonly known as: LOPRESSOR Take 1 tablet (25 mg total) by mouth 2 (two) times daily.   ONE TOUCH ULTRA TEST test strip Generic drug: glucose blood 1 each by Other route 4 (four) times daily.   OneTouch Delica Lancets 25Q Misc Inject 33 g into the skin 4 (four) times daily.   polyethylene glycol 17 g packet Commonly known as: MIRALAX / GLYCOLAX Take 17 g by mouth daily as needed for moderate constipation.   telmisartan 20 MG tablet Commonly known as: MICARDIS Take 1 tablet (20 mg total) by mouth daily.   trolamine salicylate 10 % cream Commonly known as: ASPERCREME Apply 1 application topically as needed for muscle pain.      Follow-up Information    Autumn Messing III, MD Follow up in 2 week(s).   Specialty: General Surgery Contact information: [place] [address] [phone]           Signed: Autumn Messing III 11/13/2019, 8:05 AM

## 2019-11-14 ENCOUNTER — Other Ambulatory Visit: Payer: Self-pay | Admitting: Physician Assistant

## 2019-11-14 ENCOUNTER — Other Ambulatory Visit: Payer: Self-pay

## 2019-11-14 MED ORDER — CLONIDINE HCL 0.1 MG PO TABS: 0.1000 mg | ORAL_TABLET | Freq: Two times a day (BID) | ORAL | 3 refills | Status: DC

## 2019-11-16 ENCOUNTER — Other Ambulatory Visit: Payer: Self-pay | Admitting: Physician Assistant

## 2019-11-16 LAB — SURGICAL PATHOLOGY

## 2019-11-26 ENCOUNTER — Inpatient Hospital Stay: Payer: PPO | Attending: Oncology | Admitting: Oncology

## 2019-11-26 ENCOUNTER — Encounter: Payer: Self-pay | Admitting: Oncology

## 2019-11-26 ENCOUNTER — Other Ambulatory Visit: Payer: Self-pay

## 2019-11-26 DIAGNOSIS — C50812 Malignant neoplasm of overlapping sites of left female breast: Secondary | ICD-10-CM

## 2019-11-26 DIAGNOSIS — Z7189 Other specified counseling: Secondary | ICD-10-CM

## 2019-11-26 DIAGNOSIS — Z170 Estrogen receptor positive status [ER+]: Secondary | ICD-10-CM | POA: Diagnosis not present

## 2019-11-26 NOTE — Progress Notes (Signed)
Patient stated that she had her surgery done on 11/12/2019 by Dr. Marlou Starks. Patient stated that she has a post-op appointment on 12/04/2019 with Dr. Marlou Starks. At this time patient stated that she has some pain and discomfort from her left breast due to her having to keep drains until 12/04/2019.

## 2019-11-27 MED ORDER — ONDANSETRON HCL 8 MG PO TABS: 8.0000 mg | ORAL_TABLET | Freq: Two times a day (BID) | ORAL | 1 refills | Status: DC | PRN

## 2019-11-27 MED ORDER — DEXAMETHASONE 4 MG PO TABS: 8.0000 mg | ORAL_TABLET | Freq: Two times a day (BID) | ORAL | 1 refills | Status: DC

## 2019-11-27 MED ORDER — LORAZEPAM 0.5 MG PO TABS: 0.5000 mg | ORAL_TABLET | Freq: Four times a day (QID) | ORAL | 0 refills | Status: DC | PRN

## 2019-11-27 MED ORDER — PROCHLORPERAZINE MALEATE 10 MG PO TABS: 10.0000 mg | ORAL_TABLET | Freq: Four times a day (QID) | ORAL | 1 refills | Status: DC | PRN

## 2019-11-27 MED ORDER — LIDOCAINE-PRILOCAINE 2.5-2.5 % EX CREA: TOPICAL_CREAM | CUTANEOUS | 3 refills | Status: DC

## 2019-11-27 NOTE — Progress Notes (Signed)
START ON PATHWAY REGIMEN - Breast     A cycle is every 21 days:     Docetaxel      Cyclophosphamide   **Always confirm dose/schedule in your pharmacy ordering system**  Patient Characteristics: Postoperative without Neoadjuvant Therapy (Pathologic Staging), Invasive Disease, Adjuvant Therapy, HER2 Negative/Unknown/Equivocal, ER Positive, Node Positive, Node Positive (4+) Therapeutic Status: Postoperative without Neoadjuvant Therapy (Pathologic Staging) AJCC Grade: G2 AJCC N Category: pN2a AJCC M Category: cM0 ER Status: Positive (+) AJCC 8 Stage Grouping: IB HER2 Status: Negative (-) Oncotype Dx Recurrence Score: Not Appropriate AJCC T Category: pT2 PR Status: Positive (+) Intent of Therapy: Curative Intent, Discussed with Patient

## 2019-11-27 NOTE — Progress Notes (Signed)
I connected with [name] on 11/27/19 at 10:30 AM EST by video enabled telemedicine visit and verified that I am speaking with the correct person using two identifiers.   I discussed the limitations, risks, security and privacy concerns of performing an evaluation and management service by telemedicine and the availability of in-person appointments. I also discussed with the patient that there may be a patient responsible charge related to this service. The patient expressed understanding and agreed to proceed.  Other persons participating in the visit and their role in the encounter:  none  Patient's location:  home Provider's location:  work  Risk analyst Complaint: Discuss final pathology results and further management  Diagnosis: Invasive lobular carcinoma of the right breast pathological prognostic stage Ib pT2 pN2 acM0 ER/PR positive HER-2/neu negative s/p mastectomy and targeted node dissection  History of present illness: Patient is a 63 year old female with a past medical history significant for hypertension, CKD, diabetes, nonischemic cardiomyopathy among other medical problems. She has not undergone a mammogram for several years. Mammogram on October 2020 was done after she had a palpable area of concern in the left breast which showed an irregular mass with increased vascularity measuring 3.1 x 2.4 x 4.5 cm. The nodularity extended towards the nipple measuring at least 5.4 cm. Single abnormal lymph node in the left axilla with cortical thickness of 0.7 cm. 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   PET scan showed hypermetabolic left axillary lymph nodes which was reviewed at tumor board and they were at least found to be 3-4.  Retroareolar left breast mass 2.5 x 3.2 cm in size.  No evidence of distant metastatic disease.  There were  small to borderline enlarged retroperitoneal lymph nodes 10 mm in size with an SUV of 4.3.  Retroperitoneal lymph nodes will require follow-up in the future  MRI showed 2 sites of biopsy-proven malignancy in the lateral left breast at posterior and anterior depth.  There is non-mass enhancement spanning between the 2 sites.  Morphologically abnormal level 1 axillary lymph node consistent with biopsy-proven metastatic disease.  Final pathology showed 2 separate tumors one which was 4.5 cm and the other one was 4.4 cm in size.  Grade 2 ER/PR positive HER-2/neu negative with negative margins. 5 out of 11 lymph nodes were positive for malignancy.  Extranodal extension present.  Largest size of metastatic deposit 14 mm.  mpT2 mpN2  Interval history: she is recovering well from her mastectomy. She has an appointment with Dr. Ervin Knack next week. She does not have a port in place yet   Review of Systems  Constitutional: Positive for malaise/fatigue. Negative for chills, fever and weight loss.  HENT: Negative for congestion, ear discharge and nosebleeds.   Eyes: Negative for blurred vision.  Respiratory: Negative for cough, hemoptysis, sputum production, shortness of breath and wheezing.   Cardiovascular: Negative for chest pain, palpitations, orthopnea and claudication.  Gastrointestinal: Negative for abdominal pain, blood in stool, constipation, diarrhea, heartburn, melena, nausea and vomiting.  Genitourinary: Negative for dysuria, flank pain, frequency, hematuria and urgency.  Musculoskeletal: Negative for back pain, joint pain and myalgias.  Skin: Negative for rash.  Neurological: Negative for dizziness, tingling, focal weakness, seizures, weakness and headaches.  Endo/Heme/Allergies: Does not bruise/bleed easily.  Psychiatric/Behavioral: Negative for depression and suicidal ideas. The patient does not have insomnia.  Allergies  Allergen Reactions   Hydralazine Swelling    Eye Swelling    Other Other (See Comments)    Unknown blood pressure medication caused face swelling    Past Medical History:  Diagnosis Date   Arthritis of both feet    Gout   Cancer ([place])    Breast Cancer on left   Chronic combined systolic and diastolic CHF (congestive heart failure) ([place])    a. Echo (06/2014): Severe LVH, EF 20-25%, no RWMA, Gr 3 DD, trivial MR, mild LAE, mild RVE, PASP 38 mmHg .>> b. Echo 2/16 EF 50-55% //  // c. Echo 02/2019: EF 50-55, mod LVH, Gr 1 DD, mild LAE    CKD (chronic kidney disease), stage III    now stage 4   Diabetes mellitus type 2 in obese [facility])    Diverticular disease    Dyspnea    Family history of breast cancer    Family history of leukemia    Family history of lung cancer    Family history of testicular cancer    Family history of throat cancer    Fatty liver    History of GI diverticular bleed    Hyperlipidemia    Hyperphosphatemia    Hypertension    Hypertensive heart disease    a. Renal Artery Duplex (8/15):  no RAS   Iron deficiency anemia    NICM (nonischemic cardiomyopathy) ([place])    a. Nuclear (06/2014): EF 32%, apical thinning, no definite scar or ischemia;  b. Echo (2/16):  Mild LVH, EF 50-55%, Gr 2 DD, no RWMA   Obesity    PONV (postoperative nausea and vomiting)    after D and C   Proteinuria    PUD (peptic ulcer disease)    Secondary hyperparathyroidism, renal ([facility])    Tobacco abuse    UTI (lower urinary tract infection)    Vaginal dryness, menopausal    Vaginal yeast infection     Past Surgical History:  Procedure Laterality Date   COLONOSCOPY     DILATION AND CURETTAGE OF UTERUS     MASTECTOMY W/ SENTINEL NODE BIOPSY Left 11/12/2019   MASTECTOMY W/ SENTINEL NODE BIOPSY Left 11/12/2019   Procedure: LEFT MASTECTOMY WITH SENTINEL LYMPH NODE BIOPSY AND TARGETED NODE DISSECTION;  Surgeon: Jovita Kussmaul, MD;  Location: [facility] OR;  Service: General;  Laterality: Left;   None      Social History    Socioeconomic History   Marital status: Married    Spouse name: Not on file   Number of children: 2   Years of education: 7   Highest education level: Not on file  Occupational History   Not on file  Social Needs   Financial resource strain: Not on file   Food insecurity    Worry: Not on file    Inability: Not on file   Transportation needs    Medical: Not on file    Non-medical: Not on file  Tobacco Use   Smoking status: Former Smoker    Quit date: 07/05/2014    Years since quitting: 5.4   Smokeless tobacco: Never Used  Substance and Sexual Activity   Alcohol use: No   Drug use: No   Sexual activity: Not on file  Lifestyle   Physical activity    Days per week: Not on file    Minutes per session: Not on file   Stress: Not on file  Relationships   Social connections    Talks on phone:  Not on file    Gets together: Not on file    Attends religious service: Not on file    Active member of club or organization: Not on file    Attends meetings of clubs or organizations: Not on file    Relationship status: Not on file   Intimate partner violence    Fear of current or ex partner: Not on file    Emotionally abused: Not on file    Physically abused: Not on file    Forced sexual activity: Not on file  Other Topics Concern   Not on file  Social History Narrative   Currently lives in a house with her husband.    Fun: Watch TV.   Denies religious beliefs effecting health care.     Family History  Problem Relation Age of Onset   Heart attack Paternal Grandfather    Stroke Paternal Grandmother    Hypertension Paternal Grandmother    Kidney disease Mother    Diabetes Mother    Testicular cancer Father        diagnosed older than 107   Coronary artery disease Other    Hypertension Other    Breast cancer Sister        diagnosed late 76s, negative genetics   Leukemia Brother        chronic   Throat cancer Brother    Pneumonia Sister 2    Lung cancer Paternal Uncle        diagnosed over 60     Current Outpatient Medications:    acetaminophen (TYLENOL) 500 MG tablet, Take 1,000 mg by mouth every 6 (six) hours as needed for mild pain, moderate pain or headache. For pain, Disp: , Rfl:    allopurinol (ZYLOPRIM) 100 MG tablet, Take 200 mg by mouth daily., Disp: , Rfl:    amLODipine (NORVASC) 5 MG tablet, Take 1 tablet (5 mg total) by mouth daily., Disp: 90 tablet, Rfl: 3   aspirin EC 81 MG EC tablet, Take 1 tablet (81 mg total) by mouth daily., Disp: , Rfl:    atorvastatin (LIPITOR) 40 MG tablet, Take 1 tablet (40 mg total) by mouth daily at 6 PM., Disp: 90 tablet, Rfl: 3   B-D ULTRAFINE III SHORT PEN 31G X 8 MM MISC, USE AS DIRECTED *EMERGENCY FILL*, Disp: , Rfl: 0   blood glucose meter kit and supplies KIT, Dispense based on patient and insurance preference. Use up to four times daily as directed. (FOR ICD-9 250.00, 250.01)., Disp: 1 each, Rfl: 0   calcitRIOL (ROCALTROL) 0.25 MCG capsule, Take 0.25 mcg by mouth daily., Disp: , Rfl:    cloNIDine (CATAPRES) 0.1 MG tablet, Take 1 tablet (0.1 mg total) by mouth 2 (two) times daily., Disp: 180 tablet, Rfl: 3   Colchicine 0.6 MG CAPS, Take 0.6 mg by mouth every other day. , Disp: , Rfl:    furosemide (LASIX) 40 MG tablet, Take 40 mg by mouth 2 (two) times daily., Disp: , Rfl:    gabapentin (NEURONTIN) 100 MG capsule, Take 100 mg by mouth 3 (three) times daily., Disp: , Rfl:    HYDROcodone-acetaminophen (NORCO/VICODIN) 5-325 MG tablet, Take 1-2 tablets by mouth every 6 (six) hours as needed for moderate pain., Disp: 10 tablet, Rfl: 0   insulin aspart (NOVOLOG FLEXPEN) 100 UNIT/ML FlexPen, Inject 5 Units into the skin 3 (three) times daily with meals. Additional insulin (5 units PLUS) CBG 70 - 120: 0 units CBG 121 - 150: 1 unit CBG 151 -  200: 2 units CBG 201 - 250: 3 units CBG 251 - 350: 4 units CBG 351 - 400: 6 units (Patient taking differently: Inject 5-10 Units into the  skin 3 (three) times daily with meals. ), Disp: 15 mL, Rfl: 11   insulin glargine (LANTUS) 100 UNIT/ML injection, Inject 20 Units into the skin at bedtime. Inject 20 units into skin at 10pm, Disp: , Rfl:    isosorbide mononitrate (IMDUR) 120 MG 24 hr tablet, TAKE 1/2 TABLET DAILY = 60 MG DAILY (Patient taking differently: Take 60 mg by mouth daily. ), Disp: 45 tablet, Rfl: 3   metoprolol tartrate (LOPRESSOR) 25 MG tablet, Take 1 tablet (25 mg total) by mouth 2 (two) times daily., Disp: 180 tablet, Rfl: 3   ONE TOUCH ULTRA TEST test strip, 1 each by Other route 4 (four) times daily. , Disp: , Rfl: 1   ONETOUCH DELICA LANCETS 40N MISC, Inject 33 g into the skin 4 (four) times daily. , Disp: , Rfl: 1   polyethylene glycol (MIRALAX / GLYCOLAX) 17 g packet, Take 17 g by mouth daily as needed for moderate constipation., Disp: , Rfl:    telmisartan (MICARDIS) 20 MG tablet, Take 1 tablet (20 mg total) by mouth daily., Disp: 90 tablet, Rfl: 3   trolamine salicylate (ASPERCREME) 10 % cream, Apply 1 application topically as needed for muscle pain., Disp: , Rfl:    dexamethasone (DECADRON) 4 MG tablet, Take 2 tablets (8 mg total) by mouth 2 (two) times daily. Start the day before Taxotere. Then again the day after chemo for 3 days., Disp: 30 tablet, Rfl: 1   lidocaine-prilocaine (EMLA) cream, Apply to affected area once, Disp: 30 g, Rfl: 3   LORazepam (ATIVAN) 0.5 MG tablet, Take 1 tablet (0.5 mg total) by mouth every 6 (six) hours as needed (Nausea or vomiting)., Disp: 30 tablet, Rfl: 0   Menthol, Topical Analgesic, (ICY HOT EX), Apply 1 application topically daily as needed (pain)., Disp: , Rfl:    ondansetron (ZOFRAN) 8 MG tablet, Take 1 tablet (8 mg total) by mouth 2 (two) times daily as needed for refractory nausea / vomiting. Start on day 3 after chemo., Disp: 30 tablet, Rfl: 1   prochlorperazine (COMPAZINE) 10 MG tablet, Take 1 tablet (10 mg total) by mouth every 6 (six) hours as needed  (Nausea or vomiting)., Disp: 30 tablet, Rfl: 1  Nm Sentinel Node Inj-no Rpt (breast)  Result Date: 11/12/2019 Sulfur colloid was injected by the nuclear medicine technologist for melanoma sentinel node.   Mm Breast Surgical Specimen  Result Date: 11/12/2019 CLINICAL DATA:  Biopsy proven left axillary metastasis. Status post surgical excision. EXAM: SPECIMEN RADIOGRAPH OF THE LEFT AXILLA COMPARISON:  Previous exam(s). FINDINGS: Status post excision of the left axilla. The radioactive seed and biopsy marker clip are present, completely intact, and were marked for pathology. IMPRESSION: Specimen radiograph of the left axilla. Electronically Signed   By: Lillia Mountain M.D.   On: 11/12/2019 12:02   [place] Radioactive Seed Loc  Result Date: 11/12/2019 CLINICAL DATA:  Biopsy proven metastatic left axillary lymph node. EXAM: ULTRASOUND GUIDED RADIOACTIVE SEED LOCALIZATION OF THE LEFT BREAST COMPARISON:  Previous exam(s). FINDINGS: Patient presents for radioactive seed localization prior to surgery. I met with the patient and we discussed the procedure of seed localization including benefits and alternatives. We discussed the high likelihood of a successful procedure. We discussed the risks of the procedure including infection, bleeding, tissue injury and further surgery. We discussed the low  dose of radioactivity involved in the procedure. Informed, written consent was given. The usual time-out protocol was performed immediately prior to the procedure. Using ultrasound guidance, sterile technique, 1% lidocaine and an I-125 radioactive seed, the HydroMARK clip in the axilla was localized using a lateral to medial approach. The follow-up mammogram images confirm the seed in the expected location and were marked for Dr. Marlou Starks. Follow-up survey of the patient confirms presence of the radioactive seed. Order number of I-125 seed:  [id number]. Total activity:  6.503 millicuries reference Date: 10/18/2019 The patient  tolerated the procedure well and was released from the [facility]. She was given instructions regarding seed removal. IMPRESSION: Radioactive seed localization the left axilla. No apparent complications. Electronically Signed   By: Lillia Mountain M.D.   On: 11/12/2019 08:41   Mm Clip Placement Left  Result Date: 11/12/2019 CLINICAL DATA:  Status post placement of a radioactive seed in the left axilla. EXAM: DIAGNOSTIC LEFT MAMMOGRAM POST ULTRASOUND-GUIDED RADIOACTIVE SEED PLACEMENT COMPARISON:  Previous exam(s). FINDINGS: Mammographic images were obtained following ultrasound-guided radioactive seed placement. These demonstrate the radioactive seed is in the left axilla in appropriate position near a HydroMARK clip from prior ultrasound-guided core biopsy. IMPRESSION: Appropriate location of the radioactive seed. Final Assessment: Post Procedure Mammograms for Seed Placement Electronically Signed   By: Lillia Mountain M.D.   On: 11/12/2019 08:34        CMP Latest Ref Rng & Units 11/12/2019  Glucose 70 - 99 mg/dL 147(H)  BUN 8 - 23 mg/dL 39(H)  Creatinine 0.44 - 1.00 mg/dL 1.77(H)  Sodium 135 - 145 mmol/L 139  Potassium 3.5 - 5.1 mmol/L 4.2  Chloride 98 - 111 mmol/L 101  CO2 22 - 32 mmol/L 26  Calcium 8.9 - 10.3 mg/dL 9.3  Total Protein 6.5 - 8.1 g/dL 7.8  Total Bilirubin 0.3 - 1.2 mg/dL 0.9  Alkaline Phos 38 - 126 U/L 109  AST 15 - 41 U/L 19  ALT 0 - 44 U/L 20   CBC Latest Ref Rng & Units 11/12/2019  WBC 4.0 - 10.5 K/uL 7.4  Hemoglobin 12.0 - 15.0 g/dL 13.5  Hematocrit 36.0 - 46.0 % 42.9  Platelets 150 - 400 K/uL 254     Observation/objective: Appears in no acute distress on video visit today.  Breathing is nonlabored  Assessment and plan: Patient is a 63 year old female with pathological prognostic stage Ib invasive lobular carcinoma pT2 pN2 acM0 ER/PR positive HER-2/neu negative s/p right mastectomy and targeted node dissection  I discussed the results of final pathology with the  patient in detail.  She had 2 separate primary breast tumors each of which were 4.4 and 4.5 cm in size.  5 out of 11 lymph nodes were positive for malignancy.  Extranodal extension was present.  Due to these features and more than 3 lymph nodes being positive she will benefit from adjuvant chemotherapy.  Patient has prior history of heart failure and has not been to be a candidate for anthracycline per cardiology.  I will therefore choose Taxotere and Cytoxan every 3 weeks for 4 cycles with growth factor support as adjuvant chemotherapy regimen for her.  Discussed risks and benefits of chemotherapy including all but not limited to nausea, vomiting, low blood counts, risk of infections and hospitalization.  Risk of peripheral neuropathy and infusion reaction associated with Taxotere.Patient understands and agrees to proceed as planned.  Treatment will be given with a curative intent.  Patient will need port placement and chemotherapy teach.  I will tentatively see her on 12/17/2019 to start first cycle of TC chemotherapy.  She does have baseline chronic kidney disease and adjustments will be made to chemotherapy if need be based on her renal functions  Upon completion of chemotherapy patient will be a candidate for post mastectomy radiation.  There will also be role for her to take hormone therapy with Arimidex for 10 years upon completion of radiation treatment which I will discuss in greater detail after completion of chemotherapy.  Cancer Staging Malignant neoplasm of overlapping sites of left breast in female, estrogen receptor positive ([place]) Staging form: Breast, AJCC 8th Edition - Clinical stage from 10/19/2019: Stage IIA (cT2, cN1, cM0, G2, ER+, PR+, HER2-) - Signed by [name], MD on 10/22/2019 - Pathologic stage from 11/26/2019: Stage IB (pT2, pN2a, cM0, G2, ER+, PR+, HER2-) - Signed by [name], MD on 11/27/2019    Follow-up instructions: As above  I discussed the assessment and  treatment plan with the patient. The patient was provided an opportunity to ask questions and all were answered. The patient agreed with the plan and demonstrated an understanding of the instructions.   The patient was advised to call back or seek an in-person evaluation if the symptoms worsen or if the condition fails to improve as anticipated.   Visit Diagnosis: 1. Malignant neoplasm of overlapping sites of left breast in female, estrogen receptor positive ([place])     Dr. Randa Evens, MD, MPH [facility] at [facility] [phone] 11/27/2019 1:30 PM

## 2019-11-28 ENCOUNTER — Other Ambulatory Visit: Payer: Self-pay | Admitting: *Deleted

## 2019-11-28 DIAGNOSIS — C50812 Malignant neoplasm of overlapping sites of left female breast: Secondary | ICD-10-CM

## 2019-11-28 DIAGNOSIS — Z170 Estrogen receptor positive status [ER+]: Secondary | ICD-10-CM

## 2019-11-29 ENCOUNTER — Telehealth (INDEPENDENT_AMBULATORY_CARE_PROVIDER_SITE_OTHER): Payer: Self-pay

## 2019-11-29 NOTE — Telephone Encounter (Signed)
Spoke with the patient and she is now scheduled for port placement with Dr. Delana Meyer on 12/11/2019 with a 10:00 am arrival time to the [facility]. Patient will do covid testing on 12/07/2019 between 12:30-2:30 pm at the [facility]. Pre-procedure instructions were discussed and will be mailed to the patient.

## 2019-12-04 ENCOUNTER — Telehealth: Payer: Self-pay | Admitting: *Deleted

## 2019-12-04 NOTE — Telephone Encounter (Signed)
Called pt to go over appts for her education and start chemo. Left her a message so that she cna call me back and discuss appt.

## 2019-12-04 NOTE — Telephone Encounter (Signed)
I called the pt. Today to tell her about the appts and she was not at home. She called me back. Her husband passed away suddenly on April 14, 2023 of last week. I told her I would call back. She states the she will do the covid test to get port placed but she wants to have chemo education and chemo date moved out 2 weeks from when it is now scheduled. That would make her starting chemo in mid Jan. I checked with rao and she said yes. I sent message to heather to change appts and the pt. Has my chart and she will look for the new dates

## 2019-12-06 ENCOUNTER — Other Ambulatory Visit: Payer: PPO

## 2019-12-07 ENCOUNTER — Other Ambulatory Visit
Admission: RE | Admit: 2019-12-07 | Discharge: 2019-12-07 | Disposition: A | Discharge status: Home / Self Care | Payer: PPO | Source: Ambulatory Visit | Attending: Vascular Surgery | Admitting: Vascular Surgery

## 2019-12-07 DIAGNOSIS — Z01812 Encounter for preprocedural laboratory examination: Secondary | ICD-10-CM | POA: Insufficient documentation

## 2019-12-07 DIAGNOSIS — Z20828 Contact with and (suspected) exposure to other viral communicable diseases: Secondary | ICD-10-CM | POA: Diagnosis not present

## 2019-12-08 LAB — SARS CORONAVIRUS 2 (TAT 6-24 HRS): SARS Coronavirus 2: NEGATIVE

## 2019-12-10 ENCOUNTER — Telehealth (INDEPENDENT_AMBULATORY_CARE_PROVIDER_SITE_OTHER): Payer: Self-pay

## 2019-12-10 ENCOUNTER — Other Ambulatory Visit (INDEPENDENT_AMBULATORY_CARE_PROVIDER_SITE_OTHER): Payer: Self-pay | Admitting: Nurse Practitioner

## 2019-12-10 ENCOUNTER — Other Ambulatory Visit: Payer: PPO

## 2019-12-10 NOTE — Telephone Encounter (Signed)
Patient called wanting to know about her insulin, I explained that she should take half her insulin dose. Patient stated she takes a blood thinner, when asked she stated Aspirin I told her to continue taking the Aspirin because we don't stop that medication. Patient wanted to know what to do if she is low the morning of her procedure. Per Eulogio Ditch NP, the patient can take her meds with small sips of apple juice and if she her blood sugar is low to not take the insulin.

## 2019-12-11 ENCOUNTER — Encounter: Payer: Self-pay | Admitting: Registered Nurse

## 2019-12-11 ENCOUNTER — Other Ambulatory Visit: Payer: Self-pay

## 2019-12-11 ENCOUNTER — Encounter
Admission: RE | Disposition: A | Discharge status: Home / Self Care | Payer: Self-pay | Source: Home / Self Care | Attending: Vascular Surgery

## 2019-12-11 ENCOUNTER — Encounter: Payer: Self-pay | Admitting: Vascular Surgery

## 2019-12-11 ENCOUNTER — Ambulatory Visit
Admission: RE | Admit: 2019-12-11 | Discharge: 2019-12-11 | Disposition: A | Discharge status: Home / Self Care | Payer: PPO | Attending: Vascular Surgery | Admitting: Vascular Surgery

## 2019-12-11 DIAGNOSIS — C50111 Malignant neoplasm of central portion of right female breast: Secondary | ICD-10-CM

## 2019-12-11 DIAGNOSIS — C50112 Malignant neoplasm of central portion of left female breast: Secondary | ICD-10-CM | POA: Diagnosis not present

## 2019-12-11 DIAGNOSIS — D4860 Neoplasm of uncertain behavior of unspecified breast: Secondary | ICD-10-CM | POA: Diagnosis not present

## 2019-12-11 HISTORY — PX: PORTA CATH INSERTION: CATH118285

## 2019-12-11 LAB — GLUCOSE, CAPILLARY
Glucose-Capillary: 139 mg/dL — ABNORMAL HIGH (ref 70–99)
Glucose-Capillary: 151 mg/dL — ABNORMAL HIGH (ref 70–99)

## 2019-12-11 SURGERY — PORTA CATH INSERTION
Anesthesia: Moderate Sedation

## 2019-12-11 MED ORDER — MIDAZOLAM HCL 2 MG/2ML IJ SOLN
INTRAMUSCULAR | Status: DC | PRN
  Administered 2019-12-11: 2 mg via INTRAVENOUS
  Administered 2019-12-11: 1 mg via INTRAVENOUS

## 2019-12-11 MED ORDER — FENTANYL CITRATE (PF) 100 MCG/2ML IJ SOLN
INTRAMUSCULAR | Status: DC | PRN
  Administered 2019-12-11 (×2): 50 ug via INTRAVENOUS

## 2019-12-11 MED ORDER — SODIUM CHLORIDE 0.9 % IV SOLN: INTRAVENOUS | Status: DC

## 2019-12-11 MED ORDER — MIDAZOLAM HCL 5 MG/5ML IJ SOLN
INTRAMUSCULAR | Status: AC
  Filled 2019-12-11: qty 5

## 2019-12-11 MED ORDER — MIDAZOLAM HCL 2 MG/ML PO SYRP: 8.0000 mg | ORAL_SOLUTION | Freq: Once | ORAL | Status: DC | PRN

## 2019-12-11 MED ORDER — HYDROMORPHONE HCL 1 MG/ML IJ SOLN: 1.0000 mg | Freq: Once | INTRAMUSCULAR | Status: DC | PRN

## 2019-12-11 MED ORDER — ONDANSETRON HCL 4 MG/2ML IJ SOLN
4.0000 mg | Freq: Four times a day (QID) | INTRAMUSCULAR | Status: DC | PRN
  Administered 2019-12-11: 4 mg via INTRAVENOUS

## 2019-12-11 MED ORDER — ONDANSETRON HCL 4 MG/2ML IJ SOLN
INTRAMUSCULAR | Status: AC
  Filled 2019-12-11: qty 2

## 2019-12-11 MED ORDER — CEFAZOLIN SODIUM-DEXTROSE 2-4 GM/100ML-% IV SOLN: 2.0000 g | Freq: Once | INTRAVENOUS | Status: AC

## 2019-12-11 MED ORDER — CEFAZOLIN SODIUM-DEXTROSE 2-4 GM/100ML-% IV SOLN
INTRAVENOUS | Status: AC
  Administered 2019-12-11: 2 g via INTRAVENOUS
  Filled 2019-12-11: qty 100

## 2019-12-11 MED ORDER — METHYLPREDNISOLONE SODIUM SUCC 125 MG IJ SOLR: 125.0000 mg | Freq: Once | INTRAMUSCULAR | Status: DC | PRN

## 2019-12-11 MED ORDER — DIPHENHYDRAMINE HCL 50 MG/ML IJ SOLN: 50.0000 mg | Freq: Once | INTRAMUSCULAR | Status: DC | PRN

## 2019-12-11 MED ORDER — FENTANYL CITRATE (PF) 100 MCG/2ML IJ SOLN
INTRAMUSCULAR | Status: AC
  Filled 2019-12-11: qty 2

## 2019-12-11 MED ORDER — FAMOTIDINE 20 MG PO TABS: 40.0000 mg | ORAL_TABLET | Freq: Once | ORAL | Status: DC | PRN

## 2019-12-11 SURGICAL SUPPLY — 11 items
DRAPE INCISE IOBAN 66X45 STRL (DRAPES) ×2 IMPLANT
KIT PORT POWER 8FR ISP CVUE (Port) ×1 IMPLANT
NDL ENTRY 21GA 7CM ECHOTIP (NEEDLE) IMPLANT
NEEDLE ENTRY 21GA 7CM ECHOTIP (NEEDLE) ×2 IMPLANT
PACK ANGIOGRAPHY (CUSTOM PROCEDURE TRAY) ×2 IMPLANT
SET INTRO CAPELLA COAXIAL (SET/KITS/TRAYS/PACK) ×1 IMPLANT
SPONGE XRAY 4X4 16PLY STRL (MISCELLANEOUS) ×1 IMPLANT
SUT MNCRL AB 4-0 PS2 18 (SUTURE) ×2 IMPLANT
SUT PROLENE 0 CT 1 30 (SUTURE) ×2 IMPLANT
SUT VIC AB 3-0 CT1 27 (SUTURE) ×2
SUT VIC AB 3-0 CT1 TAPERPNT 27 (SUTURE) IMPLANT

## 2019-12-11 NOTE — Discharge Instructions (Signed)
Implanted Port Insertion, Care After °This sheet gives you information about how to care for yourself after your procedure. Your health care provider may also give you more specific instructions. If you have problems or questions, contact your health care provider. °What can I expect after the procedure? °After the procedure, it is common to have: °· Discomfort at the port insertion site. °· Bruising on the skin over the port. This should improve over 3-4 days. °Follow these instructions at home: °Port care °· After your port is placed, you will get a manufacturer's information card. The card has information about your port. Keep this card with you at all times. °· Take care of the port as told by your health care provider. Ask your health care provider if you or a family member can get training for taking care of the port at home. A home health care nurse may also take care of the port. °· Make sure to remember what type of port you have. °Incision care ° °  ° °· Follow instructions from your health care provider about how to take care of your port insertion site. Make sure you: °? Wash your hands with soap and water before and after you change your bandage (dressing). If soap and water are not available, use hand sanitizer. °? Change your dressing as told by your health care provider. °? Leave stitches (sutures), skin glue, or adhesive strips in place. These skin closures may need to stay in place for 2 weeks or longer. If adhesive strip edges start to loosen and curl up, you may trim the loose edges. Do not remove adhesive strips completely unless your health care provider tells you to do that. °· Check your port insertion site every day for signs of infection. Check for: °? Redness, swelling, or pain. °? Fluid or blood. °? Warmth. °? Pus or a bad smell. °Activity °· Return to your normal activities as told by your health care provider. Ask your health care provider what activities are safe for you. °· Do not  lift anything that is heavier than 10 lb (4.5 kg), or the limit that you are told, until your health care provider says that it is safe. °General instructions °· Take over-the-counter and prescription medicines only as told by your health care provider. °· Do not take baths, swim, or use a hot tub until your health care provider approves. Ask your health care provider if you may take showers. You may only be allowed to take sponge baths. °· Do not drive for 24 hours if you were given a sedative during your procedure. °· Wear a medical alert bracelet in case of an emergency. This will tell any health care providers that you have a port. °· Keep all follow-up visits as told by your health care provider. This is important. °Contact a health care provider if: °· You cannot flush your port with saline as directed, or you cannot draw blood from the port. °· You have a fever or chills. °· You have redness, swelling, or pain around your port insertion site. °· You have fluid or blood coming from your port insertion site. °· Your port insertion site feels warm to the touch. °· You have pus or a bad smell coming from the port insertion site. °Get help right away if: °· You have chest pain or shortness of breath. °· You have bleeding from your port that you cannot control. °Summary °· Take care of the port as told by your health   care provider. Keep the manufacturer's information card with you at all times. °· Change your dressing as told by your health care provider. °· Contact a health care provider if you have a fever or chills or if you have redness, swelling, or pain around your port insertion site. °· Keep all follow-up visits as told by your health care provider. °This information is not intended to replace advice given to you by your health care provider. Make sure you discuss any questions you have with your health care provider. °Document Released: 09/26/2013 Document Revised: 07/04/2018 Document Reviewed:  07/04/2018 °Elsevier Patient Education © 2020 [employer]. ° °Moderate Conscious Sedation, Adult, Care After °These instructions provide you with information about caring for yourself after your procedure. Your health care provider may also give you more specific instructions. Your treatment has been planned according to current medical practices, but problems sometimes occur. Call your health care provider if you have any problems or questions after your procedure. °What can I expect after the procedure? °After your procedure, it is common: °· To feel sleepy for several hours. °· To feel clumsy and have poor balance for several hours. °· To have poor judgment for several hours. °· To vomit if you eat too soon. °Follow these instructions at home: °For at least 24 hours after the procedure: ° °· Do not: °? Participate in activities where you could fall or become injured. °? Drive. °? Use heavy machinery. °? Drink alcohol. °? Take sleeping pills or medicines that cause drowsiness. °? Make important decisions or sign legal documents. °? Take care of children on your own. °· Rest. °Eating and drinking °· Follow the diet recommended by your health care provider. °· If you vomit: °? Drink water, juice, or soup when you can drink without vomiting. °? Make sure you have little or no nausea before eating solid foods. °General instructions °· Have a responsible adult stay with you until you are awake and alert. °· Take over-the-counter and prescription medicines only as told by your health care provider. °· If you smoke, do not smoke without supervision. °· Keep all follow-up visits as told by your health care provider. This is important. °Contact a health care provider if: °· You keep feeling nauseous or you keep vomiting. °· You feel light-headed. °· You develop a rash. °· You have a fever. °Get help right away if: °· You have trouble breathing. °This information is not intended to replace advice given to you by your  health care provider. Make sure you discuss any questions you have with your health care provider. °Document Released: 09/26/2013 Document Revised: 11/18/2017 Document Reviewed: 03/27/2016 °Elsevier Patient Education © 2020 [employer] Inc. ° °

## 2019-12-11 NOTE — Op Note (Signed)
OPERATIVE NOTE   PROCEDURE: 1. Placement of a right IJ Infuse-a-Port  PRE-OPERATIVE DIAGNOSIS: Breast carcinoma  POST-OPERATIVE DIAGNOSIS: Same  SURGEON: Katha Cabal M.D.  ANESTHESIA: Conscious sedation was administered under my direct supervision by the interventional radiology RN. IV Versed plus fentanyl were utilized. Continuous ECG, pulse oximetry and blood pressure was monitored throughout the entire procedure. Conscious sedation was for a total of 30 minutes.  ESTIMATED BLOOD LOSS: Minimal   FINDING(S): 1.  Patent vein  SPECIMEN(S): None  INDICATIONS:   [name] is a 63 y.o. female who presents with breast carcinoma requiring chemotherapy.  She therefore requires appropriate parenteral access.  Infuse-a-Port is planned.  Risk and benefits of been reviewed.  Patient agrees to proceed..  DESCRIPTION: After obtaining full informed written consent, the patient was brought back to the special procedure suite and placed in the supine position. The patient's right neck and chest wall are prepped and draped in sterile fashion. Appropriate timeout was called.  Ultrasound is placed in a sterile sleeve, ultrasound is utilized to avoid vascular injury as well as secondary to lack of appropriate landmarks. The right internal jugular vein is identified. It is echolucent and homogeneous as well as easily compressible indicating patency. An image is recorded for the permanent record.  Access to the vein with a micropuncture needle is done under direct ultrasound visualization.  1% lidocaine is infiltrated into the soft tissue at the base of the neck as well as on the chest wall.  Under direct ultrasound visualization a micro-needle is inserted into the vein followed by the micro-wire. Micro-sheath was then advanced and a J wire is inserted without difficulty under fluoroscopic guidance. A small counterincision was created at the wire insertion site. A transverse incision is created 2  fingerbreadths below the scapula and a pocket is fashioned using both blunt and sharp dissection. The pocket is tested for appropriate size with the hub of the Infuse-a-Port. The tunneling device is then used to pull the intravascular portion of the catheter from the pocket to the neck counterincision.  Dilator and peel-away sheath were then inserted over the wire and the wire is removed. Catheter is then advanced into the venous system without difficulty. Peel-away sheath was then removed.  Catheter is then positioned under fluoroscopic guidance at the atrial caval junction. It is then transected connected to the hub and the hope is slipped into the subcutaneous pocket on the chest wall. The hub was then accessed percutaneously and aspirates easily and flushes well and is flushed with 30 cc of heparinized saline. The pocket incision is then closed in layers using interrupted 3-0 Vicryl for the subcutaneous tissues and 4-0 Monocryl subcuticular for skin closure. Dermabond is applied. The neck counterincision was closed with 4-0 Monocryl subcuticular and Dermabond as well.  The patient tolerated the procedure well and there were no immediate complications.  COMPLICATIONS: None  CONDITION: Unchanged  Katha Cabal M.D. [facility] Office: [phone]   12/11/2019, 1:56 PM

## 2019-12-11 NOTE — H&P (Signed)
[facility] History & Physical Update  The patient was interviewed and re-examined.  The patient's previous History and Physical has been reviewed and is unchanged.  There is no change in the plan of care. We plan to proceed with the scheduled procedure.  Hortencia Pilar, MD  12/11/2019, 12:43 PM

## 2019-12-20 ENCOUNTER — Ambulatory Visit: Payer: PPO

## 2019-12-20 ENCOUNTER — Other Ambulatory Visit: Payer: PPO

## 2019-12-20 ENCOUNTER — Ambulatory Visit: Payer: PPO | Admitting: Oncology

## 2019-12-25 ENCOUNTER — Ambulatory Visit: Payer: PPO | Admitting: Physician Assistant

## 2019-12-27 NOTE — Patient Instructions (Signed)
Docetaxel injection What is this medicine? DOCETAXEL (doe se TAX el) is a chemotherapy drug. It targets fast dividing cells, like cancer cells, and causes these cells to die. This medicine is used to treat many types of cancers like breast cancer, certain stomach cancers, head and neck cancer, lung cancer, and prostate cancer. This medicine may be used for other purposes; ask your health care provider or pharmacist if you have questions. COMMON BRAND NAME(S): Docefrez, Taxotere What should I tell my health care provider before I take this medicine? They need to know if you have any of these conditions:  infection (especially a virus infection such as chickenpox, cold sores, or herpes)  liver disease  low blood counts, like low white cell, platelet, or red cell counts  an unusual or allergic reaction to docetaxel, polysorbate 80, other chemotherapy agents, other medicines, foods, dyes, or preservatives  pregnant or trying to get pregnant  breast-feeding How should I use this medicine? This drug is given as an infusion into a vein. It is administered in a hospital or clinic by a specially trained health care professional. Talk to your pediatrician regarding the use of this medicine in children. Special care may be needed. Overdosage: If you think you have taken too much of this medicine contact a poison control center or emergency room at once. NOTE: This medicine is only for you. Do not share this medicine with others. What if I miss a dose? It is important not to miss your dose. Call your doctor or health care professional if you are unable to keep an appointment. What may interact with this medicine?  aprepitant  certain antibiotics like erythromycin or clarithromycin  certain antivirals for HIV or hepatitis  certain medicines for fungal infections like fluconazole, itraconazole, ketoconazole, posaconazole, or  voriconazole  cimetidine  ciprofloxacin  conivaptan  cyclosporine  dronedarone  fluvoxamine  grapefruit juice  imatinib  verapamil This list may not describe all possible interactions. Give your health care provider a list of all the medicines, herbs, non-prescription drugs, or dietary supplements you use. Also tell them if you smoke, drink alcohol, or use illegal drugs. Some items may interact with your medicine. What should I watch for while using this medicine? Your condition will be monitored carefully while you are receiving this medicine. You will need important blood work done while you are taking this medicine. Call your doctor or health care professional for advice if you get a fever, chills or sore throat, or other symptoms of a cold or flu. Do not treat yourself. This drug decreases your body's ability to fight infections. Try to avoid being around people who are sick. Some products may contain alcohol. Ask your health care professional if this medicine contains alcohol. Be sure to tell all health care professionals you are taking this medicine. Certain medicines, like metronidazole and disulfiram, can cause an unpleasant reaction when taken with alcohol. The reaction includes flushing, headache, nausea, vomiting, sweating, and increased thirst. The reaction can last from 30 minutes to several hours. You may get drowsy or dizzy. Do not drive, use machinery, or do anything that needs mental alertness until you know how this medicine affects you. Do not stand or sit up quickly, especially if you are an older patient. This reduces the risk of dizzy or fainting spells. Alcohol may interfere with the effect of this medicine. Talk to your health care professional about your risk of cancer. You may be more at risk for certain types of cancer if  you take this medicine. Do not become pregnant while taking this medicine or for 6 months after stopping it. Women should inform their doctor if  they wish to become pregnant or think they might be pregnant. There is a potential for serious side effects to an unborn child. Talk to your health care professional or pharmacist for more information. Do not breast-feed an infant while taking this medicine or for 1 week after stopping it. Males who get this medicine must use a condom during sex with females who can get pregnant. If you get a woman pregnant, the baby could have birth defects. The baby could die before they are born. You will need to continue wearing a condom for 3 months after stopping the medicine. Tell your health care provider right away if your partner becomes pregnant while you are taking this medicine. This may interfere with the ability to father a child. You should talk to your doctor or health care professional if you are concerned about your fertility. What side effects may I notice from receiving this medicine? Side effects that you should report to your doctor or health care professional as soon as possible:  allergic reactions like skin rash, itching or hives, swelling of the face, lips, or tongue  blurred vision  breathing problems  changes in vision  low blood counts - This drug may decrease the number of white blood cells, red blood cells and platelets. You may be at increased risk for infections and bleeding.  nausea and vomiting  pain, redness or irritation at site where injected  pain, tingling, numbness in the hands or feet  redness, blistering, peeling, or loosening of the skin, including inside the mouth  signs of decreased platelets or bleeding - bruising, pinpoint red spots on the skin, black, tarry stools, nosebleeds  signs of decreased red blood cells - unusually weak or tired, fainting spells, lightheadedness  signs of infection - fever or chills, cough, sore throat, pain or difficulty passing urine  swelling of the ankle, feet, hands Side effects that usually do not require medical attention  (report to your doctor or health care professional if they continue or are bothersome):  constipation  diarrhea  fingernail or toenail changes  hair loss  loss of appetite  mouth sores  muscle pain This list may not describe all possible side effects. Call your doctor for medical advice about side effects. You may report side effects to FDA at 1-800-FDA-1088. Where should I keep my medicine? This drug is given in a hospital or clinic and will not be stored at home. NOTE: This sheet is a summary. It may not cover all possible information. If you have questions about this medicine, talk to your doctor, pharmacist, or health care provider.  2020 Elsevier/Gold Standard (2019-08-02 10:19:06) Cyclophosphamide Injection What is this medicine? CYCLOPHOSPHAMIDE (sye kloe FOSS fa mide) is a chemotherapy drug. It slows the growth of cancer cells. This medicine is used to treat many types of cancer like lymphoma, myeloma, leukemia, breast cancer, and ovarian cancer, to name a few. This medicine may be used for other purposes; ask your health care provider or pharmacist if you have questions. COMMON BRAND NAME(S): Cytoxan, Neosar What should I tell my health care provider before I take this medicine? They need to know if you have any of these conditions:  heart disease  history of irregular heartbeat  infection  kidney disease  liver disease  low blood counts, like white cells, platelets, or red blood cells    on hemodialysis  recent or ongoing radiation therapy  scarring or thickening of the lungs  trouble passing urine  an unusual or allergic reaction to cyclophosphamide, other medicines, foods, dyes, or preservatives  pregnant or trying to get pregnant  breast-feeding How should I use this medicine? This drug is usually given as an injection into a vein or muscle or by infusion into a vein. It is administered in a hospital or clinic by a specially trained health care  professional. Talk to your pediatrician regarding the use of this medicine in children. Special care may be needed. Overdosage: If you think you have taken too much of this medicine contact a poison control center or emergency room at once. NOTE: This medicine is only for you. Do not share this medicine with others. What if I miss a dose? It is important not to miss your dose. Call your doctor or health care professional if you are unable to keep an appointment. What may interact with this medicine?  amphotericin B  azathioprine  certain antivirals for HIV or hepatitis  certain medicines for blood pressure, heart disease, irregular heart beat  certain medicines that treat or prevent blood clots like warfarin  certain other medicines for cancer  cyclosporine  etanercept  indomethacin  medicines that relax muscles for surgery  medicines to increase blood counts  metronidazole This list may not describe all possible interactions. Give your health care provider a list of all the medicines, herbs, non-prescription drugs, or dietary supplements you use. Also tell them if you smoke, drink alcohol, or use illegal drugs. Some items may interact with your medicine. What should I watch for while using this medicine? Your condition will be monitored carefully while you are receiving this medicine. You may need blood work done while you are taking this medicine. Drink water or other fluids as directed. Urinate often, even at night. Some products may contain alcohol. Ask your health care professional if this medicine contains alcohol. Be sure to tell all health care professionals you are taking this medicine. Certain medicines, like metronidazole and disulfiram, can cause an unpleasant reaction when taken with alcohol. The reaction includes flushing, headache, nausea, vomiting, sweating, and increased thirst. The reaction can last from 30 minutes to several hours. Do not become pregnant while  taking this medicine or for 1 year after stopping it. Women should inform their health care professional if they wish to become pregnant or think they might be pregnant. Men should not father a child while taking this medicine and for 4 months after stopping it. There is potential for serious side effects to an unborn child. Talk to your health care professional for more information. Do not breast-feed an infant while taking this medicine or for 1 week after stopping it. This medicine has caused ovarian failure in some women. This medicine may make it more difficult to get pregnant. Talk to your health care professional if you are concerned about your fertility. This medicine has caused decreased sperm counts in some men. This may make it more difficult to father a child. Talk to your health care professional if you are concerned about your fertility. Call your health care professional for advice if you get a fever, chills, or sore throat, or other symptoms of a cold or flu. Do not treat yourself. This medicine decreases your body's ability to fight infections. Try to avoid being around people who are sick. Avoid taking medicines that contain aspirin, acetaminophen, ibuprofen, naproxen, or ketoprofen unless instructed by  your health care professional. These medicines may hide a fever. Talk to your health care professional about your risk of cancer. You may be more at risk for certain types of cancer if you take this medicine. If you are going to need surgery or other procedure, tell your health care professional that you are using this medicine. Be careful brushing or flossing your teeth or using a toothpick because you may get an infection or bleed more easily. If you have any dental work done, tell your dentist you are receiving this medicine. What side effects may I notice from receiving this medicine? Side effects that you should report to your doctor or health care professional as soon as  possible:  allergic reactions like skin rash, itching or hives, swelling of the face, lips, or tongue  breathing problems  nausea, vomiting  signs and symptoms of bleeding such as bloody or black, tarry stools; red or dark brown urine; spitting up blood or brown material that looks like coffee grounds; red spots on the skin; unusual bruising or bleeding from the eyes, gums, or nose  signs and symptoms of heart failure like fast, irregular heartbeat, sudden weight gain; swelling of the ankles, feet, hands  signs and symptoms of infection like fever; chills; cough; sore throat; pain or trouble passing urine  signs and symptoms of kidney injury like trouble passing urine or change in the amount of urine  signs and symptoms of liver injury like dark yellow or brown urine; general ill feeling or flu-like symptoms; light-colored stools; loss of appetite; nausea; right upper belly pain; unusually weak or tired; yellowing of the eyes or skin Side effects that usually do not require medical attention (report to your doctor or health care professional if they continue or are bothersome):  confusion  decreased hearing  diarrhea  facial flushing  hair loss  headache  loss of appetite  missed menstrual periods  signs and symptoms of low red blood cells or anemia such as unusually weak or tired; feeling faint or lightheaded; falls  skin discoloration This list may not describe all possible side effects. Call your doctor for medical advice about side effects. You may report side effects to FDA at 1-800-FDA-1088. Where should I keep my medicine? This drug is given in a hospital or clinic and will not be stored at home. NOTE: This sheet is a summary. It may not cover all possible information. If you have questions about this medicine, talk to your doctor, pharmacist, or health care provider.  2020 Elsevier/Gold Standard (2019-09-10 09:53:29)

## 2019-12-28 ENCOUNTER — Encounter: Payer: Self-pay | Admitting: *Deleted

## 2019-12-31 ENCOUNTER — Other Ambulatory Visit: Payer: Self-pay

## 2019-12-31 ENCOUNTER — Inpatient Hospital Stay: Payer: PPO | Attending: Oncology

## 2019-12-31 ENCOUNTER — Inpatient Hospital Stay (HOSPITAL_BASED_OUTPATIENT_CLINIC_OR_DEPARTMENT_OTHER): Payer: PPO | Admitting: Oncology

## 2019-12-31 DIAGNOSIS — Z794 Long term (current) use of insulin: Secondary | ICD-10-CM | POA: Diagnosis not present

## 2019-12-31 DIAGNOSIS — Z803 Family history of malignant neoplasm of breast: Secondary | ICD-10-CM | POA: Insufficient documentation

## 2019-12-31 DIAGNOSIS — Z801 Family history of malignant neoplasm of trachea, bronchus and lung: Secondary | ICD-10-CM | POA: Insufficient documentation

## 2019-12-31 DIAGNOSIS — I428 Other cardiomyopathies: Secondary | ICD-10-CM | POA: Diagnosis not present

## 2019-12-31 DIAGNOSIS — C50812 Malignant neoplasm of overlapping sites of left female breast: Secondary | ICD-10-CM

## 2019-12-31 DIAGNOSIS — Z5189 Encounter for other specified aftercare: Secondary | ICD-10-CM | POA: Insufficient documentation

## 2019-12-31 DIAGNOSIS — Z7952 Long term (current) use of systemic steroids: Secondary | ICD-10-CM | POA: Insufficient documentation

## 2019-12-31 DIAGNOSIS — Z170 Estrogen receptor positive status [ER+]: Secondary | ICD-10-CM | POA: Insufficient documentation

## 2019-12-31 DIAGNOSIS — Z5111 Encounter for antineoplastic chemotherapy: Secondary | ICD-10-CM | POA: Diagnosis not present

## 2019-12-31 DIAGNOSIS — Z9012 Acquired absence of left breast and nipple: Secondary | ICD-10-CM | POA: Insufficient documentation

## 2019-12-31 DIAGNOSIS — Z87891 Personal history of nicotine dependence: Secondary | ICD-10-CM | POA: Insufficient documentation

## 2019-12-31 DIAGNOSIS — D72829 Elevated white blood cell count, unspecified: Secondary | ICD-10-CM | POA: Diagnosis not present

## 2019-12-31 DIAGNOSIS — N183 Chronic kidney disease, stage 3 unspecified: Secondary | ICD-10-CM | POA: Insufficient documentation

## 2019-12-31 DIAGNOSIS — Z8349 Family history of other endocrine, nutritional and metabolic diseases: Secondary | ICD-10-CM | POA: Diagnosis not present

## 2019-12-31 DIAGNOSIS — Z79899 Other long term (current) drug therapy: Secondary | ICD-10-CM | POA: Insufficient documentation

## 2019-12-31 DIAGNOSIS — Z8043 Family history of malignant neoplasm of testis: Secondary | ICD-10-CM | POA: Diagnosis not present

## 2019-12-31 DIAGNOSIS — I890 Lymphedema, not elsewhere classified: Secondary | ICD-10-CM | POA: Diagnosis not present

## 2019-12-31 DIAGNOSIS — Z8249 Family history of ischemic heart disease and other diseases of the circulatory system: Secondary | ICD-10-CM | POA: Diagnosis not present

## 2019-12-31 DIAGNOSIS — Z806 Family history of leukemia: Secondary | ICD-10-CM | POA: Diagnosis not present

## 2019-12-31 DIAGNOSIS — Z7982 Long term (current) use of aspirin: Secondary | ICD-10-CM | POA: Diagnosis not present

## 2019-12-31 DIAGNOSIS — E119 Type 2 diabetes mellitus without complications: Secondary | ICD-10-CM | POA: Insufficient documentation

## 2019-12-31 DIAGNOSIS — R590 Localized enlarged lymph nodes: Secondary | ICD-10-CM | POA: Diagnosis not present

## 2019-12-31 NOTE — Progress Notes (Signed)
[place]  Telephone:([phone] [phone]  Patient Care Team: [name], MD as PCP - General (Internal Medicine) Minus Breeding, MD as PCP - Cardiology (Cardiology) Jacelyn Pi, MD as Referring Physician (Endocrinology) Lahoma Rocker, MD as Consulting Physician (Rheumatology) Rico Junker, RN as Registered Nurse Theodore Demark, RN as Registered Nurse   Name of the patient: [name]  [id number]  09/14/1956   Date of visit: 12/31/19  Diagnosis- Breast Cancer-stage 1b ER/PR positive Her 2 neg  Chief complaint/Reason for visit- Initial Meeting for [facility], preparing for starting chemotherapy  Heme/Onc history:  Oncology History  Malignant neoplasm of overlapping sites of left breast in female, estrogen receptor positive ([place])  10/09/2019 Initial Diagnosis   Malignant neoplasm of overlapping sites of left breast in female, estrogen receptor positive ([place])   10/19/2019 Cancer Staging   Staging form: Breast, AJCC 8th Edition - Clinical stage from 10/19/2019: Stage IIA (cT2, cN1, cM0, G2, ER+, PR+, HER2-) - Signed by [name], MD on 10/22/2019   11/26/2019 Cancer Staging   Staging form: Breast, AJCC 8th Edition - Pathologic stage from 11/26/2019: Stage IB (pT2, pN2a, cM0, G2, ER+, PR+, HER2-) - Signed by [name], MD on 11/27/2019   01/03/2020 -  Chemotherapy   The patient had palonosetron (ALOXI) injection 0.25 mg, 0.25 mg, Intravenous,  Once, 0 of 4 cycles pegfilgrastim-jmdb (FULPHILA) injection 6 mg, 6 mg, Subcutaneous,  Once, 0 of 4 cycles cyclophosphamide (CYTOXAN) 1,160 mg in sodium chloride 0.9 % 250 mL chemo infusion, 600 mg/m2, Intravenous,  Once, 0 of 4 cycles DOCEtaxel (TAXOTERE) 140 mg in sodium chloride 0.9 % 250 mL chemo infusion, 75 mg/m2, Intravenous,  Once, 0 of 4 cycles  for chemotherapy treatment.      Interval history-Mrs. Schorr is a 64 year old female who  presents to chemo care clinic today for initial meeting in preparation for starting chemotherapy. I introduced the chemo care clinic and we discussed that the role of the clinic is to assist those who are at an increased risk of emergency room visits and/or complications during the course of chemotherapy treatment. We discussed that the increased risk takes into account factors such as age, performance status, and co-morbidities. We also discussed that for some, this might include barriers to care such as not having a primary care provider, lack of insurance/transportation, or not being able to afford medications. We discussed that the goal of the program is to help prevent unplanned ER visits and help reduce complications during chemotherapy. We do this by discussing specific risk factors to each individual and identifying ways that we can help improve these risk factors and reduce barriers to care.   ECOG FS:0 - Asymptomatic  Review of systems- Review of Systems  Constitutional: Negative.  Negative for chills, fever, malaise/fatigue and weight loss.  HENT: Negative for congestion, ear pain and tinnitus.   Eyes: Negative.  Negative for blurred vision and double vision.  Respiratory: Negative.  Negative for cough, sputum production and shortness of breath.   Cardiovascular: Negative.  Negative for chest pain, palpitations and leg swelling.  Gastrointestinal: Negative.  Negative for abdominal pain, constipation, diarrhea, nausea and vomiting.  Genitourinary: Negative for dysuria, frequency and urgency.  Musculoskeletal: Negative for back pain and falls.       Left arm swelling  Skin: Negative.  Negative for rash.  Neurological: Negative.  Negative for weakness and headaches.  Endo/Heme/Allergies: Negative.  Does not bruise/bleed  easily.  Psychiatric/Behavioral: Negative.  Negative for depression. The patient is not nervous/anxious and does not have insomnia.      Current treatment-breast TC q. 21  days x 4 cycles.  Scheduled to begin on 01/03/2020  Allergies  Allergen Reactions  . Hydralazine Swelling    Eye Swelling  . Other Other (See Comments)    Unknown blood pressure medication caused face swelling    Past Medical History:  Diagnosis Date  . Arthritis of both feet    Gout  . Cancer [facility])    Breast Cancer on left  . Chronic combined systolic and diastolic CHF (congestive heart failure) ([place])    a. Echo (06/2014): Severe LVH, EF 20-25%, no RWMA, Gr 3 DD, trivial MR, mild LAE, mild RVE, PASP 38 mmHg .>> b. Echo 2/16 EF 50-55% //  // c. Echo 02/2019: EF 50-55, mod LVH, Gr 1 DD, mild LAE   . CKD (chronic kidney disease), stage III    now stage 4  . Diabetes mellitus type 2 in obese ([place])   . Diverticular disease   . Dyspnea   . Family history of breast cancer   . Family history of leukemia   . Family history of lung cancer   . Family history of testicular cancer   . Family history of throat cancer   . Fatty liver   . History of GI diverticular bleed   . Hyperlipidemia   . Hyperphosphatemia   . Hypertension   . Hypertensive heart disease    a. Renal Artery Duplex (8/15):  no RAS  . Iron deficiency anemia   . NICM (nonischemic cardiomyopathy) ([place])    a. Nuclear (06/2014): EF 32%, apical thinning, no definite scar or ischemia;  b. Echo (2/16):  Mild LVH, EF 50-55%, Gr 2 DD, no RWMA  . Obesity   . PONV (postoperative nausea and vomiting)    after D and C  . Proteinuria   . PUD (peptic ulcer disease)   . Secondary hyperparathyroidism, renal ([place])   . Tobacco abuse   . UTI (lower urinary tract infection)   . Vaginal dryness, menopausal   . Vaginal yeast infection     Past Surgical History:  Procedure Laterality Date  . COLONOSCOPY    . DILATION AND CURETTAGE OF UTERUS    . MASTECTOMY W/ SENTINEL NODE BIOPSY Left 11/12/2019  . MASTECTOMY W/ SENTINEL NODE BIOPSY Left 11/12/2019   Procedure: LEFT MASTECTOMY WITH SENTINEL LYMPH NODE BIOPSY AND TARGETED NODE  DISSECTION;  Surgeon: Jovita Kussmaul, MD;  Location: [facility];  Service: General;  Laterality: Left;  . None    . PORTA CATH INSERTION N/A 12/11/2019   Procedure: PORTA CATH INSERTION;  Surgeon: Katha Cabal, MD;  Location: [facility];  Service: Cardiovascular;  Laterality: N/A;    Social History   Socioeconomic History  . Marital status: Widowed    Spouse name: Not on file  . Number of children: 2  . Years of education: 7  . Highest education level: Not on file  Occupational History  . Not on file  Tobacco Use  . Smoking status: Former Smoker    Quit date: 07/05/2014    Years since quitting: 5.4  . Smokeless tobacco: Never Used  Substance and Sexual Activity  . Alcohol use: No  . Drug use: No  . Sexual activity: Not on file  Other Topics Concern  . Not on file  Social History Narrative   Currently lives in a  house with her husband.    Fun: Watch TV.   Denies religious beliefs effecting health care.    Social Determinants of Health   Financial Resource Strain:   . Difficulty of Paying Living Expenses: Not on file  Food Insecurity:   . Worried About Charity fundraiser in the Last Year: Not on file  . Ran Out of Food in the Last Year: Not on file  Transportation Needs:   . Lack of Transportation (Medical): Not on file  . Lack of Transportation (Non-Medical): Not on file  Physical Activity:   . Days of Exercise per Week: Not on file  . Minutes of Exercise per Session: Not on file  Stress:   . Feeling of Stress : Not on file  Social Connections:   . Frequency of Communication with Friends and Family: Not on file  . Frequency of Social Gatherings with Friends and Family: Not on file  . Attends Religious Services: Not on file  . Active Member of Clubs or Organizations: Not on file  . Attends Archivist Meetings: Not on file  . Marital Status: Not on file  Intimate Partner Violence:   . Fear of Current or Ex-Partner: Not on file  . Emotionally  Abused: Not on file  . Physically Abused: Not on file  . Sexually Abused: Not on file    Family History  Problem Relation Age of Onset  . Heart attack Paternal Grandfather   . Stroke Paternal Grandmother   . Hypertension Paternal Grandmother   . Kidney disease Mother   . Diabetes Mother   . Testicular cancer Father        diagnosed older than 67  . Coronary artery disease Other   . Hypertension Other   . Breast cancer Sister        diagnosed late 49s, negative genetics  . Leukemia Brother        chronic  . Throat cancer Brother   . Pneumonia Sister 2  . Lung cancer Paternal Uncle        diagnosed over 70     Current Outpatient Medications:  .  acetaminophen (TYLENOL) 500 MG tablet, Take 1,000 mg by mouth every 6 (six) hours as needed for mild pain, moderate pain or headache. For pain, Disp: , Rfl:  .  allopurinol (ZYLOPRIM) 100 MG tablet, Take 200 mg by mouth daily., Disp: , Rfl:  .  amLODipine (NORVASC) 5 MG tablet, Take 1 tablet (5 mg total) by mouth daily. (Patient taking differently: Take 5 mg by mouth at bedtime. ), Disp: 90 tablet, Rfl: 3 .  aspirin EC 81 MG EC tablet, Take 1 tablet (81 mg total) by mouth daily., Disp: , Rfl:  .  atorvastatin (LIPITOR) 40 MG tablet, Take 1 tablet (40 mg total) by mouth daily at 6 PM., Disp: 90 tablet, Rfl: 3 .  B-D ULTRAFINE III SHORT PEN 31G X 8 MM MISC, USE AS DIRECTED *EMERGENCY FILL*, Disp: , Rfl: 0 .  blood glucose meter kit and supplies KIT, Dispense based on patient and insurance preference. Use up to four times daily as directed. (FOR ICD-9 250.00, 250.01)., Disp: 1 each, Rfl: 0 .  calcitRIOL (ROCALTROL) 0.25 MCG capsule, Take 0.25 mcg by mouth daily., Disp: , Rfl:  .  cloNIDine (CATAPRES) 0.1 MG tablet, Take 1 tablet (0.1 mg total) by mouth 2 (two) times daily., Disp: 180 tablet, Rfl: 3 .  Colchicine 0.6 MG CAPS, Take 0.6 mg by mouth every other day. ,  Disp: , Rfl:  .  dexamethasone (DECADRON) 4 MG tablet, Take 2 tablets (8 mg  total) by mouth 2 (two) times daily. Start the day before Taxotere. Then again the day after chemo for 3 days., Disp: 30 tablet, Rfl: 1 .  furosemide (LASIX) 40 MG tablet, Take 40 mg by mouth 2 (two) times daily., Disp: , Rfl:  .  gabapentin (NEURONTIN) 100 MG capsule, Take 100 mg by mouth 3 (three) times daily., Disp: , Rfl:  .  HYDROcodone-acetaminophen (NORCO/VICODIN) 5-325 MG tablet, Take 1-2 tablets by mouth every 6 (six) hours as needed for moderate pain. (Patient not taking: Reported on 12/11/2019), Disp: 10 tablet, Rfl: 0 .  insulin aspart (NOVOLOG FLEXPEN) 100 UNIT/ML FlexPen, Inject 5 Units into the skin 3 (three) times daily with meals. Additional insulin (5 units PLUS) CBG 70 - 120: 0 units CBG 121 - 150: 1 unit CBG 151 - 200: 2 units CBG 201 - 250: 3 units CBG 251 - 350: 4 units CBG 351 - 400: 6 units (Patient taking differently: Inject 5 Units into the skin 3 (three) times daily with meals. Sliding scale  5 units/ plus base), Disp: 15 mL, Rfl: 11 .  insulin glargine (LANTUS) 100 UNIT/ML injection, Inject 20 Units into the skin at bedtime. , Disp: , Rfl:  .  isosorbide mononitrate (IMDUR) 120 MG 24 hr tablet, TAKE 1/2 TABLET DAILY = 60 MG DAILY (Patient taking differently: Take 60 mg by mouth daily. ), Disp: 45 tablet, Rfl: 3 .  lidocaine-prilocaine (EMLA) cream, Apply to affected area once, Disp: 30 g, Rfl: 3 .  LORazepam (ATIVAN) 0.5 MG tablet, Take 1 tablet (0.5 mg total) by mouth every 6 (six) hours as needed (Nausea or vomiting)., Disp: 30 tablet, Rfl: 0 .  Menthol, Topical Analgesic, (ICY HOT EX), Apply 1 application topically daily as needed (pain)., Disp: , Rfl:  .  metoprolol tartrate (LOPRESSOR) 25 MG tablet, Take 1 tablet (25 mg total) by mouth 2 (two) times daily., Disp: 180 tablet, Rfl: 3 .  ondansetron (ZOFRAN) 8 MG tablet, Take 1 tablet (8 mg total) by mouth 2 (two) times daily as needed for refractory nausea / vomiting. Start on day 3 after chemo., Disp: 30 tablet, Rfl: 1 .   ONE TOUCH ULTRA TEST test strip, 1 each by Other route 4 (four) times daily. , Disp: , Rfl: 1 .  ONETOUCH DELICA LANCETS 67E MISC, Inject 33 g into the skin 4 (four) times daily. , Disp: , Rfl: 1 .  polyethylene glycol (MIRALAX / GLYCOLAX) 17 g packet, Take 17 g by mouth daily as needed for moderate constipation., Disp: , Rfl:  .  prochlorperazine (COMPAZINE) 10 MG tablet, Take 1 tablet (10 mg total) by mouth every 6 (six) hours as needed (Nausea or vomiting)., Disp: 30 tablet, Rfl: 1 .  telmisartan (MICARDIS) 20 MG tablet, Take 1 tablet (20 mg total) by mouth daily. (Patient taking differently: Take 20 mg by mouth at bedtime. ), Disp: 90 tablet, Rfl: 3 .  trolamine salicylate (ASPERCREME) 10 % cream, Apply 1 application topically as needed for muscle pain., Disp: , Rfl:   Physical exam: There were no vitals filed for this visit. Physical Exam Constitutional:      Appearance: Normal appearance.  HENT:     Head: Normocephalic and atraumatic.  Eyes:     Pupils: Pupils are equal, round, and reactive to light.  Cardiovascular:     Rate and Rhythm: Normal rate and regular rhythm.  Heart sounds: Normal heart sounds. No murmur.  Pulmonary:     Effort: Pulmonary effort is normal.     Breath sounds: Normal breath sounds. No wheezing.  Abdominal:     General: Bowel sounds are normal. There is no distension.     Palpations: Abdomen is soft.     Tenderness: There is no abdominal tenderness.  Musculoskeletal:        General: Normal range of motion.     Cervical back: Normal range of motion.  Lymphadenopathy:     Comments: Right arm swelling/lymphedema  Skin:    General: Skin is warm and dry.     Findings: No rash.  Neurological:     Mental Status: She is alert and oriented to person, place, and time.  Psychiatric:        Judgment: Judgment normal.      CMP Latest Ref Rng & Units 11/12/2019  Glucose 70 - 99 mg/dL 147(H)  BUN 8 - 23 mg/dL 39(H)  Creatinine 0.44 - 1.00 mg/dL 1.77(H)    Sodium 135 - 145 mmol/L 139  Potassium 3.5 - 5.1 mmol/L 4.2  Chloride 98 - 111 mmol/L 101  CO2 22 - 32 mmol/L 26  Calcium 8.9 - 10.3 mg/dL 9.3  Total Protein 6.5 - 8.1 g/dL 7.8  Total Bilirubin 0.3 - 1.2 mg/dL 0.9  Alkaline Phos 38 - 126 U/L 109  AST 15 - 41 U/L 19  ALT 0 - 44 U/L 20   CBC Latest Ref Rng & Units 11/12/2019  WBC 4.0 - 10.5 K/uL 7.4  Hemoglobin 12.0 - 15.0 g/dL 13.5  Hematocrit 36.0 - 46.0 % 42.9  Platelets 150 - 400 K/uL 254    No images are attached to the encounter.  PERIPHERAL VASCULAR CATHETERIZATION  Result Date: 12/11/2019 See op note  Assessment and plan- Patient is a 64 y.o. female who presents to [facility] for initial meeting in preparation for starting chemotherapy for the treatment of stage Ib left breast cancer ER/PR positive HER-2/neu negative.  Beginning TC q. 21 days x 4 cycles on Thursday, 01/03/2020.   1. HPI: Patient is a 64 year old female with a past medical history significant for hypertension, CKD, diabetes, nonischemic cardiomyopathy among other medical problems. Mammogram on October 2020 was completed after she had a palpable area of concern in the left breast which showed an irregular mass measuring 3.1 x 2.4 x 4.5 cm.  The nodularity extended towards the nipple.  Single abnormal lymph node in left axilla with cortical thickness.  Both the breast mass and the lymph node were biopsied and showed invasive mammary carcinoma.  Had PET scan which showed hypermetabolic activity in left axillary lymph node.  No evidence of distant metastatic disease.  There was small borderline enlarged retroperitoneal lymph node 10 mm in size which is an SUV of 4.3.  Retroperitoneal lymph node will require follow-up in the future.  MRI showed 2 sites of biopsy-proven malignancy in the lateral left breast.  She appears to be recovering well since mastectomy on 11/12/2019 by Dr. Marlou Starks.  Had port placed on 12/11/2019 by Dr. Delana Meyer.   Patient will receive  adjuvant radiation after chemotherapy.  2. Chemo Care Clinic/High Risk for ER/Hospitalization during chemotherapy- We discussed the role of the chemo care clinic and identified patient specific risk factors. I discussed that patient was identified as high risk primarily based on: Comorbidities.  Patient has past medical history positive for: Past Medical History:  Diagnosis Date  . Arthritis of both  feet    Gout  . Cancer [facility])    Breast Cancer on left  . Chronic combined systolic and diastolic CHF (congestive heart failure) ([place])    a. Echo (06/2014): Severe LVH, EF 20-25%, no RWMA, Gr 3 DD, trivial MR, mild LAE, mild RVE, PASP 38 mmHg .>> b. Echo 2/16 EF 50-55% //  // c. Echo 02/2019: EF 50-55, mod LVH, Gr 1 DD, mild LAE   . CKD (chronic kidney disease), stage III    now stage 4  . Diabetes mellitus type 2 in obese ([place])   . Diverticular disease   . Dyspnea   . Family history of breast cancer   . Family history of leukemia   . Family history of lung cancer   . Family history of testicular cancer   . Family history of throat cancer   . Fatty liver   . History of GI diverticular bleed   . Hyperlipidemia   . Hyperphosphatemia   . Hypertension   . Hypertensive heart disease    a. Renal Artery Duplex (8/15):  no RAS  . Iron deficiency anemia   . NICM (nonischemic cardiomyopathy) ([place])    a. Nuclear (06/2014): EF 32%, apical thinning, no definite scar or ischemia;  b. Echo (2/16):  Mild LVH, EF 50-55%, Gr 2 DD, no RWMA  . Obesity   . PONV (postoperative nausea and vomiting)    after D and C  . Proteinuria   . PUD (peptic ulcer disease)   . Secondary hyperparathyroidism, renal ([place])   . Tobacco abuse   . UTI (lower urinary tract infection)   . Vaginal dryness, menopausal   . Vaginal yeast infection     Patient has past surgical history positive for: Past Surgical History:  Procedure Laterality Date  . COLONOSCOPY    . DILATION AND CURETTAGE OF UTERUS    . MASTECTOMY W/  SENTINEL NODE BIOPSY Left 11/12/2019  . MASTECTOMY W/ SENTINEL NODE BIOPSY Left 11/12/2019   Procedure: LEFT MASTECTOMY WITH SENTINEL LYMPH NODE BIOPSY AND TARGETED NODE DISSECTION;  Surgeon: Jovita Kussmaul, MD;  Location: [facility];  Service: General;  Laterality: Left;  . None    . PORTA CATH INSERTION N/A 12/11/2019   Procedure: PORTA CATH INSERTION;  Surgeon: Katha Cabal, MD;  Location: [facility];  Service: Cardiovascular;  Laterality: N/A;    Based on our high risk symptom management report; this patient has a high risk of ED utilization.  The percentage below indicates how "at risk "  this patient based on the factors in this table within one year.   53% High Risk Risk of Admission or ED Visit  This score indicates an adult patient's 1-year risk, as a percentage, of a hospital admission or ED visit.    0%  53%   40%   20%   5 months ago 3 months ago 8 weeks ago Today      5% 07/03/19 2000   8% 11/12/19 1024   53% 12/11/19 1020   53% 12/31/19 1017    Factor Value  Current PCP [name], [facility] Admissions 2   ED Visits 0  Has Medicaid No  Has Medicare Yes  In relationship No  Has Anemia Yes   Has asthma No  Has atrial fibrillation No  Has CVD Yes  Has chronic kidney disease Yes  Has Chronic Obstructive Pulmonary Disease No  Has Congestive Heart Failure Yes  Has Connective Tissue Disorder No  Has Depression No  Has Diabetes Yes  Has liver disease No  Has Peripheral Vascular Disease No   3. We discussed that social determinants of health may have significant impacts on health and outcomes for cancer patients.  Today we discussed specific social determinants of performance status, alcohol use, depression, financial needs, food insecurity, housing, interpersonal violence, social connections, stress, tobacco use, and transportation.    After lengthy discussion the following were identified as areas of need: Possible transportation to and from treatments as  well as referral for occupational therapy.  We discussed financial support in depth.  Barnabas Lister crater's information given to patient.  We discussed options including home based and outpatient services, DME, and CARE program. We discusssed that patients who participate in regular physical activity report fewer negative impacts of cancer and treatments and report less fatigue.  We discussed self-referral to [facility] for counseling services, psychiatry for medication management, or palliative care/symptom management as well as primary care providers.  We discussed that living with cancer can create tremendous financial burden.  We discussed options for assistance. I asked that if assistance is needed in affording medications or paying bills to please let us know so that we can provide assistance.  We discussed options for food including social services.  We will also notify [facility]ab[facility] Lister crater to see if cancer center can provide support.  Patient informed of food pantry at cancer center and was provided with care package today.  Please notify nursing if un-met needs. We discussed referral to social work. Will also discuss with Elease Etienne to see if [facility] can provide support of utility bills, etc.  We discussed options for support groups at the cancer center. If interested, please notify nurse navigator to enroll.  We discussed options for managing stress including healthy eating, exercise as well as participating in no charge counseling services at the cancer center and support groups.  If these are of interest, patient can notify either myself or primary nursing team. We discussed options for management including medications and referral to quit Smart program We discussed options for transportation including acta, paratransit, bus routes, link transit, taxi/uber/lyft, and cancer center van.  I have notified primary oncology team who will help assist with arranging Lucianne Lei transportation for appointments when  needed. We also discussed options for transportation on short notice/acute visits.   We also discussed the role of the Symptom Management Clinic at [facility] for acute issues and methods of contacting clinic/provider. She denies needing specific assistance at this time and She will be followed by Tanya Nones, RN (Nurse Navigator).   Plan: Referral for occupational therapy-Maureen Dupree-Rosa Introduction to [facility] Set up for transportation-Rosa  Dispositon: RTC on 01/02/19 for assessment by Dr. Janese Banks, lab work and consideration of cycle 1 TC chemotherapy.   Visit Diagnosis 1. Malignant neoplasm of overlapping sites of left breast in female, estrogen receptor positive ([place])     Patient expressed understanding and was in agreement with this plan. She also understands that She can call clinic at any time with any questions, concerns, or complaints.   A total of (25) minutes of face-to-face time was spent with this patient with greater than 50% of that time in counseling and care-coordination.  [place] [facility] [place]  CC: Dr. Janese Banks

## 2020-01-02 ENCOUNTER — Encounter: Payer: Self-pay | Admitting: *Deleted

## 2020-01-02 NOTE — Progress Notes (Signed)
Patient is coming in for follow up she is doing well no complaints  

## 2020-01-02 NOTE — Progress Notes (Signed)
Talked to patient today.  She is to have her first chemotherapy infusion tomorrow.  States she lost her husband to a sudden death on 2019/12/26.  Offered support.  No needs at this time.  Obtained verbal consent to send her name and address to [place] for nomination for a "box of hope".  She is to call with any questions or needs.

## 2020-01-03 ENCOUNTER — Inpatient Hospital Stay: Payer: PPO

## 2020-01-03 ENCOUNTER — Inpatient Hospital Stay (HOSPITAL_BASED_OUTPATIENT_CLINIC_OR_DEPARTMENT_OTHER): Payer: PPO | Admitting: Oncology

## 2020-01-03 ENCOUNTER — Other Ambulatory Visit: Payer: Self-pay

## 2020-01-03 VITALS — BP 130/90 | HR 72 | Temp 98.1°F | Wt 183.0 lb

## 2020-01-03 DIAGNOSIS — I890 Lymphedema, not elsewhere classified: Secondary | ICD-10-CM

## 2020-01-03 DIAGNOSIS — Z170 Estrogen receptor positive status [ER+]: Secondary | ICD-10-CM

## 2020-01-03 DIAGNOSIS — Z95828 Presence of other vascular implants and grafts: Secondary | ICD-10-CM

## 2020-01-03 DIAGNOSIS — C50812 Malignant neoplasm of overlapping sites of left female breast: Secondary | ICD-10-CM

## 2020-01-03 DIAGNOSIS — Z5111 Encounter for antineoplastic chemotherapy: Secondary | ICD-10-CM | POA: Diagnosis not present

## 2020-01-03 LAB — COMPREHENSIVE METABOLIC PANEL
ALT: 22 U/L (ref 0–44)
AST: 24 U/L (ref 15–41)
Albumin: 4.2 g/dL (ref 3.5–5.0)
Alkaline Phosphatase: 126 U/L (ref 38–126)
Anion gap: 15 (ref 5–15)
BUN: 51 mg/dL — ABNORMAL HIGH (ref 8–23)
CO2: 23 mmol/L (ref 22–32)
Calcium: 9.7 mg/dL (ref 8.9–10.3)
Chloride: 99 mmol/L (ref 98–111)
Creatinine, Ser: 1.58 mg/dL — ABNORMAL HIGH (ref 0.44–1.00)
GFR calc Af Amer: 39 mL/min — ABNORMAL LOW (ref >60)
GFR calc non Af Amer: 34 mL/min — ABNORMAL LOW (ref >60)
Glucose, Bld: 308 mg/dL — ABNORMAL HIGH (ref 70–99)
Potassium: 4 mmol/L (ref 3.5–5.1)
Sodium: 137 mmol/L (ref 135–145)
Total Bilirubin: 0.6 mg/dL (ref 0.3–1.2)
Total Protein: 8 g/dL (ref 6.5–8.1)

## 2020-01-03 LAB — CBC WITH DIFFERENTIAL/PLATELET
Abs Immature Granulocytes: 0.05 10*3/uL (ref 0.00–0.07)
Basophils Absolute: 0 10*3/uL (ref 0.0–0.1)
Basophils Relative: 0 %
Eosinophils Absolute: 0 10*3/uL (ref 0.0–0.5)
Eosinophils Relative: 0 %
HCT: 40.3 % (ref 36.0–46.0)
Hemoglobin: 12.6 g/dL (ref 12.0–15.0)
Immature Granulocytes: 1 %
Lymphocytes Relative: 8 %
Lymphs Abs: 0.8 10*3/uL (ref 0.7–4.0)
MCH: 26.4 pg (ref 26.0–34.0)
MCHC: 31.3 g/dL (ref 30.0–36.0)
MCV: 84.3 fL (ref 80.0–100.0)
Monocytes Absolute: 0.2 10*3/uL (ref 0.1–1.0)
Monocytes Relative: 2 %
Neutro Abs: 8.4 10*3/uL — ABNORMAL HIGH (ref 1.7–7.7)
Neutrophils Relative %: 89 %
Platelets: 246 10*3/uL (ref 150–400)
RBC: 4.78 MIL/uL (ref 3.87–5.11)
RDW: 16.3 % — ABNORMAL HIGH (ref 11.5–15.5)
WBC: 9.4 10*3/uL (ref 4.0–10.5)
nRBC: 0 % (ref 0.0–0.2)

## 2020-01-03 MED ORDER — SODIUM CHLORIDE 0.9% FLUSH
10.0000 mL | Freq: Once | INTRAVENOUS | Status: AC
  Administered 2020-01-03: 08:00:00 10 mL via INTRAVENOUS
  Filled 2020-01-03: qty 10

## 2020-01-03 MED ORDER — HEPARIN SOD (PORK) LOCK FLUSH 100 UNIT/ML IV SOLN
500.0000 [IU] | Freq: Once | INTRAVENOUS | Status: AC
  Administered 2020-01-03: 500 [IU] via INTRAVENOUS
  Filled 2020-01-03: qty 5

## 2020-01-03 NOTE — Progress Notes (Signed)
Patient stated that her sugar levels have been elevated since last night ranging from ([phone]) even with her Novolog 20 units.

## 2020-01-04 ENCOUNTER — Encounter: Payer: Self-pay | Admitting: Oncology

## 2020-01-04 ENCOUNTER — Inpatient Hospital Stay: Payer: PPO

## 2020-01-04 NOTE — Progress Notes (Signed)
Hematology/Oncology Consult note [facility]  [phone] [phone]  Patient Care Team: [name], MD as PCP - General (Internal Medicine) Minus Breeding, MD as PCP - Cardiology (Cardiology) Jacelyn Pi, MD as Referring Physician (Endocrinology) Lahoma Rocker, MD as Consulting Physician (Rheumatology) Rico Junker, RN as Registered Nurse Theodore Demark, RN as Registered Nurse   Name of the patient: [name]  [id number]  1955/12/31   Date of visit: 01/04/20  Diagnosis- Invasive lobular carcinoma of the left breast pathological prognostic stage Ib pT2 pN2 acM0 ER/PR positive HER-2/neu negative s/p mastectomy and targeted node dissection  Chief complaint/ Reason for visit-on treatment assessment prior to cycle 1 of adjuvant TC chemotherapy  Heme/Onc history: Patient is a 64 year old female with a past medical history significant for hypertension, CKD, diabetes, nonischemic cardiomyopathy among other medical problems. She has not undergone a mammogram for several years. Mammogram on October 2020 was done after she had a palpable area of concern in the left breast which showed an irregular mass with increased vascularity measuring 3.1 x 2.4 x 4.5 cm. The nodularity extended towards the nipple measuring at least 5.4 cm. Single abnormal lymph node in the left axilla with cortical thickness of 0.7 cm. 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  PET scan showed hypermetabolic left axillary lymph nodes which was reviewed at tumor board and they wereat least found to be 3-4. Retroareolar left breast mass 2.5 x 3.2 cm in size. No evidence of distant metastatic disease. There were small to borderline enlarged retroperitoneal lymph nodes 10 mm in size with an SUV of  4.3.Retroperitoneal lymph nodes will require follow-up in the future  MRI showed 2 sites of biopsy-proven malignancy in the lateral left breast at posterior and anterior depth.  There is non-mass enhancement spanning between the 2 sites.  Morphologically abnormal level 1 axillary lymph node consistent with biopsy-proven metastatic disease.  Final pathology showed 2 separate tumors one which was 4.5 cm and the other one was 4.4 cm in size.  Grade 2 ER/PR positive HER-2/neu negative with negative margins. 5 out of 11 lymph nodes were positive for malignancy.  Extranodal extension present.  Largest size of metastatic deposit 14 mm.  mpT2 mpN2  Patient is not a candidate for anthracycline-based chemotherapy per cardiology   Interval history-patient took 2 doses of Decadron to prevent chemo-induced nausea vomiting and reports that her blood sugars went over 500 this morning.  She felt jittery and nauseous and felt like she almost had to go to the ER.  Prior to taking Decadron her blood sugars have been ranging between 100-130.  She has not eaten anything since morning and she feels fatigued at this time  ECOG PS- 1 Pain scale- 0 Opioid associated constipation- no  Review of systems- Review of Systems  Constitutional: Positive for malaise/fatigue. Negative for chills, fever and weight loss.  HENT: Negative for congestion, ear discharge and nosebleeds.   Eyes: Negative for blurred vision.  Respiratory: Negative for cough, hemoptysis, sputum production, shortness of breath and wheezing.   Cardiovascular: Negative for chest pain, palpitations, orthopnea and claudication.  Gastrointestinal: Negative for abdominal pain, blood in stool, constipation, diarrhea, heartburn, melena, nausea and vomiting.  Genitourinary: Negative for dysuria, flank pain, frequency, hematuria and urgency.  Musculoskeletal: Negative for back pain, joint pain and myalgias.  Skin: Negative for rash.  Neurological: Negative  for dizziness, tingling, focal weakness, seizures, weakness and headaches.  Endo/Heme/Allergies: Does not bruise/bleed easily.  Psychiatric/Behavioral: Negative for depression and suicidal ideas. The patient does not have insomnia.       Allergies  Allergen Reactions   Hydralazine Swelling    Eye Swelling   Other Other (See Comments)    Unknown blood pressure medication caused face swelling     Past Medical History:  Diagnosis Date   Arthritis of both feet    Gout   Cancer ([place])    Breast Cancer on left   Chronic combined systolic and diastolic CHF (congestive heart failure) ([place])    a. Echo (06/2014): Severe LVH, EF 20-25%, no RWMA, Gr 3 DD, trivial MR, mild LAE, mild RVE, PASP 38 mmHg .>> b. Echo 2/16 EF 50-55% //  // c. Echo 02/2019: EF 50-55, mod LVH, Gr 1 DD, mild LAE    CKD (chronic kidney disease), stage III    now stage 4   Diabetes mellitus type 2 in obese [facility])    Diverticular disease    Dyspnea    Family history of breast cancer    Family history of leukemia    Family history of lung cancer    Family history of testicular cancer    Family history of throat cancer    Fatty liver    History of GI diverticular bleed    Hyperlipidemia    Hyperphosphatemia    Hypertension    Hypertensive heart disease    a. Renal Artery Duplex (8/15):  no RAS   Iron deficiency anemia    NICM (nonischemic cardiomyopathy) ([place])    a. Nuclear (06/2014): EF 32%, apical thinning, no definite scar or ischemia;  b. Echo (2/16):  Mild LVH, EF 50-55%, Gr 2 DD, no RWMA   Obesity    PONV (postoperative nausea and vomiting)    after D and C   Proteinuria    PUD (peptic ulcer disease)    Secondary hyperparathyroidism, renal ([facility])    Tobacco abuse    UTI (lower urinary tract infection)    Vaginal dryness, menopausal    Vaginal yeast infection      Past Surgical History:  Procedure Laterality Date   COLONOSCOPY     DILATION AND CURETTAGE OF UTERUS       MASTECTOMY W/ SENTINEL NODE BIOPSY Left 11/12/2019   MASTECTOMY W/ SENTINEL NODE BIOPSY Left 11/12/2019   Procedure: LEFT MASTECTOMY WITH SENTINEL LYMPH NODE BIOPSY AND TARGETED NODE DISSECTION;  Surgeon: Jovita Kussmaul, MD;  Location: [place];  Service: General;  Laterality: Left;   None     PORTA CATH INSERTION N/A 12/11/2019   Procedure: PORTA CATH INSERTION;  Surgeon: Katha Cabal, MD;  Location: [place] [facility];  Service: Cardiovascular;  Laterality: N/A;    Social History   Socioeconomic History   Marital status: Widowed    Spouse name: Not on file   Number of children: 2   Years of education: 7   Highest education level: Not on file  Occupational History   Not on file  Tobacco Use   Smoking status: Former Smoker    Quit date: 07/05/2014    Years since quitting: 5.5   Smokeless tobacco: Never Used  Substance and Sexual Activity   Alcohol use: No   Drug use: No   Sexual activity: Not on file  Other Topics Concern   Not on file  Social History Narrative  Currently lives in a house with her husband.    Fun: Watch TV.   Denies religious beliefs effecting health care.    Social Determinants of Health   Financial Resource Strain:    Difficulty of Paying Living Expenses: Not on file  Food Insecurity:    Worried About Charity fundraiser in the Last Year: Not on file   [employer] of Food in the Last Year: Not on file  Transportation Needs:    Lack of Transportation (Medical): Not on file   Lack of Transportation (Non-Medical): Not on file  Physical Activity:    Days of Exercise per Week: Not on file   Minutes of Exercise per Session: Not on file  Stress:    Feeling of Stress : Not on file  Social Connections:    Frequency of Communication with Friends and Family: Not on file   Frequency of Social Gatherings with Friends and Family: Not on file   Attends Religious Services: Not on file   Active Member of Clubs or Organizations: Not  on file   Attends Archivist Meetings: Not on file   Marital Status: Not on file  Intimate Partner Violence:    Fear of Current or Ex-Partner: Not on file   Emotionally Abused: Not on file   Physically Abused: Not on file   Sexually Abused: Not on file    Family History  Problem Relation Age of Onset   Heart attack Paternal Grandfather    Stroke Paternal Grandmother    Hypertension Paternal Grandmother    Kidney disease Mother    Diabetes Mother    Testicular cancer Father        diagnosed older than 50   Coronary artery disease Other    Hypertension Other    Breast cancer Sister        diagnosed late 33s, negative genetics   Leukemia Brother        chronic   Throat cancer Brother    Pneumonia Sister 2   Lung cancer Paternal Uncle        diagnosed over 1     Current Outpatient Medications:    acetaminophen (TYLENOL) 500 MG tablet, Take 1,000 mg by mouth every 6 (six) hours as needed for mild pain, moderate pain or headache. For pain, Disp: , Rfl:    allopurinol (ZYLOPRIM) 100 MG tablet, Take 200 mg by mouth daily., Disp: , Rfl:    amLODipine (NORVASC) 5 MG tablet, Take 1 tablet (5 mg total) by mouth daily. (Patient taking differently: Take 5 mg by mouth at bedtime. ), Disp: 90 tablet, Rfl: 3   aspirin EC 81 MG EC tablet, Take 1 tablet (81 mg total) by mouth daily., Disp: , Rfl:    atorvastatin (LIPITOR) 40 MG tablet, Take 1 tablet (40 mg total) by mouth daily at 6 PM., Disp: 90 tablet, Rfl: 3   B-D ULTRAFINE III SHORT PEN 31G X 8 MM MISC, USE AS DIRECTED *EMERGENCY FILL*, Disp: , Rfl: 0   blood glucose meter kit and supplies KIT, Dispense based on patient and insurance preference. Use up to four times daily as directed. (FOR ICD-9 250.00, 250.01)., Disp: 1 each, Rfl: 0   calcitRIOL (ROCALTROL) 0.25 MCG capsule, Take 0.25 mcg by mouth daily., Disp: , Rfl:    cloNIDine (CATAPRES) 0.1 MG tablet, Take 1 tablet (0.1 mg total) by mouth 2 (two)  times daily., Disp: 180 tablet, Rfl: 3   Colchicine 0.6 MG CAPS, Take 0.6 mg  by mouth every other day. , Disp: , Rfl:    dexamethasone (DECADRON) 4 MG tablet, Take 2 tablets (8 mg total) by mouth 2 (two) times daily. Start the day before Taxotere. Then again the day after chemo for 3 days., Disp: 30 tablet, Rfl: 1   furosemide (LASIX) 40 MG tablet, Take 40 mg by mouth 2 (two) times daily., Disp: , Rfl:    gabapentin (NEURONTIN) 100 MG capsule, Take 100 mg by mouth 3 (three) times daily., Disp: , Rfl:    HYDROcodone-acetaminophen (NORCO/VICODIN) 5-325 MG tablet, Take 1-2 tablets by mouth every 6 (six) hours as needed for moderate pain., Disp: 10 tablet, Rfl: 0   insulin aspart (NOVOLOG FLEXPEN) 100 UNIT/ML FlexPen, Inject 5 Units into the skin 3 (three) times daily with meals. Additional insulin (5 units PLUS) CBG 70 - 120: 0 units CBG 121 - 150: 1 unit CBG 151 - 200: 2 units CBG 201 - 250: 3 units CBG 251 - 350: 4 units CBG 351 - 400: 6 units (Patient taking differently: Inject 5 Units into the skin 3 (three) times daily with meals. Sliding scale  5 units/ plus base), Disp: 15 mL, Rfl: 11   insulin glargine (LANTUS) 100 UNIT/ML injection, Inject 20 Units into the skin at bedtime. , Disp: , Rfl:    isosorbide mononitrate (IMDUR) 120 MG 24 hr tablet, TAKE 1/2 TABLET DAILY = 60 MG DAILY (Patient taking differently: Take 60 mg by mouth daily. ), Disp: 45 tablet, Rfl: 3   lidocaine-prilocaine (EMLA) cream, Apply to affected area once, Disp: 30 g, Rfl: 3   LORazepam (ATIVAN) 0.5 MG tablet, Take 1 tablet (0.5 mg total) by mouth every 6 (six) hours as needed (Nausea or vomiting)., Disp: 30 tablet, Rfl: 0   Menthol, Topical Analgesic, (ICY HOT EX), Apply 1 application topically daily as needed (pain)., Disp: , Rfl:    metoprolol tartrate (LOPRESSOR) 25 MG tablet, Take 1 tablet (25 mg total) by mouth 2 (two) times daily., Disp: 180 tablet, Rfl: 3   ondansetron (ZOFRAN) 8 MG tablet, Take 1 tablet (8  mg total) by mouth 2 (two) times daily as needed for refractory nausea / vomiting. Start on day 3 after chemo., Disp: 30 tablet, Rfl: 1   ONE TOUCH ULTRA TEST test strip, 1 each by Other route 4 (four) times daily. , Disp: , Rfl: 1   ONETOUCH DELICA LANCETS 89H MISC, Inject 33 g into the skin 4 (four) times daily. , Disp: , Rfl: 1   polyethylene glycol (MIRALAX / GLYCOLAX) 17 g packet, Take 17 g by mouth daily as needed for moderate constipation., Disp: , Rfl:    prochlorperazine (COMPAZINE) 10 MG tablet, Take 1 tablet (10 mg total) by mouth every 6 (six) hours as needed (Nausea or vomiting)., Disp: 30 tablet, Rfl: 1   telmisartan (MICARDIS) 20 MG tablet, Take 1 tablet (20 mg total) by mouth daily. (Patient taking differently: Take 20 mg by mouth at bedtime. ), Disp: 90 tablet, Rfl: 3   trolamine salicylate (ASPERCREME) 10 % cream, Apply 1 application topically as needed for muscle pain., Disp: , Rfl:   Physical exam:  Vitals:   01/03/20 0842  BP: 130/90  Pulse: 72  Temp: 98.1 F (36.7 C)  TempSrc: Tympanic  SpO2: 98%  Weight: 183 lb (83 kg)   Physical Exam Constitutional:      Comments: Appears fatigued  HENT:     Head: Normocephalic and atraumatic.  Eyes:     Pupils: Pupils are  equal, round, and reactive to light.  Cardiovascular:     Rate and Rhythm: Normal rate and regular rhythm.     Heart sounds: Normal heart sounds.  Pulmonary:     Effort: Pulmonary effort is normal.     Breath sounds: Normal breath sounds.  Abdominal:     General: Bowel sounds are normal.     Palpations: Abdomen is soft.  Musculoskeletal:     Cervical back: Normal range of motion.  Skin:    General: Skin is warm and dry.  Neurological:     Mental Status: She is alert and oriented to person, place, and time.      CMP Latest Ref Rng & Units 01/03/2020  Glucose 70 - 99 mg/dL 308(H)  BUN 8 - 23 mg/dL 51(H)  Creatinine 0.44 - 1.00 mg/dL 1.58(H)  Sodium 135 - 145 mmol/L 137  Potassium 3.5 - 5.1  mmol/L 4.0  Chloride 98 - 111 mmol/L 99  CO2 22 - 32 mmol/L 23  Calcium 8.9 - 10.3 mg/dL 9.7  Total Protein 6.5 - 8.1 g/dL 8.0  Total Bilirubin 0.3 - 1.2 mg/dL 0.6  Alkaline Phos 38 - 126 U/L 126  AST 15 - 41 U/L 24  ALT 0 - 44 U/L 22   CBC Latest Ref Rng & Units 01/03/2020  WBC 4.0 - 10.5 K/uL 9.4  Hemoglobin 12.0 - 15.0 g/dL 12.6  Hematocrit 36.0 - 46.0 % 40.3  Platelets 150 - 400 K/uL 246    No images are attached to the encounter.  PERIPHERAL VASCULAR CATHETERIZATION  Result Date: 12/11/2019 See op note    Assessment and plan- Patient is a 64 y.o. female with pathological prognostic stage Ib invasive lobular carcinoma pT2 pN2 acM0 ER/PR positive HER-2/neu negative s/p left mastectomy and targeted node dissection.  Patient is here for on treatment assessment prior to cycle 1 of adjuvant TC chemotherapy  Patient's blood sugars this morning after they were 500 at home are down to 308.  However patient still feels fatigued and is concerned about taking more Decadron as premedications prior to chemotherapy.  I will therefore push her chemotherapy out by a few more days.  I have asked her not to take any Decadron for nausea vomiting prophylaxis.  I will reduce her Decadron dose to 4 mg IV for premedications prior to getting Taxotere.  She will directly proceed for cycle 1 of TC chemotherapy in a week's time with on pro-Neulasta support.  I will tentatively see her in 10 to 12 days from now to see how she tolerated her chemotherapy for possible fluids  I will refer her to OT for lymphedema    Visit Diagnosis 1. Malignant neoplasm of overlapping sites of left breast in female, estrogen receptor positive ([place])   2. Lymphedema   3. Encounter for antineoplastic chemotherapy      Dr. Randa Evens, MD, MPH [facility] [phone] 01/04/2020 1:26 PM

## 2020-01-08 ENCOUNTER — Other Ambulatory Visit: Payer: Self-pay | Admitting: Oncology

## 2020-01-08 ENCOUNTER — Inpatient Hospital Stay: Payer: PPO

## 2020-01-08 ENCOUNTER — Other Ambulatory Visit: Payer: Self-pay

## 2020-01-08 VITALS — BP 108/69 | HR 63 | Temp 96.7°F | Resp 18 | Wt 189.6 lb

## 2020-01-08 DIAGNOSIS — Z170 Estrogen receptor positive status [ER+]: Secondary | ICD-10-CM

## 2020-01-08 DIAGNOSIS — C50812 Malignant neoplasm of overlapping sites of left female breast: Secondary | ICD-10-CM

## 2020-01-08 DIAGNOSIS — Z5111 Encounter for antineoplastic chemotherapy: Secondary | ICD-10-CM | POA: Diagnosis not present

## 2020-01-08 MED ORDER — HEPARIN SOD (PORK) LOCK FLUSH 100 UNIT/ML IV SOLN
500.0000 [IU] | Freq: Once | INTRAVENOUS | Status: AC | PRN
  Administered 2020-01-08: 500 [IU]
  Filled 2020-01-08: qty 5

## 2020-01-08 MED ORDER — PALONOSETRON HCL INJECTION 0.25 MG/5ML
0.2500 mg | Freq: Once | INTRAVENOUS | Status: AC
  Administered 2020-01-08: 0.25 mg via INTRAVENOUS
  Filled 2020-01-08: qty 5

## 2020-01-08 MED ORDER — PEGFILGRASTIM 6 MG/0.6ML ~~LOC~~ PSKT
6.0000 mg | PREFILLED_SYRINGE | Freq: Once | SUBCUTANEOUS | Status: AC
  Administered 2020-01-08: 6 mg via SUBCUTANEOUS
  Filled 2020-01-08: qty 0.6

## 2020-01-08 MED ORDER — SODIUM CHLORIDE 0.9% FLUSH
10.0000 mL | INTRAVENOUS | Status: DC | PRN
  Administered 2020-01-08: 10 mL
  Filled 2020-01-08: qty 10

## 2020-01-08 MED ORDER — SODIUM CHLORIDE 0.9 % IV SOLN
75.0000 mg/m2 | Freq: Once | INTRAVENOUS | Status: AC
  Administered 2020-01-08: 140 mg via INTRAVENOUS
  Filled 2020-01-08: qty 14

## 2020-01-08 MED ORDER — HEPARIN SOD (PORK) LOCK FLUSH 100 UNIT/ML IV SOLN
INTRAVENOUS | Status: AC
  Filled 2020-01-08: qty 5

## 2020-01-08 MED ORDER — SODIUM CHLORIDE 0.9 % IV SOLN
600.0000 mg/m2 | Freq: Once | INTRAVENOUS | Status: AC
  Administered 2020-01-08: 1160 mg via INTRAVENOUS
  Filled 2020-01-08: qty 50

## 2020-01-08 MED ORDER — SODIUM CHLORIDE 0.9 % IV SOLN: 4.0000 mg | Freq: Once | INTRAVENOUS | Status: DC

## 2020-01-08 MED ORDER — SODIUM CHLORIDE 0.9 % IV SOLN
Freq: Once | INTRAVENOUS | Status: AC
  Filled 2020-01-08: qty 250

## 2020-01-08 MED ORDER — DEXAMETHASONE SODIUM PHOSPHATE 10 MG/ML IJ SOLN
4.0000 mg | Freq: Once | INTRAMUSCULAR | Status: AC
  Administered 2020-01-08: 4 mg via INTRAVENOUS
  Filled 2020-01-08: qty 1

## 2020-01-08 NOTE — Progress Notes (Signed)
MD, Dr. Janese Banks, came to see patient at chair side. Per MD verbal order: proceed with scheduled Taxotere and Cytoxan treatment at this time.  Creatinine: 1.58 on 01/03/2020. MD, Dr. Janese Banks, notified and aware. Per MD order: proceed with scheduled Taxotere and Cytoxan treatment at this time.

## 2020-01-09 ENCOUNTER — Inpatient Hospital Stay: Payer: PPO

## 2020-01-11 ENCOUNTER — Telehealth: Payer: Self-pay

## 2020-01-11 NOTE — Telephone Encounter (Signed)
Follow up call after 1st chemo she had on 01/08/2020.  Pt states felt great after receiving chemo but she has felt really "bad" after getting neulasta shot.   States taking claritin.  She reports drinking plenty of fluids but not eating well.  Feels "achy".  Explained how neulasta is forcing her bones to produce more white blood cells and could be the cause of achiness.  Pt does not feel she needs to see MD now but encouraged her to call for any questions or concerns.

## 2020-01-14 ENCOUNTER — Ambulatory Visit: Payer: PPO | Attending: Oncology | Admitting: Occupational Therapy

## 2020-01-14 ENCOUNTER — Other Ambulatory Visit: Payer: Self-pay

## 2020-01-14 ENCOUNTER — Encounter: Payer: Self-pay | Admitting: Occupational Therapy

## 2020-01-14 DIAGNOSIS — I972 Postmastectomy lymphedema syndrome: Secondary | ICD-10-CM | POA: Diagnosis not present

## 2020-01-14 NOTE — Patient Instructions (Signed)
Pt to wear temporary compression until next week  isotoner glove on L hand , soft tubigrip on hand to elbow and tubigrip E from forearm to axilla   Do scar massage and wash with washcloth her L breast and scar to get glue off

## 2020-01-14 NOTE — Therapy (Signed)
[facility] [address] [place], [address] Phone: [phone]   Fax:  [phone]  Occupational Therapy Evaluation  Patient Details  Name: [name] MRN: [id number] Date of Birth: 07-Nov-1956 Referring Provider (OT): Adela Glimpse Date: 01/14/2020  OT End of Session - 01/14/20 1603    Visit Number  1    Number of Visits  8    Date for OT Re-Evaluation  03/10/20    OT Start Time  1433    OT Stop Time  1529    OT Time Calculation (min)  56 min    Activity Tolerance  Patient tolerated treatment well    Behavior During Therapy  [facility] for tasks assessed/performed       Past Medical History:  Diagnosis Date  . Arthritis of both feet    Gout  . Cancer [facility])    Breast Cancer on left  . Chronic combined systolic and diastolic CHF (congestive heart failure) ([place])    a. Echo (06/2014): Severe LVH, EF 20-25%, no RWMA, Gr 3 DD, trivial MR, mild LAE, mild RVE, PASP 38 mmHg .>> b. Echo 2/16 EF 50-55% //  // c. Echo 02/2019: EF 50-55, mod LVH, Gr 1 DD, mild LAE   . CKD (chronic kidney disease), stage III    now stage 4  . Diabetes mellitus type 2 in obese ([place])   . Diverticular disease   . Dyspnea   . Family history of breast cancer   . Family history of leukemia   . Family history of lung cancer   . Family history of testicular cancer   . Family history of throat cancer   . Fatty liver   . History of GI diverticular bleed   . Hyperlipidemia   . Hyperphosphatemia   . Hypertension   . Hypertensive heart disease    a. Renal Artery Duplex (8/15):  no RAS  . Iron deficiency anemia   . NICM (nonischemic cardiomyopathy) ([place])    a. Nuclear (06/2014): EF 32%, apical thinning, no definite scar or ischemia;  b. Echo (2/16):  Mild LVH, EF 50-55%, Gr 2 DD, no RWMA  . Obesity   . PONV (postoperative nausea and vomiting)    after D and C  . Proteinuria   . PUD (peptic ulcer disease)   . Secondary  hyperparathyroidism, renal ([place])   . Tobacco abuse   . UTI (lower urinary tract infection)   . Vaginal dryness, menopausal   . Vaginal yeast infection     Past Surgical History:  Procedure Laterality Date  . COLONOSCOPY    . DILATION AND CURETTAGE OF UTERUS    . MASTECTOMY W/ SENTINEL NODE BIOPSY Left 11/12/2019  . MASTECTOMY W/ SENTINEL NODE BIOPSY Left 11/12/2019   Procedure: LEFT MASTECTOMY WITH SENTINEL LYMPH NODE BIOPSY AND TARGETED NODE DISSECTION;  Surgeon: Jovita Kussmaul, MD;  Location: [facility];  Service: General;  Laterality: Left;  . None    . PORTA CATH INSERTION N/A 12/11/2019   Procedure: PORTA CATH INSERTION;  Surgeon: Katha Cabal, MD;  Location: [facility];  Service: Cardiovascular;  Laterality: N/A;    There were no vitals filed for this visit.  Subjective Assessment - 01/14/20 1553    Subjective   I did not had swelling in my arm - but when I woke up from getting port - the bloodpressure cuff was on my L arm - and when I went in -  it was on my R - now the swelling stays , it do not go down anymore like my other arm    Pertinent History  Patient is a 65 y.o. 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  No/denies        [facility] OT Assessment - 01/14/20 0001      Assessment   Medical Diagnosis  L breast CA with mastectomy     Referring Provider (OT)  Janese Banks    Onset Date/Surgical Date  12/26/19    Hand Dominance  Left    Next  MD Visit  --   29th Jan      Precautions   Precaution Comments  --   Lymphedema      Home  Environment   Lives With  Family      Prior Function   Vocation  Retired    Leisure  cook and house work ,        LYMPHEDEMA/ONCOLOGY QUESTIONNAIRE - 01/14/20 1600      What other symptoms do you have   Are you Having Heaviness or Tightness  Yes    Are you having Pain  No    Are you having pitting edema  No    Is it Hard or Difficult finding clothes that fit  Yes    Do you have infections  No    Is there Decreased scar mobility  No      Lymphedema Stage   Stage  STAGE 2 SPONTANEOUSLY IRREVERSIBLE      Right Upper Extremity Lymphedema   15 cm Proximal to Olecranon Process  38 cm    10 cm Proximal to Olecranon Process  34.8 cm    Olecranon Process  27.5 cm    15 cm Proximal to Ulnar Styloid Process  25.4 cm    10 cm Proximal to Ulnar Styloid Process  21.5 cm    Just Proximal to Ulnar Styloid Process  17.5 cm    Across Hand at [employer]  19.4 cm    At [place] of 2nd Digit  6.4 cm    At [facility] of Thumb  6.8 cm      Left Upper Extremity Lymphedema   15 cm Proximal to Olecranon Process  40.5 cm    10 cm Proximal to Olecranon Process  37 cm    Olecranon Process  28.5 cm    15 cm Proximal to Ulnar Styloid Process  27 cm    10 cm Proximal to Ulnar Styloid Process  23.5 cm    Just Proximal to Ulnar Styloid Process  19.2 cm    Across Hand at [employer]  20.2 cm    At [place] of 2nd Digit  7 cm    At [facility] of Thumb  7.1 cm       ed pt and son on lymphedema -  and POC   Pt to wear temporary compression until next week  isotoner glove on L hand , soft tubigrip on hand to elbow and tubigrip E from forearm to axilla   Do scar massage and wash with washcloth her L breast and scar to get glue off             OT Education - 01/14/20 1603    Education Details  findings of eval, lymphedema and POC    Person(s) Educated  Patient;Child(ren)    Methods   Explanation;Demonstration;Tactile cues;Verbal cues;Handout    Comprehension  Verbal cues required;Returned demonstration;Verbalized understanding          OT Long Term Goals - 01/14/20 1610      OT LONG TERM GOAL #1   Title  L UE circumference decrease by at least 1 cm at wrist , forearm and upper arm to get measured for custom copmression sleeve and glove    Baseline  L wrist increase by 1.8 cm , forearm 2 cm , upper arm by 2.5 cm - no compression    Time  4    Period  Weeks    Status  New    Target Date  02/11/20      OT LONG TERM GOAL #2   Title  Pt and family to be independent in wearing of compression garments to decrease circumfernce and maintain lymphedema in L UE    Baseline  no knowledge on garments    Time  8    Period  Weeks    Status  New    Target Date  03/10/20      OT LONG TERM GOAL #3   Title  Pt and son be independent in doing HEP to increase activity tolerance and strengthening without increase lympphedema    Baseline  did not do any exercises for L UE - scar on L chest still have glue on - pt do not want to touch or have anybody look at it - no scar adhesion - pt to wash chest    Time  5    Period  Weeks    Status  New    Target Date  02/18/20            Plan - 01/14/20 1604    Clinical Impression Statement  Pt present at OT eval with stage 2 lymphedema in L UE - pt is L hand dominant and appear when pt is using arm in high risk house work activities - lymphedema do increase even more than at baseline - pt this date at baseline increase by 0.8 in hand , wrist 1.8 cm , forearm 2 cm , elbow 1 cm and upper arm 2.5 cm - pt also still have some glue on scar at chest - but incision healed and no scar adhesion notice , and AROM for L shoulder is WNL - pt fitted with temporary compression to wear for week to decrease circumference prior to getting measure for compression garments - pt to discuss her rash on arms with Dr Janese Banks on Friday - per pt it started after her  shot , after chemo -and also refer her to dietician - pt can benefit from OT services for CDT    OT Occupational Profile and History  Problem Focused Assessment - Including review of records relating to presenting problem    Occupational performance deficits (Please refer to evaluation for details):  ADL's;IADL's;Play;Leisure    Body Structure / Function / Physical Skills  UE functional use;Edema    Rehab Potential  Good    Clinical Decision Making  Limited treatment options, no task modification necessary    Comorbidities Affecting Occupational Performance:  May have comorbidities impacting occupational performance   getting chemo and then going to get radiation   Modification or Assistance to Complete Evaluation   No modification of tasks or assist necessary to complete eval    OT Frequency  1x / week    OT Duration  8 weeks    OT Treatment/Interventions  Self-care/ADL training;Manual lymph drainage;Patient/family education;Compression bandaging;Scar mobilization;Manual Therapy;Therapeutic exercise    Plan  Assess circumference after wearing temporary compression on L arm for week    OT Home Exercise Plan  see pt instruction    Consulted and Agree with Plan of Care  Patient       Patient will benefit from skilled therapeutic intervention in order to improve the following deficits and impairments:   Body Structure / Function / Physical Skills: UE functional use, Edema       Visit Diagnosis: Postmastectomy lymphedema syndrome - Plan: Ot plan of care cert/re-cert    Problem List Patient Active Problem List   Diagnosis Date Noted  . Cancer of left female breast  ([place]) 11/12/2019  . Goals of care, counseling/discussion 10/14/2019  . Family history of breast cancer   . Family history of testicular cancer   . Family history of lung cancer   . Family history of throat cancer   . Family history of leukemia   . Malignant neoplasm of overlapping sites of left breast in female, estrogen  receptor positive ([place]) 10/09/2019  . Pain in right foot 10/24/2018  . Type 2 diabetes mellitus with stage 3 chronic kidney disease, with long-term current use of insulin ([place]) 01/31/2018  . Pancreatitis, acute 12/06/2015  . ARF (acute renal failure) ([place]) 12/05/2015  . Rectal bleeding 12/05/2015  . Elevated troponin 12/05/2015  . Abdominal distention   . DKA (diabetic ketoacidoses) ([place]) 12/04/2015  . Rash 03/26/2015  . Elevated alkaline phosphatase level 03/26/2015  . NICM (nonischemic cardiomyopathy) ([place]) 08/12/2014  . Chronic diastolic CHF (congestive heart failure) ([place]) 07/07/2014  . NSTEMI (non-ST elevated myocardial infarction) ([place]) 07/07/2014  . Hypertensive heart disease with CHF (congestive heart failure) ([place]) 07/07/2014  . CKD (chronic kidney disease) stage 3, GFR 30-59 ml/min ([facility]) 07/07/2014  . Non compliance w medication regimen secondary to "allergies" and cost 07/07/2014  . PUD (peptic ulcer disease)- endoscopy 2012 07/07/2014  . GI bleed 10/01/2012  . Diverticulitis- lower GI bleed 2013 10/01/2012    Rosalyn Gess OTR/L,CLT 01/14/2020, 4:15 PM  [facility] [address] [place], [address] Phone: [phone]   Fax:  [phone]  Name: [name] MRN: [id number] Date of Birth: 21-Apr-1956

## 2020-01-15 NOTE — Progress Notes (Signed)
PSN spoke with patient today.  Patient informed about the financial assistance that can be provided through the [facility].  Patient does not feel that she needs help currently, but may need some assistance in the near future.  PSN gave patient his office phone number so she could call if issues arose.

## 2020-01-18 ENCOUNTER — Inpatient Hospital Stay: Payer: PPO

## 2020-01-18 ENCOUNTER — Other Ambulatory Visit: Payer: Self-pay

## 2020-01-18 ENCOUNTER — Inpatient Hospital Stay (HOSPITAL_BASED_OUTPATIENT_CLINIC_OR_DEPARTMENT_OTHER): Payer: PPO | Admitting: Oncology

## 2020-01-18 VITALS — BP 104/76 | HR 64 | Temp 97.7°F | Resp 16 | Wt 191.6 lb

## 2020-01-18 DIAGNOSIS — C50812 Malignant neoplasm of overlapping sites of left female breast: Secondary | ICD-10-CM

## 2020-01-18 DIAGNOSIS — Z170 Estrogen receptor positive status [ER+]: Secondary | ICD-10-CM | POA: Diagnosis not present

## 2020-01-18 DIAGNOSIS — R110 Nausea: Secondary | ICD-10-CM

## 2020-01-18 DIAGNOSIS — Z5111 Encounter for antineoplastic chemotherapy: Secondary | ICD-10-CM | POA: Diagnosis not present

## 2020-01-18 DIAGNOSIS — T451X5A Adverse effect of antineoplastic and immunosuppressive drugs, initial encounter: Secondary | ICD-10-CM

## 2020-01-18 DIAGNOSIS — Z95828 Presence of other vascular implants and grafts: Secondary | ICD-10-CM

## 2020-01-18 LAB — COMPREHENSIVE METABOLIC PANEL
ALT: 20 U/L (ref 0–44)
AST: 18 U/L (ref 15–41)
Albumin: 3.4 g/dL — ABNORMAL LOW (ref 3.5–5.0)
Alkaline Phosphatase: 121 U/L (ref 38–126)
Anion gap: 8 (ref 5–15)
BUN: 37 mg/dL — ABNORMAL HIGH (ref 8–23)
CO2: 25 mmol/L (ref 22–32)
Calcium: 8.8 mg/dL — ABNORMAL LOW (ref 8.9–10.3)
Chloride: 104 mmol/L (ref 98–111)
Creatinine, Ser: 1.61 mg/dL — ABNORMAL HIGH (ref 0.44–1.00)
GFR calc Af Amer: 38 mL/min — ABNORMAL LOW (ref >60)
GFR calc non Af Amer: 33 mL/min — ABNORMAL LOW (ref >60)
Glucose, Bld: 127 mg/dL — ABNORMAL HIGH (ref 70–99)
Potassium: 3.9 mmol/L (ref 3.5–5.1)
Sodium: 137 mmol/L (ref 135–145)
Total Bilirubin: 0.5 mg/dL (ref 0.3–1.2)
Total Protein: 6.6 g/dL (ref 6.5–8.1)

## 2020-01-18 LAB — CBC WITH DIFFERENTIAL/PLATELET
Abs Immature Granulocytes: 3.17 10*3/uL — ABNORMAL HIGH (ref 0.00–0.07)
Basophils Absolute: 0 10*3/uL (ref 0.0–0.1)
Basophils Relative: 0 %
Eosinophils Absolute: 0.1 10*3/uL (ref 0.0–0.5)
Eosinophils Relative: 1 %
HCT: 36.4 % (ref 36.0–46.0)
Hemoglobin: 11.1 g/dL — ABNORMAL LOW (ref 12.0–15.0)
Immature Granulocytes: 18 %
Lymphocytes Relative: 8 %
Lymphs Abs: 1.4 10*3/uL (ref 0.7–4.0)
MCH: 26.2 pg (ref 26.0–34.0)
MCHC: 30.5 g/dL (ref 30.0–36.0)
MCV: 86.1 fL (ref 80.0–100.0)
Monocytes Absolute: 1.1 10*3/uL — ABNORMAL HIGH (ref 0.1–1.0)
Monocytes Relative: 6 %
Neutro Abs: 12.2 10*3/uL — ABNORMAL HIGH (ref 1.7–7.7)
Neutrophils Relative %: 67 %
Platelets: 222 10*3/uL (ref 150–400)
RBC: 4.23 MIL/uL (ref 3.87–5.11)
RDW: 17.2 % — ABNORMAL HIGH (ref 11.5–15.5)
WBC: 18.1 10*3/uL — ABNORMAL HIGH (ref 4.0–10.5)
nRBC: 1.2 % — ABNORMAL HIGH (ref 0.0–0.2)

## 2020-01-18 MED ORDER — TRIAMCINOLONE ACETONIDE 0.5 % EX OINT: 1.0000 "application " | TOPICAL_OINTMENT | Freq: Three times a day (TID) | CUTANEOUS | 2 refills | Status: DC

## 2020-01-18 MED ORDER — SODIUM CHLORIDE 0.9% FLUSH
10.0000 mL | Freq: Once | INTRAVENOUS | Status: AC
  Administered 2020-01-18: 10 mL via INTRAVENOUS
  Filled 2020-01-18: qty 10

## 2020-01-18 NOTE — Progress Notes (Signed)
Pt had nausea 12 hours after getting onpro neulasta. She took zofran and then compazine. She drank water and milk. Not much eating. I told her that she should go ahead and take nausea med starting that evening of chemo and go around the clock. Also could use lorazepam under her tongue at night for nausea because it can make her drowsy. Try to eat simple things to swallow-like plain pasta, apple sauce, rice, pudding, jello. Most fruits taste good to most pt. Just make sure the fruits are cleaned by someone else to protect her from [place]. Bacteria on fruits and veg. That are fresh ones. Rash on hand on left and some spots on her back

## 2020-01-21 ENCOUNTER — Encounter: Payer: Self-pay | Admitting: Oncology

## 2020-01-21 NOTE — Progress Notes (Signed)
Hematology/Oncology Consult note [facility]  Telephone:([phone] [phone]  Patient Care Team: [name], MD as PCP - General (Internal Medicine) Minus Breeding, MD as PCP - Cardiology (Cardiology) Jacelyn Pi, MD as Referring Physician (Endocrinology) Lahoma Rocker, MD as Consulting Physician (Rheumatology) Rico Junker, RN as Registered Nurse Theodore Demark, RN as Registered Nurse   Name of the patient: [name]  [id number]  1956/04/26   Date of visit: 01/21/20  Diagnosis-  Invasive lobular carcinoma of the left breast pathological prognostic stage Ib pT2 pN2 acM0 ER/PR positive HER-2/neu negative s/p mastectomy and targeted node dissection  Chief complaint/ Reason for visit-toxicity check after 1 cycle of adjuvant TC chemotherapy  Heme/Onc history: Patient is a 64 year old female with a past medical history significant for hypertension, CKD, diabetes, nonischemic cardiomyopathy among other medical problems. She has not undergone a mammogram for several years. Mammogram on October 2020 was done after she had a palpable area of concern in the left breast which showed an irregular mass with increased vascularity measuring 3.1 x 2.4 x 4.5 cm. The nodularity extended towards the nipple measuring at least 5.4 cm. Single abnormal lymph node in the left axilla with cortical thickness of 0.7 cm. 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  PET scan showed hypermetabolic left axillary lymph nodes which was reviewed at tumor board and they wereat least found to be 3-4. Retroareolar left breast mass 2.5 x 3.2 cm in size. No evidence of distant metastatic disease. There were small to borderline enlarged retroperitoneal lymph nodes 10 mm in size with an SUV of 4.3.Retroperitoneal  lymph nodes will require follow-up in the future  MRI showed 2 sites of biopsy-proven malignancy in the lateral left breast at posterior and anterior depth. There is non-mass enhancement spanning between the 2 sites. Morphologically abnormal level 1 axillary lymph node consistent with biopsy-proven metastatic disease.  Final pathology showed 2 separate tumors one which was 4.5 cm and the other one was 4.4 cm in size. Grade 2 ER/PR positive HER-2/neu negative with negative margins. 5 out of 11 lymph nodes were positive for malignancy. Extranodal extension present. Largest size of metastatic deposit 14 mm. mpT2 mpN2  Patient is not a candidate for anthracycline-based chemotherapy per cardiology.  Adjuvant TC chemotherapy started on 01/03/2020   Interval history-she tolerated chemotherapy well.  She did have significant nausea which lasted for about 5 days post chemotherapy.  Today she feels better.  She is keeping up with her oral intake and drinking plenty of fluids as well.  Has chronic fatigue but denies other complaints  ECOG PS- 1 Pain scale- 0   Review of systems- Review of Systems  Constitutional: Positive for malaise/fatigue. Negative for chills, fever and weight loss.  HENT: Negative for congestion, ear discharge and nosebleeds.   Eyes: Negative for blurred vision.  Respiratory: Negative for cough, hemoptysis, sputum production, shortness of breath and wheezing.   Cardiovascular: Negative for chest pain, palpitations, orthopnea and claudication.  Gastrointestinal: Positive for nausea. Negative for abdominal pain, blood in stool, constipation, diarrhea, heartburn, melena and vomiting.  Genitourinary: Negative for dysuria, flank pain, frequency, hematuria and urgency.  Musculoskeletal: Negative for back pain, joint pain and myalgias.  Skin: Negative for rash.  Neurological: Negative for dizziness, tingling, focal weakness, seizures, weakness and headaches.    Endo/Heme/Allergies: Does  not bruise/bleed easily.  Psychiatric/Behavioral: Negative for depression and suicidal ideas. The patient does not have insomnia.       Allergies  Allergen Reactions  . Hydralazine Swelling    Eye Swelling  . Other Other (See Comments)    Unknown blood pressure medication caused face swelling     Past Medical History:  Diagnosis Date  . Arthritis of both feet    Gout  . Cancer [facility])    Breast Cancer on left  . Chronic combined systolic and diastolic CHF (congestive heart failure) ([place])    a. Echo (06/2014): Severe LVH, EF 20-25%, no RWMA, Gr 3 DD, trivial MR, mild LAE, mild RVE, PASP 38 mmHg .>> b. Echo 2/16 EF 50-55% //  // c. Echo 02/2019: EF 50-55, mod LVH, Gr 1 DD, mild LAE   . CKD (chronic kidney disease), stage III    now stage 4  . Diabetes mellitus type 2 in obese ([place])   . Diverticular disease   . Dyspnea   . Family history of breast cancer   . Family history of leukemia   . Family history of lung cancer   . Family history of testicular cancer   . Family history of throat cancer   . Fatty liver   . History of GI diverticular bleed   . Hyperlipidemia   . Hyperphosphatemia   . Hypertension   . Hypertensive heart disease    a. Renal Artery Duplex (8/15):  no RAS  . Iron deficiency anemia   . NICM (nonischemic cardiomyopathy) ([place])    a. Nuclear (06/2014): EF 32%, apical thinning, no definite scar or ischemia;  b. Echo (2/16):  Mild LVH, EF 50-55%, Gr 2 DD, no RWMA  . Obesity   . PONV (postoperative nausea and vomiting)    after D and C  . Proteinuria   . PUD (peptic ulcer disease)   . Secondary hyperparathyroidism, renal ([place])   . Tobacco abuse   . UTI (lower urinary tract infection)   . Vaginal dryness, menopausal   . Vaginal yeast infection      Past Surgical History:  Procedure Laterality Date  . COLONOSCOPY    . DILATION AND CURETTAGE OF UTERUS    . MASTECTOMY W/ SENTINEL NODE BIOPSY Left 11/12/2019  . MASTECTOMY W/  SENTINEL NODE BIOPSY Left 11/12/2019   Procedure: LEFT MASTECTOMY WITH SENTINEL LYMPH NODE BIOPSY AND TARGETED NODE DISSECTION;  Surgeon: Jovita Kussmaul, MD;  Location: [facility];  Service: General;  Laterality: Left;  . None    . PORTA CATH INSERTION N/A 12/11/2019   Procedure: PORTA CATH INSERTION;  Surgeon: Katha Cabal, MD;  Location: [facility];  Service: Cardiovascular;  Laterality: N/A;    Social History   Socioeconomic History  . Marital status: Widowed    Spouse name: Not on file  . Number of children: 2  . Years of education: 7  . Highest education level: Not on file  Occupational History  . Not on file  Tobacco Use  . Smoking status: Former Smoker    Quit date: 07/05/2014    Years since quitting: 5.5  . Smokeless tobacco: Never Used  Substance and Sexual Activity  . Alcohol use: No  . Drug use: No  . Sexual activity: Not on file  Other Topics Concern  . Not on file  Social History Narrative   Currently lives in a house with her husband.    Fun: Watch TV.   Denies religious beliefs  effecting health care.    Social Determinants of Health   Financial Resource Strain:   . Difficulty of Paying Living Expenses: Not on file  Food Insecurity:   . Worried About Charity fundraiser in the Last Year: Not on file  . Ran Out of Food in the Last Year: Not on file  Transportation Needs:   . Lack of Transportation (Medical): Not on file  . Lack of Transportation (Non-Medical): Not on file  Physical Activity:   . Days of Exercise per Week: Not on file  . Minutes of Exercise per Session: Not on file  Stress:   . Feeling of Stress : Not on file  Social Connections:   . Frequency of Communication with Friends and Family: Not on file  . Frequency of Social Gatherings with Friends and Family: Not on file  . Attends Religious Services: Not on file  . Active Member of Clubs or Organizations: Not on file  . Attends Archivist Meetings: Not on file  .  Marital Status: Not on file  Intimate Partner Violence:   . Fear of Current or Ex-Partner: Not on file  . Emotionally Abused: Not on file  . Physically Abused: Not on file  . Sexually Abused: Not on file    Family History  Problem Relation Age of Onset  . Heart attack Paternal Grandfather   . Stroke Paternal Grandmother   . Hypertension Paternal Grandmother   . Kidney disease Mother   . Diabetes Mother   . Testicular cancer Father        diagnosed older than 85  . Coronary artery disease Other   . Hypertension Other   . Breast cancer Sister        diagnosed late 27s, negative genetics  . Leukemia Brother        chronic  . Throat cancer Brother   . Pneumonia Sister 2  . Lung cancer Paternal Uncle        diagnosed over 62     Current Outpatient Medications:  .  acetaminophen (TYLENOL) 500 MG tablet, Take 1,000 mg by mouth every 6 (six) hours as needed for mild pain, moderate pain or headache. For pain, Disp: , Rfl:  .  allopurinol (ZYLOPRIM) 100 MG tablet, Take 200 mg by mouth daily., Disp: , Rfl:  .  amLODipine (NORVASC) 5 MG tablet, Take 1 tablet (5 mg total) by mouth daily. (Patient taking differently: Take 5 mg by mouth at bedtime. ), Disp: 90 tablet, Rfl: 3 .  aspirin EC 81 MG EC tablet, Take 1 tablet (81 mg total) by mouth daily., Disp: , Rfl:  .  atorvastatin (LIPITOR) 40 MG tablet, Take 1 tablet (40 mg total) by mouth daily at 6 PM., Disp: 90 tablet, Rfl: 3 .  B-D ULTRAFINE III SHORT PEN 31G X 8 MM MISC, USE AS DIRECTED *EMERGENCY FILL*, Disp: , Rfl: 0 .  blood glucose meter kit and supplies KIT, Dispense based on patient and insurance preference. Use up to four times daily as directed. (FOR ICD-9 250.00, 250.01)., Disp: 1 each, Rfl: 0 .  calcitRIOL (ROCALTROL) 0.25 MCG capsule, Take 0.25 mcg by mouth daily., Disp: , Rfl:  .  cloNIDine (CATAPRES) 0.1 MG tablet, Take 1 tablet (0.1 mg total) by mouth 2 (two) times daily., Disp: 180 tablet, Rfl: 3 .  Colchicine 0.6 MG CAPS,  Take 0.6 mg by mouth every other day. , Disp: , Rfl:  .  furosemide (LASIX) 40 MG tablet, Take 40  mg by mouth 2 (two) times daily., Disp: , Rfl:  .  gabapentin (NEURONTIN) 100 MG capsule, Take 100 mg by mouth 3 (three) times daily., Disp: , Rfl:  .  insulin aspart (NOVOLOG FLEXPEN) 100 UNIT/ML FlexPen, Inject 5 Units into the skin 3 (three) times daily with meals. Additional insulin (5 units PLUS) CBG 70 - 120: 0 units CBG 121 - 150: 1 unit CBG 151 - 200: 2 units CBG 201 - 250: 3 units CBG 251 - 350: 4 units CBG 351 - 400: 6 units (Patient taking differently: Inject 5 Units into the skin 3 (three) times daily with meals. Sliding scale  5 units/ plus base), Disp: 15 mL, Rfl: 11 .  insulin glargine (LANTUS) 100 UNIT/ML injection, Inject 20 Units into the skin at bedtime. , Disp: , Rfl:  .  isosorbide mononitrate (IMDUR) 120 MG 24 hr tablet, TAKE 1/2 TABLET DAILY = 60 MG DAILY (Patient taking differently: Take 60 mg by mouth daily. ), Disp: 45 tablet, Rfl: 3 .  lidocaine-prilocaine (EMLA) cream, Apply to affected area once, Disp: 30 g, Rfl: 3 .  loratadine (CLARITIN) 10 MG tablet, Take 10 mg by mouth daily as needed for allergies (around onpro neulasta with treatment)., Disp: , Rfl:  .  LORazepam (ATIVAN) 0.5 MG tablet, Take 1 tablet (0.5 mg total) by mouth every 6 (six) hours as needed (Nausea or vomiting)., Disp: 30 tablet, Rfl: 0 .  metoprolol tartrate (LOPRESSOR) 25 MG tablet, Take 1 tablet (25 mg total) by mouth 2 (two) times daily., Disp: 180 tablet, Rfl: 3 .  ondansetron (ZOFRAN) 8 MG tablet, Take 1 tablet (8 mg total) by mouth 2 (two) times daily as needed for refractory nausea / vomiting. Start on day 3 after chemo., Disp: 30 tablet, Rfl: 1 .  ONE TOUCH ULTRA TEST test strip, 1 each by Other route 4 (four) times daily. , Disp: , Rfl: 1 .  ONETOUCH DELICA LANCETS 09N MISC, Inject 33 g into the skin 4 (four) times daily. , Disp: , Rfl: 1 .  polyethylene glycol (MIRALAX / GLYCOLAX) 17 g packet, Take  17 g by mouth daily as needed for moderate constipation., Disp: , Rfl:  .  prochlorperazine (COMPAZINE) 10 MG tablet, Take 1 tablet (10 mg total) by mouth every 6 (six) hours as needed (Nausea or vomiting)., Disp: 30 tablet, Rfl: 1 .  telmisartan (MICARDIS) 20 MG tablet, Take 1 tablet (20 mg total) by mouth daily. (Patient taking differently: Take 20 mg by mouth at bedtime. ), Disp: 90 tablet, Rfl: 3 .  Menthol, Topical Analgesic, (ICY HOT EX), Apply 1 application topically daily as needed (pain)., Disp: , Rfl:  .  triamcinolone ointment (KENALOG) 0.5 %, Apply 1 application topically 3 (three) times daily., Disp: 30 g, Rfl: 2  Physical exam:  Vitals:   01/18/20 1404  BP: 104/76  Pulse: 64  Resp: 16  Temp: 97.7 F (36.5 C)  TempSrc: Tympanic  Weight: 191 lb 9.6 oz (86.9 kg)   Physical Exam Constitutional:      General: She is not in acute distress. HENT:     Head: Normocephalic and atraumatic.  Eyes:     Pupils: Pupils are equal, round, and reactive to light.  Cardiovascular:     Rate and Rhythm: Normal rate and regular rhythm.     Heart sounds: Normal heart sounds.  Pulmonary:     Effort: Pulmonary effort is normal.     Breath sounds: Normal breath sounds.  Abdominal:  General: Bowel sounds are normal.     Palpations: Abdomen is soft.  Musculoskeletal:     Cervical back: Normal range of motion.  Skin:    General: Skin is warm and dry.  Neurological:     Mental Status: She is alert and oriented to person, place, and time.      CMP Latest Ref Rng & Units 01/18/2020  Glucose 70 - 99 mg/dL 127(H)  BUN 8 - 23 mg/dL 37(H)  Creatinine 0.44 - 1.00 mg/dL 1.61(H)  Sodium 135 - 145 mmol/L 137  Potassium 3.5 - 5.1 mmol/L 3.9  Chloride 98 - 111 mmol/L 104  CO2 22 - 32 mmol/L 25  Calcium 8.9 - 10.3 mg/dL 8.8(L)  Total Protein 6.5 - 8.1 g/dL 6.6  Total Bilirubin 0.3 - 1.2 mg/dL 0.5  Alkaline Phos 38 - 126 U/L 121  AST 15 - 41 U/L 18  ALT 0 - 44 U/L 20   CBC Latest Ref Rng  & Units 01/18/2020  WBC 4.0 - 10.5 K/uL 18.1(H)  Hemoglobin 12.0 - 15.0 g/dL 11.1(L)  Hematocrit 36.0 - 46.0 % 36.4  Platelets 150 - 400 K/uL 222      Assessment and plan- Patient is a 64 y.o. female with pathological prognostic stage Ib invasive lobular carcinoma pT2 pN2 acM0 ER/PR positive HER-2/neu negative s/p left mastectomy and targeted node dissection.    She is here for toxicity check after 1 cycle of adjuvant TC chemotherapy  Patient tolerated chemotherapy well except for chemo-induced nausea.  See plan below.  Leukocytosis today likely secondary to Neulasta.  Overall she is maintaining her oral fluid intake well and does not require any IV fluids today.  Chemo-induced nausea: Patient did not take Decadron before and after chemotherapy due to her brittle diabetes.  She also requested a Decadron premedication to be dropped down.  She is hesitant to try Zyprexa.  She will therefore continue as needed Zofran and Compazine and we will assess how she does after cycle 2.  I will tentatively see her back in 10 days time for cycle 2 of adjuvant TC chemotherapy with on pro Neulasta support   Visit Diagnosis 1. Chemotherapy-induced nausea   2. Malignant neoplasm of overlapping sites of left breast in female, estrogen receptor positive ([place])      Dr. Randa Evens, MD, MPH [facility] [phone] 01/21/2020 9:58 AM

## 2020-01-22 ENCOUNTER — Other Ambulatory Visit: Payer: Self-pay

## 2020-01-22 ENCOUNTER — Ambulatory Visit: Payer: PPO | Attending: Oncology | Admitting: Occupational Therapy

## 2020-01-22 DIAGNOSIS — I972 Postmastectomy lymphedema syndrome: Secondary | ICD-10-CM | POA: Diagnosis not present

## 2020-01-22 NOTE — Patient Instructions (Signed)
Cont same compression and get in touch with Helene Kelp at [facility] to get measured for night time and daytime compression sleeve

## 2020-01-22 NOTE — Therapy (Signed)
[facility] [address] [place], [address] Phone: [phone]   Fax:  [phone]  Occupational Therapy Treatment  Patient Details  Name: [name] MRN: [id number] Date of Birth: 1956/05/13 Referring Provider (OT): Adela Glimpse Date: 01/22/2020  OT End of Session - 01/22/20 9024    Visit Number  2    Number of Visits  8    Date for OT Re-Evaluation  03/10/20    OT Start Time  1046    OT Stop Time  1130    OT Time Calculation (min)  44 min    Activity Tolerance  Patient tolerated treatment well    Behavior During Therapy  [facility] for tasks assessed/performed       Past Medical History:  Diagnosis Date  . Arthritis of both feet    Gout  . Cancer [facility])    Breast Cancer on left  . Chronic combined systolic and diastolic CHF (congestive heart failure) ([place])    a. Echo (06/2014): Severe LVH, EF 20-25%, no RWMA, Gr 3 DD, trivial MR, mild LAE, mild RVE, PASP 38 mmHg .>> b. Echo 2/16 EF 50-55% //  // c. Echo 02/2019: EF 50-55, mod LVH, Gr 1 DD, mild LAE   . CKD (chronic kidney disease), stage III    now stage 4  . Diabetes mellitus type 2 in obese ([place])   . Diverticular disease   . Dyspnea   . Family history of breast cancer   . Family history of leukemia   . Family history of lung cancer   . Family history of testicular cancer   . Family history of throat cancer   . Fatty liver   . History of GI diverticular bleed   . Hyperlipidemia   . Hyperphosphatemia   . Hypertension   . Hypertensive heart disease    a. Renal Artery Duplex (8/15):  no RAS  . Iron deficiency anemia   . NICM (nonischemic cardiomyopathy) ([place])    a. Nuclear (06/2014): EF 32%, apical thinning, no definite scar or ischemia;  b. Echo (2/16):  Mild LVH, EF 50-55%, Gr 2 DD, no RWMA  . Obesity   . PONV (postoperative nausea and vomiting)    after D and C  . Proteinuria   . PUD (peptic ulcer disease)   . Secondary  hyperparathyroidism, renal ([place])   . Tobacco abuse   . UTI (lower urinary tract infection)   . Vaginal dryness, menopausal   . Vaginal yeast infection     Past Surgical History:  Procedure Laterality Date  . COLONOSCOPY    . DILATION AND CURETTAGE OF UTERUS    . MASTECTOMY W/ SENTINEL NODE BIOPSY Left 11/12/2019  . MASTECTOMY W/ SENTINEL NODE BIOPSY Left 11/12/2019   Procedure: LEFT MASTECTOMY WITH SENTINEL LYMPH NODE BIOPSY AND TARGETED NODE DISSECTION;  Surgeon: Jovita Kussmaul, MD;  Location: [place];  Service: General;  Laterality: Left;  . None    . PORTA CATH INSERTION N/A 12/11/2019   Procedure: PORTA CATH INSERTION;  Surgeon: Katha Cabal, MD;  Location: [place] [facility];  Service: Cardiovascular;  Laterality: N/A;    There were no vitals filed for this visit.  Subjective Assessment - 01/22/20 1220    Subjective   My upper arm feels better- not as tight, and ache- I wore the compression about 90% of time - I did see the DR - she gave me some steriod ointment to  use on my skin -and that itchy red areas is better    Pertinent History  Patient is a 64 y.o. 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  No/denies          LYMPHEDEMA/ONCOLOGY QUESTIONNAIRE - 01/22/20 1054      Left Upper Extremity Lymphedema   15 cm Proximal to Olecranon Process  40.5 cm    10 cm Proximal to Olecranon Process  36.6 cm    Olecranon Process  28 cm    15 cm  Proximal to Ulnar Styloid Process  27 cm    10 cm Proximal to Ulnar Styloid Process  23.5 cm    Just Proximal to Ulnar Styloid Process  18.5 cm    Across Hand at [employer]  19.8 cm    At [place] of 2nd Digit  6.8 cm    At [facility] of Thumb  6.8 cm       Measure L UE circumference - decrease in hand , wrist , elbow and upper arm   pt report feeling less heavy ,tight and full - ache in upper arm with compression See flow sheet   pt provided with new   temporary compression to wear while waiting for her compression garments isotoner glove on L hand , soft tubigrip on hand to elbow and tubigrip E from forearm to axilla   Pt report her scar is not as tender and the glue come off - more on R than L - but cont doing it - ask about bra's and prothesis - pt to contact [facility]  And will contact her navigator about knitted knockers - pt is looking for something lighter to use too  Email send to [employer] about measure pt for Jobst Elvarex soft sleeve and glove - with wide band  And Jubilee night time sleeve with power sleeve  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 - 01/22/20 1221    Education Details  progress, HEP , POC    Person(s) Educated  Patient    Methods  Explanation;Demonstration;Tactile cues;Verbal cues;Handout    Comprehension  Verbal cues required;Returned demonstration;Verbalized understanding          OT Long Term Goals - 01/14/20 1610      OT LONG TERM GOAL #1   Title  L UE circumference decrease by at least 1 cm at wrist , forearm and upper arm to get measured for custom copmression sleeve and glove    Baseline  L wrist increase by 1.8 cm , forearm 2 cm , upper arm by 2.5 cm - no compression    Time  4    Period  Weeks    Status  New    Target Date  02/11/20  OT LONG TERM GOAL #2   Title  Pt and family to be independent in wearing of compression garments to decrease circumfernce and maintain  lymphedema in L UE    Baseline  no knowledge on garments    Time  8    Period  Weeks    Status  New    Target Date  03/10/20      OT LONG TERM GOAL #3   Title  Pt and son be independent in doing HEP to increase activity tolerance and strengthening without increase lympphedema    Baseline  did not do any exercises for L UE - scar on L chest still have glue on - pt do not want to touch or have anybody look at it - no scar adhesion - pt to wash chest    Time  5    Period  Weeks    Status  New    Target Date  02/18/20            Plan - 01/22/20 1222    Clinical Impression Statement  Pt arrive with her temporary compression on - her L UE circumference did decrease in hand , wrist and elbow and upper arm - pt to contact [facility] to get measure for daytime compression sleeve - Jobst Elvarex soft sleeve and glove , and night time Jubilee with powersleeve.Pt will follow up in 2 wks wtih me again    OT Occupational Profile and History  Problem Focused Assessment - Including review of records relating to presenting problem    Occupational performance deficits (Please refer to evaluation for details):  ADL's;IADL's;Play;Leisure    Body Structure / Function / Physical Skills  UE functional use;Edema    Rehab Potential  Good    Clinical Decision Making  Limited treatment options, no task modification necessary    Comorbidities Affecting Occupational Performance:  May have comorbidities impacting occupational performance    Modification or Assistance to Complete Evaluation   No modification of tasks or assist necessary to complete eval    OT Frequency  1x / week    OT Duration  --   7 wks   OT Treatment/Interventions  Self-care/ADL training;Manual lymph drainage;Patient/family education;Compression bandaging;Scar mobilization;Manual Therapy;Therapeutic exercise    Plan  assess circumference in 2 wks while waiting for compression sleeve -assess scar    OT Home Exercise Plan  see pt instruction     Consulted and Agree with Plan of Care  Patient       Patient will benefit from skilled therapeutic intervention in order to improve the following deficits and impairments:   Body Structure / Function / Physical Skills: UE functional use, Edema       Visit Diagnosis: Postmastectomy lymphedema syndrome    Problem List Patient Active Problem List   Diagnosis Date Noted  . Cancer of left female breast  ([place]) 11/12/2019  . Goals of care, counseling/discussion 10/14/2019  . Family history of breast cancer   . Family history of testicular cancer   . Family history of lung cancer   . Family history of throat cancer   . Family history of leukemia   . Malignant neoplasm of overlapping sites of left breast in female, estrogen receptor positive ([place]) 10/09/2019  . Pain in right foot 10/24/2018  . Type 2 diabetes mellitus with stage 3 chronic kidney disease, with long-term current use of insulin ([place]) 01/31/2018  . Pancreatitis, acute 12/06/2015  . ARF (acute renal failure) ([place]) 12/05/2015  . Rectal  bleeding 12/05/2015  . Elevated troponin 12/05/2015  . Abdominal distention   . DKA (diabetic ketoacidoses) ([place]) 12/04/2015  . Rash 03/26/2015  . Elevated alkaline phosphatase level 03/26/2015  . NICM (nonischemic cardiomyopathy) ([place]) 08/12/2014  . Chronic diastolic CHF (congestive heart failure) ([place]) 07/07/2014  . NSTEMI (non-ST elevated myocardial infarction) ([place]) 07/07/2014  . Hypertensive heart disease with CHF (congestive heart failure) ([place]) 07/07/2014  . CKD (chronic kidney disease) stage 3, GFR 30-59 ml/min (HCC) 07/07/2014  . Non compliance w medication regimen secondary to "allergies" and cost 07/07/2014  . PUD (peptic ulcer disease)- endoscopy 2012 07/07/2014  . GI bleed 10/01/2012  . Diverticulitis- lower GI bleed 2013 10/01/2012    Rosalyn Gess OTR/L,CLT 01/22/2020, 12:26 PM  [place] [facility] [address] [place], [address] Phone: [phone]   Fax:  [phone]  Name: [name] MRN: [id number] Date of Birth: [birth year]

## 2020-01-23 ENCOUNTER — Telehealth: Payer: Self-pay | Admitting: *Deleted

## 2020-01-23 NOTE — Telephone Encounter (Signed)
Called to check with patient as to how she feels this week from getting chemo 1st time. Last week when she came to office she had a rash and nauseated and food taste awful. This week she states the cream for rash is better and does not bother her. Also she is not nauseated now. Her appetite much better and she can taste food. She did loose all her hair and she has a toboggan, a scarf and wig to choose from to wear. She was thankful for the call to check on her

## 2020-01-24 ENCOUNTER — Telehealth: Payer: Self-pay

## 2020-01-24 ENCOUNTER — Telehealth: Payer: Self-pay | Admitting: *Deleted

## 2020-01-24 NOTE — Telephone Encounter (Signed)
Called pt to let her know that we got a call from [place] today that pt needs lymphedema wear. Ihave faxed it to [facility] and pt has appt tom with them for measuring

## 2020-01-24 NOTE — Telephone Encounter (Signed)
Nutrition Assessment   Reason for Assessment: Referral from [facility], taste alterations   ASSESSMENT:  64 year old female with invasive lobular breast carcinoma (left).  Patient s/p mastectomy and receiving adjuvant chemotherapy. Past medical history of CHF, CKD, DM, HTN, PUD, cardiomyopathy.    Spoke with patient via phone.  Patient reports that about 5 days after chemotherapy had awful taste in her mouth, metallic taste.  Has been using plastic utensils. Also reports nausea as well.  Has been instructed on how to take nausea medications for 2nd round of treatment.      Medications: zofran, compazine, ativan   Labs: reviewed   Anthropometrics:   Height: 61 inches Weight: 191 lb 9.6 oz on 1/29 12/22 188 lb BMI: 36  No weight loss   NUTRITION DIAGNOSIS: Inadequate oral intake related to cancer related treatment side effects as evidenced by taste alterations, nausea, poor po intake   INTERVENTION:  Discussed strategies to help with taste change.  Will mail handout. Discussed strategies to help with nausea as well.  Will mail handout.   Contact information included as well.    MONITORING, EVALUATION, GOAL: Patient will consume adequate calories and protein to maintain lean muscle mass during treatment   Next Visit: phone f/u Feb 25  Ladarrian Asencio B. Zenia Resides, [place], [place] Registered Dietitian [phone] (pager)

## 2020-01-25 DIAGNOSIS — Z4432 Encounter for fitting and adjustment of external left breast prosthesis: Secondary | ICD-10-CM | POA: Diagnosis not present

## 2020-01-25 DIAGNOSIS — C50112 Malignant neoplasm of central portion of left female breast: Secondary | ICD-10-CM | POA: Diagnosis not present

## 2020-01-28 NOTE — Progress Notes (Signed)
Cardiology Office Note:    Date:  01/29/2020   ID:  [name], DOB [birth year], MRN [id number]  PCP:  [name], MD  Cardiologist:  Minus Breeding, MD / Richardson Dopp, PA-C  Electrophysiologist:  None  Nephrologist: Dr. [name] Oncologist:  Dr. Randa [facility])  Referring MD: [name], MD   Chief Complaint:  Follow-up (HTN, CHF)    Patient Profile:    [name] is a 64 y.o. female with:   Hypertension   RA ultrasound 07/2014: neg for RA stenosis   Combined Systolic and Diastolic CHF  Non-ischemic cardiomyopathy   Admx in 06/2014 w/ acute HF in setting of HTN emergency   Echocardiogram (06/2014): EF 20-25  Echocardiogram 2016: EF 50-55, Gr 2 DD  Echocardiogram 02/2019: EF 50-55, Gr 1 DD  True allergy to Hydralazine   Intol to Carvedilol   Myoview 7/15:  No scar, No ischemia   Chronic kidney disease   Diabetes mellitus   Breast CA  S/p L Mastectomy 10/2019  Chemotherapy >> Taxotere, Cytoxan, Aloxi, Fulphila  Prior CV studies: Echo 02/26/2019 EF 50-55, mod LVH, Gr 1 DD, normal RVSF, mild LAE  Echo (01/27/15):  Mild LVH, EF 50-55%, normal wall motion, grade 2 diastolic dysfunction  Echo (06/2014): Severe LVH, EF 20-25%, no RWMA, Gr 3 DD, trivial MR, mild LAE, mild RVE, PASP 38 mmHg.  Nuclear (06/2014): EF 32%, apical thinning, no definite scar or ischemia   History of Present Illness:    [name] was last seen by me in 04/2019 via Telemedicine.  She has since been dx with L breast CA.  She underwent L mastectomy and has been undergoing adjuvant chemotherapy treatment.    She returns for follow up.  She is here alone.  She has had a rough year.  Her husband passed away suddenly prior to Christmas.  She is mid way through her chemotherapy now.  She has 6 weeks to go, then she has to undergo radiation.  She feels sick for about 5 days around her chemotherapy.  She has no appetite and is nauseated.  She had chemotherapy today.  She has not had  chest pain, significant shortness of breath, syncope, orthopnea.  She has some leg swelling.  Her weight is up and down.     Past Medical History:  Diagnosis Date   Arthritis of both feet    Gout   Cancer ([place])    Breast Cancer on left   Chronic combined systolic and diastolic CHF (congestive heart failure) ([place])    a. Echo (06/2014): Severe LVH, EF 20-25%, no RWMA, Gr 3 DD, trivial MR, mild LAE, mild RVE, PASP 38 mmHg .>> b. Echo 2/16 EF 50-55% //  // c. Echo 02/2019: EF 50-55, mod LVH, Gr 1 DD, mild LAE    CKD (chronic kidney disease), stage III    now stage 4   Diabetes mellitus type 2 in obese [facility])    Diverticular disease    Dyspnea    Family history of breast cancer    Family history of leukemia    Family history of lung cancer    Family history of testicular cancer    Family history of throat cancer    Fatty liver    History of GI diverticular bleed    Hyperlipidemia    Hyperphosphatemia    Hypertension    Hypertensive heart disease    a. Renal Artery Duplex (8/15):  no RAS   Iron deficiency anemia  NICM (nonischemic cardiomyopathy) ([place])    a. Nuclear (06/2014): EF 32%, apical thinning, no definite scar or ischemia;  b. Echo (2/16):  Mild LVH, EF 50-55%, Gr 2 DD, no RWMA   Obesity    PONV (postoperative nausea and vomiting)    after D and C   Proteinuria    PUD (peptic ulcer disease)    Secondary hyperparathyroidism, renal (HCC)    Tobacco abuse    UTI (lower urinary tract infection)    Vaginal dryness, menopausal    Vaginal yeast infection     Current Medications: Current Meds  Medication Sig   acetaminophen (TYLENOL) 500 MG tablet Take 1,000 mg by mouth every 6 (six) hours as needed for mild pain, moderate pain or headache. For pain   allopurinol (ZYLOPRIM) 100 MG tablet Take 200 mg by mouth daily.   amLODipine (NORVASC) 5 MG tablet Take 1 tablet (5 mg total) by mouth daily. (Patient taking differently: Take 5 mg by mouth at  bedtime. )   aspirin EC 81 MG EC tablet Take 1 tablet (81 mg total) by mouth daily.   atorvastatin (LIPITOR) 40 MG tablet Take 1 tablet (40 mg total) by mouth daily at 6 PM.   B-D ULTRAFINE III SHORT PEN 31G X 8 MM MISC USE AS DIRECTED *EMERGENCY FILL*   blood glucose meter kit and supplies KIT Dispense based on patient and insurance preference. Use up to four times daily as directed. (FOR ICD-9 250.00, 250.01).   calcitRIOL (ROCALTROL) 0.25 MCG capsule Take 0.25 mcg by mouth daily.   cloNIDine (CATAPRES) 0.1 MG tablet Take 1 tablet (0.1 mg total) by mouth 2 (two) times daily.   Colchicine 0.6 MG CAPS Take 0.6 mg by mouth every other day.    furosemide (LASIX) 40 MG tablet Take 40 mg by mouth 2 (two) times daily.   gabapentin (NEURONTIN) 100 MG capsule Take 100 mg by mouth 3 (three) times daily.   insulin aspart (NOVOLOG FLEXPEN) 100 UNIT/ML FlexPen Inject 5 Units into the skin 3 (three) times daily with meals. Additional insulin (5 units PLUS) CBG 70 - 120: 0 units CBG 121 - 150: 1 unit CBG 151 - 200: 2 units CBG 201 - 250: 3 units CBG 251 - 350: 4 units CBG 351 - 400: 6 units (Patient taking differently: Inject 5 Units into the skin 3 (three) times daily with meals. Sliding scale  5 units/ plus base)   insulin glargine (LANTUS) 100 UNIT/ML injection Inject 20 Units into the skin at bedtime.    isosorbide mononitrate (IMDUR) 120 MG 24 hr tablet TAKE 1/2 TABLET DAILY = 60 MG DAILY (Patient taking differently: Take 60 mg by mouth daily. )   lidocaine-prilocaine (EMLA) cream Apply to affected area once   loratadine (CLARITIN) 10 MG tablet Take 10 mg by mouth daily as needed for allergies (around onpro neulasta with treatment).   LORazepam (ATIVAN) 0.5 MG tablet Take 1 tablet (0.5 mg total) by mouth every 6 (six) hours as needed (Nausea or vomiting).   Menthol, Topical Analgesic, (ICY HOT EX) Apply 1 application topically daily as needed (pain).   metoprolol tartrate (LOPRESSOR)  25 MG tablet Take 1 tablet (25 mg total) by mouth 2 (two) times daily.   ondansetron (ZOFRAN) 8 MG tablet Take 1 tablet (8 mg total) by mouth 2 (two) times daily as needed for refractory nausea / vomiting. Start on day 3 after chemo.   ONE TOUCH ULTRA TEST test strip 1 each by Other route 4 (  four) times daily.    ONETOUCH DELICA LANCETS 01B MISC Inject 33 g into the skin 4 (four) times daily.    polyethylene glycol (MIRALAX / GLYCOLAX) 17 g packet Take 17 g by mouth daily as needed for moderate constipation.   prochlorperazine (COMPAZINE) 10 MG tablet Take 1 tablet (10 mg total) by mouth every 6 (six) hours as needed (Nausea or vomiting).   telmisartan (MICARDIS) 20 MG tablet Take 1 tablet (20 mg total) by mouth daily. (Patient taking differently: Take 20 mg by mouth at bedtime. )   triamcinolone ointment (KENALOG) 0.5 % Apply 1 application topically 3 (three) times daily.     Allergies:   Hydralazine and Other   Social History   Tobacco Use   Smoking status: Former Smoker    Quit date: 07/05/2014    Years since quitting: 5.5   Smokeless tobacco: Never Used  Substance Use Topics   Alcohol use: No   Drug use: No     Family Hx: The patient's family history includes Breast cancer in her sister; Coronary artery disease in an other family member; Diabetes in her mother; Heart attack in her paternal grandfather; Hypertension in her paternal grandmother and another family member; Kidney disease in her mother; Leukemia in her brother; Lung cancer in her paternal uncle; Pneumonia (age of onset: 2) in her sister; Stroke in her paternal grandmother; Testicular cancer in her father; Throat cancer in her brother.  ROS   EKGs/Labs/Other Test Reviewed:    EKG:  EKG is not ordered today.  The ekg ordered today demonstrates n/a  Recent Labs: 01/29/2020: ALT 21; BUN 56; Creatinine, Ser 1.45; Hemoglobin 11.0; Platelets 266; Potassium 3.9; Sodium 135   Recent Lipid Panel Lab Results    Component Value Date/Time   CHOL 112 01/31/2017 07:51 AM   TRIG 104 01/31/2017 07:51 AM   HDL 34 (L) 01/31/2017 07:51 AM   CHOLHDL 3.3 01/31/2017 07:51 AM   CHOLHDL 3.2 12/07/2015 04:49 AM   LDLCALC 57 01/31/2017 07:51 AM    Physical Exam:    VS:  BP 118/70    Pulse 83    Ht 5' (1.524 m)    Wt 195 lb (88.5 kg)    SpO2 94%    BMI 38.08 kg/m     Wt Readings from Last 3 Encounters:  01/29/20 195 lb (88.5 kg)  01/29/20 193 lb 4.8 oz (87.7 kg)  01/18/20 191 lb 9.6 oz (86.9 kg)     Constitutional:      Appearance: Healthy appearance. Not in distress.  Neck:     Vascular: JVD normal.  Pulmonary:     Effort: Pulmonary effort is normal.     Breath sounds: No wheezing. No rales.  Cardiovascular:     Normal rate. Regular rhythm.     Murmurs: There is no murmur.     No gallop.  Edema:    Peripheral edema absent.  Abdominal:     Palpations: Abdomen is soft.  Skin:    General: Skin is warm and dry.  Neurological:     General: No focal deficit present.     Mental Status: Alert and oriented to person, place and time.       ASSESSMENT & PLAN:    1. Chronic heart failure with preserved ejection fraction (HCC) EF was 20-25 and is now 50-55 by echocardiogram in March 2020.  She is not on any anthracyclines for her breast CA.  She is on cyclophosphamide which has some cardiotoxicity associated  with it.  Since she has a hx of reduced EF, it would be worthwhile to update her echocardiogram to keep an eye on her EF.  Her BUN today was elevated.  I suspect she is dehydrated around her chemotherapy sessions.    -Arrange 2D echocardiogram next month  -Reduce Furosemide to 40 mg once daily for the 5-6 days around her chemo   -Then resume Furosemide twice daily   -Continue current dose of beta-blocker, angiotensin receptor blocker, nitrates.  2. Hypertensive heart disease with chronic combined systolic and diastolic congestive heart failure ([place]) The patient's blood pressure is controlled  on her current regimen.  Continue current therapy with Amlodipine, clonidine, isosorbide, metoprolol, telmisartan.  She has noted some low BPs but has not been symptomatic.    3. Stage 3a chronic kidney disease Creatinine has been stable.  She is followed by Nephrology (Dr. [name]).    4. Malignant neoplasm of overlapping sites of left breast in female, estrogen receptor positive ([place]) She is s/p L mastectomy and is now on chemotherapy.  She will then start radiation.   She is followed by Oncology at [facility].      Dispo:  Return in about 6 months (around 07/28/2020) for Routine Follow Up, w/ Richardson Dopp, PA-C, via Telemedicine.   Medication Adjustments/Labs and Tests Ordered: Current medicines are reviewed at length with the patient today.  Concerns regarding medicines are outlined above.  Tests Ordered: Orders Placed This Encounter  Procedures   ECHOCARDIOGRAM COMPLETE   Medication Changes: No orders of the defined types were placed in this encounter.   Signed, Richardson Dopp, PA-C  01/29/2020 3:59 PM    [facility] [place], [place], [place]  [address] Phone: [phone]; Fax: [phone]

## 2020-01-29 ENCOUNTER — Encounter: Payer: Self-pay | Admitting: Oncology

## 2020-01-29 ENCOUNTER — Other Ambulatory Visit: Payer: Self-pay

## 2020-01-29 ENCOUNTER — Inpatient Hospital Stay: Payer: PPO

## 2020-01-29 ENCOUNTER — Ambulatory Visit: Payer: PPO | Admitting: Physician Assistant

## 2020-01-29 ENCOUNTER — Inpatient Hospital Stay (HOSPITAL_BASED_OUTPATIENT_CLINIC_OR_DEPARTMENT_OTHER): Payer: PPO | Admitting: Oncology

## 2020-01-29 ENCOUNTER — Inpatient Hospital Stay: Payer: PPO | Attending: Oncology

## 2020-01-29 ENCOUNTER — Encounter: Payer: Self-pay | Admitting: Physician Assistant

## 2020-01-29 VITALS — BP 111/66 | HR 68 | Temp 97.1°F | Wt 193.3 lb

## 2020-01-29 VITALS — Resp 20

## 2020-01-29 VITALS — BP 118/70 | HR 83 | Ht 60.0 in | Wt 195.0 lb

## 2020-01-29 DIAGNOSIS — Z5111 Encounter for antineoplastic chemotherapy: Secondary | ICD-10-CM | POA: Diagnosis not present

## 2020-01-29 DIAGNOSIS — R590 Localized enlarged lymph nodes: Secondary | ICD-10-CM | POA: Diagnosis not present

## 2020-01-29 DIAGNOSIS — Z8249 Family history of ischemic heart disease and other diseases of the circulatory system: Secondary | ICD-10-CM | POA: Insufficient documentation

## 2020-01-29 DIAGNOSIS — C50812 Malignant neoplasm of overlapping sites of left female breast: Secondary | ICD-10-CM

## 2020-01-29 DIAGNOSIS — Z79899 Other long term (current) drug therapy: Secondary | ICD-10-CM | POA: Insufficient documentation

## 2020-01-29 DIAGNOSIS — Z9012 Acquired absence of left breast and nipple: Secondary | ICD-10-CM | POA: Diagnosis not present

## 2020-01-29 DIAGNOSIS — N183 Chronic kidney disease, stage 3 unspecified: Secondary | ICD-10-CM | POA: Insufficient documentation

## 2020-01-29 DIAGNOSIS — I5042 Chronic combined systolic (congestive) and diastolic (congestive) heart failure: Secondary | ICD-10-CM

## 2020-01-29 DIAGNOSIS — Z806 Family history of leukemia: Secondary | ICD-10-CM | POA: Insufficient documentation

## 2020-01-29 DIAGNOSIS — Z8043 Family history of malignant neoplasm of testis: Secondary | ICD-10-CM | POA: Insufficient documentation

## 2020-01-29 DIAGNOSIS — I130 Hypertensive heart and chronic kidney disease with heart failure and stage 1 through stage 4 chronic kidney disease, or unspecified chronic kidney disease: Secondary | ICD-10-CM | POA: Diagnosis not present

## 2020-01-29 DIAGNOSIS — Z170 Estrogen receptor positive status [ER+]: Secondary | ICD-10-CM

## 2020-01-29 DIAGNOSIS — Z794 Long term (current) use of insulin: Secondary | ICD-10-CM | POA: Diagnosis not present

## 2020-01-29 DIAGNOSIS — Z87891 Personal history of nicotine dependence: Secondary | ICD-10-CM | POA: Insufficient documentation

## 2020-01-29 DIAGNOSIS — Z801 Family history of malignant neoplasm of trachea, bronchus and lung: Secondary | ICD-10-CM | POA: Diagnosis not present

## 2020-01-29 DIAGNOSIS — I110 Hypertensive heart disease with heart failure: Secondary | ICD-10-CM | POA: Diagnosis not present

## 2020-01-29 DIAGNOSIS — I5032 Chronic diastolic (congestive) heart failure: Secondary | ICD-10-CM | POA: Diagnosis not present

## 2020-01-29 DIAGNOSIS — T451X5A Adverse effect of antineoplastic and immunosuppressive drugs, initial encounter: Secondary | ICD-10-CM | POA: Diagnosis not present

## 2020-01-29 DIAGNOSIS — N189 Chronic kidney disease, unspecified: Secondary | ICD-10-CM

## 2020-01-29 DIAGNOSIS — N1831 Chronic kidney disease, stage 3a: Secondary | ICD-10-CM

## 2020-01-29 DIAGNOSIS — Z833 Family history of diabetes mellitus: Secondary | ICD-10-CM | POA: Diagnosis not present

## 2020-01-29 DIAGNOSIS — D6481 Anemia due to antineoplastic chemotherapy: Secondary | ICD-10-CM

## 2020-01-29 DIAGNOSIS — Z7982 Long term (current) use of aspirin: Secondary | ICD-10-CM | POA: Diagnosis not present

## 2020-01-29 DIAGNOSIS — Z8349 Family history of other endocrine, nutritional and metabolic diseases: Secondary | ICD-10-CM | POA: Diagnosis not present

## 2020-01-29 DIAGNOSIS — E1122 Type 2 diabetes mellitus with diabetic chronic kidney disease: Secondary | ICD-10-CM | POA: Diagnosis not present

## 2020-01-29 DIAGNOSIS — Z5189 Encounter for other specified aftercare: Secondary | ICD-10-CM | POA: Diagnosis not present

## 2020-01-29 DIAGNOSIS — Z803 Family history of malignant neoplasm of breast: Secondary | ICD-10-CM | POA: Diagnosis not present

## 2020-01-29 LAB — COMPREHENSIVE METABOLIC PANEL WITH GFR
ALT: 21 U/L (ref 0–44)
AST: 18 U/L (ref 15–41)
Albumin: 3.7 g/dL (ref 3.5–5.0)
Alkaline Phosphatase: 100 U/L (ref 38–126)
Anion gap: 10 (ref 5–15)
BUN: 56 mg/dL — ABNORMAL HIGH (ref 8–23)
CO2: 24 mmol/L (ref 22–32)
Calcium: 8.8 mg/dL — ABNORMAL LOW (ref 8.9–10.3)
Chloride: 101 mmol/L (ref 98–111)
Creatinine, Ser: 1.45 mg/dL — ABNORMAL HIGH (ref 0.44–1.00)
GFR calc Af Amer: 44 mL/min — ABNORMAL LOW
GFR calc non Af Amer: 38 mL/min — ABNORMAL LOW
Glucose, Bld: 207 mg/dL — ABNORMAL HIGH (ref 70–99)
Potassium: 3.9 mmol/L (ref 3.5–5.1)
Sodium: 135 mmol/L (ref 135–145)
Total Bilirubin: 0.7 mg/dL (ref 0.3–1.2)
Total Protein: 6.9 g/dL (ref 6.5–8.1)

## 2020-01-29 LAB — CBC WITH DIFFERENTIAL/PLATELET
Abs Immature Granulocytes: 0.04 10*3/uL (ref 0.00–0.07)
Basophils Absolute: 0.1 10*3/uL (ref 0.0–0.1)
Basophils Relative: 2 %
Eosinophils Absolute: 0.1 10*3/uL (ref 0.0–0.5)
Eosinophils Relative: 1 %
HCT: 35.2 % — ABNORMAL LOW (ref 36.0–46.0)
Hemoglobin: 11 g/dL — ABNORMAL LOW (ref 12.0–15.0)
Immature Granulocytes: 1 %
Lymphocytes Relative: 8 %
Lymphs Abs: 0.7 10*3/uL (ref 0.7–4.0)
MCH: 27 pg (ref 26.0–34.0)
MCHC: 31.3 g/dL (ref 30.0–36.0)
MCV: 86.5 fL (ref 80.0–100.0)
Monocytes Absolute: 0.7 10*3/uL (ref 0.1–1.0)
Monocytes Relative: 8 %
Neutro Abs: 6.7 10*3/uL (ref 1.7–7.7)
Neutrophils Relative %: 80 %
Platelets: 266 10*3/uL (ref 150–400)
RBC: 4.07 MIL/uL (ref 3.87–5.11)
RDW: 18.6 % — ABNORMAL HIGH (ref 11.5–15.5)
WBC: 8.3 10*3/uL (ref 4.0–10.5)
nRBC: 0 % (ref 0.0–0.2)

## 2020-01-29 MED ORDER — SODIUM CHLORIDE 0.9 % IV SOLN
75.0000 mg/m2 | Freq: Once | INTRAVENOUS | Status: AC
  Administered 2020-01-29: 140 mg via INTRAVENOUS
  Filled 2020-01-29: qty 14

## 2020-01-29 MED ORDER — SODIUM CHLORIDE 0.9 % IV SOLN
600.0000 mg/m2 | Freq: Once | INTRAVENOUS | Status: AC
  Administered 2020-01-29: 1160 mg via INTRAVENOUS
  Filled 2020-01-29: qty 58

## 2020-01-29 MED ORDER — SODIUM CHLORIDE 0.9 % IV SOLN
Freq: Once | INTRAVENOUS | Status: AC
  Filled 2020-01-29: qty 250

## 2020-01-29 MED ORDER — HEPARIN SOD (PORK) LOCK FLUSH 100 UNIT/ML IV SOLN
500.0000 [IU] | Freq: Once | INTRAVENOUS | Status: AC
  Administered 2020-01-29: 500 [IU] via INTRAVENOUS
  Filled 2020-01-29: qty 5

## 2020-01-29 MED ORDER — DEXAMETHASONE SODIUM PHOSPHATE 10 MG/ML IJ SOLN
4.0000 mg | Freq: Once | INTRAMUSCULAR | Status: AC
  Administered 2020-01-29: 4 mg via INTRAVENOUS
  Filled 2020-01-29: qty 1

## 2020-01-29 MED ORDER — PEGFILGRASTIM 6 MG/0.6ML ~~LOC~~ PSKT
6.0000 mg | PREFILLED_SYRINGE | Freq: Once | SUBCUTANEOUS | Status: AC
  Administered 2020-01-29: 6 mg via SUBCUTANEOUS
  Filled 2020-01-29: qty 0.6

## 2020-01-29 MED ORDER — HEPARIN SOD (PORK) LOCK FLUSH 100 UNIT/ML IV SOLN
INTRAVENOUS | Status: AC
  Filled 2020-01-29: qty 5

## 2020-01-29 MED ORDER — SODIUM CHLORIDE 0.9% FLUSH
10.0000 mL | Freq: Once | INTRAVENOUS | Status: AC
  Administered 2020-01-29: 10 mL via INTRAVENOUS
  Filled 2020-01-29: qty 10

## 2020-01-29 MED ORDER — PALONOSETRON HCL INJECTION 0.25 MG/5ML
0.2500 mg | Freq: Once | INTRAVENOUS | Status: AC
  Administered 2020-01-29: 0.25 mg via INTRAVENOUS
  Filled 2020-01-29: qty 5

## 2020-01-29 MED ORDER — SODIUM CHLORIDE 0.9 % IV SOLN: 4.0000 mg | Freq: Once | INTRAVENOUS | Status: DC

## 2020-01-29 NOTE — Patient Instructions (Signed)
Medication Instructions:   Your physician recommends that you continue on your current medications as directed. Please refer to the Current Medication list given to you today.  *If you need a refill on your cardiac medications before your next appointment, please call your pharmacy*  Lab Work:  None ordered today  If you have labs (blood work) drawn today and your tests are completely normal, you will receive your results only by: Marland Kitchen MyChart Message (if you have MyChart) OR . A paper copy in the mail If you have any lab test that is abnormal or we need to change your treatment, we will call you to review the results.  Testing/Procedures:  Your physician has requested that you have an echocardiogram in March. Echocardiography is a painless test that uses sound waves to create images of your heart. It provides your doctor with information about the size and shape of your heart and how well your heart's chambers and valves are working. This procedure takes approximately one hour. There are no restrictions for this procedure.   Follow-Up: At [facility], you and your health needs are our priority.  As part of our continuing mission to provide you with exceptional heart care, we have created designated Provider Care Teams.  These Care Teams include your primary Cardiologist (physician) and Advanced Practice Providers (APPs -  Physician Assistants and Nurse Practitioners) who all work together to provide you with the care you need, when you need it.  Your next appointment:   6 month(s)  The format for your next appointment:   Virtual Visit   Provider:   Richardson Dopp, PA-C  Other Instructions  The 5-6 days around chemotherapy, decrease your Lasix (Furosemide) to 1 tablet once a day, then resume twice a day once you are feeling better.

## 2020-01-29 NOTE — Progress Notes (Signed)
Patient stated that she has some constipation but currently taking Miralax. Patient also stated that she is not able to sleep at nighttime.

## 2020-02-01 NOTE — Progress Notes (Signed)
Hematology/Oncology Consult note [facility]  Telephone:([phone] [phone]  Patient Care Team: [name], MD as PCP - General (Internal Medicine) Minus Breeding, MD as PCP - Cardiology (Cardiology) Jacelyn Pi, MD as Referring Physician (Endocrinology) Lahoma Rocker, MD as Consulting Physician (Rheumatology) Rico Junker, RN as Registered Nurse Theodore Demark, RN as Registered Nurse   Name of the patient: [name]  [id number]  09-24-1956   Date of visit: 02/01/20  Diagnosis- Invasive lobular carcinoma of theleftbreast pathological prognostic stage Ib pT2 pN2 acM0 ER/PR positive HER-2/neu negative s/p mastectomy and targeted node dissection  Chief complaint/ Reason for visit-on treatment assessment prior to cycle 2 of adjuvant TC chemotherapy  Heme/Onc history: Patient is a 64 year old female with a past medical history significant for hypertension, CKD, diabetes, nonischemic cardiomyopathy among other medical problems. She has not undergone a mammogram for several years. Mammogram on October 2020 was done after she had a palpable area of concern in the left breast which showed an irregular mass with increased vascularity measuring 3.1 x 2.4 x 4.5 cm. The nodularity extended towards the nipple measuring at least 5.4 cm. Single abnormal lymph node in the left axilla with cortical thickness of 0.7 cm. 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  PET scan showed hypermetabolic left axillary lymph nodes which was reviewed at tumor board and they wereat least found to be 3-4. Retroareolar left breast mass 2.5 x 3.2 cm in size. No evidence of distant metastatic disease. There were small to borderline enlarged retroperitoneal lymph nodes 10 mm in size with an SUV of  4.3.Retroperitoneal lymph nodes will require follow-up in the future  MRI showed 2 sites of biopsy-proven malignancy in the lateral left breast at posterior and anterior depth. There is non-mass enhancement spanning between the 2 sites. Morphologically abnormal level 1 axillary lymph node consistent with biopsy-proven metastatic disease.  Final pathology showed 2 separate tumors one which was 4.5 cm and the other one was 4.4 cm in size. Grade 2 ER/PR positive HER-2/neu negative with negative margins. 5 out of 11 lymph nodes were positive for malignancy. Extranodal extension present. Largest size of metastatic deposit 14 mm. mpT2 mpN2  Patient is not a candidate for anthracycline-based chemotherapy per cardiology.  Adjuvant TC chemotherapy started on 01/03/2020   Interval history-other than fatigue patient denies any complaints at this time.  She did not take her Decadron for chemo-induced nausea as it elevates her blood sugars.  ECOG PS- 1 Pain scale- 0   Review of systems- Review of Systems  Constitutional: Positive for malaise/fatigue. Negative for chills, fever and weight loss.  HENT: Negative for congestion, ear discharge and nosebleeds.   Eyes: Negative for blurred vision.  Respiratory: Negative for cough, hemoptysis, sputum production, shortness of breath and wheezing.   Cardiovascular: Negative for chest pain, palpitations, orthopnea and claudication.  Gastrointestinal: Negative for abdominal pain, blood in stool, constipation, diarrhea, heartburn, melena, nausea and vomiting.  Genitourinary: Negative for dysuria, flank pain, frequency, hematuria and urgency.  Musculoskeletal: Negative for back pain, joint pain and myalgias.  Skin: Negative for rash.  Neurological: Negative for dizziness, tingling, focal weakness, seizures, weakness and headaches.  Endo/Heme/Allergies: Does not bruise/bleed easily.  Psychiatric/Behavioral: Negative for depression and suicidal ideas. The  patient does not have insomnia.       Allergies  Allergen Reactions  . Hydralazine Swelling    Eye Swelling  . Other Other (See Comments)    Unknown blood pressure medication caused face swelling     Past Medical History:  Diagnosis Date  . Arthritis of both feet    Gout  . Cancer [facility])    Breast Cancer on left  . Chronic combined systolic and diastolic CHF (congestive heart failure) ([place])    a. Echo (06/2014): Severe LVH, EF 20-25%, no RWMA, Gr 3 DD, trivial MR, mild LAE, mild RVE, PASP 38 mmHg .>> b. Echo 2/16 EF 50-55% //  // c. Echo 02/2019: EF 50-55, mod LVH, Gr 1 DD, mild LAE   . CKD (chronic kidney disease), stage III    now stage 4  . Diabetes mellitus type 2 in obese ([place])   . Diverticular disease   . Dyspnea   . Family history of breast cancer   . Family history of leukemia   . Family history of lung cancer   . Family history of testicular cancer   . Family history of throat cancer   . Fatty liver   . History of GI diverticular bleed   . Hyperlipidemia   . Hyperphosphatemia   . Hypertension   . Hypertensive heart disease    a. Renal Artery Duplex (8/15):  no RAS  . Iron deficiency anemia   . NICM (nonischemic cardiomyopathy) ([place])    a. Nuclear (06/2014): EF 32%, apical thinning, no definite scar or ischemia;  b. Echo (2/16):  Mild LVH, EF 50-55%, Gr 2 DD, no RWMA  . Obesity   . PONV (postoperative nausea and vomiting)    after D and C  . Proteinuria   . PUD (peptic ulcer disease)   . Secondary hyperparathyroidism, renal ([place])   . Tobacco abuse   . UTI (lower urinary tract infection)   . Vaginal dryness, menopausal   . Vaginal yeast infection      Past Surgical History:  Procedure Laterality Date  . COLONOSCOPY    . DILATION AND CURETTAGE OF UTERUS    . MASTECTOMY W/ SENTINEL NODE BIOPSY Left 11/12/2019  . MASTECTOMY W/ SENTINEL NODE BIOPSY Left 11/12/2019   Procedure: LEFT MASTECTOMY WITH SENTINEL LYMPH NODE BIOPSY AND TARGETED NODE DISSECTION;   Surgeon: Jovita Kussmaul, MD;  Location: [place];  Service: General;  Laterality: Left;  . None    . PORTA CATH INSERTION N/A 12/11/2019   Procedure: PORTA CATH INSERTION;  Surgeon: Katha Cabal, MD;  Location: [facility];  Service: Cardiovascular;  Laterality: N/A;    Social History   Socioeconomic History  . Marital status: Widowed    Spouse name: Not on file  . Number of children: 2  . Years of education: 7  . Highest education level: Not on file  Occupational History  . Not on file  Tobacco Use  . Smoking status: Former Smoker    Quit date: 07/05/2014    Years since quitting: 5.5  . Smokeless tobacco: Never Used  Substance and Sexual Activity  . Alcohol use: No  . Drug use: No  . Sexual activity: Not on file  Other Topics Concern  . Not on file  Social History Narrative   Currently lives in a house with her husband.    Fun: Watch TV.   Denies religious beliefs effecting health care.    Social Determinants of Health   Financial Resource Strain:   . Difficulty of Paying Living Expenses: Not on  file  Food Insecurity:   . Worried About Charity fundraiser in the Last Year: Not on file  . Ran Out of Food in the Last Year: Not on file  Transportation Needs:   . Lack of Transportation (Medical): Not on file  . Lack of Transportation (Non-Medical): Not on file  Physical Activity:   . Days of Exercise per Week: Not on file  . Minutes of Exercise per Session: Not on file  Stress:   . Feeling of Stress : Not on file  Social Connections:   . Frequency of Communication with Friends and Family: Not on file  . Frequency of Social Gatherings with Friends and Family: Not on file  . Attends Religious Services: Not on file  . Active Member of Clubs or Organizations: Not on file  . Attends Archivist Meetings: Not on file  . Marital Status: Not on file  Intimate Partner Violence:   . Fear of Current or Ex-Partner: Not on file  . Emotionally Abused: Not on  file  . Physically Abused: Not on file  . Sexually Abused: Not on file    Family History  Problem Relation Age of Onset  . Heart attack Paternal Grandfather   . Stroke Paternal Grandmother   . Hypertension Paternal Grandmother   . Kidney disease Mother   . Diabetes Mother   . Testicular cancer Father        diagnosed older than 77  . Coronary artery disease Other   . Hypertension Other   . Breast cancer Sister        diagnosed late 66s, negative genetics  . Leukemia Brother        chronic  . Throat cancer Brother   . Pneumonia Sister 2  . Lung cancer Paternal Uncle        diagnosed over 63     Current Outpatient Medications:  .  acetaminophen (TYLENOL) 500 MG tablet, Take 1,000 mg by mouth every 6 (six) hours as needed for mild pain, moderate pain or headache. For pain, Disp: , Rfl:  .  allopurinol (ZYLOPRIM) 100 MG tablet, Take 200 mg by mouth daily., Disp: , Rfl:  .  amLODipine (NORVASC) 5 MG tablet, Take 1 tablet (5 mg total) by mouth daily. (Patient taking differently: Take 5 mg by mouth at bedtime. ), Disp: 90 tablet, Rfl: 3 .  aspirin EC 81 MG EC tablet, Take 1 tablet (81 mg total) by mouth daily., Disp: , Rfl:  .  atorvastatin (LIPITOR) 40 MG tablet, Take 1 tablet (40 mg total) by mouth daily at 6 PM., Disp: 90 tablet, Rfl: 3 .  B-D ULTRAFINE III SHORT PEN 31G X 8 MM MISC, USE AS DIRECTED *EMERGENCY FILL*, Disp: , Rfl: 0 .  blood glucose meter kit and supplies KIT, Dispense based on patient and insurance preference. Use up to four times daily as directed. (FOR ICD-9 250.00, 250.01)., Disp: 1 each, Rfl: 0 .  calcitRIOL (ROCALTROL) 0.25 MCG capsule, Take 0.25 mcg by mouth daily., Disp: , Rfl:  .  cloNIDine (CATAPRES) 0.1 MG tablet, Take 1 tablet (0.1 mg total) by mouth 2 (two) times daily., Disp: 180 tablet, Rfl: 3 .  Colchicine 0.6 MG CAPS, Take 0.6 mg by mouth every other day. , Disp: , Rfl:  .  furosemide (LASIX) 40 MG tablet, Take 40 mg by mouth 2 (two) times daily.,  Disp: , Rfl:  .  gabapentin (NEURONTIN) 100 MG capsule, Take 100 mg by mouth 3 (three)  times daily., Disp: , Rfl:  .  insulin aspart (NOVOLOG FLEXPEN) 100 UNIT/ML FlexPen, Inject 5 Units into the skin 3 (three) times daily with meals. Additional insulin (5 units PLUS) CBG 70 - 120: 0 units CBG 121 - 150: 1 unit CBG 151 - 200: 2 units CBG 201 - 250: 3 units CBG 251 - 350: 4 units CBG 351 - 400: 6 units (Patient taking differently: Inject 5 Units into the skin 3 (three) times daily with meals. Sliding scale  5 units/ plus base), Disp: 15 mL, Rfl: 11 .  insulin glargine (LANTUS) 100 UNIT/ML injection, Inject 20 Units into the skin at bedtime. , Disp: , Rfl:  .  isosorbide mononitrate (IMDUR) 120 MG 24 hr tablet, TAKE 1/2 TABLET DAILY = 60 MG DAILY (Patient taking differently: Take 60 mg by mouth daily. ), Disp: 45 tablet, Rfl: 3 .  lidocaine-prilocaine (EMLA) cream, Apply to affected area once, Disp: 30 g, Rfl: 3 .  loratadine (CLARITIN) 10 MG tablet, Take 10 mg by mouth daily as needed for allergies (around onpro neulasta with treatment)., Disp: , Rfl:  .  LORazepam (ATIVAN) 0.5 MG tablet, Take 1 tablet (0.5 mg total) by mouth every 6 (six) hours as needed (Nausea or vomiting)., Disp: 30 tablet, Rfl: 0 .  Menthol, Topical Analgesic, (ICY HOT EX), Apply 1 application topically daily as needed (pain)., Disp: , Rfl:  .  metoprolol tartrate (LOPRESSOR) 25 MG tablet, Take 1 tablet (25 mg total) by mouth 2 (two) times daily., Disp: 180 tablet, Rfl: 3 .  ondansetron (ZOFRAN) 8 MG tablet, Take 1 tablet (8 mg total) by mouth 2 (two) times daily as needed for refractory nausea / vomiting. Start on day 3 after chemo., Disp: 30 tablet, Rfl: 1 .  ONE TOUCH ULTRA TEST test strip, 1 each by Other route 4 (four) times daily. , Disp: , Rfl: 1 .  ONETOUCH DELICA LANCETS 06Y MISC, Inject 33 g into the skin 4 (four) times daily. , Disp: , Rfl: 1 .  polyethylene glycol (MIRALAX / GLYCOLAX) 17 g packet, Take 17 g by mouth  daily as needed for moderate constipation., Disp: , Rfl:  .  prochlorperazine (COMPAZINE) 10 MG tablet, Take 1 tablet (10 mg total) by mouth every 6 (six) hours as needed (Nausea or vomiting)., Disp: 30 tablet, Rfl: 1 .  telmisartan (MICARDIS) 20 MG tablet, Take 1 tablet (20 mg total) by mouth daily. (Patient taking differently: Take 20 mg by mouth at bedtime. ), Disp: 90 tablet, Rfl: 3 .  triamcinolone ointment (KENALOG) 0.5 %, Apply 1 application topically 3 (three) times daily., Disp: 30 g, Rfl: 2  Physical exam:  Vitals:   01/29/20 0941  BP: 111/66  Pulse: 68  Temp: (!) 97.1 F (36.2 C)  TempSrc: Tympanic  Weight: 193 lb 4.8 oz (87.7 kg)   Physical Exam HENT:     Head: Normocephalic and atraumatic.  Eyes:     Pupils: Pupils are equal, round, and reactive to light.  Cardiovascular:     Rate and Rhythm: Normal rate and regular rhythm.     Heart sounds: Normal heart sounds.  Pulmonary:     Effort: Pulmonary effort is normal.     Breath sounds: Normal breath sounds.  Abdominal:     General: Bowel sounds are normal.     Palpations: Abdomen is soft.  Musculoskeletal:     Cervical back: Normal range of motion.  Skin:    General: Skin is warm and dry.  Neurological:  Mental Status: She is alert and oriented to person, place, and time.      CMP Latest Ref Rng & Units 01/29/2020  Glucose 70 - 99 mg/dL 207(H)  BUN 8 - 23 mg/dL 56(H)  Creatinine 0.44 - 1.00 mg/dL 1.45(H)  Sodium 135 - 145 mmol/L 135  Potassium 3.5 - 5.1 mmol/L 3.9  Chloride 98 - 111 mmol/L 101  CO2 22 - 32 mmol/L 24  Calcium 8.9 - 10.3 mg/dL 8.8(L)  Total Protein 6.5 - 8.1 g/dL 6.9  Total Bilirubin 0.3 - 1.2 mg/dL 0.7  Alkaline Phos 38 - 126 U/L 100  AST 15 - 41 U/L 18  ALT 0 - 44 U/L 21   CBC Latest Ref Rng & Units 01/29/2020  WBC 4.0 - 10.5 K/uL 8.3  Hemoglobin 12.0 - 15.0 g/dL 11.0(L)  Hematocrit 36.0 - 46.0 % 35.2(L)  Platelets 150 - 400 K/uL 266      Assessment and plan- Patient is a 64 y.o.  female with pathological prognostic stage Ib invasive lobular carcinoma pT2 pN2 acM0 ER/PR positive HER-2/neu negative s/pleft mastectomy and targeted node dissection.    She is here for on treatment assessment prior to cycle 2 of adjuvant TC chemotherapy  Counts okay to proceed with cycle 2 of adjuvant TC chemotherapy today with on for Neulasta support.  She will be receiving a reduced dose of Decadron 4 mg IV as premedications given her brittle diabetes.  Her blood sugars are mildly elevated at 207 today.  Patient has baseline CKD and her serum creatinine is currently stable.  Mild chemo induced anemia which we will continue to monitor.  I will see her back in 3 weeks time for cycle 3 of adjuvant TC chemotherapy with on for Neulasta support.  At that time I will also make a referral to radiation oncology to discuss adjuvant radiation treatment upon completion of 4 cycles of treatment   Visit Diagnosis 1. Malignant neoplasm of overlapping sites of left breast in female, estrogen receptor positive ([place])   2. Encounter for antineoplastic chemotherapy   3. Antineoplastic chemotherapy induced anemia   4. Chronic kidney disease, unspecified CKD stage      Dr. Randa Evens, MD, MPH [facility] [phone] 02/01/2020 8:26 AM

## 2020-02-04 ENCOUNTER — Other Ambulatory Visit: Payer: Self-pay

## 2020-02-04 ENCOUNTER — Ambulatory Visit: Payer: PPO | Admitting: Occupational Therapy

## 2020-02-04 DIAGNOSIS — I972 Postmastectomy lymphedema syndrome: Secondary | ICD-10-CM

## 2020-02-04 NOTE — Patient Instructions (Signed)
Pt cont with temporary compression - and call me when her compression garments comes in

## 2020-02-04 NOTE — Therapy (Signed)
[facility] [address] [place], [place], [address] Phone: [phone]   Fax:  [phone]  Occupational Therapy Treatment  Patient Details  Name: [name] MRN: [id number] Date of Birth: 12/16/1956 Referring Provider (OT): Rao   Encounter Date: 02/04/2020  OT End of Session - 02/04/20 0847    Visit Number  3    Number of Visits  8    Date for OT Re-Evaluation  03/10/20    OT Start Time  0817    OT Stop Time  0843    OT Time Calculation (min)  26 min    Activity Tolerance  Patient tolerated treatment well    Behavior During Therapy  WFL for tasks assessed/performed       Past Medical History:  Diagnosis Date  . Arthritis of both feet    Gout  . Cancer (HCC)    Breast Cancer on left  . Chronic combined systolic and diastolic CHF (congestive heart failure) (HCC)    a. Echo (06/2014): Severe LVH, EF 20-25%, no RWMA, Gr 3 DD, trivial MR, mild LAE, mild RVE, PASP 38 mmHg .>> b. Echo 2/16 EF 50-55% //  // c. Echo 02/2019: EF 50-55, mod LVH, Gr 1 DD, mild LAE   . CKD (chronic kidney disease), stage III    now stage 4  . Diabetes mellitus type 2 in obese (HCC)   . Diverticular disease   . Dyspnea   . Family history of breast cancer   . Family history of leukemia   . Family history of lung cancer   . Family history of testicular cancer   . Family history of throat cancer   . Fatty liver   . History of GI diverticular bleed   . Hyperlipidemia   . Hyperphosphatemia   . Hypertension   . Hypertensive heart disease    a. Renal Artery Duplex (8/15):  no RAS  . Iron deficiency anemia   . NICM (nonischemic cardiomyopathy) (HCC)    a. Nuclear (06/2014): EF 32%, apical thinning, no definite scar or ischemia;  b. Echo (2/16):  Mild LVH, EF 50-55%, Gr 2 DD, no RWMA  . Obesity   . PONV (postoperative nausea and vomiting)    after D and C  . Proteinuria   . PUD (peptic ulcer disease)   . Secondary  hyperparathyroidism, renal (HCC)   . Tobacco abuse   . UTI (lower urinary tract infection)   . Vaginal dryness, menopausal   . Vaginal yeast infection     Past Surgical History:  Procedure Laterality Date  . COLONOSCOPY    . DILATION AND CURETTAGE OF UTERUS    . MASTECTOMY W/ SENTINEL NODE BIOPSY Left 11/12/2019  . MASTECTOMY W/ SENTINEL NODE BIOPSY Left 11/12/2019   Procedure: LEFT MASTECTOMY WITH SENTINEL LYMPH NODE BIOPSY AND TARGETED NODE DISSECTION;  Surgeon: Toth, Paul III, MD;  Location: [facility] OR;  Service: General;  Laterality: Left;  . None    . PORTA CATH INSERTION N/A 12/11/2019   Procedure: PORTA CATH INSERTION;  Surgeon: Schnier, Gregory G, MD;  Location: [facility] INVASIVE CV LAB;  Service: Cardiovascular;  Laterality: N/A;    There were no vitals filed for this visit.  Subjective Assessment - 02/04/20 0845    Subjective   My upper arm still feels not as tight , heavy and ache - they did measure me last Friday - I got the bra's and prosthesis - I like them - the   chemo just make me feeling sick the first week    Pertinent History  Patient is a 64 y.o. 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  No/denies          LYMPHEDEMA/ONCOLOGY QUESTIONNAIRE - 02/04/20 0821      Left Upper Extremity Lymphedema   15 cm Proximal to Olecranon Process  39.4 cm    10 cm Proximal to Olecranon Process  35.5 cm    Olecranon Process  28 cm    15 cm Proximal to  Ulnar Styloid Process  26.8 cm    10 cm Proximal to Ulnar Styloid Process  23.3 cm    Just Proximal to Ulnar Styloid Process  18.8 cm    Across Hand at [employer]  19.5 cm    At [facility] of 2nd Digit  7 cm    At [facility]  6.5 cm        Measure L UE circumference - decrease again this week in L UE - see flow sheet  Pt report feeling less heavy ,tight and fullness in L upper arm with compression as well as not as ache    pt provided with new  temporary compression to wear while waiting for her compression garments isotoner glove on L hand , soft tubigrip on hand to elbow and tubigrip E from forearm to axilla  Pt to call when her garments come in to assess fit   Pt's scar on L chest improved greatly - no tenderness or adhesions - numb on L side of breast and under arm - all the glue come off -  She did get fitted for bra's and prothesis at  [facility]  And was measured for compression sleeves for daytime and night time ( await arrival -  For [employer] soft sleeve and glove - did get pink ribbon fund assistance) Pt also got knitted knockers - that is a little lighter to use                OT Education - 02/04/20 0846    Education Details  progress in  decrease L UE circumference and scar improved    Person(s) Educated  Patient    Methods  Explanation;Demonstration;Tactile cues;Verbal cues;Handout    Comprehension  Verbal cues required;Returned demonstration;Verbalized understanding          OT Long Term Goals - 01/14/20 1610      OT LONG TERM GOAL #1   Title  L UE circumference decrease by at least 1 cm at wrist , forearm and upper arm to get measured for custom copmression sleeve and glove    Baseline  L wrist increase by 1.8 cm , forearm 2 cm , upper arm by 2.5 cm - no compression    Time  4    Period  Weeks  Status  New    Target Date  02/11/20      OT LONG TERM GOAL #2   Title  Pt and family to be independent in wearing of compression  garments to decrease circumfernce and maintain lymphedema in L UE    Baseline  no knowledge on garments    Time  8    Period  Weeks    Status  New    Target Date  03/10/20      OT LONG TERM GOAL #3   Title  Pt and son be independent in doing HEP to increase activity tolerance and strengthening without increase lympphedema    Baseline  did not do any exercises for L UE - scar on L chest still have glue on - pt do not want to touch or have anybody look at it - no scar adhesion - pt to wash chest    Time  5    Period  Weeks    Status  New    Target Date  02/18/20            Plan - 02/04/20 0848    Clinical Impression Statement  Pt was measured for her compression garments for L UE lymphedema- her L UE circumference did decrease with the temporary compression  the last few wks - she was fitted with new bra's and prosthesis - pt to make sure her bra has wide band under the arm to provide compression over pocket of skin from mastectomy - pt to call me when her night time and daytime compression sleeves comes in    OT Occupational Profile and History  Problem Focused Assessment - Including review of records relating to presenting problem    Occupational performance deficits (Please refer to evaluation for details):  ADL's;IADL's;Play;Leisure    Body Structure / Function / Physical Skills  UE functional use;Edema    Rehab Potential  Good    Clinical Decision Making  Limited treatment options, no task modification necessary    Comorbidities Affecting Occupational Performance:  May have comorbidities impacting occupational performance    Modification or Assistance to Complete Evaluation   No modification of tasks or assist necessary to complete eval    OT Frequency  1x / week    OT Duration  6 weeks    OT Treatment/Interventions  Self-care/ADL training;Manual lymph drainage;Patient/family education;Compression bandaging;Scar mobilization;Manual Therapy;Therapeutic exercise    Plan  pt to call  me when her compression garments comes in    OT Home Exercise Plan  see pt instruction    Consulted and Agree with Plan of Care  Patient       Patient will benefit from skilled therapeutic intervention in order to improve the following deficits and impairments:   Body Structure / Function / Physical Skills: UE functional use, Edema       Visit Diagnosis: Postmastectomy lymphedema syndrome    Problem List Patient Active Problem List   Diagnosis Date Noted  . Cancer of left female breast  (HCC) 11/12/2019  . Goals of care, counseling/discussion 10/14/2019  . Family history of breast cancer   . Family history of testicular cancer   . Family history of lung cancer   . Family history of throat cancer   . Family history of leukemia   . Malignant neoplasm of overlapping sites of left breast in female, estrogen receptor positive (HCC) 10/09/2019  . Pain in right foot 10/24/2018  . Type 2 diabetes mellitus with stage 3 chronic kidney   disease, with long-term current use of insulin (HCC) 01/31/2018  . Pancreatitis, acute 12/06/2015  . ARF (acute renal failure) (HCC) 12/05/2015  . Rectal bleeding 12/05/2015  . Elevated troponin 12/05/2015  . Abdominal distention   . DKA (diabetic ketoacidoses) (HCC) 12/04/2015  . Rash 03/26/2015  . Elevated alkaline phosphatase level 03/26/2015  . NICM (nonischemic cardiomyopathy) (HCC) 08/12/2014  . Chronic diastolic CHF (congestive heart failure) (HCC) 07/07/2014  . NSTEMI (non-ST elevated myocardial infarction) (HCC) 07/07/2014  . Hypertensive heart disease with CHF (congestive heart failure) (HCC) 07/07/2014  . CKD (chronic kidney disease) stage 3, GFR 30-59 ml/min (HCC) 07/07/2014  . Non compliance w medication regimen secondary to "allergies" and cost 07/07/2014  . PUD (peptic ulcer disease)- endoscopy 2012 07/07/2014  . GI bleed 10/01/2012  . Diverticulitis- lower GI bleed 2013 10/01/2012    DuPreez, Maureen OTR/l,CLT 02/04/2020, 8:51  AM  [facility] [address] , [place], [address] Phone: [phone]   Fax:  [phone]  Name: [name] MRN: [id number] Date of Birth: 06/26/1956 

## 2020-02-13 ENCOUNTER — Telehealth: Payer: Self-pay | Admitting: *Deleted

## 2020-02-13 NOTE — Telephone Encounter (Signed)
Called pt to see how she is doing with nausea. Last time I spoke to her she was having nausea issues and I told her that she should take nausea med around the clock every 6 hours and see if that would help. She would call me back if it did not work. Today she states that she is taking the ondansetron and taking it twice a day and it is working good and no nausea.

## 2020-02-14 ENCOUNTER — Inpatient Hospital Stay: Payer: PPO

## 2020-02-14 ENCOUNTER — Other Ambulatory Visit: Payer: Self-pay

## 2020-02-14 NOTE — Progress Notes (Signed)
Nutrition Follow-up:  Patient with lobular breast carcinoma (left).  Patient s/p mastectomy and receiving adjuvant chemotherapy.    Spoke with patient via phone.  Patient reports that she had nausea about 9 days with this past treatment but started taking the zofran with relief.  Reports that she has found the nausea handout with tips and suggestions helpful. Has tried the baking soda, salt and water rinses for taste alterations as well with success.  Although, she is still having a hard time with taste of foods especially following chemotherapy.      Medications: reviewed  Labs: reviewed  Anthropometrics:   Weight is 193 lb 4.8 oz on 2/9 increased from 1919 lb on 1/29   NUTRITION DIAGNOSIS: Inadequate oral intake stable    INTERVENTION:  Encouraged nausea medications as prescribed. Encouraged patient to continue with strategies to help nausea and taste alterations.   Patient will call RD if needed in the future.     NEXT VISIT: no follow-up, patient to call RD if needed  Joli B. Zenia Resides, [place], [place] Registered Dietitian [phone] (pager)

## 2020-02-18 ENCOUNTER — Encounter: Payer: Self-pay | Admitting: Oncology

## 2020-02-18 ENCOUNTER — Other Ambulatory Visit: Payer: Self-pay

## 2020-02-18 NOTE — Progress Notes (Signed)
Patient stated that she had been doing well with no concerns. 

## 2020-02-19 ENCOUNTER — Inpatient Hospital Stay: Payer: PPO

## 2020-02-19 ENCOUNTER — Other Ambulatory Visit: Payer: Self-pay

## 2020-02-19 ENCOUNTER — Inpatient Hospital Stay (HOSPITAL_BASED_OUTPATIENT_CLINIC_OR_DEPARTMENT_OTHER): Payer: PPO | Admitting: Oncology

## 2020-02-19 ENCOUNTER — Inpatient Hospital Stay: Payer: PPO | Attending: Oncology

## 2020-02-19 VITALS — BP 97/64 | HR 67 | Temp 96.9°F | Ht 60.0 in | Wt 193.0 lb

## 2020-02-19 DIAGNOSIS — T451X5A Adverse effect of antineoplastic and immunosuppressive drugs, initial encounter: Secondary | ICD-10-CM | POA: Insufficient documentation

## 2020-02-19 DIAGNOSIS — C50812 Malignant neoplasm of overlapping sites of left female breast: Secondary | ICD-10-CM

## 2020-02-19 DIAGNOSIS — Z794 Long term (current) use of insulin: Secondary | ICD-10-CM | POA: Insufficient documentation

## 2020-02-19 DIAGNOSIS — Z806 Family history of leukemia: Secondary | ICD-10-CM | POA: Diagnosis not present

## 2020-02-19 DIAGNOSIS — E119 Type 2 diabetes mellitus without complications: Secondary | ICD-10-CM | POA: Diagnosis not present

## 2020-02-19 DIAGNOSIS — Z5189 Encounter for other specified aftercare: Secondary | ICD-10-CM | POA: Insufficient documentation

## 2020-02-19 DIAGNOSIS — Z5111 Encounter for antineoplastic chemotherapy: Secondary | ICD-10-CM | POA: Insufficient documentation

## 2020-02-19 DIAGNOSIS — Z801 Family history of malignant neoplasm of trachea, bronchus and lung: Secondary | ICD-10-CM | POA: Insufficient documentation

## 2020-02-19 DIAGNOSIS — Z7982 Long term (current) use of aspirin: Secondary | ICD-10-CM | POA: Diagnosis not present

## 2020-02-19 DIAGNOSIS — Z170 Estrogen receptor positive status [ER+]: Secondary | ICD-10-CM

## 2020-02-19 DIAGNOSIS — Z87891 Personal history of nicotine dependence: Secondary | ICD-10-CM | POA: Insufficient documentation

## 2020-02-19 DIAGNOSIS — D6481 Anemia due to antineoplastic chemotherapy: Secondary | ICD-10-CM | POA: Diagnosis not present

## 2020-02-19 DIAGNOSIS — R7989 Other specified abnormal findings of blood chemistry: Secondary | ICD-10-CM | POA: Insufficient documentation

## 2020-02-19 DIAGNOSIS — N184 Chronic kidney disease, stage 4 (severe): Secondary | ICD-10-CM | POA: Insufficient documentation

## 2020-02-19 DIAGNOSIS — I10 Essential (primary) hypertension: Secondary | ICD-10-CM | POA: Diagnosis not present

## 2020-02-19 DIAGNOSIS — Z95828 Presence of other vascular implants and grafts: Secondary | ICD-10-CM

## 2020-02-19 DIAGNOSIS — N189 Chronic kidney disease, unspecified: Secondary | ICD-10-CM

## 2020-02-19 DIAGNOSIS — Z9012 Acquired absence of left breast and nipple: Secondary | ICD-10-CM | POA: Diagnosis not present

## 2020-02-19 DIAGNOSIS — Z79899 Other long term (current) drug therapy: Secondary | ICD-10-CM | POA: Diagnosis not present

## 2020-02-19 DIAGNOSIS — Z803 Family history of malignant neoplasm of breast: Secondary | ICD-10-CM | POA: Diagnosis not present

## 2020-02-19 DIAGNOSIS — Z8349 Family history of other endocrine, nutritional and metabolic diseases: Secondary | ICD-10-CM | POA: Insufficient documentation

## 2020-02-19 DIAGNOSIS — Z833 Family history of diabetes mellitus: Secondary | ICD-10-CM | POA: Insufficient documentation

## 2020-02-19 DIAGNOSIS — Z8249 Family history of ischemic heart disease and other diseases of the circulatory system: Secondary | ICD-10-CM | POA: Insufficient documentation

## 2020-02-19 LAB — CBC WITH DIFFERENTIAL/PLATELET
Abs Immature Granulocytes: 0.02 10*3/uL (ref 0.00–0.07)
Basophils Absolute: 0.2 10*3/uL — ABNORMAL HIGH (ref 0.0–0.1)
Basophils Relative: 3 %
Eosinophils Absolute: 0.1 10*3/uL (ref 0.0–0.5)
Eosinophils Relative: 1 %
HCT: 34.4 % — ABNORMAL LOW (ref 36.0–46.0)
Hemoglobin: 10.7 g/dL — ABNORMAL LOW (ref 12.0–15.0)
Immature Granulocytes: 0 %
Lymphocytes Relative: 12 %
Lymphs Abs: 0.7 10*3/uL (ref 0.7–4.0)
MCH: 27.8 pg (ref 26.0–34.0)
MCHC: 31.1 g/dL (ref 30.0–36.0)
MCV: 89.4 fL (ref 80.0–100.0)
Monocytes Absolute: 0.6 10*3/uL (ref 0.1–1.0)
Monocytes Relative: 11 %
Neutro Abs: 4.1 10*3/uL (ref 1.7–7.7)
Neutrophils Relative %: 73 %
Platelets: 207 10*3/uL (ref 150–400)
RBC: 3.85 MIL/uL — ABNORMAL LOW (ref 3.87–5.11)
RDW: 20.4 % — ABNORMAL HIGH (ref 11.5–15.5)
WBC: 5.6 10*3/uL (ref 4.0–10.5)
nRBC: 0 % (ref 0.0–0.2)

## 2020-02-19 LAB — COMPREHENSIVE METABOLIC PANEL
ALT: 15 U/L (ref 0–44)
AST: 17 U/L (ref 15–41)
Albumin: 3.6 g/dL (ref 3.5–5.0)
Alkaline Phosphatase: 111 U/L (ref 38–126)
Anion gap: 11 (ref 5–15)
BUN: 52 mg/dL — ABNORMAL HIGH (ref 8–23)
CO2: 23 mmol/L (ref 22–32)
Calcium: 8.7 mg/dL — ABNORMAL LOW (ref 8.9–10.3)
Chloride: 105 mmol/L (ref 98–111)
Creatinine, Ser: 1.78 mg/dL — ABNORMAL HIGH (ref 0.44–1.00)
GFR calc Af Amer: 34 mL/min — ABNORMAL LOW (ref >60)
GFR calc non Af Amer: 29 mL/min — ABNORMAL LOW (ref >60)
Glucose, Bld: 131 mg/dL — ABNORMAL HIGH (ref 70–99)
Potassium: 3.7 mmol/L (ref 3.5–5.1)
Sodium: 139 mmol/L (ref 135–145)
Total Bilirubin: 0.6 mg/dL (ref 0.3–1.2)
Total Protein: 6.6 g/dL (ref 6.5–8.1)

## 2020-02-19 MED ORDER — PEGFILGRASTIM 6 MG/0.6ML ~~LOC~~ PSKT
6.0000 mg | PREFILLED_SYRINGE | Freq: Once | SUBCUTANEOUS | Status: AC
  Administered 2020-02-19: 6 mg via SUBCUTANEOUS
  Filled 2020-02-19: qty 0.6

## 2020-02-19 MED ORDER — SODIUM CHLORIDE 0.9 % IV SOLN
600.0000 mg/m2 | Freq: Once | INTRAVENOUS | Status: AC
  Administered 2020-02-19: 1160 mg via INTRAVENOUS
  Filled 2020-02-19: qty 58

## 2020-02-19 MED ORDER — ONDANSETRON HCL 8 MG PO TABS: 8.0000 mg | ORAL_TABLET | Freq: Two times a day (BID) | ORAL | 1 refills | Status: DC | PRN

## 2020-02-19 MED ORDER — PALONOSETRON HCL INJECTION 0.25 MG/5ML
0.2500 mg | Freq: Once | INTRAVENOUS | Status: AC
  Administered 2020-02-19: 0.25 mg via INTRAVENOUS
  Filled 2020-02-19: qty 5

## 2020-02-19 MED ORDER — SODIUM CHLORIDE 0.9% FLUSH
10.0000 mL | Freq: Once | INTRAVENOUS | Status: AC
  Administered 2020-02-19: 10 mL via INTRAVENOUS
  Filled 2020-02-19: qty 10

## 2020-02-19 MED ORDER — DEXAMETHASONE SODIUM PHOSPHATE 10 MG/ML IJ SOLN
4.0000 mg | Freq: Once | INTRAMUSCULAR | Status: AC
  Administered 2020-02-19: 4 mg via INTRAVENOUS
  Filled 2020-02-19: qty 1

## 2020-02-19 MED ORDER — HEPARIN SOD (PORK) LOCK FLUSH 100 UNIT/ML IV SOLN
500.0000 [IU] | Freq: Once | INTRAVENOUS | Status: AC | PRN
  Administered 2020-02-19: 500 [IU]
  Filled 2020-02-19: qty 5

## 2020-02-19 MED ORDER — PROCHLORPERAZINE MALEATE 10 MG PO TABS: 10.0000 mg | ORAL_TABLET | Freq: Four times a day (QID) | ORAL | 1 refills | Status: DC | PRN

## 2020-02-19 MED ORDER — HEPARIN SOD (PORK) LOCK FLUSH 100 UNIT/ML IV SOLN
INTRAVENOUS | Status: AC
  Filled 2020-02-19: qty 5

## 2020-02-19 MED ORDER — SODIUM CHLORIDE 0.9 % IV SOLN
Freq: Once | INTRAVENOUS | Status: AC
  Filled 2020-02-19: qty 250

## 2020-02-19 MED ORDER — SODIUM CHLORIDE 0.9 % IV SOLN
75.0000 mg/m2 | Freq: Once | INTRAVENOUS | Status: AC
  Administered 2020-02-19: 140 mg via INTRAVENOUS
  Filled 2020-02-19: qty 14

## 2020-02-19 NOTE — Progress Notes (Signed)
Hematology/Oncology Consult note [facility]  Telephone:([phone] [phone]  Patient Care Team: [name], MD as PCP - General (Internal Medicine) Minus Breeding, MD as PCP - Cardiology (Cardiology) Jacelyn Pi, MD as Referring Physician (Endocrinology) Lahoma Rocker, MD as Consulting Physician (Rheumatology) Rico Junker, RN as Registered Nurse Theodore Demark, RN as Registered Nurse   Name of the patient: [name]  [id number]  April 08, 1956   Date of visit: 02/19/20  Diagnosis- Invasive lobular carcinoma of theleftbreast pathological prognostic stage Ib pT2 pN2 acM0 ER/PR positive HER-2/neu negative s/p mastectomy and targeted node dissection  Chief complaint/ Reason for visit-on treatment assessment prior to cycle 3 of adjuvant TC chemotherapy  Heme/Onc history: Patient is a 64 year old female with a past medical history significant for hypertension, CKD, diabetes, nonischemic cardiomyopathy among other medical problems. She has not undergone a mammogram for several years. Mammogram on October 2020 was done after she had a palpable area of concern in the left breast which showed an irregular mass with increased vascularity measuring 3.1 x 2.4 x 4.5 cm. The nodularity extended towards the nipple measuring at least 5.4 cm. Single abnormal lymph node in the left axilla with cortical thickness of 0.7 cm. 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  PET scan showed hypermetabolic left axillary lymph nodes which was reviewed at tumor board and they wereat least found to be 3-4. Retroareolar left breast mass 2.5 x 3.2 cm in size. No evidence of distant metastatic disease. There were small to borderline enlarged retroperitoneal lymph nodes 10 mm in size with an SUV of  4.3.Retroperitoneal lymph nodes will require follow-up in the future  MRI showed 2 sites of biopsy-proven malignancy in the lateral left breast at posterior and anterior depth. There is non-mass enhancement spanning between the 2 sites. Morphologically abnormal level 1 axillary lymph node consistent with biopsy-proven metastatic disease.  Final pathology showed 2 separate tumors one which was 4.5 cm and the other one was 4.4 cm in size. Grade 2 ER/PR positive HER-2/neu negative with negative margins. 5 out of 11 lymph nodes were positive for malignancy. Extranodal extension present. Largest size of metastatic deposit 14 mm. mpT2 mpN2  Patient is not a candidate for anthracycline-based chemotherapy per cardiology. Adjuvant TC chemotherapy started on 01/03/2020   Interval history-she did have significant nausea after chemotherapy but after she started taking Zofran it was better controlled.  Today she denies any significant nausea or vomiting.  Has ongoing fatigue but denies any new complaints  ECOG PS- 1 Pain scale- 0   Review of systems- Review of Systems  Constitutional: Positive for malaise/fatigue. Negative for chills, fever and weight loss.  HENT: Negative for congestion, ear discharge and nosebleeds.   Eyes: Negative for blurred vision.  Respiratory: Negative for cough, hemoptysis, sputum production, shortness of breath and wheezing.   Cardiovascular: Negative for chest pain, palpitations, orthopnea and claudication.  Gastrointestinal: Negative for abdominal pain, blood in stool, constipation, diarrhea, heartburn, melena, nausea and vomiting.  Genitourinary: Negative for dysuria, flank pain, frequency, hematuria and urgency.  Musculoskeletal: Negative for back pain, joint pain and myalgias.  Skin: Negative for rash.  Neurological: Negative for dizziness, tingling, focal weakness, seizures, weakness and headaches.  Endo/Heme/Allergies: Does not bruise/bleed easily.    Psychiatric/Behavioral: Negative for depression and suicidal ideas. The patient does not have  insomnia.       Allergies  Allergen Reactions  . Hydralazine Swelling    Eye Swelling  . Other Other (See Comments)    Unknown blood pressure medication caused face swelling     Past Medical History:  Diagnosis Date  . Arthritis of both feet    Gout  . Cancer [facility])    Breast Cancer on left  . Chronic combined systolic and diastolic CHF (congestive heart failure) ([place])    a. Echo (06/2014): Severe LVH, EF 20-25%, no RWMA, Gr 3 DD, trivial MR, mild LAE, mild RVE, PASP 38 mmHg .>> b. Echo 2/16 EF 50-55% //  // c. Echo 02/2019: EF 50-55, mod LVH, Gr 1 DD, mild LAE   . CKD (chronic kidney disease), stage III    now stage 4  . Diabetes mellitus type 2 in obese ([place])   . Diverticular disease   . Dyspnea   . Family history of breast cancer   . Family history of leukemia   . Family history of lung cancer   . Family history of testicular cancer   . Family history of throat cancer   . Fatty liver   . History of GI diverticular bleed   . Hyperlipidemia   . Hyperphosphatemia   . Hypertension   . Hypertensive heart disease    a. Renal Artery Duplex (8/15):  no RAS  . Iron deficiency anemia   . NICM (nonischemic cardiomyopathy) ([place])    a. Nuclear (06/2014): EF 32%, apical thinning, no definite scar or ischemia;  b. Echo (2/16):  Mild LVH, EF 50-55%, Gr 2 DD, no RWMA  . Obesity   . PONV (postoperative nausea and vomiting)    after D and C  . Proteinuria   . PUD (peptic ulcer disease)   . Secondary hyperparathyroidism, renal ([place])   . Tobacco abuse   . UTI (lower urinary tract infection)   . Vaginal dryness, menopausal   . Vaginal yeast infection      Past Surgical History:  Procedure Laterality Date  . COLONOSCOPY    . DILATION AND CURETTAGE OF UTERUS    . MASTECTOMY W/ SENTINEL NODE BIOPSY Left 11/12/2019  . MASTECTOMY W/ SENTINEL NODE BIOPSY Left 11/12/2019   Procedure: LEFT  MASTECTOMY WITH SENTINEL LYMPH NODE BIOPSY AND TARGETED NODE DISSECTION;  Surgeon: Jovita Kussmaul, MD;  Location: [place];  Service: General;  Laterality: Left;  . None    . PORTA CATH INSERTION N/A 12/11/2019   Procedure: PORTA CATH INSERTION;  Surgeon: Katha Cabal, MD;  Location: [place] [facility];  Service: Cardiovascular;  Laterality: N/A;    Social History   Socioeconomic History  . Marital status: Widowed    Spouse name: Not on file  . Number of children: 2  . Years of education: 7  . Highest education level: Not on file  Occupational History  . Not on file  Tobacco Use  . Smoking status: Former Smoker    Quit date: 07/05/2014    Years since quitting: 5.6  . Smokeless tobacco: Never Used  Substance and Sexual Activity  . Alcohol use: No  . Drug use: No  . Sexual activity: Not on file  Other Topics Concern  . Not on file  Social History Narrative   Currently lives in a house with her husband.    Fun: Watch TV.   Denies religious beliefs effecting health care.    Social Determinants of Health   Financial Resource Strain:   .  Difficulty of Paying Living Expenses: Not on file  Food Insecurity:   . Worried About Charity fundraiser in the Last Year: Not on file  . Ran Out of Food in the Last Year: Not on file  Transportation Needs:   . Lack of Transportation (Medical): Not on file  . Lack of Transportation (Non-Medical): Not on file  Physical Activity:   . Days of Exercise per Week: Not on file  . Minutes of Exercise per Session: Not on file  Stress:   . Feeling of Stress : Not on file  Social Connections:   . Frequency of Communication with Friends and Family: Not on file  . Frequency of Social Gatherings with Friends and Family: Not on file  . Attends Religious Services: Not on file  . Active Member of Clubs or Organizations: Not on file  . Attends Archivist Meetings: Not on file  . Marital Status: Not on file  Intimate Partner Violence:   .  Fear of Current or Ex-Partner: Not on file  . Emotionally Abused: Not on file  . Physically Abused: Not on file  . Sexually Abused: Not on file    Family History  Problem Relation Age of Onset  . Heart attack Paternal Grandfather   . Stroke Paternal Grandmother   . Hypertension Paternal Grandmother   . Kidney disease Mother   . Diabetes Mother   . Testicular cancer Father        diagnosed older than 69  . Coronary artery disease Other   . Hypertension Other   . Breast cancer Sister        diagnosed late 38s, negative genetics  . Leukemia Brother        chronic  . Throat cancer Brother   . Pneumonia Sister 2  . Lung cancer Paternal Uncle        diagnosed over 9     Current Outpatient Medications:  .  acetaminophen (TYLENOL) 500 MG tablet, Take 1,000 mg by mouth every 6 (six) hours as needed for mild pain, moderate pain or headache. For pain, Disp: , Rfl:  .  allopurinol (ZYLOPRIM) 100 MG tablet, Take 200 mg by mouth daily., Disp: , Rfl:  .  amLODipine (NORVASC) 5 MG tablet, Take 1 tablet (5 mg total) by mouth daily. (Patient taking differently: Take 5 mg by mouth at bedtime. ), Disp: 90 tablet, Rfl: 3 .  aspirin EC 81 MG EC tablet, Take 1 tablet (81 mg total) by mouth daily., Disp: , Rfl:  .  atorvastatin (LIPITOR) 40 MG tablet, Take 1 tablet (40 mg total) by mouth daily at 6 PM., Disp: 90 tablet, Rfl: 3 .  B-D ULTRAFINE III SHORT PEN 31G X 8 MM MISC, USE AS DIRECTED *EMERGENCY FILL*, Disp: , Rfl: 0 .  blood glucose meter kit and supplies KIT, Dispense based on patient and insurance preference. Use up to four times daily as directed. (FOR ICD-9 250.00, 250.01)., Disp: 1 each, Rfl: 0 .  calcitRIOL (ROCALTROL) 0.25 MCG capsule, Take 0.25 mcg by mouth daily., Disp: , Rfl:  .  cloNIDine (CATAPRES) 0.1 MG tablet, Take 1 tablet (0.1 mg total) by mouth 2 (two) times daily., Disp: 180 tablet, Rfl: 3 .  Colchicine 0.6 MG CAPS, Take 0.6 mg by mouth every other day. , Disp: , Rfl:  .   furosemide (LASIX) 40 MG tablet, Take 40 mg by mouth 2 (two) times daily., Disp: , Rfl:  .  gabapentin (NEURONTIN) 100 MG capsule,  Take 100 mg by mouth 3 (three) times daily., Disp: , Rfl:  .  insulin aspart (NOVOLOG FLEXPEN) 100 UNIT/ML FlexPen, Inject 5 Units into the skin 3 (three) times daily with meals. Additional insulin (5 units PLUS) CBG 70 - 120: 0 units CBG 121 - 150: 1 unit CBG 151 - 200: 2 units CBG 201 - 250: 3 units CBG 251 - 350: 4 units CBG 351 - 400: 6 units (Patient taking differently: Inject 5 Units into the skin 3 (three) times daily with meals. Sliding scale  5 units/ plus base), Disp: 15 mL, Rfl: 11 .  insulin glargine (LANTUS) 100 UNIT/ML injection, Inject 20 Units into the skin at bedtime. , Disp: , Rfl:  .  isosorbide mononitrate (IMDUR) 120 MG 24 hr tablet, TAKE 1/2 TABLET DAILY = 60 MG DAILY (Patient taking differently: Take 60 mg by mouth daily. ), Disp: 45 tablet, Rfl: 3 .  lidocaine-prilocaine (EMLA) cream, Apply to affected area once, Disp: 30 g, Rfl: 3 .  loratadine (CLARITIN) 10 MG tablet, Take 10 mg by mouth daily as needed for allergies (around onpro neulasta with treatment)., Disp: , Rfl:  .  LORazepam (ATIVAN) 0.5 MG tablet, Take 1 tablet (0.5 mg total) by mouth every 6 (six) hours as needed (Nausea or vomiting)., Disp: 30 tablet, Rfl: 0 .  Menthol, Topical Analgesic, (ICY HOT EX), Apply 1 application topically daily as needed (pain)., Disp: , Rfl:  .  metoprolol tartrate (LOPRESSOR) 25 MG tablet, Take 1 tablet (25 mg total) by mouth 2 (two) times daily., Disp: 180 tablet, Rfl: 3 .  ondansetron (ZOFRAN) 8 MG tablet, Take 1 tablet (8 mg total) by mouth 2 (two) times daily as needed for refractory nausea / vomiting. Start on day 3 after chemo., Disp: 30 tablet, Rfl: 1 .  ONE TOUCH ULTRA TEST test strip, 1 each by Other route 4 (four) times daily. , Disp: , Rfl: 1 .  ONETOUCH DELICA LANCETS 45X MISC, Inject 33 g into the skin 4 (four) times daily. , Disp: , Rfl: 1 .   polyethylene glycol (MIRALAX / GLYCOLAX) 17 g packet, Take 17 g by mouth daily as needed for moderate constipation., Disp: , Rfl:  .  prochlorperazine (COMPAZINE) 10 MG tablet, Take 1 tablet (10 mg total) by mouth every 6 (six) hours as needed (Nausea or vomiting)., Disp: 30 tablet, Rfl: 1 .  telmisartan (MICARDIS) 20 MG tablet, Take 1 tablet (20 mg total) by mouth daily. (Patient taking differently: Take 20 mg by mouth at bedtime. ), Disp: 90 tablet, Rfl: 3 .  triamcinolone ointment (KENALOG) 0.5 %, Apply 1 application topically 3 (three) times daily., Disp: 30 g, Rfl: 2  Physical exam:  Vitals:   02/19/20 0834  BP: 97/64  Pulse: 67  Temp: (!) 96.9 F (36.1 C)  TempSrc: Tympanic  Weight: 193 lb (87.5 kg)  Height: 5' (1.524 m)   Physical Exam HENT:     Head: Normocephalic and atraumatic.  Eyes:     Pupils: Pupils are equal, round, and reactive to light.  Cardiovascular:     Rate and Rhythm: Normal rate and regular rhythm.     Heart sounds: Normal heart sounds.  Pulmonary:     Effort: Pulmonary effort is normal.     Breath sounds: Normal breath sounds.  Abdominal:     General: Bowel sounds are normal.     Palpations: Abdomen is soft.  Musculoskeletal:     Cervical back: Normal range of motion.  Skin:  General: Skin is warm and dry.  Neurological:     Mental Status: She is alert and oriented to person, place, and time.      CMP Latest Ref Rng & Units 02/19/2020  Glucose 70 - 99 mg/dL 131(H)  BUN 8 - 23 mg/dL 52(H)  Creatinine 0.44 - 1.00 mg/dL 1.78(H)  Sodium 135 - 145 mmol/L 139  Potassium 3.5 - 5.1 mmol/L 3.7  Chloride 98 - 111 mmol/L 105  CO2 22 - 32 mmol/L 23  Calcium 8.9 - 10.3 mg/dL 8.7(L)  Total Protein 6.5 - 8.1 g/dL 6.6  Total Bilirubin 0.3 - 1.2 mg/dL 0.6  Alkaline Phos 38 - 126 U/L 111  AST 15 - 41 U/L 17  ALT 0 - 44 U/L 15   CBC Latest Ref Rng & Units 02/19/2020  WBC 4.0 - 10.5 K/uL 5.6  Hemoglobin 12.0 - 15.0 g/dL 10.7(L)  Hematocrit 36.0 - 46.0 %  34.4(L)  Platelets 150 - 400 K/uL 207     Assessment and plan- Patient is a 64 y.o. female with pathological prognostic stage Ib invasive lobular carcinoma pT2 pN2 acM0 ER/PR positive HER-2/neu negative s/pleft mastectomy and targeted node dissection.She is here for on treatment assessment prior to cycle 3 of adjuvant TC chemotherapy  Counts okay to proceed with cycle 3 of adjuvant TC chemotherapy today with onpro Neulasta support.  I will see her back in 3 weeks for cycle 4 which will be her last treatment.  Overall patient is tolerating chemotherapy well.  Chemo-induced nausea: I have asked her to start taking Zofran about 48 hours after chemotherapy as needed  Chemo-induced anemia: Hemoglobin stable between 10-11.  Continue to monitor  I will refer her to radiation oncology at this time to have a discussion about adjuvant radiation treatment following completion of chemotherapy.  I will discuss hormone therapy management bisphosphonates with her in greater detail during her next visit   Visit Diagnosis 1. Malignant neoplasm of overlapping sites of left breast in female, estrogen receptor positive ([place])   2. Chronic kidney disease, unspecified CKD stage   3. Encounter for antineoplastic chemotherapy   4. Antineoplastic chemotherapy induced anemia      Dr. Randa Evens, MD, MPH [facility] [phone] 02/19/2020 1:17 PM

## 2020-02-19 NOTE — Progress Notes (Signed)
Per MD to continue with treatment with pt.'s creatinine 1.78 and BUN-52. Treatment team made aware.  Jones Viviani [employer]

## 2020-02-21 ENCOUNTER — Ambulatory Visit: Payer: PPO | Attending: Rheumatology | Admitting: Occupational Therapy

## 2020-02-21 ENCOUNTER — Other Ambulatory Visit: Payer: Self-pay

## 2020-02-21 DIAGNOSIS — I972 Postmastectomy lymphedema syndrome: Secondary | ICD-10-CM | POA: Diagnosis not present

## 2020-02-21 NOTE — Therapy (Signed)
[facility] [address] [place], [address] Phone: [phone]   Fax:  [phone]  Occupational Therapy Treatment  Patient Details  Name: [name] MRN: [id number] Date of Birth: August 06, 1956 Referring Provider (OT): Adela Glimpse Date: 02/21/2020  OT End of Session - 02/21/20 1229    Visit Number  4    Number of Visits  8    Date for OT Re-Evaluation  03/10/20    OT Start Time  1030    OT Stop Time  1108    OT Time Calculation (min)  38 min    Activity Tolerance  Patient tolerated treatment well    Behavior During Therapy  [facility] for tasks assessed/performed       Past Medical History:  Diagnosis Date  . Arthritis of both feet    Gout  . Cancer [facility])    Breast Cancer on left  . Chronic combined systolic and diastolic CHF (congestive heart failure) ([place])    a. Echo (06/2014): Severe LVH, EF 20-25%, no RWMA, Gr 3 DD, trivial MR, mild LAE, mild RVE, PASP 38 mmHg .>> b. Echo 2/16 EF 50-55% //  // c. Echo 02/2019: EF 50-55, mod LVH, Gr 1 DD, mild LAE   . CKD (chronic kidney disease), stage III    now stage 4  . Diabetes mellitus type 2 in obese ([place])   . Diverticular disease   . Dyspnea   . Family history of breast cancer   . Family history of leukemia   . Family history of lung cancer   . Family history of testicular cancer   . Family history of throat cancer   . Fatty liver   . History of GI diverticular bleed   . Hyperlipidemia   . Hyperphosphatemia   . Hypertension   . Hypertensive heart disease    a. Renal Artery Duplex (8/15):  no RAS  . Iron deficiency anemia   . NICM (nonischemic cardiomyopathy) ([place])    a. Nuclear (06/2014): EF 32%, apical thinning, no definite scar or ischemia;  b. Echo (2/16):  Mild LVH, EF 50-55%, Gr 2 DD, no RWMA  . Obesity   . PONV (postoperative nausea and vomiting)    after D and C  . Proteinuria   . PUD (peptic ulcer disease)   . Secondary  hyperparathyroidism, renal ([place])   . Tobacco abuse   . UTI (lower urinary tract infection)   . Vaginal dryness, menopausal   . Vaginal yeast infection     Past Surgical History:  Procedure Laterality Date  . COLONOSCOPY    . DILATION AND CURETTAGE OF UTERUS    . MASTECTOMY W/ SENTINEL NODE BIOPSY Left 11/12/2019  . MASTECTOMY W/ SENTINEL NODE BIOPSY Left 11/12/2019   Procedure: LEFT MASTECTOMY WITH SENTINEL LYMPH NODE BIOPSY AND TARGETED NODE DISSECTION;  Surgeon: Jovita Kussmaul, MD;  Location: [facility];  Service: General;  Laterality: Left;  . None    . PORTA CATH INSERTION N/A 12/11/2019   Procedure: PORTA CATH INSERTION;  Surgeon: Katha Cabal, MD;  Location: [facility];  Service: Cardiovascular;  Laterality: N/A;    There were no vitals filed for this visit.  Subjective Assessment - 02/21/20 1031    Subjective   Scar moving good- no problem - less heaviness, fullness in the arm- no pain - no neuropathy in hands of feet - but I do feel more swollen the week after my  chemo and I had that 2 days ago    Pertinent History  Patient is a 64 y.o. 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  No/denies          LYMPHEDEMA/ONCOLOGY QUESTIONNAIRE - 02/21/20 1032      Right Upper Extremity Lymphedema   15 cm Proximal to Olecranon Process  38 cm    10 cm Proximal to Olecranon Process  35 cm    Olecranon Process  27.4 cm    15 cm Proximal to  Ulnar Styloid Process  25.4 cm    10 cm Proximal to Ulnar Styloid Process  22.2 cm    Just Proximal to Ulnar Styloid Process  17.5 cm    Across Hand at [employer]  19.8 cm    At [place] of 2nd Digit  6.5 cm    At [facility] of Thumb  7.2 cm      Left Upper Extremity Lymphedema   15 cm Proximal to Olecranon Process  40 cm    10 cm Proximal to Olecranon Process  36 cm    Olecranon Process  29 cm    15 cm Proximal to Ulnar Styloid Process  27.5 cm    10 cm Proximal to Ulnar Styloid Process  24.4 cm    Just Proximal to Ulnar Styloid Process  19 cm    Across Hand at [employer]  20 cm    At [place] of 2nd Digit  7 cm    At [facility] of Thumb  7 cm          Pt arrive 2 days after last chemo - L UE circumference increase more than last  2 times - pt report her body feels more swollen the week after chemo  Brought compression garments in this date to assess fit and wearing education  Pt's tribute night time garments - appear sleeve to large - size Large and hand piece is med - over lap to much and cause some numbness  Contact DME company to call and consult about what will be best fit          Wear temporary compression night time with isotoner glove in the meantime Assess fit of daytime Jobst elvarex soft sleeve and glove -appear to be correct fit  But fingers are to tight - pt and son ed on washing  glove -stretch with markers while drying   Hold off on Tribute - will contact DME company to reassess fit and get better option  Will follow up with me in 10 days     OT Education - 02/21/20 1228    Education Details  wearing of compresion garments    Person(s) Educated  Patient    Methods  Explanation;Demonstration;Tactile cues;Verbal cues;Handout    Comprehension  Verbal cues required;Returned demonstration;Verbalized understanding  OT Long Term Goals - 01/14/20 1610      OT LONG TERM GOAL #1   Title  L UE circumference decrease by at least 1 cm at wrist , forearm and  upper arm to get measured for custom copmression sleeve and glove    Baseline  L wrist increase by 1.8 cm , forearm 2 cm , upper arm by 2.5 cm - no compression    Time  4    Period  Weeks    Status  New    Target Date  02/11/20      OT LONG TERM GOAL #2   Title  Pt and family to be independent in wearing of compression garments to decrease circumfernce and maintain lymphedema in L UE    Baseline  no knowledge on garments    Time  8    Period  Weeks    Status  New    Target Date  03/10/20      OT LONG TERM GOAL #3   Title  Pt and son be independent in doing HEP to increase activity tolerance and strengthening without increase lympphedema    Baseline  did not do any exercises for L UE - scar on L chest still have glue on - pt do not want to touch or have anybody look at it - no scar adhesion - pt to wash chest    Time  5    Period  Weeks    Status  New    Target Date  02/18/20            Plan - 02/21/20 1229    Clinical Impression Statement  Pt had chemo 2 days ago -and L UE circumference increase some from last time - pt fitted with daytime compression sleeve Jobst Elvarex soft sleeve and glove - pt to wash glove adn stretch fingers and wear 10 days - will reasses - night time Tribute sleeve appear to large - contacted DME company to reassess fit -pt to wear temporary compression sleeve and glove night time for 10 days - will reasses    OT Occupational Profile and History  Problem Focused Assessment - Including review of records relating to presenting problem    Occupational performance deficits (Please refer to evaluation for details):  ADL's;IADL's;Play;Leisure    Body Structure / Function / Physical Skills  UE functional use;Edema    Rehab Potential  Good    Clinical Decision Making  Limited treatment options, no task modification necessary    Comorbidities Affecting Occupational Performance:  May have comorbidities impacting occupational performance    Modification or  Assistance to Complete Evaluation   No modification of tasks or assist necessary to complete eval    OT Frequency  Biweekly    OT Duration  4 weeks    OT Treatment/Interventions  Self-care/ADL training;Manual lymph drainage;Patient/family education;Compression bandaging;Scar mobilization;Manual Therapy;Therapeutic exercise    Plan  reassess in 10 days fit adn wearing of daytime sleeve - consult with DME company about night time sleeve    OT Home Exercise Plan  see pt instruction    Consulted and Agree with Plan of Care  Patient       Patient will benefit from skilled therapeutic intervention in order to improve the following deficits and impairments:   Body Structure / Function / Physical Skills: UE functional use, Edema       Visit Diagnosis: Postmastectomy lymphedema syndrome    Problem List Patient Active Problem List  Diagnosis Date Noted  . Cancer of left female breast  ([place]) 11/12/2019  . Goals of care, counseling/discussion 10/14/2019  . Family history of breast cancer   . Family history of testicular cancer   . Family history of lung cancer   . Family history of throat cancer   . Family history of leukemia   . Malignant neoplasm of overlapping sites of left breast in female, estrogen receptor positive ([place]) 10/09/2019  . Pain in right foot 10/24/2018  . Type 2 diabetes mellitus with stage 3 chronic kidney disease, with long-term current use of insulin ([place]) 01/31/2018  . Pancreatitis, acute 12/06/2015  . ARF (acute renal failure) ([place]) 12/05/2015  . Rectal bleeding 12/05/2015  . Elevated troponin 12/05/2015  . Abdominal distention   . DKA (diabetic ketoacidoses) ([place]) 12/04/2015  . Rash 03/26/2015  . Elevated alkaline phosphatase level 03/26/2015  . NICM (nonischemic cardiomyopathy) ([place]) 08/12/2014  . Chronic diastolic CHF (congestive heart failure) ([place]) 07/07/2014  . NSTEMI (non-ST elevated myocardial infarction) ([place]) 07/07/2014  . Hypertensive heart disease  with CHF (congestive heart failure) ([place]) 07/07/2014  . CKD (chronic kidney disease) stage 3, GFR 30-59 ml/min ([facility]) 07/07/2014  . Non compliance w medication regimen secondary to "allergies" and cost 07/07/2014  . PUD (peptic ulcer disease)- endoscopy 2012 07/07/2014  . GI bleed 10/01/2012  . Diverticulitis- lower GI bleed 2013 10/01/2012    Rosalyn Gess OTR/L,CLT 02/21/2020, 12:32 PM  [facility] [address] [place], [address] Phone: [phone]   Fax:  [phone]  Name: [name] MRN: [id number] Date of Birth: 1956-04-08

## 2020-02-21 NOTE — Patient Instructions (Signed)
Wear temporary compression night time with isotoner glove  And daytime Jobst elvarex soft sleeve and glove -and wash glove it fingers are to tight -stretch with markers  Hold off on Lelan Pons - will contact DME company to reassess fit and get better fit

## 2020-02-27 ENCOUNTER — Ambulatory Visit
Admission: RE | Admit: 2020-02-27 | Discharge: 2020-02-27 | Disposition: A | Discharge status: Home / Self Care | Payer: PPO | Source: Ambulatory Visit | Attending: Radiation Oncology | Admitting: Radiation Oncology

## 2020-02-27 ENCOUNTER — Encounter: Payer: Self-pay | Admitting: Radiation Oncology

## 2020-02-27 ENCOUNTER — Other Ambulatory Visit: Payer: Self-pay

## 2020-02-27 VITALS — BP 125/67 | HR 86 | Temp 97.4°F | Resp 20 | Wt 192.8 lb

## 2020-02-27 DIAGNOSIS — Z79899 Other long term (current) drug therapy: Secondary | ICD-10-CM | POA: Insufficient documentation

## 2020-02-27 DIAGNOSIS — N183 Chronic kidney disease, stage 3 unspecified: Secondary | ICD-10-CM | POA: Insufficient documentation

## 2020-02-27 DIAGNOSIS — D509 Iron deficiency anemia, unspecified: Secondary | ICD-10-CM | POA: Diagnosis not present

## 2020-02-27 DIAGNOSIS — C50812 Malignant neoplasm of overlapping sites of left female breast: Secondary | ICD-10-CM | POA: Diagnosis not present

## 2020-02-27 DIAGNOSIS — Z7982 Long term (current) use of aspirin: Secondary | ICD-10-CM | POA: Insufficient documentation

## 2020-02-27 DIAGNOSIS — Z87891 Personal history of nicotine dependence: Secondary | ICD-10-CM | POA: Diagnosis not present

## 2020-02-27 DIAGNOSIS — Z801 Family history of malignant neoplasm of trachea, bronchus and lung: Secondary | ICD-10-CM | POA: Insufficient documentation

## 2020-02-27 DIAGNOSIS — Z170 Estrogen receptor positive status [ER+]: Secondary | ICD-10-CM | POA: Insufficient documentation

## 2020-02-27 DIAGNOSIS — I429 Cardiomyopathy, unspecified: Secondary | ICD-10-CM | POA: Diagnosis not present

## 2020-02-27 DIAGNOSIS — Z8711 Personal history of peptic ulcer disease: Secondary | ICD-10-CM | POA: Diagnosis not present

## 2020-02-27 DIAGNOSIS — E785 Hyperlipidemia, unspecified: Secondary | ICD-10-CM | POA: Diagnosis not present

## 2020-02-27 DIAGNOSIS — I130 Hypertensive heart and chronic kidney disease with heart failure and stage 1 through stage 4 chronic kidney disease, or unspecified chronic kidney disease: Secondary | ICD-10-CM | POA: Diagnosis not present

## 2020-02-27 DIAGNOSIS — Z803 Family history of malignant neoplasm of breast: Secondary | ICD-10-CM | POA: Diagnosis not present

## 2020-02-27 DIAGNOSIS — E1122 Type 2 diabetes mellitus with diabetic chronic kidney disease: Secondary | ICD-10-CM | POA: Insufficient documentation

## 2020-02-27 DIAGNOSIS — Z9012 Acquired absence of left breast and nipple: Secondary | ICD-10-CM | POA: Insufficient documentation

## 2020-02-27 DIAGNOSIS — Z794 Long term (current) use of insulin: Secondary | ICD-10-CM | POA: Insufficient documentation

## 2020-02-27 DIAGNOSIS — R110 Nausea: Secondary | ICD-10-CM | POA: Diagnosis not present

## 2020-02-27 DIAGNOSIS — N2581 Secondary hyperparathyroidism of renal origin: Secondary | ICD-10-CM | POA: Insufficient documentation

## 2020-02-27 DIAGNOSIS — I5042 Chronic combined systolic (congestive) and diastolic (congestive) heart failure: Secondary | ICD-10-CM | POA: Insufficient documentation

## 2020-02-27 NOTE — Consult Note (Signed)
NEW PATIENT EVALUATION  Name: [name]  MRN: [id number]  Date:   02/27/2020     DOB: [birth year]   This 64 y.o. female patient presents to the clinic for initial evaluation of stage Ib (pT2 N2 M0) ER/PR positive HER-2 negative invasive lobular carcinoma of the left breast status post modified radical mastectomy and targeted node dissection currently undergoing TC chemotherapy.  REFERRING PHYSICIAN: [name], MD  CHIEF COMPLAINT:  Chief Complaint  Patient presents with  . Breast Cancer    DIAGNOSIS: The encounter diagnosis was Malignant neoplasm of overlapping sites of left breast in female, estrogen receptor positive ([place]).   PREVIOUS INVESTIGATIONS:  Mammogram and ultrasound reviewed PET CT scan reviewed Pathology report reviewed Clinical notes reviewed  HPI: Patient is a 64 year old female who had not undergone mammogram for several years and was found in October 2020 to have a palpable area concern in the left breast.  Mammogram demonstrated a 3.1 x 2.4 x 4.5 cm irregular mass concerning for malignancy.  Also had a single abnormal lymph node left axilla with cortical thickness of 0.7 cm.  Both areas were biopsied and positive for invasive mammary carcinoma with lobular features grade 2 ER strongly positive.  Tumor was HER-2/neu negative PR was also strongly positive.  PET CT scan demonstrated hypermetabolic left axillary lymph nodes at least 3-4.  Also there was a retroareolar left breast mass 2.5 x 3.2 cm.  No evidence of distant metastatic disease was noted.  There are also retroperitoneal lymph nodes slightly hypermetabolic which will be followed in the future.  MRI scan demonstrated 2 sites of biopsy-proven malignancy in the lateral left breast and morphologically abnormal level 1 axillary lymph node consistent with metastatic disease.  She underwent a left modified radical mastectomy showing a 4.5 cm mass grade 2 invasive mammary carcinoma with lobular features ER/PR positive  HER-2 negative.  5 out of 11 lymph nodes were positive for malignancy with extranodal extension positive.  Largest metastatic deposit was 1.4 cm.  She has been started on TC chemotherapy not an anthracycline-based candidate based on her cardiac status.  She has been having some severe nausea associated with her chemotherapy although tolerating otherwise well.  She has 1 more chemotherapy treatment in 3 weeks.  Patient has multiple Medical comorbidities including CKD adult onset diabetes cardiomyopathy and hypertension.  She is now referred to radiation oncology for opinion.  She states she is healed well having good mobility of her left upper extremity with no obvious evidence of lymphedema.  PLANNED TREATMENT REGIMEN: Left chest wall and peripheral emphatic radiation  PAST MEDICAL HISTORY:  has a past medical history of Arthritis of both feet, Cancer ([place]), Chronic combined systolic and diastolic CHF (congestive heart failure) ([place]), CKD (chronic kidney disease), stage III, Diabetes mellitus type 2 in obese ([place]), Diverticular disease, Dyspnea, Family history of breast cancer, Family history of leukemia, Family history of lung cancer, Family history of testicular cancer, Family history of throat cancer, Fatty liver, History of GI diverticular bleed, Hyperlipidemia, Hyperphosphatemia, Hypertension, Hypertensive heart disease, Iron deficiency anemia, NICM (nonischemic cardiomyopathy) ([place]), Obesity, PONV (postoperative nausea and vomiting), Proteinuria, PUD (peptic ulcer disease), Secondary hyperparathyroidism, renal ([place]), Tobacco abuse, UTI (lower urinary tract infection), Vaginal dryness, menopausal, and Vaginal yeast infection.    PAST SURGICAL HISTORY:  Past Surgical History:  Procedure Laterality Date  . COLONOSCOPY    . DILATION AND CURETTAGE OF UTERUS    . MASTECTOMY W/ SENTINEL NODE BIOPSY Left 11/12/2019  . MASTECTOMY W/ SENTINEL  NODE BIOPSY Left 11/12/2019   Procedure: LEFT MASTECTOMY WITH  SENTINEL LYMPH NODE BIOPSY AND TARGETED NODE DISSECTION;  Surgeon: Jovita Kussmaul, MD;  Location: [facility];  Service: General;  Laterality: Left;  . None    . PORTA CATH INSERTION N/A 12/11/2019   Procedure: PORTA CATH INSERTION;  Surgeon: Katha Cabal, MD;  Location: [facility];  Service: Cardiovascular;  Laterality: N/A;    FAMILY HISTORY: family history includes Breast cancer in her sister; Coronary artery disease in an other family member; Diabetes in her mother; Heart attack in her paternal grandfather; Hypertension in her paternal grandmother and another family member; Kidney disease in her mother; Leukemia in her brother; Lung cancer in her paternal uncle; Pneumonia (age of onset: 2) in her sister; Stroke in her paternal grandmother; Testicular cancer in her father; Throat cancer in her brother.  SOCIAL HISTORY:  reports that she quit smoking about 5 years ago. She has never used smokeless tobacco. She reports that she does not drink alcohol or use drugs.  ALLERGIES: Hydralazine and Other  MEDICATIONS:  Current Outpatient Medications  Medication Sig Dispense Refill  . acetaminophen (TYLENOL) 500 MG tablet Take 1,000 mg by mouth every 6 (six) hours as needed for mild pain, moderate pain or headache. For pain    . allopurinol (ZYLOPRIM) 100 MG tablet Take 200 mg by mouth daily.    Marland Kitchen amLODipine (NORVASC) 5 MG tablet Take 1 tablet (5 mg total) by mouth daily. (Patient taking differently: Take 5 mg by mouth at bedtime. ) 90 tablet 3  . aspirin EC 81 MG EC tablet Take 1 tablet (81 mg total) by mouth daily.    Marland Kitchen atorvastatin (LIPITOR) 40 MG tablet Take 1 tablet (40 mg total) by mouth daily at 6 PM. 90 tablet 3  . B-D ULTRAFINE III SHORT PEN 31G X 8 MM MISC USE AS DIRECTED *EMERGENCY FILL*  0  . blood glucose meter kit and supplies KIT Dispense based on patient and insurance preference. Use up to four times daily as directed. (FOR ICD-9 250.00, 250.01). 1 each 0  . calcitRIOL  (ROCALTROL) 0.25 MCG capsule Take 0.25 mcg by mouth daily.    . cloNIDine (CATAPRES) 0.1 MG tablet Take 1 tablet (0.1 mg total) by mouth 2 (two) times daily. 180 tablet 3  . Colchicine 0.6 MG CAPS Take 0.6 mg by mouth every other day.     . furosemide (LASIX) 40 MG tablet Take 40 mg by mouth 2 (two) times daily.    Marland Kitchen gabapentin (NEURONTIN) 100 MG capsule Take 100 mg by mouth 3 (three) times daily.    . insulin aspart (NOVOLOG FLEXPEN) 100 UNIT/ML FlexPen Inject 5 Units into the skin 3 (three) times daily with meals. Additional insulin (5 units PLUS) CBG 70 - 120: 0 units CBG 121 - 150: 1 unit CBG 151 - 200: 2 units CBG 201 - 250: 3 units CBG 251 - 350: 4 units CBG 351 - 400: 6 units (Patient taking differently: Inject 5 Units into the skin 3 (three) times daily with meals. Sliding scale  5 units/ plus base) 15 mL 11  . insulin glargine (LANTUS) 100 UNIT/ML injection Inject 20 Units into the skin at bedtime.     . isosorbide mononitrate (IMDUR) 120 MG 24 hr tablet TAKE 1/2 TABLET DAILY = 60 MG DAILY (Patient taking differently: Take 60 mg by mouth daily. ) 45 tablet 3  . lidocaine-prilocaine (EMLA) cream Apply to affected area once 30 g  3  . loratadine (CLARITIN) 10 MG tablet Take 10 mg by mouth daily as needed for allergies (around onpro neulasta with treatment).    . LORazepam (ATIVAN) 0.5 MG tablet Take 1 tablet (0.5 mg total) by mouth every 6 (six) hours as needed (Nausea or vomiting). 30 tablet 0  . Menthol, Topical Analgesic, (ICY HOT EX) Apply 1 application topically daily as needed (pain).    . metoprolol tartrate (LOPRESSOR) 25 MG tablet Take 1 tablet (25 mg total) by mouth 2 (two) times daily. 180 tablet 3  . ondansetron (ZOFRAN) 8 MG tablet Take 1 tablet (8 mg total) by mouth 2 (two) times daily as needed for refractory nausea / vomiting. Start on day 3 after chemo. 30 tablet 1  . ONE TOUCH ULTRA TEST test strip 1 each by Other route 4 (four) times daily.   1  . ONETOUCH DELICA  LANCETS 80X MISC Inject 33 g into the skin 4 (four) times daily.   1  . polyethylene glycol (MIRALAX / GLYCOLAX) 17 g packet Take 17 g by mouth daily as needed for moderate constipation.    . prochlorperazine (COMPAZINE) 10 MG tablet Take 1 tablet (10 mg total) by mouth every 6 (six) hours as needed (Nausea or vomiting). 30 tablet 1  . telmisartan (MICARDIS) 20 MG tablet Take 1 tablet (20 mg total) by mouth daily. (Patient taking differently: Take 20 mg by mouth at bedtime. ) 90 tablet 3  . triamcinolone ointment (KENALOG) 0.5 % Apply 1 application topically 3 (three) times daily. 30 g 2   No current facility-administered medications for this encounter.    ECOG PERFORMANCE STATUS:  0 - Asymptomatic  REVIEW OF SYSTEMS: Patient denies any weight loss, fatigue, weakness, fever, chills or night sweats. Patient denies any loss of vision, blurred vision. Patient denies any ringing  of the ears or hearing loss. No irregular heartbeat. Patient denies heart murmur or history of fainting. Patient denies any chest pain or pain radiating to her upper extremities. Patient denies any shortness of breath, difficulty breathing at night, cough or hemoptysis. Patient denies any swelling in the lower legs. Patient denies any nausea vomiting, vomiting of blood, or coffee ground material in the vomitus. Patient denies any stomach pain. Patient states has had normal bowel movements no significant constipation or diarrhea. Patient denies any dysuria, hematuria or significant nocturia. Patient denies any problems walking, swelling in the joints or loss of balance. Patient denies any skin changes, loss of hair or loss of weight. Patient denies any excessive worrying or anxiety or significant depression. Patient denies any problems with insomnia. Patient denies excessive thirst, polyuria, polydipsia. Patient denies any swollen glands, patient denies easy bruising or easy bleeding. Patient denies any recent infections, allergies  or URI. Patient "s visual fields have not changed significantly in recent time.   PHYSICAL EXAM: BP 125/67   Pulse 86   Temp (!) 97.4 F (36.3 C)   Resp 20   Wt 192 lb 12.8 oz (87.5 kg)   BMI 37.65 kg/m  Patient is status post left modified radical mastectomy.  Right breast is free of dominant mass or nodularity in 2 positions examined.  No axillary or supraclavicular adenopathy is identified.  Patient is morbidly obese.  Well-developed well-nourished patient in NAD. HEENT reveals PERLA, EOMI, discs not visualized.  Oral cavity is clear. No oral mucosal lesions are identified. Neck is clear without evidence of cervical or supraclavicular adenopathy. Lungs are clear to A&P. Cardiac examination is essentially unremarkable  with regular rate and rhythm without murmur rub or thrill. Abdomen is benign with no organomegaly or masses noted. Motor sensory and DTR levels are equal and symmetric in the upper and lower extremities. Cranial nerves II through XII are grossly intact. Proprioception is intact. No peripheral adenopathy or edema is identified. No motor or sensory levels are noted. Crude visual fields are within normal range.  LABORATORY DATA: Pathology report reviewed    RADIOLOGY RESULTS: Mammograms ultrasound PET CT scan and MRI scans all reviewed compatible with above-stated findings   IMPRESSION: Stage Ib invasive mammary carcinoma with lobular features of the left breast status post modified radical mastectomy and adjuvant TC chemotherapy tumor is ER/PR positive HER-2 negative in 64 year old female  PLAN: At this time will wait for the completion of chemotherapy.  I have recommended left chest wall and peripheral emphatic radiation with T both sites to 5040 cGy 28 fractions.  Would also boost her scar another 1600 cGy using electron beam.  Risks and benefits of treatment including slight chance of lymphedema left upper extremity fatigue alteration of blood count skin reaction possible  inclusion of superficial lung all were reviewed with the patient in detail.  She seems to comprehend my treatment plan well.  Patient also would be a candidate for antiestrogen therapy after completion of radiation.  Patient's son accompanied her for her appointment today.  They both comprehend my treatment plan well.  I would like to take this opportunity to thank you for allowing me to participate in the care of your patient.Noreene Filbert, MD

## 2020-02-28 ENCOUNTER — Ambulatory Visit (HOSPITAL_COMMUNITY): Payer: PPO | Attending: Internal Medicine

## 2020-02-28 DIAGNOSIS — I5032 Chronic diastolic (congestive) heart failure: Secondary | ICD-10-CM | POA: Diagnosis not present

## 2020-02-29 ENCOUNTER — Encounter: Payer: Self-pay | Admitting: Physician Assistant

## 2020-03-04 ENCOUNTER — Ambulatory Visit: Payer: PPO | Admitting: Occupational Therapy

## 2020-03-06 IMAGING — MG MM BREAST SURGICAL SPECIMEN
1 series · 1 of 1 positions shown · non-contrast
Comparison: Previous exam(s).

CLINICAL DATA: Biopsy proven left axillary metastasis. Status post
surgical excision.

EXAM:
SPECIMEN RADIOGRAPH OF THE LEFT AXILLA

[L]
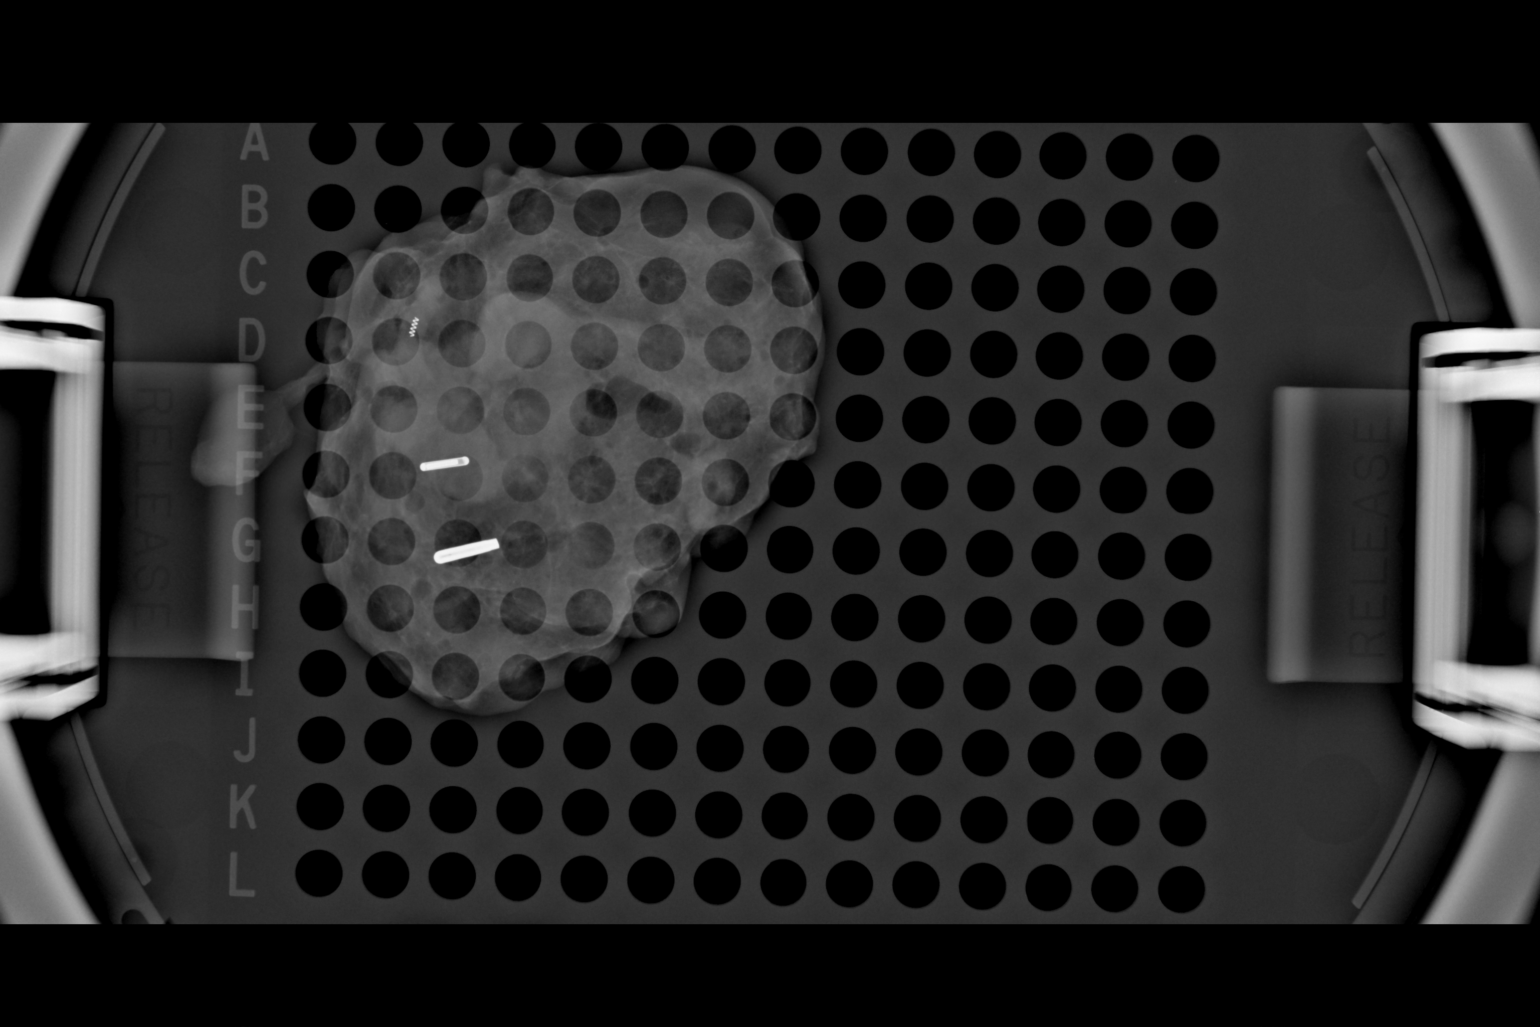

[1 of 1 positions shown; findings below may reference images not displayed]

FINDINGS: Status post excision of the left axilla. The radioactive seed and
biopsy marker clip are present, completely intact, and were marked
for pathology.
IMPRESSION: Specimen radiograph of the left axilla.

## 2020-03-10 ENCOUNTER — Other Ambulatory Visit: Payer: Self-pay

## 2020-03-10 ENCOUNTER — Ambulatory Visit: Payer: PPO | Admitting: Occupational Therapy

## 2020-03-10 DIAGNOSIS — I972 Postmastectomy lymphedema syndrome: Secondary | ICD-10-CM | POA: Diagnosis not present

## 2020-03-10 NOTE — Patient Instructions (Signed)
See note

## 2020-03-10 NOTE — Therapy (Signed)
[facility] [address] [place], [address] Phone: [phone]   Fax:  [phone]  Occupational Therapy Treatment  Patient Details  Name: [name] MRN: [id number] Date of Birth: 30-Sep-1956 Referring Provider (OT): Adela Glimpse Date: 03/10/2020  OT End of Session - 03/10/20 1508    Visit Number  5    Number of Visits  8    Date for OT Re-Evaluation  03/10/20    OT Start Time  0938    OT Stop Time  1506    OT Time Calculation (min)  24 min    Activity Tolerance  Patient tolerated treatment well    Behavior During Therapy  [facility] for tasks assessed/performed       Past Medical History:  Diagnosis Date  . Arthritis of both feet    Gout  . Cancer [facility])    Breast Cancer on left  . Chronic combined systolic and diastolic CHF (congestive heart failure) ([place])    a. Echo (06/2014): Severe LVH, EF 20-25%, no RWMA, Gr 3 DD, trivial MR, mild LAE, mild RVE, PASP 38 mmHg .>> b. Echo 2/16 EF 50-55% //  // c. Echo 02/2019: EF 50-55, mod LVH, Gr 1 DD, mild LAE // Echocardiogram 02/2020: EF 60-65, no RWMA, mild LVH, Gr 1 DD, normal RVSF, RVSP 30.5   . CKD (chronic kidney disease), stage III    now stage 4  . Diabetes mellitus type 2 in obese ([place])   . Diverticular disease   . Dyspnea   . Family history of breast cancer   . Family history of leukemia   . Family history of lung cancer   . Family history of testicular cancer   . Family history of throat cancer   . Fatty liver   . History of GI diverticular bleed   . Hyperlipidemia   . Hyperphosphatemia   . Hypertension   . Hypertensive heart disease    a. Renal Artery Duplex (8/15):  no RAS  . Iron deficiency anemia   . NICM (nonischemic cardiomyopathy) ([place])    a. Nuclear (06/2014): EF 32%, apical thinning, no definite scar or ischemia;  b. Echo (2/16):  Mild LVH, EF 50-55%, Gr 2 DD, no RWMA  . Obesity   . PONV (postoperative nausea and vomiting)    after D  and C  . Proteinuria   . PUD (peptic ulcer disease)   . Secondary hyperparathyroidism, renal ([place])   . Tobacco abuse   . UTI (lower urinary tract infection)   . Vaginal dryness, menopausal   . Vaginal yeast infection     Past Surgical History:  Procedure Laterality Date  . COLONOSCOPY    . DILATION AND CURETTAGE OF UTERUS    . MASTECTOMY W/ SENTINEL NODE BIOPSY Left 11/12/2019  . MASTECTOMY W/ SENTINEL NODE BIOPSY Left 11/12/2019   Procedure: LEFT MASTECTOMY WITH SENTINEL LYMPH NODE BIOPSY AND TARGETED NODE DISSECTION;  Surgeon: Jovita Kussmaul, MD;  Location: [facility];  Service: General;  Laterality: Left;  . None    . PORTA CATH INSERTION N/A 12/11/2019   Procedure: PORTA CATH INSERTION;  Surgeon: Katha Cabal, MD;  Location: [facility];  Service: Cardiovascular;  Laterality: N/A;    There were no vitals filed for this visit.  Subjective Assessment - 03/10/20 1506    Subjective   I tried to sleep with the night time but can only do 1/2 of the night  -  the hand piece just to tight - but this glove works -At [employer] he said the hand piece and sleeve is the correct size and the best they can do - they order me a new day glove    Pertinent History  Patient is a 64 y.o. 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  No/denies          LYMPHEDEMA/ONCOLOGY QUESTIONNAIRE - 03/10/20 1447      Left Upper Extremity Lymphedema   15 cm Proximal to  Olecranon Process  39.8 cm    10 cm Proximal to Olecranon Process  36 cm    Olecranon Process  28 cm    15 cm Proximal to Ulnar Styloid Process  27.5 cm    10 cm Proximal to Ulnar Styloid Process  24 cm    Just Proximal to Ulnar Styloid Process  19 cm    Across Hand at [employer]  20 cm    At [place] of 2nd Digit  7 cm    At [facility] of Thumb  6.8 cm        Pt arrive after being seen last week by DME company for assessment of night time Lelan Pons- ed her and son on wearing - she report they said that is the best fit they can get  She report wearing sleeve at night time for about 4 hrs and isotoner glove- her circumference doing well  Pt ed on fastening Velcro appropriate and using the power sleeve - she did not use that  Explain to her and she verbalized understanding  He arrive with her [employer] soft sleeve - they are ordering her new compression glove - other one was to tight  she is wearing Isotoner glove and provided her another one to wear  Pt to call if any issues with new glove  Other wise will follow up with her 2 wks after starting radiation  Pt and son in agreement -               OT Education - 03/10/20 1508    Education Details  wearing of compresion garments    Person(s) Educated  Patient    Methods  Explanation;Demonstration;Tactile cues;Verbal cues;Handout    Comprehension  Verbal cues required;Returned demonstration;Verbalized understanding          OT Long Term Goals - 01/14/20 1610      OT LONG TERM GOAL #1   Title  L UE circumference decrease by at least 1 cm at wrist , forearm and upper arm to get measured for custom copmression sleeve and glove    Baseline  L wrist increase by 1.8 cm , forearm 2 cm , upper arm by 2.5 cm - no compression    Time  4    Period  Weeks    Status  New    Target  Date  02/11/20      OT LONG TERM GOAL #2   Title  Pt and family to be independent in wearing of compression garments to decrease circumfernce and  maintain lymphedema in L UE    Baseline  no knowledge on garments    Time  8    Period  Weeks    Status  New    Target Date  03/10/20      OT LONG TERM GOAL #3   Title  Pt and son be independent in doing HEP to increase activity tolerance and strengthening without increase lympphedema    Baseline  did not do any exercises for L UE - scar on L chest still have glue on - pt do not want to touch or have anybody look at it - no scar adhesion - pt to wash chest    Time  5    Period  Weeks    Status  New    Target Date  02/18/20            Plan - 03/10/20 1509    Clinical Impression Statement  Pt arrive with daytime [employer] soft sleeve on and isotoner glove-  DME is ordering her new glove- she report DME said night time sleeve and glove was the best fit it can get- pt ed on wearing sleeve part correctly with power sleeve and then isotoner glove - and will reassess in 4 wks - or she'll call when her daytime compression glove comes in    OT Occupational Profile and History  Problem Focused Assessment - Including review of records relating to presenting problem    Occupational performance deficits (Please refer to evaluation for details):  ADL's;IADL's;Play;Leisure    Body Structure / Function / Physical Skills  UE functional use;Edema    Rehab Potential  Good    Clinical Decision Making  Limited treatment options, no task modification necessary    Comorbidities Affecting Occupational Performance:  May have comorbidities impacting occupational performance    Modification or Assistance to Complete Evaluation   No modification of tasks or assist necessary to complete eval    OT Frequency  Monthly    OT Duration  4 weeks    OT Treatment/Interventions  Self-care/ADL training;Manual lymph drainage;Patient/family education;Compression bandaging;Scar mobilization;Manual Therapy;Therapeutic exercise    Plan  call if problems with replacement glove- otherwise will see 2 wks into radiation     OT Home Exercise Plan  see pt instruction    Consulted and Agree with Plan of Care  Patient       Patient will benefit from skilled therapeutic intervention in order to improve the following deficits and impairments:   Body Structure / Function / Physical Skills: UE functional use, Edema       Visit Diagnosis: Postmastectomy lymphedema syndrome    Problem List Patient Active Problem List   Diagnosis Date Noted  . Cancer of left female breast  ([place]) 11/12/2019  . Goals of care, counseling/discussion 10/14/2019  . Family history of breast cancer   . Family history of testicular cancer   . Family history of lung cancer   . Family history of throat cancer   . Family history of leukemia   . Malignant neoplasm of overlapping sites of left breast in female, estrogen receptor positive ([place]) 10/09/2019  . Pain in right foot 10/24/2018  . Type 2 diabetes mellitus with stage 3 chronic kidney disease, with long-term current use of insulin ([place]) 01/31/2018  .  Pancreatitis, acute 12/06/2015  . ARF (acute renal failure) ([place]) 12/05/2015  . Rectal bleeding 12/05/2015  . Elevated troponin 12/05/2015  . Abdominal distention   . DKA (diabetic ketoacidoses) ([place]) 12/04/2015  . Rash 03/26/2015  . Elevated alkaline phosphatase level 03/26/2015  . NICM (nonischemic cardiomyopathy) ([place]) 08/12/2014  . Chronic diastolic CHF (congestive heart failure) ([place]) 07/07/2014  . NSTEMI (non-ST elevated myocardial infarction) ([place]) 07/07/2014  . Hypertensive heart disease with CHF (congestive heart failure) ([place]) 07/07/2014  . CKD (chronic kidney disease) stage 3, GFR 30-59 ml/min ([facility]) 07/07/2014  . Non compliance w medication regimen secondary to "allergies" and cost 07/07/2014  . PUD (peptic ulcer disease)- endoscopy 2012 07/07/2014  . GI bleed 10/01/2012  . Diverticulitis- lower GI bleed 2013 10/01/2012    Rosalyn Gess OTR/l,CLT  03/10/2020, 5:25 PM  [facility] [address] [place], [address] Phone: [phone]   Fax:  [phone]  Name: [name] MRN: [id number] Date of Birth: January 30, 1956

## 2020-03-11 ENCOUNTER — Inpatient Hospital Stay: Payer: PPO

## 2020-03-11 ENCOUNTER — Encounter: Payer: Self-pay | Admitting: Oncology

## 2020-03-11 ENCOUNTER — Inpatient Hospital Stay: Payer: PPO | Attending: Oncology | Admitting: Oncology

## 2020-03-11 VITALS — BP 110/67 | HR 73 | Temp 96.8°F | Wt 192.1 lb

## 2020-03-11 DIAGNOSIS — Z95828 Presence of other vascular implants and grafts: Secondary | ICD-10-CM

## 2020-03-11 DIAGNOSIS — Z5111 Encounter for antineoplastic chemotherapy: Secondary | ICD-10-CM

## 2020-03-11 DIAGNOSIS — Z170 Estrogen receptor positive status [ER+]: Secondary | ICD-10-CM

## 2020-03-11 DIAGNOSIS — N179 Acute kidney failure, unspecified: Secondary | ICD-10-CM | POA: Diagnosis not present

## 2020-03-11 DIAGNOSIS — E119 Type 2 diabetes mellitus without complications: Secondary | ICD-10-CM | POA: Diagnosis not present

## 2020-03-11 DIAGNOSIS — T451X5A Adverse effect of antineoplastic and immunosuppressive drugs, initial encounter: Secondary | ICD-10-CM

## 2020-03-11 DIAGNOSIS — Z833 Family history of diabetes mellitus: Secondary | ICD-10-CM | POA: Diagnosis not present

## 2020-03-11 DIAGNOSIS — Z8043 Family history of malignant neoplasm of testis: Secondary | ICD-10-CM | POA: Diagnosis not present

## 2020-03-11 DIAGNOSIS — C50812 Malignant neoplasm of overlapping sites of left female breast: Secondary | ICD-10-CM | POA: Diagnosis not present

## 2020-03-11 DIAGNOSIS — Z801 Family history of malignant neoplasm of trachea, bronchus and lung: Secondary | ICD-10-CM | POA: Insufficient documentation

## 2020-03-11 DIAGNOSIS — Z794 Long term (current) use of insulin: Secondary | ICD-10-CM | POA: Insufficient documentation

## 2020-03-11 DIAGNOSIS — R7989 Other specified abnormal findings of blood chemistry: Secondary | ICD-10-CM

## 2020-03-11 DIAGNOSIS — Z8249 Family history of ischemic heart disease and other diseases of the circulatory system: Secondary | ICD-10-CM | POA: Diagnosis not present

## 2020-03-11 DIAGNOSIS — E785 Hyperlipidemia, unspecified: Secondary | ICD-10-CM | POA: Insufficient documentation

## 2020-03-11 DIAGNOSIS — I10 Essential (primary) hypertension: Secondary | ICD-10-CM | POA: Diagnosis not present

## 2020-03-11 DIAGNOSIS — Z803 Family history of malignant neoplasm of breast: Secondary | ICD-10-CM | POA: Diagnosis not present

## 2020-03-11 DIAGNOSIS — Z806 Family history of leukemia: Secondary | ICD-10-CM | POA: Diagnosis not present

## 2020-03-11 DIAGNOSIS — Z79899 Other long term (current) drug therapy: Secondary | ICD-10-CM | POA: Insufficient documentation

## 2020-03-11 DIAGNOSIS — Z9012 Acquired absence of left breast and nipple: Secondary | ICD-10-CM | POA: Insufficient documentation

## 2020-03-11 DIAGNOSIS — Z7982 Long term (current) use of aspirin: Secondary | ICD-10-CM | POA: Diagnosis not present

## 2020-03-11 DIAGNOSIS — G620 Drug-induced polyneuropathy: Secondary | ICD-10-CM | POA: Insufficient documentation

## 2020-03-11 DIAGNOSIS — Z87891 Personal history of nicotine dependence: Secondary | ICD-10-CM | POA: Insufficient documentation

## 2020-03-11 LAB — CBC WITH DIFFERENTIAL/PLATELET
Abs Immature Granulocytes: 0.02 10*3/uL (ref 0.00–0.07)
Basophils Absolute: 0.1 10*3/uL (ref 0.0–0.1)
Basophils Relative: 2 %
Eosinophils Absolute: 0.1 10*3/uL (ref 0.0–0.5)
Eosinophils Relative: 1 %
HCT: 32.4 % — ABNORMAL LOW (ref 36.0–46.0)
Hemoglobin: 10.4 g/dL — ABNORMAL LOW (ref 12.0–15.0)
Immature Granulocytes: 0 %
Lymphocytes Relative: 6 %
Lymphs Abs: 0.5 10*3/uL — ABNORMAL LOW (ref 0.7–4.0)
MCH: 28.9 pg (ref 26.0–34.0)
MCHC: 32.1 g/dL (ref 30.0–36.0)
MCV: 90 fL (ref 80.0–100.0)
Monocytes Absolute: 0.7 10*3/uL (ref 0.1–1.0)
Monocytes Relative: 10 %
Neutro Abs: 5.6 10*3/uL (ref 1.7–7.7)
Neutrophils Relative %: 81 %
Platelets: 202 10*3/uL (ref 150–400)
RBC: 3.6 MIL/uL — ABNORMAL LOW (ref 3.87–5.11)
RDW: 21.4 % — ABNORMAL HIGH (ref 11.5–15.5)
WBC: 7 10*3/uL (ref 4.0–10.5)
nRBC: 0 % (ref 0.0–0.2)

## 2020-03-11 LAB — COMPREHENSIVE METABOLIC PANEL
ALT: 15 U/L (ref 0–44)
AST: 16 U/L (ref 15–41)
Albumin: 3.7 g/dL (ref 3.5–5.0)
Alkaline Phosphatase: 111 U/L (ref 38–126)
Anion gap: 10 (ref 5–15)
BUN: 51 mg/dL — ABNORMAL HIGH (ref 8–23)
CO2: 24 mmol/L (ref 22–32)
Calcium: 8.5 mg/dL — ABNORMAL LOW (ref 8.9–10.3)
Chloride: 102 mmol/L (ref 98–111)
Creatinine, Ser: 2.03 mg/dL — ABNORMAL HIGH (ref 0.44–1.00)
GFR calc Af Amer: 29 mL/min — ABNORMAL LOW (ref >60)
GFR calc non Af Amer: 25 mL/min — ABNORMAL LOW (ref >60)
Glucose, Bld: 165 mg/dL — ABNORMAL HIGH (ref 70–99)
Potassium: 3.9 mmol/L (ref 3.5–5.1)
Sodium: 136 mmol/L (ref 135–145)
Total Bilirubin: 0.7 mg/dL (ref 0.3–1.2)
Total Protein: 6.6 g/dL (ref 6.5–8.1)

## 2020-03-11 MED ORDER — GABAPENTIN 100 MG PO CAPS: 200.0000 mg | ORAL_CAPSULE | Freq: Three times a day (TID) | ORAL | 1 refills | Status: DC

## 2020-03-11 MED ORDER — SODIUM CHLORIDE 0.9% FLUSH
10.0000 mL | Freq: Once | INTRAVENOUS | Status: AC
  Administered 2020-03-11: 10:00:00 10 mL via INTRAVENOUS
  Filled 2020-03-11: qty 10

## 2020-03-11 MED ORDER — HEPARIN SOD (PORK) LOCK FLUSH 100 UNIT/ML IV SOLN
500.0000 [IU] | Freq: Once | INTRAVENOUS | Status: AC
  Administered 2020-03-11: 500 [IU] via INTRAVENOUS
  Filled 2020-03-11: qty 5

## 2020-03-11 MED ORDER — SODIUM CHLORIDE 0.9 % IV SOLN
Freq: Once | INTRAVENOUS | Status: AC
  Filled 2020-03-11: qty 250

## 2020-03-11 NOTE — Progress Notes (Signed)
Patient stated that she had been doing well except for the tingling on her fingers and toes.

## 2020-03-13 NOTE — Progress Notes (Signed)
Hematology/Oncology Consult note [facility]  [phone] [phone]  Patient Care Team: [name], MD as PCP - General (Internal Medicine) Minus Breeding, MD as PCP - Cardiology (Cardiology) Jacelyn Pi, MD as Referring Physician (Endocrinology) Lahoma Rocker, MD as Consulting Physician (Rheumatology) Rico Junker, RN as Registered Nurse Theodore Demark, RN as Registered Nurse   Name of the patient: [name]  [id number]  1956/03/02   Date of visit: 03/13/20  Diagnosis- Invasive lobular carcinoma of theleftbreast pathological prognostic stage Ib pT2 pN2 acM0 ER/PR positive HER-2/neu negative s/p mastectomy and targeted node dissection   Chief complaint/ Reason for visit- on treatment assessment prior to cycle 4 of adjuvant TC chemotherapy  Heme/Onc history: Patient is a 64 year old female with a past medical history significant for hypertension, CKD, diabetes, nonischemic cardiomyopathy among other medical problems. She has not undergone a mammogram for several years. Mammogram on October 2020 was done after she had a palpable area of concern in the left breast which showed an irregular mass with increased vascularity measuring 3.1 x 2.4 x 4.5 cm. The nodularity extended towards the nipple measuring at least 5.4 cm. Single abnormal lymph node in the left axilla with cortical thickness of 0.7 cm. 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  PET scan showed hypermetabolic left axillary lymph nodes which was reviewed at tumor board and they wereat least found to be 3-4. Retroareolar left breast mass 2.5 x 3.2 cm in size. No evidence of distant metastatic disease. There were small to borderline enlarged retroperitoneal lymph nodes 10 mm in size with an SUV of  4.3.Retroperitoneal lymph nodes will require follow-up in the future  MRI showed 2 sites of biopsy-proven malignancy in the lateral left breast at posterior and anterior depth. There is non-mass enhancement spanning between the 2 sites. Morphologically abnormal level 1 axillary lymph node consistent with biopsy-proven metastatic disease.  Final pathology showed 2 separate tumors one which was 4.5 cm and the other one was 4.4 cm in size. Grade 2 ER/PR positive HER-2/neu negative with negative margins. 5 out of 11 lymph nodes were positive for malignancy. Extranodal extension present. Largest size of metastatic deposit 14 mm. mpT2 mpN2  Patient is not a candidate for anthracycline-based chemotherapy per cardiology. Adjuvant TC chemotherapy started on 01/03/2020    Interval history-she feels well and denies any complaints at this time.  She does have some mild tingling numbness in her extremities which is intermittent and does not persist  ECOG PS- 1 Pain scale- 0  Review of systems- Review of Systems  Constitutional: Positive for malaise/fatigue. Negative for chills, fever and weight loss.  HENT: Negative for congestion, ear discharge and nosebleeds.   Eyes: Negative for blurred vision.  Respiratory: Negative for cough, hemoptysis, sputum production, shortness of breath and wheezing.   Cardiovascular: Negative for chest pain, palpitations, orthopnea and claudication.  Gastrointestinal: Negative for abdominal pain, blood in stool, constipation, diarrhea, heartburn, melena, nausea and vomiting.  Genitourinary: Negative for dysuria, flank pain, frequency, hematuria and urgency.  Musculoskeletal: Negative for back pain, joint pain and myalgias.  Skin: Negative for rash.  Neurological: Positive for sensory change (Peripheral neuropathy). Negative for dizziness, tingling, focal weakness, seizures, weakness and headaches.  Endo/Heme/Allergies: Does not bruise/bleed easily.    Psychiatric/Behavioral: Negative for depression and suicidal ideas. The patient does not  have insomnia.       Allergies  Allergen Reactions  . Hydralazine Swelling    Eye Swelling  . Other Other (See Comments)    Unknown blood pressure medication caused face swelling     Past Medical History:  Diagnosis Date  . Arthritis of both feet    Gout  . Cancer [facility])    Breast Cancer on left  . Chronic combined systolic and diastolic CHF (congestive heart failure) ([place])    a. Echo (06/2014): Severe LVH, EF 20-25%, no RWMA, Gr 3 DD, trivial MR, mild LAE, mild RVE, PASP 38 mmHg .>> b. Echo 2/16 EF 50-55% //  // c. Echo 02/2019: EF 50-55, mod LVH, Gr 1 DD, mild LAE // Echocardiogram 02/2020: EF 60-65, no RWMA, mild LVH, Gr 1 DD, normal RVSF, RVSP 30.5   . CKD (chronic kidney disease), stage III    now stage 4  . Diabetes mellitus type 2 in obese ([place])   . Diverticular disease   . Dyspnea   . Family history of breast cancer   . Family history of leukemia   . Family history of lung cancer   . Family history of testicular cancer   . Family history of throat cancer   . Fatty liver   . History of GI diverticular bleed   . Hyperlipidemia   . Hyperphosphatemia   . Hypertension   . Hypertensive heart disease    a. Renal Artery Duplex (8/15):  no RAS  . Iron deficiency anemia   . NICM (nonischemic cardiomyopathy) ([place])    a. Nuclear (06/2014): EF 32%, apical thinning, no definite scar or ischemia;  b. Echo (2/16):  Mild LVH, EF 50-55%, Gr 2 DD, no RWMA  . Obesity   . PONV (postoperative nausea and vomiting)    after D and C  . Proteinuria   . PUD (peptic ulcer disease)   . Secondary hyperparathyroidism, renal ([place])   . Tobacco abuse   . UTI (lower urinary tract infection)   . Vaginal dryness, menopausal   . Vaginal yeast infection      Past Surgical History:  Procedure Laterality Date  . COLONOSCOPY    . DILATION AND CURETTAGE OF UTERUS    . MASTECTOMY W/ SENTINEL NODE BIOPSY Left  11/12/2019  . MASTECTOMY W/ SENTINEL NODE BIOPSY Left 11/12/2019   Procedure: LEFT MASTECTOMY WITH SENTINEL LYMPH NODE BIOPSY AND TARGETED NODE DISSECTION;  Surgeon: Jovita Kussmaul, MD;  Location: [place];  Service: General;  Laterality: Left;  . None    . PORTA CATH INSERTION N/A 12/11/2019   Procedure: PORTA CATH INSERTION;  Surgeon: Katha Cabal, MD;  Location: [place] [facility];  Service: Cardiovascular;  Laterality: N/A;    Social History   Socioeconomic History  . Marital status: Widowed    Spouse name: Not on file  . Number of children: 2  . Years of education: 7  . Highest education level: Not on file  Occupational History  . Not on file  Tobacco Use  . Smoking status: Former Smoker    Quit date: 07/05/2014    Years since quitting: 5.6  . Smokeless tobacco: Never Used  Substance and Sexual Activity  . Alcohol use: No  . Drug use: No  . Sexual activity: Not on file  Other Topics Concern  . Not on file  Social History Narrative   Currently lives in a house with her husband.    Fun: Watch TV.   Denies religious  beliefs effecting health care.    Social Determinants of Health   Financial Resource Strain:   . Difficulty of Paying Living Expenses:   Food Insecurity:   . Worried About Charity fundraiser in the Last Year:   . Arboriculturist in the Last Year:   Transportation Needs:   . Film/video editor (Medical):   Marland Kitchen Lack of Transportation (Non-Medical):   Physical Activity:   . Days of Exercise per Week:   . Minutes of Exercise per Session:   Stress:   . Feeling of Stress :   Social Connections:   . Frequency of Communication with Friends and Family:   . Frequency of Social Gatherings with Friends and Family:   . Attends Religious Services:   . Active Member of Clubs or Organizations:   . Attends Archivist Meetings:   Marland Kitchen Marital Status:   Intimate Partner Violence:   . Fear of Current or Ex-Partner:   . Emotionally Abused:   Marland Kitchen  Physically Abused:   . Sexually Abused:     Family History  Problem Relation Age of Onset  . Heart attack Paternal Grandfather   . Stroke Paternal Grandmother   . Hypertension Paternal Grandmother   . Kidney disease Mother   . Diabetes Mother   . Testicular cancer Father        diagnosed older than 5  . Coronary artery disease Other   . Hypertension Other   . Breast cancer Sister        diagnosed late 61s, negative genetics  . Leukemia Brother        chronic  . Throat cancer Brother   . Pneumonia Sister 2  . Lung cancer Paternal Uncle        diagnosed over 64     Current Outpatient Medications:  .  acetaminophen (TYLENOL) 500 MG tablet, Take 1,000 mg by mouth every 6 (six) hours as needed for mild pain, moderate pain or headache. For pain, Disp: , Rfl:  .  allopurinol (ZYLOPRIM) 100 MG tablet, Take 200 mg by mouth daily., Disp: , Rfl:  .  amLODipine (NORVASC) 5 MG tablet, Take 1 tablet (5 mg total) by mouth daily. (Patient taking differently: Take 5 mg by mouth at bedtime. ), Disp: 90 tablet, Rfl: 3 .  aspirin EC 81 MG EC tablet, Take 1 tablet (81 mg total) by mouth daily., Disp: , Rfl:  .  atorvastatin (LIPITOR) 40 MG tablet, Take 1 tablet (40 mg total) by mouth daily at 6 PM., Disp: 90 tablet, Rfl: 3 .  B-D ULTRAFINE III SHORT PEN 31G X 8 MM MISC, USE AS DIRECTED *EMERGENCY FILL*, Disp: , Rfl: 0 .  blood glucose meter kit and supplies KIT, Dispense based on patient and insurance preference. Use up to four times daily as directed. (FOR ICD-9 250.00, 250.01)., Disp: 1 each, Rfl: 0 .  calcitRIOL (ROCALTROL) 0.25 MCG capsule, Take 0.25 mcg by mouth daily., Disp: , Rfl:  .  cloNIDine (CATAPRES) 0.1 MG tablet, Take 1 tablet (0.1 mg total) by mouth 2 (two) times daily., Disp: 180 tablet, Rfl: 3 .  Colchicine 0.6 MG CAPS, Take 0.6 mg by mouth every other day. , Disp: , Rfl:  .  furosemide (LASIX) 40 MG tablet, Take 40 mg by mouth 2 (two) times daily., Disp: , Rfl:  .  gabapentin  (NEURONTIN) 100 MG capsule, Take 2 capsules (200 mg total) by mouth 3 (three) times daily., Disp: 180 capsule, Rfl: 1 .  insulin aspart (NOVOLOG FLEXPEN) 100 UNIT/ML FlexPen, Inject 5 Units into the skin 3 (three) times daily with meals. Additional insulin (5 units PLUS) CBG 70 - 120: 0 units CBG 121 - 150: 1 unit CBG 151 - 200: 2 units CBG 201 - 250: 3 units CBG 251 - 350: 4 units CBG 351 - 400: 6 units (Patient taking differently: Inject 5 Units into the skin 3 (three) times daily with meals. Sliding scale  5 units/ plus base), Disp: 15 mL, Rfl: 11 .  insulin glargine (LANTUS) 100 UNIT/ML injection, Inject 20 Units into the skin at bedtime. , Disp: , Rfl:  .  isosorbide mononitrate (IMDUR) 120 MG 24 hr tablet, TAKE 1/2 TABLET DAILY = 60 MG DAILY (Patient taking differently: Take 60 mg by mouth daily. ), Disp: 45 tablet, Rfl: 3 .  lidocaine-prilocaine (EMLA) cream, Apply to affected area once, Disp: 30 g, Rfl: 3 .  loratadine (CLARITIN) 10 MG tablet, Take 10 mg by mouth daily as needed for allergies (around onpro neulasta with treatment)., Disp: , Rfl:  .  LORazepam (ATIVAN) 0.5 MG tablet, Take 1 tablet (0.5 mg total) by mouth every 6 (six) hours as needed (Nausea or vomiting)., Disp: 30 tablet, Rfl: 0 .  Menthol, Topical Analgesic, (ICY HOT EX), Apply 1 application topically daily as needed (pain)., Disp: , Rfl:  .  metoprolol tartrate (LOPRESSOR) 25 MG tablet, Take 1 tablet (25 mg total) by mouth 2 (two) times daily., Disp: 180 tablet, Rfl: 3 .  ondansetron (ZOFRAN) 8 MG tablet, Take 1 tablet (8 mg total) by mouth 2 (two) times daily as needed for refractory nausea / vomiting. Start on day 3 after chemo., Disp: 30 tablet, Rfl: 1 .  ONE TOUCH ULTRA TEST test strip, 1 each by Other route 4 (four) times daily. , Disp: , Rfl: 1 .  ONETOUCH DELICA LANCETS 52D MISC, Inject 33 g into the skin 4 (four) times daily. , Disp: , Rfl: 1 .  polyethylene glycol (MIRALAX / GLYCOLAX) 17 g packet, Take 17 g by mouth  daily as needed for moderate constipation., Disp: , Rfl:  .  prochlorperazine (COMPAZINE) 10 MG tablet, Take 1 tablet (10 mg total) by mouth every 6 (six) hours as needed (Nausea or vomiting)., Disp: 30 tablet, Rfl: 1 .  telmisartan (MICARDIS) 20 MG tablet, Take 1 tablet (20 mg total) by mouth daily. (Patient taking differently: Take 20 mg by mouth at bedtime. ), Disp: 90 tablet, Rfl: 3 .  triamcinolone ointment (KENALOG) 0.5 %, Apply 1 application topically 3 (three) times daily., Disp: 30 g, Rfl: 2  Physical exam:  Vitals:   03/11/20 1000  BP: 110/67  Pulse: 73  Temp: (!) 96.8 F (36 C)  TempSrc: Tympanic  SpO2: 95%  Weight: 192 lb 1.6 oz (87.1 kg)   Physical Exam Constitutional:      General: She is not in acute distress. HENT:     Head: Normocephalic and atraumatic.  Eyes:     Pupils: Pupils are equal, round, and reactive to light.  Cardiovascular:     Rate and Rhythm: Normal rate and regular rhythm.     Heart sounds: Normal heart sounds.  Pulmonary:     Effort: Pulmonary effort is normal.     Breath sounds: Normal breath sounds.  Abdominal:     General: Bowel sounds are normal.     Palpations: Abdomen is soft.  Musculoskeletal:     Cervical back: Normal range of motion.  Skin:  General: Skin is warm and dry.  Neurological:     Mental Status: She is alert and oriented to person, place, and time.      CMP Latest Ref Rng & Units 03/11/2020  Glucose 70 - 99 mg/dL 165(H)  BUN 8 - 23 mg/dL 51(H)  Creatinine 0.44 - 1.00 mg/dL 2.03(H)  Sodium 135 - 145 mmol/L 136  Potassium 3.5 - 5.1 mmol/L 3.9  Chloride 98 - 111 mmol/L 102  CO2 22 - 32 mmol/L 24  Calcium 8.9 - 10.3 mg/dL 8.5(L)  Total Protein 6.5 - 8.1 g/dL 6.6  Total Bilirubin 0.3 - 1.2 mg/dL 0.7  Alkaline Phos 38 - 126 U/L 111  AST 15 - 41 U/L 16  ALT 0 - 44 U/L 15   CBC Latest Ref Rng & Units 03/11/2020  WBC 4.0 - 10.5 K/uL 7.0  Hemoglobin 12.0 - 15.0 g/dL 10.4(L)  Hematocrit 36.0 - 46.0 % 32.4(L)    Platelets 150 - 400 K/uL 202    No images are attached to the encounter.  ECHOCARDIOGRAM COMPLETE  Result Date: 02/28/2020    ECHOCARDIOGRAM REPORT   Patient Name:   SARELY STRACENER   Date of Exam: 02/28/2020 Medical Rec #:  [id number]     Height:       60.0 in Accession #:    [id number]    Weight:       192.8 lb Date of Birth:  May 19, 1956     BSA:          1.838 m Patient Age:    64 years      BP:           139/94 mmHg Patient Gender: F             HR:           82 bpm. Exam Location:  [address] Procedure: 2D Echo, Cardiac Doppler and Color Doppler Indications:    I50.32 CHF  History:        Patient has prior history of Echocardiogram examinations, most                 recent 02/26/2019. Breast cancer; Risk Factors:Hypertension and                 HLD, CKD.  Sonographer:    Marygrace Drought RCS Referring Phys: [place]  1. Left ventricular ejection fraction, by estimation, is 60 to 65%. The left ventricle has normal function. The left ventricle has no regional wall motion abnormalities. There is mild left ventricular hypertrophy. Left ventricular diastolic parameters are consistent with Grade I diastolic dysfunction (impaired relaxation).  2. Right ventricular systolic function is normal. The right ventricular size is normal. There is normal pulmonary artery systolic pressure. The estimated right ventricular systolic pressure is 09.3 mmHg.  3. The mitral valve is normal in structure. No evidence of mitral valve regurgitation. No evidence of mitral stenosis.  4. The aortic valve is normal in structure. Aortic valve regurgitation is not visualized. Mild aortic valve sclerosis is present, with no evidence of aortic valve stenosis.  5. The inferior vena cava is normal in size with greater than 50% respiratory variability, suggesting right atrial pressure of 3 mmHg. FINDINGS  Left Ventricle: Global longitudinal strain performed but not reported per interpreter judgement, due to possible  suboptimal tracking. Left ventricular ejection fraction, by estimation, is 60 to 65%. The left ventricle has normal function. The left ventricle has no regional wall motion abnormalities.  The left ventricular internal cavity size was normal in size. There is mild left ventricular hypertrophy. Left ventricular diastolic parameters are consistent with Grade I diastolic dysfunction (impaired relaxation). Right Ventricle: The right ventricular size is normal. No increase in right ventricular wall thickness. Right ventricular systolic function is normal. There is normal pulmonary artery systolic pressure. The tricuspid regurgitant velocity is 2.62 m/s, and  with an assumed right atrial pressure of 3 mmHg, the estimated right ventricular systolic pressure is 02.7 mmHg. Left Atrium: Left atrial size was normal in size. Right Atrium: Right atrial size was normal in size. Pericardium: There is no evidence of pericardial effusion. Presence of pericardial fat pad. Mitral Valve: The mitral valve is normal in structure. Normal mobility of the mitral valve leaflets. No evidence of mitral valve regurgitation. No evidence of mitral valve stenosis. Tricuspid Valve: The tricuspid valve is normal in structure. Tricuspid valve regurgitation is trivial. No evidence of tricuspid stenosis. Aortic Valve: The aortic valve is normal in structure. Aortic valve regurgitation is not visualized. Mild aortic valve sclerosis is present, with no evidence of aortic valve stenosis. Pulmonic Valve: The pulmonic valve was normal in structure. Pulmonic valve regurgitation is not visualized. No evidence of pulmonic stenosis. Aorta: The aortic root and ascending aorta are structurally normal, with no evidence of dilitation. Venous: The inferior vena cava is normal in size with greater than 50% respiratory variability, suggesting right atrial pressure of 3 mmHg. IAS/Shunts: No atrial level shunt detected by color flow Doppler.  LEFT VENTRICLE PLAX 2D  LVIDd:         3.75 cm  Diastology LVIDs:         2.76 cm  LV e' lateral:   5.98 cm/s LV PW:         1.49 cm  LV E/e' lateral: 13.4 LV IVS:        1.22 cm  LV e' medial:    3.81 cm/s LVOT diam:     2.00 cm  LV E/e' medial:  21.0 LV SV:         76 LV SV Index:   41 LVOT Area:     3.14 cm  RIGHT VENTRICLE RV S prime:     15.10 cm/s RVSP:           30.5 mmHg LEFT ATRIUM             Index       RIGHT ATRIUM           Index LA diam:        3.90 cm 2.12 cm/m  RA Pressure: 3.00 mmHg LA Vol (A2C):   49.0 ml 26.66 ml/m RA Area:     12.40 cm LA Vol (A4C):   45.8 ml 24.92 ml/m RA Volume:   31.50 ml  17.14 ml/m LA Biplane Vol: 49.6 ml 26.99 ml/m  AORTIC VALVE LVOT Vmax:   103.00 cm/s LVOT Vmean:  65.900 cm/s LVOT VTI:    0.242 m  AORTA Ao Root diam: 3.20 cm MITRAL VALVE               TRICUSPID VALVE MV Area (PHT):             TR Peak grad:   27.5 mmHg MV Decel Time:             TR Vmax:        262.00 cm/s MV E velocity: 80.10 cm/s  Estimated RAP:  3.00 mmHg MV A velocity: 95.30 cm/s  RVSP:           30.5 mmHg MV E/A ratio:  0.84                            SHUNTS                            Systemic VTI:  0.24 m                            Systemic Diam: 2.00 cm Cherlynn Kaiser MD Electronically signed by Cherlynn Kaiser MD Signature Date/Time: 02/28/2020/10:01:20 PM    Final      Assessment and plan- Patient is a 64 y.o. female  with pathological prognostic stage Ib invasive lobular carcinoma pT2 pN2 acM0 ER/PR positive HER-2/neu negative s/pleft mastectomy and targeted node dissection.She is here for on treatment assessment prior to cycle 4 of adjuvant TC chemotherapy  Patient's creatinine is elevated at 2.03 a little higher as compared to her baseline.  She does have baseline CKD and her creatinine has been fluctuating between 1.4-1.6.  I will therefore hold her chemotherapy today and give her 1 L of IV fluids.  I have also asked her to improve her oral hydration.  She will come back in 1 week's time with repeat  CMP and proceed with cycle 4 of TC chemotherapy with on pro-Neulasta support which would be her last cycle.  We will also get in touch with radiation oncology and move her radiation appointment out by 1 week.  Chemo-induced peripheral neuropathy: Mild grade 1 continue to monitor  I will see her back in 3 weeks from now with a CBC with differential and CMP   Visit Diagnosis 1. AKI (acute kidney injury) ([place])   2. Malignant neoplasm of overlapping sites of left breast in female, estrogen receptor positive ([place])   3. Encounter for antineoplastic chemotherapy   4. Chemotherapy-induced peripheral neuropathy ([facility])      Dr. Randa Evens, MD, MPH [facility] [phone] 03/13/2020 1:00 PM

## 2020-03-17 DIAGNOSIS — Z794 Long term (current) use of insulin: Secondary | ICD-10-CM | POA: Diagnosis not present

## 2020-03-17 DIAGNOSIS — M109 Gout, unspecified: Secondary | ICD-10-CM | POA: Diagnosis not present

## 2020-03-17 DIAGNOSIS — C50912 Malignant neoplasm of unspecified site of left female breast: Secondary | ICD-10-CM | POA: Diagnosis not present

## 2020-03-17 DIAGNOSIS — N2581 Secondary hyperparathyroidism of renal origin: Secondary | ICD-10-CM | POA: Diagnosis not present

## 2020-03-17 DIAGNOSIS — I129 Hypertensive chronic kidney disease with stage 1 through stage 4 chronic kidney disease, or unspecified chronic kidney disease: Secondary | ICD-10-CM | POA: Diagnosis not present

## 2020-03-17 DIAGNOSIS — N183 Chronic kidney disease, stage 3 unspecified: Secondary | ICD-10-CM | POA: Diagnosis not present

## 2020-03-17 DIAGNOSIS — E1122 Type 2 diabetes mellitus with diabetic chronic kidney disease: Secondary | ICD-10-CM | POA: Diagnosis not present

## 2020-03-17 DIAGNOSIS — I428 Other cardiomyopathies: Secondary | ICD-10-CM | POA: Diagnosis not present

## 2020-03-17 DIAGNOSIS — D649 Anemia, unspecified: Secondary | ICD-10-CM | POA: Diagnosis not present

## 2020-03-18 ENCOUNTER — Inpatient Hospital Stay: Payer: PPO

## 2020-03-18 ENCOUNTER — Other Ambulatory Visit: Payer: Self-pay

## 2020-03-18 VITALS — BP 150/80 | HR 62 | Temp 96.3°F | Resp 20 | Wt 194.0 lb

## 2020-03-18 DIAGNOSIS — Z170 Estrogen receptor positive status [ER+]: Secondary | ICD-10-CM

## 2020-03-18 DIAGNOSIS — Z5111 Encounter for antineoplastic chemotherapy: Secondary | ICD-10-CM | POA: Diagnosis not present

## 2020-03-18 DIAGNOSIS — C50812 Malignant neoplasm of overlapping sites of left female breast: Secondary | ICD-10-CM

## 2020-03-18 LAB — CBC WITH DIFFERENTIAL/PLATELET
Abs Immature Granulocytes: 0.01 10*3/uL (ref 0.00–0.07)
Basophils Absolute: 0.1 10*3/uL (ref 0.0–0.1)
Basophils Relative: 1 %
Eosinophils Absolute: 0.4 10*3/uL (ref 0.0–0.5)
Eosinophils Relative: 8 %
HCT: 34.8 % — ABNORMAL LOW (ref 36.0–46.0)
Hemoglobin: 11 g/dL — ABNORMAL LOW (ref 12.0–15.0)
Immature Granulocytes: 0 %
Lymphocytes Relative: 12 %
Lymphs Abs: 0.5 10*3/uL — ABNORMAL LOW (ref 0.7–4.0)
MCH: 28.6 pg (ref 26.0–34.0)
MCHC: 31.6 g/dL (ref 30.0–36.0)
MCV: 90.6 fL (ref 80.0–100.0)
Monocytes Absolute: 0.5 10*3/uL (ref 0.1–1.0)
Monocytes Relative: 10 %
Neutro Abs: 3.2 10*3/uL (ref 1.7–7.7)
Neutrophils Relative %: 69 %
Platelets: 174 10*3/uL (ref 150–400)
RBC: 3.84 MIL/uL — ABNORMAL LOW (ref 3.87–5.11)
RDW: 19.9 % — ABNORMAL HIGH (ref 11.5–15.5)
WBC: 4.6 10*3/uL (ref 4.0–10.5)
nRBC: 0 % (ref 0.0–0.2)

## 2020-03-18 LAB — COMPREHENSIVE METABOLIC PANEL
ALT: 15 U/L (ref 0–44)
AST: 17 U/L (ref 15–41)
Albumin: 3.5 g/dL (ref 3.5–5.0)
Alkaline Phosphatase: 106 U/L (ref 38–126)
Anion gap: 7 (ref 5–15)
BUN: 26 mg/dL — ABNORMAL HIGH (ref 8–23)
CO2: 23 mmol/L (ref 22–32)
Calcium: 8.7 mg/dL — ABNORMAL LOW (ref 8.9–10.3)
Chloride: 107 mmol/L (ref 98–111)
Creatinine, Ser: 1.46 mg/dL — ABNORMAL HIGH (ref 0.44–1.00)
GFR calc Af Amer: 43 mL/min — ABNORMAL LOW (ref >60)
GFR calc non Af Amer: 37 mL/min — ABNORMAL LOW (ref >60)
Glucose, Bld: 155 mg/dL — ABNORMAL HIGH (ref 70–99)
Potassium: 4.3 mmol/L (ref 3.5–5.1)
Sodium: 137 mmol/L (ref 135–145)
Total Bilirubin: 0.7 mg/dL (ref 0.3–1.2)
Total Protein: 6.6 g/dL (ref 6.5–8.1)

## 2020-03-18 MED ORDER — SODIUM CHLORIDE 0.9 % IV SOLN
600.0000 mg/m2 | Freq: Once | INTRAVENOUS | Status: AC
  Administered 2020-03-18: 1160 mg via INTRAVENOUS
  Filled 2020-03-18: qty 50

## 2020-03-18 MED ORDER — PEGFILGRASTIM 6 MG/0.6ML ~~LOC~~ PSKT
6.0000 mg | PREFILLED_SYRINGE | Freq: Once | SUBCUTANEOUS | Status: AC
  Administered 2020-03-18: 6 mg via SUBCUTANEOUS
  Filled 2020-03-18: qty 0.6

## 2020-03-18 MED ORDER — SODIUM CHLORIDE 0.9% FLUSH
10.0000 mL | INTRAVENOUS | Status: DC | PRN
  Administered 2020-03-18: 10 mL via INTRAVENOUS
  Filled 2020-03-18: qty 10

## 2020-03-18 MED ORDER — SODIUM CHLORIDE 0.9 % IV SOLN
75.0000 mg/m2 | Freq: Once | INTRAVENOUS | Status: AC
  Administered 2020-03-18: 140 mg via INTRAVENOUS
  Filled 2020-03-18: qty 14

## 2020-03-18 MED ORDER — HEPARIN SOD (PORK) LOCK FLUSH 100 UNIT/ML IV SOLN
INTRAVENOUS | Status: AC
  Filled 2020-03-18: qty 5

## 2020-03-18 MED ORDER — PALONOSETRON HCL INJECTION 0.25 MG/5ML
0.2500 mg | Freq: Once | INTRAVENOUS | Status: AC
  Administered 2020-03-18: 0.25 mg via INTRAVENOUS
  Filled 2020-03-18: qty 5

## 2020-03-18 MED ORDER — SODIUM CHLORIDE 0.9 % IV SOLN
Freq: Once | INTRAVENOUS | Status: AC
  Filled 2020-03-18: qty 250

## 2020-03-18 MED ORDER — DEXAMETHASONE SODIUM PHOSPHATE 10 MG/ML IJ SOLN
4.0000 mg | Freq: Once | INTRAMUSCULAR | Status: AC
  Administered 2020-03-18: 4 mg via INTRAVENOUS
  Filled 2020-03-18: qty 1

## 2020-03-18 MED ORDER — HEPARIN SOD (PORK) LOCK FLUSH 100 UNIT/ML IV SOLN
500.0000 [IU] | Freq: Once | INTRAVENOUS | Status: AC
  Administered 2020-03-18: 500 [IU] via INTRAVENOUS
  Filled 2020-03-18: qty 5

## 2020-03-18 NOTE — Progress Notes (Signed)
Creatinine: 1.46 today. MD, Dr. Janese Banks, notified and aware. Per MD order: proceed with scheduled Taxotere and Cytoxan treatment today.

## 2020-03-25 ENCOUNTER — Ambulatory Visit: Payer: PPO

## 2020-03-31 ENCOUNTER — Inpatient Hospital Stay: Payer: PPO | Attending: Oncology

## 2020-03-31 ENCOUNTER — Inpatient Hospital Stay (HOSPITAL_BASED_OUTPATIENT_CLINIC_OR_DEPARTMENT_OTHER): Payer: PPO | Admitting: Oncology

## 2020-03-31 ENCOUNTER — Encounter: Payer: Self-pay | Admitting: Oncology

## 2020-03-31 ENCOUNTER — Other Ambulatory Visit: Payer: Self-pay

## 2020-03-31 VITALS — BP 102/80 | HR 67 | Temp 96.3°F | Resp 18 | Wt 192.5 lb

## 2020-03-31 DIAGNOSIS — Z8249 Family history of ischemic heart disease and other diseases of the circulatory system: Secondary | ICD-10-CM | POA: Diagnosis not present

## 2020-03-31 DIAGNOSIS — Z7189 Other specified counseling: Secondary | ICD-10-CM

## 2020-03-31 DIAGNOSIS — T451X5A Adverse effect of antineoplastic and immunosuppressive drugs, initial encounter: Secondary | ICD-10-CM

## 2020-03-31 DIAGNOSIS — Z806 Family history of leukemia: Secondary | ICD-10-CM | POA: Insufficient documentation

## 2020-03-31 DIAGNOSIS — Z9221 Personal history of antineoplastic chemotherapy: Secondary | ICD-10-CM | POA: Insufficient documentation

## 2020-03-31 DIAGNOSIS — Z170 Estrogen receptor positive status [ER+]: Secondary | ICD-10-CM | POA: Diagnosis not present

## 2020-03-31 DIAGNOSIS — Z794 Long term (current) use of insulin: Secondary | ICD-10-CM | POA: Insufficient documentation

## 2020-03-31 DIAGNOSIS — Z79899 Other long term (current) drug therapy: Secondary | ICD-10-CM | POA: Insufficient documentation

## 2020-03-31 DIAGNOSIS — Z801 Family history of malignant neoplasm of trachea, bronchus and lung: Secondary | ICD-10-CM | POA: Diagnosis not present

## 2020-03-31 DIAGNOSIS — Z9012 Acquired absence of left breast and nipple: Secondary | ICD-10-CM | POA: Insufficient documentation

## 2020-03-31 DIAGNOSIS — Z7982 Long term (current) use of aspirin: Secondary | ICD-10-CM | POA: Diagnosis not present

## 2020-03-31 DIAGNOSIS — D6481 Anemia due to antineoplastic chemotherapy: Secondary | ICD-10-CM | POA: Diagnosis not present

## 2020-03-31 DIAGNOSIS — Z833 Family history of diabetes mellitus: Secondary | ICD-10-CM | POA: Insufficient documentation

## 2020-03-31 DIAGNOSIS — E119 Type 2 diabetes mellitus without complications: Secondary | ICD-10-CM | POA: Insufficient documentation

## 2020-03-31 DIAGNOSIS — Z8349 Family history of other endocrine, nutritional and metabolic diseases: Secondary | ICD-10-CM | POA: Insufficient documentation

## 2020-03-31 DIAGNOSIS — C50812 Malignant neoplasm of overlapping sites of left female breast: Secondary | ICD-10-CM | POA: Insufficient documentation

## 2020-03-31 DIAGNOSIS — Z803 Family history of malignant neoplasm of breast: Secondary | ICD-10-CM | POA: Insufficient documentation

## 2020-03-31 DIAGNOSIS — N183 Chronic kidney disease, stage 3 unspecified: Secondary | ICD-10-CM | POA: Insufficient documentation

## 2020-03-31 DIAGNOSIS — Z87891 Personal history of nicotine dependence: Secondary | ICD-10-CM | POA: Diagnosis not present

## 2020-03-31 DIAGNOSIS — I10 Essential (primary) hypertension: Secondary | ICD-10-CM | POA: Diagnosis not present

## 2020-03-31 LAB — COMPREHENSIVE METABOLIC PANEL
ALT: 17 U/L (ref 0–44)
AST: 17 U/L (ref 15–41)
Albumin: 3.4 g/dL — ABNORMAL LOW (ref 3.5–5.0)
Alkaline Phosphatase: 108 U/L (ref 38–126)
Anion gap: 8 (ref 5–15)
BUN: 31 mg/dL — ABNORMAL HIGH (ref 8–23)
CO2: 28 mmol/L (ref 22–32)
Calcium: 8.9 mg/dL (ref 8.9–10.3)
Chloride: 106 mmol/L (ref 98–111)
Creatinine, Ser: 1.55 mg/dL — ABNORMAL HIGH (ref 0.44–1.00)
GFR calc Af Amer: 40 mL/min — ABNORMAL LOW (ref >60)
GFR calc non Af Amer: 35 mL/min — ABNORMAL LOW (ref >60)
Glucose, Bld: 126 mg/dL — ABNORMAL HIGH (ref 70–99)
Potassium: 4.8 mmol/L (ref 3.5–5.1)
Sodium: 142 mmol/L (ref 135–145)
Total Bilirubin: 0.7 mg/dL (ref 0.3–1.2)
Total Protein: 6.5 g/dL (ref 6.5–8.1)

## 2020-03-31 LAB — CBC WITH DIFFERENTIAL/PLATELET
Abs Immature Granulocytes: 0.33 10*3/uL — ABNORMAL HIGH (ref 0.00–0.07)
Basophils Absolute: 0.1 10*3/uL (ref 0.0–0.1)
Basophils Relative: 1 %
Eosinophils Absolute: 0 10*3/uL (ref 0.0–0.5)
Eosinophils Relative: 1 %
HCT: 33.6 % — ABNORMAL LOW (ref 36.0–46.0)
Hemoglobin: 10.7 g/dL — ABNORMAL LOW (ref 12.0–15.0)
Immature Granulocytes: 4 %
Lymphocytes Relative: 6 %
Lymphs Abs: 0.5 10*3/uL — ABNORMAL LOW (ref 0.7–4.0)
MCH: 28.9 pg (ref 26.0–34.0)
MCHC: 31.8 g/dL (ref 30.0–36.0)
MCV: 90.8 fL (ref 80.0–100.0)
Monocytes Absolute: 0.5 10*3/uL (ref 0.1–1.0)
Monocytes Relative: 6 %
Neutro Abs: 7.1 10*3/uL (ref 1.7–7.7)
Neutrophils Relative %: 82 %
Platelets: 184 10*3/uL (ref 150–400)
RBC: 3.7 MIL/uL — ABNORMAL LOW (ref 3.87–5.11)
RDW: 19.4 % — ABNORMAL HIGH (ref 11.5–15.5)
WBC: 8.6 10*3/uL (ref 4.0–10.5)
nRBC: 0.5 % — ABNORMAL HIGH (ref 0.0–0.2)

## 2020-03-31 MED ORDER — LORAZEPAM 0.5 MG PO TABS: 0.5000 mg | ORAL_TABLET | Freq: Four times a day (QID) | ORAL | 0 refills | Status: DC | PRN

## 2020-03-31 NOTE — Progress Notes (Signed)
Patient is here for follow up she is doing well no complaints  

## 2020-04-01 ENCOUNTER — Ambulatory Visit
Admission: RE | Admit: 2020-04-01 | Discharge: 2020-04-01 | Disposition: A | Discharge status: Home / Self Care | Payer: PPO | Source: Ambulatory Visit | Attending: Radiation Oncology | Admitting: Radiation Oncology

## 2020-04-01 DIAGNOSIS — Z510 Encounter for antineoplastic radiation therapy: Secondary | ICD-10-CM | POA: Insufficient documentation

## 2020-04-01 DIAGNOSIS — C50812 Malignant neoplasm of overlapping sites of left female breast: Secondary | ICD-10-CM | POA: Diagnosis not present

## 2020-04-01 DIAGNOSIS — Z9012 Acquired absence of left breast and nipple: Secondary | ICD-10-CM | POA: Diagnosis not present

## 2020-04-01 DIAGNOSIS — Z170 Estrogen receptor positive status [ER+]: Secondary | ICD-10-CM | POA: Insufficient documentation

## 2020-04-02 MED ORDER — LETROZOLE 2.5 MG PO TABS: 2.5000 mg | ORAL_TABLET | Freq: Every day | ORAL | 3 refills | Status: DC

## 2020-04-02 NOTE — Progress Notes (Signed)
Hematology/Oncology Consult note [facility]  [phone] [phone]  Patient Care Team: [name], MD as PCP - General (Internal Medicine) Minus Breeding, MD as PCP - Cardiology (Cardiology) Jacelyn Pi, MD as Referring Physician (Endocrinology) Lahoma Rocker, MD as Consulting Physician (Rheumatology) Rico Junker, RN as Registered Nurse Theodore Demark, RN as Registered Nurse   Name of the patient: [name]  [id number]  [birth year]   Date of visit: 04/02/20  Diagnosis- Invasive lobular carcinoma of theleftbreast pathological prognostic stage Ib pT2 pN2 acM0 ER/PR positive HER-2/neu negative s/p mastectomy and targeted node dissection  Chief complaint/ Reason for visit-routine follow-up of breast cancer post chemotherapy  Heme/Onc history: Patient is a 64 year old female with a past medical history significant for hypertension, CKD, diabetes, nonischemic cardiomyopathy among other medical problems. She has not undergone a mammogram for several years. Mammogram on October 2020 was done after she had a palpable area of concern in the left breast which showed an irregular mass with increased vascularity measuring 3.1 x 2.4 x 4.5 cm. The nodularity extended towards the nipple measuring at least 5.4 cm. Single abnormal lymph node in the left axilla with cortical thickness of 0.7 cm. 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  PET scan showed hypermetabolic left axillary lymph nodes which was reviewed at tumor board and they wereat least found to be 3-4. Retroareolar left breast mass 2.5 x 3.2 cm in size. No evidence of distant metastatic disease. There were small to borderline enlarged retroperitoneal lymph nodes 10 mm in size with an SUV of 4.3.Retroperitoneal lymph  nodes will require follow-up in the future  MRI showed 2 sites of biopsy-proven malignancy in the lateral left breast at posterior and anterior depth. There is non-mass enhancement spanning between the 2 sites. Morphologically abnormal level 1 axillary lymph node consistent with biopsy-proven metastatic disease.  Final pathology showed 2 separate tumors one which was 4.5 cm and the other one was 4.4 cm in size. Grade 2 ER/PR positive HER-2/neu negative with negative margins. 5 out of 11 lymph nodes were positive for malignancy. Extranodal extension present. Largest size of metastatic deposit 14 mm. mpT2 mpN2  Patient is not a candidate for anthracycline-based chemotherapy per cardiology. Adjuvant TC chemotherapy completed on 03/11/2020  Interval history-currently patient feels well and other than fatigue she denies other complaints.  He does have some baseline neuropathy which has remained essentially stable.  ECOG PS- 1 Pain scale- 0 Opioid associated constipation- no  Review of systems- Review of Systems  Constitutional: Positive for malaise/fatigue. Negative for chills, fever and weight loss.  HENT: Negative for congestion, ear discharge and nosebleeds.   Eyes: Negative for blurred vision.  Respiratory: Negative for cough, hemoptysis, sputum production, shortness of breath and wheezing.   Cardiovascular: Negative for chest pain, palpitations, orthopnea and claudication.  Gastrointestinal: Negative for abdominal pain, blood in stool, constipation, diarrhea, heartburn, melena, nausea and vomiting.  Genitourinary: Negative for dysuria, flank pain, frequency, hematuria and urgency.  Musculoskeletal: Negative for back pain, joint pain and myalgias.  Skin: Negative for rash.  Neurological: Negative for dizziness, tingling, focal weakness, seizures, weakness and headaches.  Endo/Heme/Allergies: Does not bruise/bleed easily.  Psychiatric/Behavioral: Negative for depression and suicidal  ideas. The patient does not have insomnia.       Allergies  Allergen Reactions  . Hydralazine  Swelling    Eye Swelling  . Other Other (See Comments)    Unknown blood pressure medication caused face swelling     Past Medical History:  Diagnosis Date  . Arthritis of both feet    Gout  . Cancer (HCC)    Breast Cancer on left  . Chronic combined systolic and diastolic CHF (congestive heart failure) (HCC)    a. Echo (06/2014): Severe LVH, EF 20-25%, no RWMA, Gr 3 DD, trivial MR, mild LAE, mild RVE, PASP 38 mmHg .>> b. Echo 2/16 EF 50-55% //  // c. Echo 02/2019: EF 50-55, mod LVH, Gr 1 DD, mild LAE // Echocardiogram 02/2020: EF 60-65, no RWMA, mild LVH, Gr 1 DD, normal RVSF, RVSP 30.5   . CKD (chronic kidney disease), stage III    now stage 4  . Diabetes mellitus type 2 in obese (HCC)   . Diverticular disease   . Dyspnea   . Family history of breast cancer   . Family history of leukemia   . Family history of lung cancer   . Family history of testicular cancer   . Family history of throat cancer   . Fatty liver   . History of GI diverticular bleed   . Hyperlipidemia   . Hyperphosphatemia   . Hypertension   . Hypertensive heart disease    a. Renal Artery Duplex (8/15):  no RAS  . Iron deficiency anemia   . NICM (nonischemic cardiomyopathy) (HCC)    a. Nuclear (06/2014): EF 32%, apical thinning, no definite scar or ischemia;  b. Echo (2/16):  Mild LVH, EF 50-55%, Gr 2 DD, no RWMA  . Obesity   . PONV (postoperative nausea and vomiting)    after D and C  . Proteinuria   . PUD (peptic ulcer disease)   . Secondary hyperparathyroidism, renal (HCC)   . Tobacco abuse   . UTI (lower urinary tract infection)   . Vaginal dryness, menopausal   . Vaginal yeast infection      Past Surgical History:  Procedure Laterality Date  . COLONOSCOPY    . DILATION AND CURETTAGE OF UTERUS    . MASTECTOMY W/ SENTINEL NODE BIOPSY Left 11/12/2019  . MASTECTOMY W/ SENTINEL NODE BIOPSY Left  11/12/2019   Procedure: LEFT MASTECTOMY WITH SENTINEL LYMPH NODE BIOPSY AND TARGETED NODE DISSECTION;  Surgeon: Toth, Paul III, MD;  Location: [facility] OR;  Service: General;  Laterality: Left;  . None    . PORTA CATH INSERTION N/A 12/11/2019   Procedure: PORTA CATH INSERTION;  Surgeon: Schnier, Gregory G, MD;  Location: [facility] INVASIVE CV LAB;  Service: Cardiovascular;  Laterality: N/A;    Social History   Socioeconomic History  . Marital status: Widowed    Spouse name: Not on file  . Number of children: 2  . Years of education: 7  . Highest education level: Not on file  Occupational History  . Not on file  Tobacco Use  . Smoking status: Former Smoker    Quit date: 07/05/2014    Years since quitting: 5.7  . Smokeless tobacco: Never Used  Substance and Sexual Activity  . Alcohol use: No  . Drug use: No  . Sexual activity: Not on file  Other Topics Concern  . Not on file  Social History Narrative   Currently lives in a house with her husband.    Fun: Watch TV.   Denies religious beliefs effecting health care.    Social Determinants of Health   Financial Resource   Strain:   . Difficulty of Paying Living Expenses:   Food Insecurity:   . Worried About Charity fundraiser in the Last Year:   . Arboriculturist in the Last Year:   Transportation Needs:   . Film/video editor (Medical):   Marland Kitchen Lack of Transportation (Non-Medical):   Physical Activity:   . Days of Exercise per Week:   . Minutes of Exercise per Session:   Stress:   . Feeling of Stress :   Social Connections:   . Frequency of Communication with Friends and Family:   . Frequency of Social Gatherings with Friends and Family:   . Attends Religious Services:   . Active Member of Clubs or Organizations:   . Attends Archivist Meetings:   Marland Kitchen Marital Status:   Intimate Partner Violence:   . Fear of Current or Ex-Partner:   . Emotionally Abused:   Marland Kitchen Physically Abused:   . Sexually Abused:     Family History   Problem Relation Age of Onset  . Heart attack Paternal Grandfather   . Stroke Paternal Grandmother   . Hypertension Paternal Grandmother   . Kidney disease Mother   . Diabetes Mother   . Testicular cancer Father        diagnosed older than 34  . Coronary artery disease Other   . Hypertension Other   . Breast cancer Sister        diagnosed late 56s, negative genetics  . Leukemia Brother        chronic  . Throat cancer Brother   . Pneumonia Sister 2  . Lung cancer Paternal Uncle        diagnosed over 35     Current Outpatient Medications:  .  acetaminophen (TYLENOL) 500 MG tablet, Take 1,000 mg by mouth every 6 (six) hours as needed for mild pain, moderate pain or headache. For pain, Disp: , Rfl:  .  allopurinol (ZYLOPRIM) 100 MG tablet, Take 200 mg by mouth daily., Disp: , Rfl:  .  amLODipine (NORVASC) 5 MG tablet, Take 1 tablet (5 mg total) by mouth daily. (Patient taking differently: Take 5 mg by mouth at bedtime. ), Disp: 90 tablet, Rfl: 3 .  aspirin EC 81 MG EC tablet, Take 1 tablet (81 mg total) by mouth daily., Disp: , Rfl:  .  atorvastatin (LIPITOR) 40 MG tablet, Take 1 tablet (40 mg total) by mouth daily at 6 PM., Disp: 90 tablet, Rfl: 3 .  B-D ULTRAFINE III SHORT PEN 31G X 8 MM MISC, USE AS DIRECTED *EMERGENCY FILL*, Disp: , Rfl: 0 .  blood glucose meter kit and supplies KIT, Dispense based on patient and insurance preference. Use up to four times daily as directed. (FOR ICD-9 250.00, 250.01)., Disp: 1 each, Rfl: 0 .  calcitRIOL (ROCALTROL) 0.25 MCG capsule, Take 0.25 mcg by mouth daily., Disp: , Rfl:  .  cloNIDine (CATAPRES) 0.1 MG tablet, Take 1 tablet (0.1 mg total) by mouth 2 (two) times daily., Disp: 180 tablet, Rfl: 3 .  Colchicine 0.6 MG CAPS, Take 0.6 mg by mouth every other day. , Disp: , Rfl:  .  furosemide (LASIX) 40 MG tablet, Take 40 mg by mouth 2 (two) times daily., Disp: , Rfl:  .  gabapentin (NEURONTIN) 100 MG capsule, Take 2 capsules (200 mg total) by mouth  3 (three) times daily., Disp: 180 capsule, Rfl: 1 .  insulin aspart (NOVOLOG FLEXPEN) 100 UNIT/ML FlexPen, Inject 5 Units into the skin  3 (three) times daily with meals. Additional insulin (5 units PLUS) CBG 70 - 120: 0 units CBG 121 - 150: 1 unit CBG 151 - 200: 2 units CBG 201 - 250: 3 units CBG 251 - 350: 4 units CBG 351 - 400: 6 units (Patient taking differently: Inject 5 Units into the skin 3 (three) times daily with meals. Sliding scale  5 units/ plus base), Disp: 15 mL, Rfl: 11 .  insulin glargine (LANTUS) 100 UNIT/ML injection, Inject 20 Units into the skin at bedtime. , Disp: , Rfl:  .  isosorbide mononitrate (IMDUR) 120 MG 24 hr tablet, TAKE 1/2 TABLET DAILY = 60 MG DAILY (Patient taking differently: Take 60 mg by mouth daily. ), Disp: 45 tablet, Rfl: 3 .  lidocaine-prilocaine (EMLA) cream, Apply to affected area once, Disp: 30 g, Rfl: 3 .  loratadine (CLARITIN) 10 MG tablet, Take 10 mg by mouth daily as needed for allergies (around onpro neulasta with treatment)., Disp: , Rfl:  .  LORazepam (ATIVAN) 0.5 MG tablet, Take 1 tablet (0.5 mg total) by mouth every 6 (six) hours as needed (Nausea or vomiting)., Disp: 30 tablet, Rfl: 0 .  Menthol, Topical Analgesic, (ICY HOT EX), Apply 1 application topically daily as needed (pain)., Disp: , Rfl:  .  metoprolol tartrate (LOPRESSOR) 25 MG tablet, Take 1 tablet (25 mg total) by mouth 2 (two) times daily., Disp: 180 tablet, Rfl: 3 .  ondansetron (ZOFRAN) 8 MG tablet, Take 1 tablet (8 mg total) by mouth 2 (two) times daily as needed for refractory nausea / vomiting. Start on day 3 after chemo., Disp: 30 tablet, Rfl: 1 .  ONE TOUCH ULTRA TEST test strip, 1 each by Other route 4 (four) times daily. , Disp: , Rfl: 1 .  ONETOUCH DELICA LANCETS 33G MISC, Inject 33 g into the skin 4 (four) times daily. , Disp: , Rfl: 1 .  polyethylene glycol (MIRALAX / GLYCOLAX) 17 g packet, Take 17 g by mouth daily as needed for moderate constipation., Disp: , Rfl:  .   prochlorperazine (COMPAZINE) 10 MG tablet, Take 1 tablet (10 mg total) by mouth every 6 (six) hours as needed (Nausea or vomiting)., Disp: 30 tablet, Rfl: 1 .  telmisartan (MICARDIS) 20 MG tablet, Take 1 tablet (20 mg total) by mouth daily. (Patient taking differently: Take 20 mg by mouth at bedtime. ), Disp: 90 tablet, Rfl: 3 .  triamcinolone ointment (KENALOG) 0.5 %, Apply 1 application topically 3 (three) times daily., Disp: 30 g, Rfl: 2  Physical exam:  Vitals:   03/31/20 1019  BP: 102/80  Pulse: 67  Resp: 18  Temp: (!) 96.3 F (35.7 C)  TempSrc: Tympanic  SpO2: 98%  Weight: 192 lb 8 oz (87.3 kg)   Physical Exam Constitutional:      General: She is not in acute distress.    Comments: Patient ambulates with a cane  HENT:     Head: Normocephalic and atraumatic.  Eyes:     Pupils: Pupils are equal, round, and reactive to light.  Cardiovascular:     Rate and Rhythm: Normal rate and regular rhythm.     Heart sounds: Normal heart sounds.  Pulmonary:     Effort: Pulmonary effort is normal.     Breath sounds: Normal breath sounds.  Abdominal:     General: Bowel sounds are normal.     Palpations: Abdomen is soft.  Musculoskeletal:     Cervical back: Normal range of motion.  Skin:      General: Skin is warm and dry.  Neurological:     Mental Status: She is alert and oriented to person, place, and time.      CMP Latest Ref Rng & Units 03/31/2020  Glucose 70 - 99 mg/dL 126(H)  BUN 8 - 23 mg/dL 31(H)  Creatinine 0.44 - 1.00 mg/dL 1.55(H)  Sodium 135 - 145 mmol/L 142  Potassium 3.5 - 5.1 mmol/L 4.8  Chloride 98 - 111 mmol/L 106  CO2 22 - 32 mmol/L 28  Calcium 8.9 - 10.3 mg/dL 8.9  Total Protein 6.5 - 8.1 g/dL 6.5  Total Bilirubin 0.3 - 1.2 mg/dL 0.7  Alkaline Phos 38 - 126 U/L 108  AST 15 - 41 U/L 17  ALT 0 - 44 U/L 17   CBC Latest Ref Rng & Units 03/31/2020  WBC 4.0 - 10.5 K/uL 8.6  Hemoglobin 12.0 - 15.0 g/dL 10.7(L)  Hematocrit 36.0 - 46.0 % 33.6(L)  Platelets 150 -  400 K/uL 184     Assessment and plan- Patient is a 64 y.o. female with pathological prognostic stage Ib invasive lobular carcinoma pT2 pN2 acM0 ER/PR positive HER-2/neu negative s/pleft mastectomy and targeted node dissection.  She has completed 4 cycles of adjuvant TC chemotherapy and is here to discuss further management  Patient has toleratedAdjuvant TC chemotherapy well.  She still has baseline anemia likely secondary from chemotherapy which is likely to improve over the next few weeks.  Kidney functions remained stable.  Does not require any IV fluids today.  At this time I will refer her to radiation oncology for adjuvant radiation.  Given that her tumor was ER PR positive hormone therapy is indicated at this time.  Idiscussed the role for hormone therapy. Given that she is postmenopausal I would favor 5 years of adjuvant hormone therapy with aromatase inhibitor. I discussed the risks and benefits of letrozole including all but not limited to fatigue, hypercholesterolemia, hot flashes, arthralgias and worsening bone health.  Patient will also need to be on calcium 1200 mg along with vitamin D 800 international units.  We will obtain a baseline bone density scan written information about letrozole given to the patient. I would like her to finish radiation therapy and start hormone therapy thereafter. Patient verbalized understanding and agrees to proceed  She will also benefit from adjuvant zometa and based on her present renal functions, she can still get it.  Discussed risks and benefits Zometa including all but not limited to fatigue, hypocalcemia and risk of osteonecrosis of the jaw.  Patient understands and agrees to proceed as planned.   I will see her back in 3-1/2 months to see how she is tolerating letrozole and potentially get her Zometa on that day      Visit Diagnosis 1. Malignant neoplasm of overlapping sites of left breast in female, estrogen receptor positive ([place])   2. Goals  of care, counseling/discussion   3. Anemia due to antineoplastic chemotherapy      Dr. Randa Evens, MD, MPH [facility] [phone] 04/02/2020 6:16 PM

## 2020-04-04 ENCOUNTER — Other Ambulatory Visit: Payer: Self-pay | Admitting: *Deleted

## 2020-04-04 DIAGNOSIS — Z170 Estrogen receptor positive status [ER+]: Secondary | ICD-10-CM

## 2020-04-04 DIAGNOSIS — C50812 Malignant neoplasm of overlapping sites of left female breast: Secondary | ICD-10-CM

## 2020-04-07 ENCOUNTER — Ambulatory Visit: Payer: PPO

## 2020-04-07 ENCOUNTER — Telehealth: Payer: Self-pay | Admitting: Physician Assistant

## 2020-04-07 DIAGNOSIS — C50812 Malignant neoplasm of overlapping sites of left female breast: Secondary | ICD-10-CM | POA: Diagnosis not present

## 2020-04-07 DIAGNOSIS — Z510 Encounter for antineoplastic radiation therapy: Secondary | ICD-10-CM | POA: Diagnosis not present

## 2020-04-07 NOTE — Telephone Encounter (Signed)
I spoke to the patient who earlier today 1:30 pm had an episode of a BP 74/46 HR 67 and a feeling of "sweating, felt on fire, ears ringing and dizzy."  It was approximately two hours after her morning medications.  She denies any CP or SOB and has stayed well hydrated.    She feels much better now BP 102/69 HR 67.  She will hold evening BP medications and continue to monitor BP/HR/symptoms.  She will reach out tomorrow morning 4/20 with an update.  She said that her Amlodipine was stopped recently by her Nephrologist, because of low BP.

## 2020-04-07 NOTE — Telephone Encounter (Signed)
Pt c/o BP issue: STAT if pt c/o blurred vision, one-sided weakness or slurred speech  1. What are your last 5 BP readings?  Patient states BP readings are not consistent  119/60 74/46 103/50 120/69 110/80  2. Are you having any other symptoms (ex. Dizziness, headache, blurred vision, passed out)? Sweating  3. What is your BP issue? Patient states BP has been extremely low.

## 2020-04-08 ENCOUNTER — Ambulatory Visit: Payer: PPO

## 2020-04-08 ENCOUNTER — Ambulatory Visit: Admission: RE | Admit: 2020-04-08 | Payer: PPO | Source: Ambulatory Visit

## 2020-04-08 DIAGNOSIS — C50812 Malignant neoplasm of overlapping sites of left female breast: Secondary | ICD-10-CM | POA: Diagnosis not present

## 2020-04-08 DIAGNOSIS — Z510 Encounter for antineoplastic radiation therapy: Secondary | ICD-10-CM | POA: Diagnosis not present

## 2020-04-08 NOTE — Telephone Encounter (Signed)
Left message to call back for more information. 

## 2020-04-08 NOTE — Telephone Encounter (Signed)
Patient calling back. Patient states if she does not answer the phone to leave her a message.

## 2020-04-08 NOTE — Telephone Encounter (Signed)
  Patient is returning call from [place]. Attempted to contact nurse.

## 2020-04-08 NOTE — Telephone Encounter (Signed)
The patient reports she did not take her BP meds last night or any meds today other than her insulin. Her BP today is 137/80 and HR 73. Confirmed medications with patient. Her Nephrologist took her off amlodipine several weeks ago. Med list updated.  She does not want to take meds until she is called with recommendations.   To Dr. Percival Spanish for recommendations.

## 2020-04-09 ENCOUNTER — Telehealth: Payer: Self-pay | Admitting: *Deleted

## 2020-04-09 ENCOUNTER — Ambulatory Visit
Admission: RE | Admit: 2020-04-09 | Discharge: 2020-04-09 | Disposition: A | Discharge status: Home / Self Care | Payer: PPO | Source: Ambulatory Visit | Attending: Radiation Oncology | Admitting: Radiation Oncology

## 2020-04-09 DIAGNOSIS — Z510 Encounter for antineoplastic radiation therapy: Secondary | ICD-10-CM | POA: Diagnosis not present

## 2020-04-09 DIAGNOSIS — C50812 Malignant neoplasm of overlapping sites of left female breast: Secondary | ICD-10-CM | POA: Diagnosis not present

## 2020-04-09 MED ORDER — METOPROLOL TARTRATE 25 MG PO TABS: 12.5000 mg | ORAL_TABLET | Freq: Two times a day (BID) | ORAL | 3 refills | Status: DC

## 2020-04-09 MED ORDER — FUROSEMIDE 40 MG PO TABS: 40.0000 mg | ORAL_TABLET | Freq: Every day | ORAL | Status: DC | PRN

## 2020-04-09 NOTE — Telephone Encounter (Signed)
Low BP probably related to volume loss from n/v related to chemotherapy.  She has been on Clonidine and Metoprolol.  I am concerned about stopping these abruptly causing rebound hypertension (moreso with clonidine than metoprolol).   Resume Clonidine 0.1 once daily for 3 days, then every other day for total of 2 doses, then stop.  Resume Metoprolol at 12.5 mg twice daily.  Once Clonidine is stopped resume Telmisartan only if BP >/= 130/80.  Hold Isosorbide for now.  Monitor weight and leg swelling.  If weight increases 3 lbs or more in 1 day or leg swelling significantly increases, take Furosemide 40 mg once.  Call if BP runs 130/80 or higher.  Richardson Dopp, PA-C    04/09/2020 5:01 PM

## 2020-04-09 NOTE — Telephone Encounter (Signed)
I called patient yesterday asking if she has a dentist in mind so that we can get clearance for her to start on Zometa.  Patient states she does not have a dentist.  Asked her if she would like me to find her one.  He says she does not want a dentist and her teeth are really in bad shape.  Wanted to know if Dr. Janese Banks would be okay if she does not get a dentist check this out.  And she says as long as she understands that she has a risks of the osteonecrosis of the jaw when people are on Zometa then she is fine to proceed.  When I called back to let the patient know Dr. Elroy Channel decision she was not at home and I have left a detailed message on her voicemail for her to call me back in about we will be able to start the Zometa as long as she is taking into consideration the risk of osteonecrosis and wants to proceed

## 2020-04-09 NOTE — Telephone Encounter (Signed)
Instructed [name] to: 1) STOP ISOSORBIDE 2) STOP TELMISARTAN 3) Resume Clonidine 0.1 mg daily. She will take W, [place], F, Sun, Tues then STOP 4) RESUME metoprolol 12.5 mg BID 5) She will monitor leg swelling and weight and take Lasix PRN 6) She understands to monitor BP. She will call if her BP is consistently over 130/80 and understands Telmisartan will be restarted if so  She was grateful for call and agrees with treatment plan.

## 2020-04-10 ENCOUNTER — Telehealth: Payer: Self-pay | Admitting: *Deleted

## 2020-04-10 ENCOUNTER — Ambulatory Visit
Admission: RE | Admit: 2020-04-10 | Discharge: 2020-04-10 | Disposition: A | Discharge status: Home / Self Care | Payer: PPO | Source: Ambulatory Visit | Attending: Radiation Oncology | Admitting: Radiation Oncology

## 2020-04-10 ENCOUNTER — Telehealth: Payer: Self-pay | Admitting: Physician Assistant

## 2020-04-10 DIAGNOSIS — Z510 Encounter for antineoplastic radiation therapy: Secondary | ICD-10-CM | POA: Diagnosis not present

## 2020-04-10 NOTE — Telephone Encounter (Signed)
I spoke to the patient who was instructed on medication changes on  4/21.  She took her BP today and it was 183/99, asymptomatic.  She will continue monitoring over the next few days and update [place] on recorded Bps.    She verbalized understanding that if symptoms occur, she should call us sooner.

## 2020-04-10 NOTE — Telephone Encounter (Signed)
Pt called and said that she is good to get zometa and understands the slight chance of ONJ. We will move forward. Her appt is in July after her radiation. I told that Janese Banks is ok with and we will give her zometa. She also asked about letrozole and wanted to know when she starts that. I told her she will start the med after completion of the radiation and then the next week for her to start the pills. She is agreeable to the plan above

## 2020-04-10 NOTE — Telephone Encounter (Signed)
See previous phone note. Patients states that there was some changes to her medication and she was told to call in if her BP got above 130. She reports her BP is 183/99. She denies any symptoms at this time.

## 2020-04-11 ENCOUNTER — Ambulatory Visit
Admission: RE | Admit: 2020-04-11 | Discharge: 2020-04-11 | Disposition: A | Discharge status: Home / Self Care | Payer: PPO | Source: Ambulatory Visit | Attending: Radiation Oncology | Admitting: Radiation Oncology

## 2020-04-11 DIAGNOSIS — C50812 Malignant neoplasm of overlapping sites of left female breast: Secondary | ICD-10-CM | POA: Diagnosis not present

## 2020-04-11 DIAGNOSIS — Z510 Encounter for antineoplastic radiation therapy: Secondary | ICD-10-CM | POA: Diagnosis not present

## 2020-04-14 ENCOUNTER — Ambulatory Visit
Admission: RE | Admit: 2020-04-14 | Discharge: 2020-04-14 | Disposition: A | Discharge status: Home / Self Care | Payer: PPO | Source: Ambulatory Visit | Attending: Radiation Oncology | Admitting: Radiation Oncology

## 2020-04-14 DIAGNOSIS — C50812 Malignant neoplasm of overlapping sites of left female breast: Secondary | ICD-10-CM | POA: Diagnosis not present

## 2020-04-14 DIAGNOSIS — Z510 Encounter for antineoplastic radiation therapy: Secondary | ICD-10-CM | POA: Diagnosis not present

## 2020-04-15 ENCOUNTER — Ambulatory Visit
Admission: RE | Admit: 2020-04-15 | Discharge: 2020-04-15 | Disposition: A | Discharge status: Home / Self Care | Payer: PPO | Source: Ambulatory Visit | Attending: Radiation Oncology | Admitting: Radiation Oncology

## 2020-04-15 ENCOUNTER — Telehealth: Payer: Self-pay | Admitting: Physician Assistant

## 2020-04-15 DIAGNOSIS — C50812 Malignant neoplasm of overlapping sites of left female breast: Secondary | ICD-10-CM | POA: Diagnosis not present

## 2020-04-15 DIAGNOSIS — Z510 Encounter for antineoplastic radiation therapy: Secondary | ICD-10-CM | POA: Diagnosis not present

## 2020-04-15 NOTE — Telephone Encounter (Signed)
If she has not already, resume Telmisartan 20 mg once daily. If she has already done that, resume Clonidine 0.1 mg twice daily.  Monitor BP and call with follow up readings Friday AM. Richardson Dopp, PA-C    04/15/2020 9:00 PM

## 2020-04-15 NOTE — Telephone Encounter (Signed)
   Pt c/o BP issue: STAT if pt c/o blurred vision, one-sided weakness or slurred speech  1. What are your last 5 BP readings? 180/95, 170/90  2. Are you having any other symptoms (ex. Dizziness, headache, blurred vision, passed out)? No other symptoms  3. What is your BP issue? Pt said Nicky Pugh made adjustment with her medications, and now her BP been really high since last week, she said she have chemo 5 days a week, she went today and nurse said her BP is too high and to call her heart doctor  Please call

## 2020-04-15 NOTE — Telephone Encounter (Signed)
I spoke with patient who reports the following readings- Today at chemotherapy -170/94 This AM 3 hours after taking AM medications-180/95 4/24- 2.5 hours after taking AM medications-183/99 4/23- 167/92,158/89- several hours after taking AM medications Heart rate 62-73. Med changes made in recent phone note due to low BP.  Patient reports she took last dose clonidine yesterday and is currently taking metoprolol tartrate 12.5 mg twice daily.

## 2020-04-16 ENCOUNTER — Ambulatory Visit
Admission: RE | Admit: 2020-04-16 | Discharge: 2020-04-16 | Disposition: A | Discharge status: Home / Self Care | Payer: PPO | Source: Ambulatory Visit | Attending: Radiation Oncology | Admitting: Radiation Oncology

## 2020-04-16 DIAGNOSIS — C50812 Malignant neoplasm of overlapping sites of left female breast: Secondary | ICD-10-CM | POA: Diagnosis not present

## 2020-04-16 DIAGNOSIS — Z510 Encounter for antineoplastic radiation therapy: Secondary | ICD-10-CM | POA: Diagnosis not present

## 2020-04-16 NOTE — Telephone Encounter (Signed)
I called and spoke with patient, she states that she has already restarted Telmisartan 20 mg 1 tablet by mouth once a day. Patient will restart Clonidine 0.1 mg, 1 tablet by mouth twice a day. Patient will continue to monitor blood pressures and call Friday morning with an update on readings.

## 2020-04-17 ENCOUNTER — Ambulatory Visit
Admission: RE | Admit: 2020-04-17 | Discharge: 2020-04-17 | Disposition: A | Discharge status: Home / Self Care | Payer: PPO | Source: Ambulatory Visit | Attending: Radiation Oncology | Admitting: Radiation Oncology

## 2020-04-17 DIAGNOSIS — Z510 Encounter for antineoplastic radiation therapy: Secondary | ICD-10-CM | POA: Diagnosis not present

## 2020-04-17 DIAGNOSIS — C50812 Malignant neoplasm of overlapping sites of left female breast: Secondary | ICD-10-CM | POA: Diagnosis not present

## 2020-04-18 ENCOUNTER — Telehealth: Payer: Self-pay | Admitting: Physician Assistant

## 2020-04-18 ENCOUNTER — Ambulatory Visit
Admission: RE | Admit: 2020-04-18 | Discharge: 2020-04-18 | Disposition: A | Discharge status: Home / Self Care | Payer: PPO | Source: Ambulatory Visit | Attending: Radiation Oncology | Admitting: Radiation Oncology

## 2020-04-18 DIAGNOSIS — Z510 Encounter for antineoplastic radiation therapy: Secondary | ICD-10-CM | POA: Diagnosis not present

## 2020-04-18 NOTE — Telephone Encounter (Signed)
I assume she meant 169/86. Restart Isosorbide 60 mg once daily  Monitor BP x 1 week and send readings for review. Richardson Dopp, PA-C    04/18/2020 12:54 PM

## 2020-04-18 NOTE — Telephone Encounter (Signed)
See 04/15/20 phone note. Patient called with updated BP reading. 86/169 HR 51  Patient has chemo today so if she does not answer, please leave a message.

## 2020-04-18 NOTE — Telephone Encounter (Signed)
I called and left patient a message to call back. 

## 2020-04-18 NOTE — Telephone Encounter (Signed)
I have s/w triage to confirm if BP was felt to be entered in wrong. Ok to send to covering for [employer], [facility].

## 2020-04-18 NOTE — Telephone Encounter (Signed)
Error

## 2020-04-21 ENCOUNTER — Ambulatory Visit
Admission: RE | Admit: 2020-04-21 | Discharge: 2020-04-21 | Disposition: A | Discharge status: Home / Self Care | Payer: PPO | Source: Ambulatory Visit | Attending: Radiation Oncology | Admitting: Radiation Oncology

## 2020-04-21 ENCOUNTER — Telehealth: Payer: Self-pay

## 2020-04-21 DIAGNOSIS — Z170 Estrogen receptor positive status [ER+]: Secondary | ICD-10-CM | POA: Insufficient documentation

## 2020-04-21 DIAGNOSIS — Z9012 Acquired absence of left breast and nipple: Secondary | ICD-10-CM | POA: Diagnosis not present

## 2020-04-21 DIAGNOSIS — C50812 Malignant neoplasm of overlapping sites of left female breast: Secondary | ICD-10-CM | POA: Insufficient documentation

## 2020-04-21 DIAGNOSIS — Z510 Encounter for antineoplastic radiation therapy: Secondary | ICD-10-CM | POA: Diagnosis not present

## 2020-04-21 MED ORDER — ISOSORBIDE MONONITRATE ER 60 MG PO TB24: 60.0000 mg | ORAL_TABLET | Freq: Every day | ORAL | 3 refills | Status: DC

## 2020-04-21 NOTE — Telephone Encounter (Signed)
I called and spoke with patient, she is aware to resume Isosorbide 60 mg, 1 tablet by mouth once a day. Patient will continue to monitor blood pressures and send an update Wednesday.

## 2020-04-21 NOTE — Telephone Encounter (Signed)
I called and spoke with patient, she did resume Telmisartan 20 mg once a day and Clonidine 0.1 mg twice a day. Her blood pressures are 180/95, 165/91 on 04/15/20, 190/102, 192/103 on 04/16/20, 176/97, 185/93 on 04/18/20, 167/100, 169/91 on 04/19/20, and 159/92, 169/91 on 04/20/20. Her heart rate has been averaging 54-69, she states a couple of days it was 73. She does agree that going back on the Isosorbide 60 mg would be a good idea. I advised patient I would call her back if this is still what you would like her to add that.

## 2020-04-21 NOTE — Telephone Encounter (Signed)
Yes. Resume Isosorbide 60 mg daily.  Richardson Dopp, PA-C 10:40 04/21/2020

## 2020-04-21 NOTE — Telephone Encounter (Signed)
Please call the patient and get an update on her BP. There have been multiple med changes recently.  Please make sure her med list in her chart is updated with how she is currently taking her medications. Richardson Dopp, PA-C    04/21/2020 8:16 AM

## 2020-04-22 ENCOUNTER — Ambulatory Visit
Admission: RE | Admit: 2020-04-22 | Discharge: 2020-04-22 | Disposition: A | Discharge status: Home / Self Care | Payer: PPO | Source: Ambulatory Visit | Attending: Radiation Oncology | Admitting: Radiation Oncology

## 2020-04-22 DIAGNOSIS — C50812 Malignant neoplasm of overlapping sites of left female breast: Secondary | ICD-10-CM | POA: Diagnosis not present

## 2020-04-22 DIAGNOSIS — Z510 Encounter for antineoplastic radiation therapy: Secondary | ICD-10-CM | POA: Diagnosis not present

## 2020-04-23 ENCOUNTER — Ambulatory Visit
Admission: RE | Admit: 2020-04-23 | Discharge: 2020-04-23 | Disposition: A | Discharge status: Home / Self Care | Payer: PPO | Source: Ambulatory Visit | Attending: Radiation Oncology | Admitting: Radiation Oncology

## 2020-04-23 ENCOUNTER — Other Ambulatory Visit: Payer: Self-pay

## 2020-04-23 ENCOUNTER — Inpatient Hospital Stay: Payer: PPO | Attending: Radiation Oncology

## 2020-04-23 ENCOUNTER — Ambulatory Visit
Admission: RE | Admit: 2020-04-23 | Discharge: 2020-04-23 | Disposition: A | Discharge status: Home / Self Care | Payer: PPO | Source: Ambulatory Visit | Attending: Oncology | Admitting: Oncology

## 2020-04-23 DIAGNOSIS — Z803 Family history of malignant neoplasm of breast: Secondary | ICD-10-CM | POA: Insufficient documentation

## 2020-04-23 DIAGNOSIS — C773 Secondary and unspecified malignant neoplasm of axilla and upper limb lymph nodes: Secondary | ICD-10-CM | POA: Insufficient documentation

## 2020-04-23 DIAGNOSIS — C50812 Malignant neoplasm of overlapping sites of left female breast: Secondary | ICD-10-CM

## 2020-04-23 DIAGNOSIS — Z7982 Long term (current) use of aspirin: Secondary | ICD-10-CM | POA: Insufficient documentation

## 2020-04-23 DIAGNOSIS — Z8249 Family history of ischemic heart disease and other diseases of the circulatory system: Secondary | ICD-10-CM | POA: Insufficient documentation

## 2020-04-23 DIAGNOSIS — N184 Chronic kidney disease, stage 4 (severe): Secondary | ICD-10-CM | POA: Insufficient documentation

## 2020-04-23 DIAGNOSIS — Z9012 Acquired absence of left breast and nipple: Secondary | ICD-10-CM | POA: Diagnosis not present

## 2020-04-23 DIAGNOSIS — Z806 Family history of leukemia: Secondary | ICD-10-CM | POA: Diagnosis not present

## 2020-04-23 DIAGNOSIS — Z794 Long term (current) use of insulin: Secondary | ICD-10-CM | POA: Diagnosis not present

## 2020-04-23 DIAGNOSIS — Z79899 Other long term (current) drug therapy: Secondary | ICD-10-CM | POA: Insufficient documentation

## 2020-04-23 DIAGNOSIS — Z87891 Personal history of nicotine dependence: Secondary | ICD-10-CM | POA: Diagnosis not present

## 2020-04-23 DIAGNOSIS — M85851 Other specified disorders of bone density and structure, right thigh: Secondary | ICD-10-CM | POA: Insufficient documentation

## 2020-04-23 DIAGNOSIS — Z780 Asymptomatic menopausal state: Secondary | ICD-10-CM | POA: Diagnosis not present

## 2020-04-23 DIAGNOSIS — Z170 Estrogen receptor positive status [ER+]: Secondary | ICD-10-CM | POA: Insufficient documentation

## 2020-04-23 DIAGNOSIS — E119 Type 2 diabetes mellitus without complications: Secondary | ICD-10-CM | POA: Insufficient documentation

## 2020-04-23 DIAGNOSIS — Z8043 Family history of malignant neoplasm of testis: Secondary | ICD-10-CM | POA: Insufficient documentation

## 2020-04-23 DIAGNOSIS — Z801 Family history of malignant neoplasm of trachea, bronchus and lung: Secondary | ICD-10-CM | POA: Diagnosis not present

## 2020-04-23 DIAGNOSIS — Z510 Encounter for antineoplastic radiation therapy: Secondary | ICD-10-CM | POA: Diagnosis not present

## 2020-04-23 LAB — CBC WITH DIFFERENTIAL/PLATELET
Abs Immature Granulocytes: 0.01 10*3/uL (ref 0.00–0.07)
Basophils Absolute: 0 10*3/uL (ref 0.0–0.1)
Basophils Relative: 1 %
Eosinophils Absolute: 0.4 10*3/uL (ref 0.0–0.5)
Eosinophils Relative: 7 %
HCT: 37.5 % (ref 36.0–46.0)
Hemoglobin: 12.2 g/dL (ref 12.0–15.0)
Immature Granulocytes: 0 %
Lymphocytes Relative: 10 %
Lymphs Abs: 0.5 10*3/uL — ABNORMAL LOW (ref 0.7–4.0)
MCH: 29.3 pg (ref 26.0–34.0)
MCHC: 32.5 g/dL (ref 30.0–36.0)
MCV: 90.1 fL (ref 80.0–100.0)
Monocytes Absolute: 0.5 10*3/uL (ref 0.1–1.0)
Monocytes Relative: 10 %
Neutro Abs: 3.6 10*3/uL (ref 1.7–7.7)
Neutrophils Relative %: 72 %
Platelets: 160 10*3/uL (ref 150–400)
RBC: 4.16 MIL/uL (ref 3.87–5.11)
RDW: 16.2 % — ABNORMAL HIGH (ref 11.5–15.5)
WBC: 5 10*3/uL (ref 4.0–10.5)
nRBC: 0 % (ref 0.0–0.2)

## 2020-04-23 LAB — COMPREHENSIVE METABOLIC PANEL
ALT: 12 U/L (ref 0–44)
AST: 15 U/L (ref 15–41)
Albumin: 3.8 g/dL (ref 3.5–5.0)
Alkaline Phosphatase: 100 U/L (ref 38–126)
Anion gap: 9 (ref 5–15)
BUN: 42 mg/dL — ABNORMAL HIGH (ref 8–23)
CO2: 26 mmol/L (ref 22–32)
Calcium: 8.5 mg/dL — ABNORMAL LOW (ref 8.9–10.3)
Chloride: 104 mmol/L (ref 98–111)
Creatinine, Ser: 1.72 mg/dL — ABNORMAL HIGH (ref 0.44–1.00)
GFR calc Af Amer: 36 mL/min — ABNORMAL LOW (ref >60)
GFR calc non Af Amer: 31 mL/min — ABNORMAL LOW (ref >60)
Glucose, Bld: 140 mg/dL — ABNORMAL HIGH (ref 70–99)
Potassium: 4.4 mmol/L (ref 3.5–5.1)
Sodium: 139 mmol/L (ref 135–145)
Total Bilirubin: 0.8 mg/dL (ref 0.3–1.2)
Total Protein: 6.8 g/dL (ref 6.5–8.1)

## 2020-04-24 ENCOUNTER — Ambulatory Visit
Admission: RE | Admit: 2020-04-24 | Discharge: 2020-04-24 | Disposition: A | Discharge status: Home / Self Care | Payer: PPO | Source: Ambulatory Visit | Attending: Radiation Oncology | Admitting: Radiation Oncology

## 2020-04-24 DIAGNOSIS — C50812 Malignant neoplasm of overlapping sites of left female breast: Secondary | ICD-10-CM | POA: Diagnosis not present

## 2020-04-24 DIAGNOSIS — Z510 Encounter for antineoplastic radiation therapy: Secondary | ICD-10-CM | POA: Diagnosis not present

## 2020-04-25 ENCOUNTER — Ambulatory Visit
Admission: RE | Admit: 2020-04-25 | Discharge: 2020-04-25 | Disposition: A | Discharge status: Home / Self Care | Payer: PPO | Source: Ambulatory Visit | Attending: Radiation Oncology | Admitting: Radiation Oncology

## 2020-04-25 ENCOUNTER — Telehealth: Payer: Self-pay | Admitting: Physician Assistant

## 2020-04-25 DIAGNOSIS — C50812 Malignant neoplasm of overlapping sites of left female breast: Secondary | ICD-10-CM | POA: Diagnosis not present

## 2020-04-25 DIAGNOSIS — Z510 Encounter for antineoplastic radiation therapy: Secondary | ICD-10-CM | POA: Diagnosis not present

## 2020-04-25 NOTE — Telephone Encounter (Signed)
FMLA/disability form received from [employer]. Placed in box for Richardson Dopp, [facility] to review. 04/25/20 vlm

## 2020-04-28 ENCOUNTER — Ambulatory Visit
Admission: RE | Admit: 2020-04-28 | Discharge: 2020-04-28 | Disposition: A | Discharge status: Home / Self Care | Payer: PPO | Source: Ambulatory Visit | Attending: Radiation Oncology | Admitting: Radiation Oncology

## 2020-04-28 DIAGNOSIS — Z510 Encounter for antineoplastic radiation therapy: Secondary | ICD-10-CM | POA: Diagnosis not present

## 2020-04-28 DIAGNOSIS — C50812 Malignant neoplasm of overlapping sites of left female breast: Secondary | ICD-10-CM | POA: Diagnosis not present

## 2020-04-29 ENCOUNTER — Ambulatory Visit
Admission: RE | Admit: 2020-04-29 | Discharge: 2020-04-29 | Disposition: A | Discharge status: Home / Self Care | Payer: PPO | Source: Ambulatory Visit | Attending: Radiation Oncology | Admitting: Radiation Oncology

## 2020-04-29 DIAGNOSIS — C50812 Malignant neoplasm of overlapping sites of left female breast: Secondary | ICD-10-CM | POA: Diagnosis not present

## 2020-04-29 DIAGNOSIS — Z510 Encounter for antineoplastic radiation therapy: Secondary | ICD-10-CM | POA: Diagnosis not present

## 2020-04-30 ENCOUNTER — Ambulatory Visit
Admission: RE | Admit: 2020-04-30 | Discharge: 2020-04-30 | Disposition: A | Discharge status: Home / Self Care | Payer: PPO | Source: Ambulatory Visit | Attending: Radiation Oncology | Admitting: Radiation Oncology

## 2020-04-30 DIAGNOSIS — C50812 Malignant neoplasm of overlapping sites of left female breast: Secondary | ICD-10-CM | POA: Diagnosis not present

## 2020-04-30 DIAGNOSIS — Z510 Encounter for antineoplastic radiation therapy: Secondary | ICD-10-CM | POA: Diagnosis not present

## 2020-05-01 ENCOUNTER — Ambulatory Visit
Admission: RE | Admit: 2020-05-01 | Discharge: 2020-05-01 | Disposition: A | Discharge status: Home / Self Care | Payer: PPO | Source: Ambulatory Visit | Attending: Radiation Oncology | Admitting: Radiation Oncology

## 2020-05-01 DIAGNOSIS — Z510 Encounter for antineoplastic radiation therapy: Secondary | ICD-10-CM | POA: Diagnosis not present

## 2020-05-02 ENCOUNTER — Ambulatory Visit
Admission: RE | Admit: 2020-05-02 | Discharge: 2020-05-02 | Disposition: A | Discharge status: Home / Self Care | Payer: PPO | Source: Ambulatory Visit | Attending: Radiation Oncology | Admitting: Radiation Oncology

## 2020-05-02 DIAGNOSIS — Z510 Encounter for antineoplastic radiation therapy: Secondary | ICD-10-CM | POA: Diagnosis not present

## 2020-05-02 DIAGNOSIS — C50812 Malignant neoplasm of overlapping sites of left female breast: Secondary | ICD-10-CM | POA: Diagnosis not present

## 2020-05-05 ENCOUNTER — Ambulatory Visit
Admission: RE | Admit: 2020-05-05 | Discharge: 2020-05-05 | Disposition: A | Discharge status: Home / Self Care | Payer: PPO | Source: Ambulatory Visit | Attending: Radiation Oncology | Admitting: Radiation Oncology

## 2020-05-05 DIAGNOSIS — Z510 Encounter for antineoplastic radiation therapy: Secondary | ICD-10-CM | POA: Diagnosis not present

## 2020-05-05 DIAGNOSIS — C50812 Malignant neoplasm of overlapping sites of left female breast: Secondary | ICD-10-CM | POA: Diagnosis not present

## 2020-05-06 ENCOUNTER — Ambulatory Visit
Admission: RE | Admit: 2020-05-06 | Discharge: 2020-05-06 | Disposition: A | Discharge status: Home / Self Care | Payer: PPO | Source: Ambulatory Visit | Attending: Radiation Oncology | Admitting: Radiation Oncology

## 2020-05-06 DIAGNOSIS — C50812 Malignant neoplasm of overlapping sites of left female breast: Secondary | ICD-10-CM | POA: Diagnosis not present

## 2020-05-06 DIAGNOSIS — Z510 Encounter for antineoplastic radiation therapy: Secondary | ICD-10-CM | POA: Diagnosis not present

## 2020-05-07 ENCOUNTER — Other Ambulatory Visit: Payer: Self-pay

## 2020-05-07 ENCOUNTER — Ambulatory Visit
Admission: RE | Admit: 2020-05-07 | Discharge: 2020-05-07 | Disposition: A | Discharge status: Home / Self Care | Payer: PPO | Source: Ambulatory Visit | Attending: Radiation Oncology | Admitting: Radiation Oncology

## 2020-05-07 ENCOUNTER — Inpatient Hospital Stay: Payer: PPO

## 2020-05-07 DIAGNOSIS — Z170 Estrogen receptor positive status [ER+]: Secondary | ICD-10-CM

## 2020-05-07 DIAGNOSIS — C50812 Malignant neoplasm of overlapping sites of left female breast: Secondary | ICD-10-CM

## 2020-05-07 DIAGNOSIS — Z510 Encounter for antineoplastic radiation therapy: Secondary | ICD-10-CM | POA: Diagnosis not present

## 2020-05-07 LAB — CBC WITH DIFFERENTIAL/PLATELET
Abs Immature Granulocytes: 0.01 10*3/uL (ref 0.00–0.07)
Basophils Absolute: 0 10*3/uL (ref 0.0–0.1)
Basophils Relative: 1 %
Eosinophils Absolute: 0.4 10*3/uL (ref 0.0–0.5)
Eosinophils Relative: 9 %
HCT: 42 % (ref 36.0–46.0)
Hemoglobin: 13.6 g/dL (ref 12.0–15.0)
Immature Granulocytes: 0 %
Lymphocytes Relative: 9 %
Lymphs Abs: 0.4 10*3/uL — ABNORMAL LOW (ref 0.7–4.0)
MCH: 28.6 pg (ref 26.0–34.0)
MCHC: 32.4 g/dL (ref 30.0–36.0)
MCV: 88.4 fL (ref 80.0–100.0)
Monocytes Absolute: 0.4 10*3/uL (ref 0.1–1.0)
Monocytes Relative: 9 %
Neutro Abs: 3.1 10*3/uL (ref 1.7–7.7)
Neutrophils Relative %: 72 %
Platelets: 153 10*3/uL (ref 150–400)
RBC: 4.75 MIL/uL (ref 3.87–5.11)
RDW: 14.7 % (ref 11.5–15.5)
WBC: 4.4 10*3/uL (ref 4.0–10.5)
nRBC: 0 % (ref 0.0–0.2)

## 2020-05-07 LAB — COMPREHENSIVE METABOLIC PANEL
ALT: 22 U/L (ref 0–44)
AST: 19 U/L (ref 15–41)
Albumin: 4 g/dL (ref 3.5–5.0)
Alkaline Phosphatase: 110 U/L (ref 38–126)
Anion gap: 9 (ref 5–15)
BUN: 41 mg/dL — ABNORMAL HIGH (ref 8–23)
CO2: 26 mmol/L (ref 22–32)
Calcium: 9.1 mg/dL (ref 8.9–10.3)
Chloride: 106 mmol/L (ref 98–111)
Creatinine, Ser: 1.67 mg/dL — ABNORMAL HIGH (ref 0.44–1.00)
GFR calc Af Amer: 37 mL/min — ABNORMAL LOW (ref >60)
GFR calc non Af Amer: 32 mL/min — ABNORMAL LOW (ref >60)
Glucose, Bld: 143 mg/dL — ABNORMAL HIGH (ref 70–99)
Potassium: 4.2 mmol/L (ref 3.5–5.1)
Sodium: 141 mmol/L (ref 135–145)
Total Bilirubin: 0.8 mg/dL (ref 0.3–1.2)
Total Protein: 7.1 g/dL (ref 6.5–8.1)

## 2020-05-08 ENCOUNTER — Ambulatory Visit
Admission: RE | Admit: 2020-05-08 | Discharge: 2020-05-08 | Disposition: A | Discharge status: Home / Self Care | Payer: PPO | Source: Ambulatory Visit | Attending: Radiation Oncology | Admitting: Radiation Oncology

## 2020-05-08 DIAGNOSIS — C50812 Malignant neoplasm of overlapping sites of left female breast: Secondary | ICD-10-CM | POA: Diagnosis not present

## 2020-05-08 DIAGNOSIS — Z510 Encounter for antineoplastic radiation therapy: Secondary | ICD-10-CM | POA: Diagnosis not present

## 2020-05-09 ENCOUNTER — Ambulatory Visit
Admission: RE | Admit: 2020-05-09 | Discharge: 2020-05-09 | Disposition: A | Discharge status: Home / Self Care | Payer: PPO | Source: Ambulatory Visit | Attending: Radiation Oncology | Admitting: Radiation Oncology

## 2020-05-09 DIAGNOSIS — Z510 Encounter for antineoplastic radiation therapy: Secondary | ICD-10-CM | POA: Diagnosis not present

## 2020-05-09 DIAGNOSIS — C50812 Malignant neoplasm of overlapping sites of left female breast: Secondary | ICD-10-CM | POA: Diagnosis not present

## 2020-05-10 ENCOUNTER — Ambulatory Visit: Payer: PPO

## 2020-05-12 ENCOUNTER — Ambulatory Visit
Admission: RE | Admit: 2020-05-12 | Discharge: 2020-05-12 | Disposition: A | Discharge status: Home / Self Care | Payer: PPO | Source: Ambulatory Visit | Attending: Radiation Oncology | Admitting: Radiation Oncology

## 2020-05-12 DIAGNOSIS — Z510 Encounter for antineoplastic radiation therapy: Secondary | ICD-10-CM | POA: Diagnosis not present

## 2020-05-12 DIAGNOSIS — C50812 Malignant neoplasm of overlapping sites of left female breast: Secondary | ICD-10-CM | POA: Diagnosis not present

## 2020-05-13 ENCOUNTER — Ambulatory Visit
Admission: RE | Admit: 2020-05-13 | Discharge: 2020-05-13 | Disposition: A | Discharge status: Home / Self Care | Payer: PPO | Source: Ambulatory Visit | Attending: Radiation Oncology | Admitting: Radiation Oncology

## 2020-05-13 DIAGNOSIS — Z510 Encounter for antineoplastic radiation therapy: Secondary | ICD-10-CM | POA: Diagnosis not present

## 2020-05-13 DIAGNOSIS — C50812 Malignant neoplasm of overlapping sites of left female breast: Secondary | ICD-10-CM | POA: Diagnosis not present

## 2020-05-14 ENCOUNTER — Other Ambulatory Visit: Payer: Self-pay | Admitting: *Deleted

## 2020-05-14 ENCOUNTER — Ambulatory Visit
Admission: RE | Admit: 2020-05-14 | Discharge: 2020-05-14 | Disposition: A | Discharge status: Home / Self Care | Payer: PPO | Source: Ambulatory Visit | Attending: Radiation Oncology | Admitting: Radiation Oncology

## 2020-05-14 ENCOUNTER — Encounter: Payer: Self-pay | Admitting: Oncology

## 2020-05-14 DIAGNOSIS — C50812 Malignant neoplasm of overlapping sites of left female breast: Secondary | ICD-10-CM | POA: Diagnosis not present

## 2020-05-14 DIAGNOSIS — Z510 Encounter for antineoplastic radiation therapy: Secondary | ICD-10-CM | POA: Diagnosis not present

## 2020-05-15 ENCOUNTER — Encounter: Payer: Self-pay | Admitting: Oncology

## 2020-05-15 ENCOUNTER — Ambulatory Visit
Admission: RE | Admit: 2020-05-15 | Discharge: 2020-05-15 | Disposition: A | Discharge status: Home / Self Care | Payer: PPO | Source: Ambulatory Visit | Attending: Radiation Oncology | Admitting: Radiation Oncology

## 2020-05-15 ENCOUNTER — Encounter: Payer: Self-pay | Admitting: *Deleted

## 2020-05-15 ENCOUNTER — Other Ambulatory Visit: Payer: Self-pay | Admitting: *Deleted

## 2020-05-15 DIAGNOSIS — Z510 Encounter for antineoplastic radiation therapy: Secondary | ICD-10-CM | POA: Diagnosis not present

## 2020-05-15 MED ORDER — BETAMETHASONE DIPROPIONATE 0.05 % EX CREA
TOPICAL_CREAM | Freq: Two times a day (BID) | CUTANEOUS | 0 refills | Status: DC
Start: 2020-05-15 — End: 2020-06-13

## 2020-05-16 ENCOUNTER — Encounter: Payer: Self-pay | Admitting: Oncology

## 2020-05-16 ENCOUNTER — Inpatient Hospital Stay (HOSPITAL_BASED_OUTPATIENT_CLINIC_OR_DEPARTMENT_OTHER): Payer: PPO | Admitting: Oncology

## 2020-05-16 ENCOUNTER — Ambulatory Visit
Admission: RE | Admit: 2020-05-16 | Discharge: 2020-05-16 | Disposition: A | Discharge status: Home / Self Care | Payer: PPO | Source: Ambulatory Visit | Attending: Radiation Oncology | Admitting: Radiation Oncology

## 2020-05-16 ENCOUNTER — Inpatient Hospital Stay: Payer: PPO | Admitting: Oncology

## 2020-05-16 ENCOUNTER — Other Ambulatory Visit: Payer: Self-pay

## 2020-05-16 ENCOUNTER — Ambulatory Visit: Payer: PPO

## 2020-05-16 VITALS — BP 138/87 | HR 64 | Temp 96.3°F | Wt 188.9 lb

## 2020-05-16 DIAGNOSIS — C50812 Malignant neoplasm of overlapping sites of left female breast: Secondary | ICD-10-CM | POA: Diagnosis not present

## 2020-05-16 DIAGNOSIS — Z510 Encounter for antineoplastic radiation therapy: Secondary | ICD-10-CM | POA: Diagnosis not present

## 2020-05-16 DIAGNOSIS — R21 Rash and other nonspecific skin eruption: Secondary | ICD-10-CM | POA: Diagnosis not present

## 2020-05-16 NOTE — Progress Notes (Signed)
Numbness in the left ring and pinky fingers.

## 2020-05-19 NOTE — Progress Notes (Signed)
Hematology/Oncology Consult note [facility]  [phone] [phone]  Patient Care Team: [name], MD as PCP - General (Internal Medicine) Minus Breeding, MD as PCP - Cardiology (Cardiology) Jacelyn Pi, MD as Referring Physician (Endocrinology) Lahoma Rocker, MD as Consulting Physician (Rheumatology) Rico Junker, RN as Registered Nurse Theodore Demark, RN as Registered Nurse   Name of the patient: [name]  [id number]  [birth year]   Date of visit: 05/19/20  Diagnosis- Invasive lobular carcinoma of theleftbreast pathological prognostic stage Ib pT2 pN2 acM0 ER/PR positive HER-2/neu negative s/p mastectomy and targeted node dissection  Chief complaint/ Reason for visit- acute visit for ongoing skin rash  Heme/Onc history: Patient is a 64 year old female with a past medical history significant for hypertension, CKD, diabetes, nonischemic cardiomyopathy among other medical problems. She has not undergone a mammogram for several years. Mammogram on October 2020 was done after she had a palpable area of concern in the left breast which showed an irregular mass with increased vascularity measuring 3.1 x 2.4 x 4.5 cm. The nodularity extended towards the nipple measuring at least 5.4 cm. Single abnormal lymph node in the left axilla with cortical thickness of 0.7 cm. 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  PET scan showed hypermetabolic left axillary lymph nodes which was reviewed at tumor board and they wereat least found to be 3-4. Retroareolar left breast mass 2.5 x 3.2 cm in size. No evidence of distant metastatic disease. There were small to borderline enlarged retroperitoneal lymph nodes 10 mm in size with an SUV of 4.3.Retroperitoneal lymph nodes will require  follow-up in the future  MRI showed 2 sites of biopsy-proven malignancy in the lateral left breast at posterior and anterior depth. There is non-mass enhancement spanning between the 2 sites. Morphologically abnormal level 1 axillary lymph node consistent with biopsy-proven metastatic disease.  Final pathology showed 2 separate tumors one which was 4.5 cm and the other one was 4.4 cm in size. Grade 2 ER/PR positive HER-2/neu negative with negative margins. 5 out of 11 lymph nodes were positive for malignancy. Extranodal extension present. Largest size of metastatic deposit 14 mm. mpT2 mpN2  Patient is not a candidate for anthracycline-based chemotherapy per cardiology. Adjuvant TC chemotherapy completed on 03/11/2020   Interval history- Patient had a small area of skin rash over her left hand during taxol chemo for which low potency steroid cream was prescribed. Patient states the rash has now spread to involve her b/l arms and legs. Steroid cream is not helping. These lesions are itchy. She started using po bendaryl which has been itching to some extent. No new meds/ lotions. Body products  ECOG PS- 1 Pain scale- 0  Review of systems- Review of Systems  Constitutional: Negative for chills, fever, malaise/fatigue and weight loss.  HENT: Negative for congestion, ear discharge and nosebleeds.   Eyes: Negative for blurred vision.  Respiratory: Negative for cough, hemoptysis, sputum production, shortness of breath and wheezing.   Cardiovascular: Negative for chest pain, palpitations, orthopnea and claudication.  Gastrointestinal: Negative for abdominal pain, blood in stool, constipation, diarrhea, heartburn, melena, nausea and vomiting.  Genitourinary: Negative for dysuria, flank pain, frequency, hematuria and urgency.  Musculoskeletal: Negative for back pain, joint pain and myalgias.  Skin: Positive for rash.  Neurological: Negative for dizziness, tingling, focal weakness, seizures,  weakness and headaches.  Endo/Heme/Allergies: Does not bruise/bleed easily.  Psychiatric/Behavioral: Negative for depression and suicidal ideas. The patient does not have insomnia.       Allergies  Allergen Reactions  . Hydralazine Swelling    Eye Swelling  . Other Other (See Comments)    Unknown blood pressure medication caused face swelling     Past Medical History:  Diagnosis Date  . Arthritis of both feet    Gout  . Cancer [facility])    Breast Cancer on left  . Chronic combined systolic and diastolic CHF (congestive heart failure) ([place])    a. Echo (06/2014): Severe LVH, EF 20-25%, no RWMA, Gr 3 DD, trivial MR, mild LAE, mild RVE, PASP 38 mmHg .>> b. Echo 2/16 EF 50-55% //  // c. Echo 02/2019: EF 50-55, mod LVH, Gr 1 DD, mild LAE // Echocardiogram 02/2020: EF 60-65, no RWMA, mild LVH, Gr 1 DD, normal RVSF, RVSP 30.5   . CKD (chronic kidney disease), stage III    now stage 4  . Diabetes mellitus type 2 in obese ([place])   . Diverticular disease   . Dyspnea   . Family history of breast cancer   . Family history of leukemia   . Family history of lung cancer   . Family history of testicular cancer   . Family history of throat cancer   . Fatty liver   . History of GI diverticular bleed   . Hyperlipidemia   . Hyperphosphatemia   . Hypertension   . Hypertensive heart disease    a. Renal Artery Duplex (8/15):  no RAS  . Iron deficiency anemia   . NICM (nonischemic cardiomyopathy) ([place])    a. Nuclear (06/2014): EF 32%, apical thinning, no definite scar or ischemia;  b. Echo (2/16):  Mild LVH, EF 50-55%, Gr 2 DD, no RWMA  . Obesity   . PONV (postoperative nausea and vomiting)    after D and C  . Proteinuria   . PUD (peptic ulcer disease)   . Secondary hyperparathyroidism, renal ([place])   . Tobacco abuse   . UTI (lower urinary tract infection)   . Vaginal dryness, menopausal   . Vaginal yeast infection      Past Surgical History:  Procedure Laterality Date  . COLONOSCOPY    .  DILATION AND CURETTAGE OF UTERUS    . MASTECTOMY W/ SENTINEL NODE BIOPSY Left 11/12/2019  . MASTECTOMY W/ SENTINEL NODE BIOPSY Left 11/12/2019   Procedure: LEFT MASTECTOMY WITH SENTINEL LYMPH NODE BIOPSY AND TARGETED NODE DISSECTION;  Surgeon: Jovita Kussmaul, MD;  Location: [facility];  Service: General;  Laterality: Left;  . None    . PORTA CATH INSERTION N/A 12/11/2019   Procedure: PORTA CATH INSERTION;  Surgeon: Katha Cabal, MD;  Location: [place] CV LAB;  Service: Cardiovascular;  Laterality: N/A;    Social History   Socioeconomic History  . Marital status: Widowed    Spouse name: Not on file  . Number of children: 2  . Years of education: 7  . Highest education level: Not on file  Occupational History  . Not on file  Tobacco Use  . Smoking status: Former Smoker    Quit date: 07/05/2014    Years since quitting: 5.8  . Smokeless tobacco: Never Used  Substance and Sexual Activity  . Alcohol use: No  . Drug use: No  . Sexual activity: Not on file  Other Topics Concern  . Not on file  Social History  Narrative   Currently lives in a house with her husband.    Fun: Watch TV.   Denies religious beliefs effecting health care.    Social Determinants of Health   Financial Resource Strain:   . Difficulty of Paying Living Expenses:   Food Insecurity:   . Worried About Charity fundraiser in the Last Year:   . Arboriculturist in the Last Year:   Transportation Needs:   . Film/video editor (Medical):   Marland Kitchen Lack of Transportation (Non-Medical):   Physical Activity:   . Days of Exercise per Week:   . Minutes of Exercise per Session:   Stress:   . Feeling of Stress :   Social Connections:   . Frequency of Communication with Friends and Family:   . Frequency of Social Gatherings with Friends and Family:   . Attends Religious Services:   . Active Member of Clubs or Organizations:   . Attends Archivist Meetings:   Marland Kitchen Marital Status:   Intimate Partner  Violence:   . Fear of Current or Ex-Partner:   . Emotionally Abused:   Marland Kitchen Physically Abused:   . Sexually Abused:     Family History  Problem Relation Age of Onset  . Heart attack Paternal Grandfather   . Stroke Paternal Grandmother   . Hypertension Paternal Grandmother   . Kidney disease Mother   . Diabetes Mother   . Testicular cancer Father        diagnosed older than 24  . Coronary artery disease Other   . Hypertension Other   . Breast cancer Sister        diagnosed late 26s, negative genetics  . Leukemia Brother        chronic  . Throat cancer Brother   . Pneumonia Sister 2  . Lung cancer Paternal Uncle        diagnosed over 60     Current Outpatient Medications:  .  acetaminophen (TYLENOL) 500 MG tablet, Take 1,000 mg by mouth every 6 (six) hours as needed for mild pain, moderate pain or headache. For pain, Disp: , Rfl:  .  allopurinol (ZYLOPRIM) 100 MG tablet, Take 200 mg by mouth daily., Disp: , Rfl:  .  aspirin EC 81 MG EC tablet, Take 1 tablet (81 mg total) by mouth daily., Disp: , Rfl:  .  atorvastatin (LIPITOR) 40 MG tablet, Take 1 tablet (40 mg total) by mouth daily at 6 PM., Disp: 90 tablet, Rfl: 3 .  B-D ULTRAFINE III SHORT PEN 31G X 8 MM MISC, USE AS DIRECTED *EMERGENCY FILL*, Disp: , Rfl: 0 .  betamethasone dipropionate 0.05 % cream, Apply topically 2 (two) times daily., Disp: 30 g, Rfl: 0 .  blood glucose meter kit and supplies KIT, Dispense based on patient and insurance preference. Use up to four times daily as directed. (FOR ICD-9 250.00, 250.01)., Disp: 1 each, Rfl: 0 .  calcitRIOL (ROCALTROL) 0.25 MCG capsule, Take 0.25 mcg by mouth daily., Disp: , Rfl:  .  Colchicine 0.6 MG CAPS, Take 0.6 mg by mouth every other day. , Disp: , Rfl:  .  furosemide (LASIX) 40 MG tablet, Take 1 tablet (40 mg total) by mouth daily as needed for fluid or edema., Disp: 30 tablet, Rfl:  .  gabapentin (NEURONTIN) 100 MG capsule, Take 2 capsules (200 mg total) by mouth 3 (three)  times daily., Disp: 180 capsule, Rfl: 1 .  insulin aspart (NOVOLOG FLEXPEN) 100 UNIT/ML FlexPen, Inject  5 Units into the skin 3 (three) times daily with meals. Additional insulin (5 units PLUS) CBG 70 - 120: 0 units CBG 121 - 150: 1 unit CBG 151 - 200: 2 units CBG 201 - 250: 3 units CBG 251 - 350: 4 units CBG 351 - 400: 6 units (Patient taking differently: Inject 5 Units into the skin 3 (three) times daily with meals. Sliding scale  5 units/ plus base), Disp: 15 mL, Rfl: 11 .  insulin glargine (LANTUS) 100 UNIT/ML injection, Inject 20 Units into the skin at bedtime. , Disp: , Rfl:  .  isosorbide mononitrate (IMDUR) 60 MG 24 hr tablet, Take 1 tablet (60 mg total) by mouth daily., Disp: 90 tablet, Rfl: 3 .  letrozole (FEMARA) 2.5 MG tablet, Take 1 tablet (2.5 mg total) by mouth daily., Disp: 30 tablet, Rfl: 3 .  lidocaine-prilocaine (EMLA) cream, Apply to affected area once, Disp: 30 g, Rfl: 3 .  loratadine (CLARITIN) 10 MG tablet, Take 10 mg by mouth daily as needed for allergies (around onpro neulasta with treatment)., Disp: , Rfl:  .  LORazepam (ATIVAN) 0.5 MG tablet, Take 1 tablet (0.5 mg total) by mouth every 6 (six) hours as needed (Nausea or vomiting)., Disp: 30 tablet, Rfl: 0 .  Menthol, Topical Analgesic, (ICY HOT EX), Apply 1 application topically daily as needed (pain)., Disp: , Rfl:  .  metoprolol tartrate (LOPRESSOR) 25 MG tablet, Take 0.5 tablets (12.5 mg total) by mouth 2 (two) times daily., Disp: 180 tablet, Rfl: 3 .  ondansetron (ZOFRAN) 8 MG tablet, Take 1 tablet (8 mg total) by mouth 2 (two) times daily as needed for refractory nausea / vomiting. Start on day 3 after chemo., Disp: 30 tablet, Rfl: 1 .  ONE TOUCH ULTRA TEST test strip, 1 each by Other route 4 (four) times daily. , Disp: , Rfl: 1 .  ONETOUCH DELICA LANCETS 21Y MISC, Inject 33 g into the skin 4 (four) times daily. , Disp: , Rfl: 1 .  polyethylene glycol (MIRALAX / GLYCOLAX) 17 g packet, Take 17 g by mouth daily as needed  for moderate constipation., Disp: , Rfl:  .  prochlorperazine (COMPAZINE) 10 MG tablet, Take 1 tablet (10 mg total) by mouth every 6 (six) hours as needed (Nausea or vomiting)., Disp: 30 tablet, Rfl: 1 .  triamcinolone ointment (KENALOG) 0.5 %, Apply 1 application topically 3 (three) times daily., Disp: 30 g, Rfl: 2  Physical exam:  Vitals:   05/16/20 0911  BP: 138/87  Pulse: 64  Temp: (!) 96.3 F (35.7 C)  TempSrc: Tympanic  SpO2: 97%  Weight: 188 lb 14.4 oz (85.7 kg)   Physical Exam HENT:     Head: Normocephalic and atraumatic.  Eyes:     Pupils: Pupils are equal, round, and reactive to light.  Cardiovascular:     Rate and Rhythm: Normal rate and regular rhythm.     Heart sounds: Normal heart sounds.  Pulmonary:     Effort: Pulmonary effort is normal.     Breath sounds: Normal breath sounds.  Abdominal:     General: Bowel sounds are normal.     Palpations: Abdomen is soft.  Musculoskeletal:     Cervical back: Normal range of motion.  Skin:    General: Skin is warm and dry.     Comments: Discrete erythematous macular rash circular in nature and does not appear liek a typical drug rash  Neurological:     Mental Status: She is alert and oriented to  person, place, and time.      CMP Latest Ref Rng & Units 05/07/2020  Glucose 70 - 99 mg/dL 143(H)  BUN 8 - 23 mg/dL 41(H)  Creatinine 0.44 - 1.00 mg/dL 1.67(H)  Sodium 135 - 145 mmol/L 141  Potassium 3.5 - 5.1 mmol/L 4.2  Chloride 98 - 111 mmol/L 106  CO2 22 - 32 mmol/L 26  Calcium 8.9 - 10.3 mg/dL 9.1  Total Protein 6.5 - 8.1 g/dL 7.1  Total Bilirubin 0.3 - 1.2 mg/dL 0.8  Alkaline Phos 38 - 126 U/L 110  AST 15 - 41 U/L 19  ALT 0 - 44 U/L 22   CBC Latest Ref Rng & Units 05/07/2020  WBC 4.0 - 10.5 K/uL 4.4  Hemoglobin 12.0 - 15.0 g/dL 13.6  Hematocrit 36.0 - 46.0 % 42.0  Platelets 150 - 400 K/uL 153       DG Bone Density  Result Date: 04/23/2020 EXAM: DUAL X-RAY ABSORPTIOMETRY (DXA) FOR BONE MINERAL DENSITY  IMPRESSION: Your patient Shuntel Fishburn completed a BMD test on 04/23/2020 using the [place] (software version: 14.10) manufactured by [employer]. The following summarizes the results of our evaluation. Technologist: SCE PATIENT BIOGRAPHICAL: Name: [place], Giammona Patient ID: [id number] Birth Date: 12-26-1955 Height: 58.0 in. Gender: Female Exam Date: 04/23/2020 Weight: 187.1 lbs. Indications: Caucasian, Diabetic, Height Loss, High Risk Meds, History of Breast Cancer, Kidney Disease, Postmenopausal, Previous Chemo and Radiation Fractures: Treatments: calcium w/ vit D, Claritin, Gabapentin, Insulin DENSITOMETRY RESULTS: Site      Region     Measured Date Measured Age WHO Classification Young Adult T-score BMD         %Change vs. Previous Significant Change (*) AP Spine L1-L3 04/23/2020 65.4 Normal 1.5 1.367 g/cm2 - - DualFemur Neck Right 04/23/2020 65.4 Osteopenia -1.4 0.837 g/cm2 - - ASSESSMENT: The BMD measured at Femur Neck Right is 0.837 g/cm2 with a T-score of -1.4. This patient is considered osteopenic according to [facility]) criteria. The scan quality is good. L4 was excluded due to degenerative changes. World Pharmacologist [facility]) criteria for post-menopausal, Caucasian Women: Normal:                   T-score at or above -1 SD Osteopenia/low bone mass: T-score between -1 and -2.5 SD Osteoporosis:             T-score at or below -2.5 SD RECOMMENDATIONS: 1. All patients should optimize calcium and vitamin D intake. 2. Consider FDA-approved medical therapies in postmenopausal women and men aged 15 years and older, based on the following: a. A hip or vertebral(clinical or morphometric) fracture b. T-score < -2.5 at the femoral neck or spine after appropriate evaluation to exclude secondary causes c. Low bone mass (T-score between -1.0 and -2.5 at the femoral neck or spine) and a 10-year probability of a hip fracture > 3% or a 10-year probability of a major  osteoporosis-related fracture > 20% based on the [place]-adapted WHO algorithm 3. Clinician judgment and/or patient preferences may indicate treatment for people with 10-year fracture probabilities above or below these levels FOLLOW-UP: People with diagnosed cases of osteoporosis or at high risk for fracture should have regular bone mineral density tests. For patients eligible for Medicare, routine testing is allowed once every 2 years. The testing frequency can be increased to one year for patients who have rapidly progressing disease, those who are receiving or discontinuing medical therapy to restore bone mass, or have additional  risk factors. I have reviewed this report, and agree with the above findings. [facility] Dear Dr Janese Banks, Your patient [name] completed a FRAX assessment on 04/23/2020 using the [place] (analysis version: 14.10) manufactured by [employer]. The following summarizes the results of our evaluation. PATIENT BIOGRAPHICAL: Name: Dajanay, Northrup Patient ID: [id number] Birth Date: 1956-07-21 Height:    58.0 in. Gender:     Female    Age:        63.4       Weight:    187.1 lbs. Ethnicity:  White                            Exam Date: 04/23/2020 FRAX* RESULTS:  (version: 3.5) 10-year Probability of Fracture1 Major Osteoporotic Fracture2 Hip Fracture 7.9% 0.8% Population: [place] (Caucasian) Risk Factors: None Based on Femur (Right) Neck BMD 1 -The 10-year probability of fracture may be lower than reported if the patient has received treatment. 2 -Major Osteoporotic Fracture: Clinical Spine, Forearm, Hip or Shoulder *FRAX is a Materials engineer of the [employer] for Metabolic Bone Disease, a [place] (WHO) [facility]. ASSESSMENT: The probability of a major osteoporotic fracture is 7.9% within the next ten years. The probability of a hip fracture is 0.8% within the next ten years. . Electronically Signed   By: Marin Olp  M.D.   On: 04/23/2020 14:34     Assessment and plan- Patient is a 64 y.o. female  with pathological prognostic stage Ib invasive lobular carcinoma pT2 pN2 acM0 ER/PR positive HER-2/neu negative s/pleft mastectomy and targeted node dissection.  She has completed 4 cycles of adjuvant TC chemotherapy she is currently undergoing radiation treatment and is here for acute visit for skin rash  Skin rash- does not appear like typical chemo rash or drug rash. It has been close to 2 months since she last received chemo. She has had problems with steroid induced hyperglycemia in the past during chemo. I am therefore hesitant to give her oral steroids. We discussed dermatology referral for possible biopsy versus trying higher potency steroid. She wants to try higher potency steroid and we have sent her a prescription for 0.5% betamethasone cream. I will see her in 1 week and decide about further steps   Visit Diagnosis 1. Skin rash      Dr. Randa Evens, MD, MPH [place] [facility] [phone] 05/19/2020 5:07 PM

## 2020-05-20 ENCOUNTER — Ambulatory Visit
Admission: RE | Admit: 2020-05-20 | Discharge: 2020-05-20 | Disposition: A | Discharge status: Home / Self Care | Payer: PPO | Source: Ambulatory Visit | Attending: Radiation Oncology | Admitting: Radiation Oncology

## 2020-05-20 DIAGNOSIS — I10 Essential (primary) hypertension: Secondary | ICD-10-CM | POA: Diagnosis not present

## 2020-05-20 DIAGNOSIS — N189 Chronic kidney disease, unspecified: Secondary | ICD-10-CM | POA: Diagnosis not present

## 2020-05-20 DIAGNOSIS — Z510 Encounter for antineoplastic radiation therapy: Secondary | ICD-10-CM | POA: Insufficient documentation

## 2020-05-20 DIAGNOSIS — Z9012 Acquired absence of left breast and nipple: Secondary | ICD-10-CM | POA: Insufficient documentation

## 2020-05-20 DIAGNOSIS — E1140 Type 2 diabetes mellitus with diabetic neuropathy, unspecified: Secondary | ICD-10-CM | POA: Diagnosis not present

## 2020-05-20 DIAGNOSIS — Z170 Estrogen receptor positive status [ER+]: Secondary | ICD-10-CM | POA: Diagnosis not present

## 2020-05-20 DIAGNOSIS — R21 Rash and other nonspecific skin eruption: Secondary | ICD-10-CM | POA: Diagnosis not present

## 2020-05-20 DIAGNOSIS — C50812 Malignant neoplasm of overlapping sites of left female breast: Secondary | ICD-10-CM | POA: Insufficient documentation

## 2020-05-20 DIAGNOSIS — E7800 Pure hypercholesterolemia, unspecified: Secondary | ICD-10-CM | POA: Diagnosis not present

## 2020-05-20 DIAGNOSIS — E1165 Type 2 diabetes mellitus with hyperglycemia: Secondary | ICD-10-CM | POA: Diagnosis not present

## 2020-05-21 ENCOUNTER — Inpatient Hospital Stay: Payer: PPO | Attending: Radiation Oncology

## 2020-05-21 ENCOUNTER — Ambulatory Visit: Payer: PPO

## 2020-05-21 ENCOUNTER — Other Ambulatory Visit: Payer: Self-pay

## 2020-05-21 DIAGNOSIS — Z452 Encounter for adjustment and management of vascular access device: Secondary | ICD-10-CM | POA: Diagnosis not present

## 2020-05-21 DIAGNOSIS — C773 Secondary and unspecified malignant neoplasm of axilla and upper limb lymph nodes: Secondary | ICD-10-CM | POA: Insufficient documentation

## 2020-05-21 DIAGNOSIS — C50812 Malignant neoplasm of overlapping sites of left female breast: Secondary | ICD-10-CM | POA: Insufficient documentation

## 2020-05-21 DIAGNOSIS — Z170 Estrogen receptor positive status [ER+]: Secondary | ICD-10-CM | POA: Diagnosis not present

## 2020-05-21 LAB — CBC
HCT: 41.1 % (ref 36.0–46.0)
Hemoglobin: 13.3 g/dL (ref 12.0–15.0)
MCH: 28.3 pg (ref 26.0–34.0)
MCHC: 32.4 g/dL (ref 30.0–36.0)
MCV: 87.4 fL (ref 80.0–100.0)
Platelets: 161 10*3/uL (ref 150–400)
RBC: 4.7 MIL/uL (ref 3.87–5.11)
RDW: 14.5 % (ref 11.5–15.5)
WBC: 4.9 10*3/uL (ref 4.0–10.5)
nRBC: 0 % (ref 0.0–0.2)

## 2020-05-21 NOTE — Telephone Encounter (Signed)
Can we check on her insurance forms?  I completed them and gave them to medical records 2-3 weeks ago to be submitted. Thanks, Richardson Dopp, PA-C    05/21/2020 5:52 PM

## 2020-05-22 ENCOUNTER — Ambulatory Visit: Payer: PPO

## 2020-05-23 ENCOUNTER — Ambulatory Visit: Payer: PPO

## 2020-05-23 ENCOUNTER — Other Ambulatory Visit: Payer: Self-pay | Admitting: Physician Assistant

## 2020-05-23 ENCOUNTER — Inpatient Hospital Stay: Payer: PPO | Admitting: Oncology

## 2020-05-24 ENCOUNTER — Other Ambulatory Visit: Payer: Self-pay | Admitting: Physician Assistant

## 2020-05-26 ENCOUNTER — Ambulatory Visit
Admission: RE | Admit: 2020-05-26 | Discharge: 2020-05-26 | Disposition: A | Discharge status: Home / Self Care | Payer: PPO | Source: Ambulatory Visit | Attending: Radiation Oncology | Admitting: Radiation Oncology

## 2020-05-26 DIAGNOSIS — C50812 Malignant neoplasm of overlapping sites of left female breast: Secondary | ICD-10-CM | POA: Diagnosis not present

## 2020-05-26 DIAGNOSIS — Z510 Encounter for antineoplastic radiation therapy: Secondary | ICD-10-CM | POA: Diagnosis not present

## 2020-05-26 NOTE — Telephone Encounter (Signed)
April 07, 2020 Frederik Schmidt, RN     3:01 PM Note   I spoke to the patient who earlier today 1:30 pm had an episode of a BP 74/46 HR 67 and a feeling of "sweating, felt on fire, ears ringing and dizzy."  It was approximately two hours after her morning medications.  She denies any CP or SOB and has stayed well hydrated.    She feels much better now BP 102/69 HR 67.  She will hold evening BP medications and continue to monitor BP/HR/symptoms.  She will reach out tomorrow morning 4/20 with an update.  She said that her Amlodipine was stopped recently by her Nephrologist, because of low BP.

## 2020-05-27 ENCOUNTER — Ambulatory Visit
Admission: RE | Admit: 2020-05-27 | Discharge: 2020-05-27 | Disposition: A | Discharge status: Home / Self Care | Payer: PPO | Source: Ambulatory Visit | Attending: Radiation Oncology | Admitting: Radiation Oncology

## 2020-05-27 DIAGNOSIS — Z510 Encounter for antineoplastic radiation therapy: Secondary | ICD-10-CM | POA: Diagnosis not present

## 2020-05-28 ENCOUNTER — Telehealth: Payer: Self-pay

## 2020-05-28 ENCOUNTER — Ambulatory Visit: Payer: PPO

## 2020-05-28 ENCOUNTER — Ambulatory Visit
Admission: RE | Admit: 2020-05-28 | Discharge: 2020-05-28 | Disposition: A | Discharge status: Home / Self Care | Payer: PPO | Source: Ambulatory Visit | Attending: Radiation Oncology | Admitting: Radiation Oncology

## 2020-05-28 DIAGNOSIS — Z510 Encounter for antineoplastic radiation therapy: Secondary | ICD-10-CM | POA: Diagnosis not present

## 2020-05-28 NOTE — Telephone Encounter (Signed)
I called the patient and gave her Emeline Gins recommendation to continue current medications.  She verbalized understanding.

## 2020-05-29 ENCOUNTER — Ambulatory Visit
Admission: RE | Admit: 2020-05-29 | Discharge: 2020-05-29 | Disposition: A | Discharge status: Home / Self Care | Payer: PPO | Source: Ambulatory Visit | Attending: Radiation Oncology | Admitting: Radiation Oncology

## 2020-05-29 ENCOUNTER — Ambulatory Visit: Payer: PPO

## 2020-05-29 DIAGNOSIS — Z510 Encounter for antineoplastic radiation therapy: Secondary | ICD-10-CM | POA: Diagnosis not present

## 2020-05-30 ENCOUNTER — Ambulatory Visit
Admission: RE | Admit: 2020-05-30 | Discharge: 2020-05-30 | Disposition: A | Discharge status: Home / Self Care | Payer: PPO | Source: Ambulatory Visit | Attending: Radiation Oncology | Admitting: Radiation Oncology

## 2020-05-30 DIAGNOSIS — Z510 Encounter for antineoplastic radiation therapy: Secondary | ICD-10-CM | POA: Diagnosis not present

## 2020-06-02 ENCOUNTER — Ambulatory Visit
Admission: RE | Admit: 2020-06-02 | Discharge: 2020-06-02 | Disposition: A | Discharge status: Home / Self Care | Payer: PPO | Source: Ambulatory Visit | Attending: Radiation Oncology | Admitting: Radiation Oncology

## 2020-06-02 ENCOUNTER — Inpatient Hospital Stay: Payer: PPO

## 2020-06-02 ENCOUNTER — Other Ambulatory Visit: Payer: Self-pay

## 2020-06-02 DIAGNOSIS — Z452 Encounter for adjustment and management of vascular access device: Secondary | ICD-10-CM | POA: Diagnosis not present

## 2020-06-02 DIAGNOSIS — Z510 Encounter for antineoplastic radiation therapy: Secondary | ICD-10-CM | POA: Diagnosis not present

## 2020-06-02 DIAGNOSIS — C50812 Malignant neoplasm of overlapping sites of left female breast: Secondary | ICD-10-CM | POA: Diagnosis not present

## 2020-06-02 DIAGNOSIS — Z95828 Presence of other vascular implants and grafts: Secondary | ICD-10-CM

## 2020-06-02 MED ORDER — HEPARIN SOD (PORK) LOCK FLUSH 100 UNIT/ML IV SOLN
500.0000 [IU] | Freq: Once | INTRAVENOUS | Status: AC
  Administered 2020-06-02: 500 [IU] via INTRAVENOUS
  Filled 2020-06-02: qty 5

## 2020-06-02 MED ORDER — SODIUM CHLORIDE 0.9% FLUSH
10.0000 mL | Freq: Once | INTRAVENOUS | Status: AC
  Administered 2020-06-02: 10 mL via INTRAVENOUS
  Filled 2020-06-02: qty 10

## 2020-06-03 ENCOUNTER — Ambulatory Visit: Payer: PPO

## 2020-06-03 ENCOUNTER — Ambulatory Visit
Admission: RE | Admit: 2020-06-03 | Discharge: 2020-06-03 | Disposition: A | Discharge status: Home / Self Care | Payer: PPO | Source: Ambulatory Visit | Attending: Radiation Oncology | Admitting: Radiation Oncology

## 2020-06-03 DIAGNOSIS — Z510 Encounter for antineoplastic radiation therapy: Secondary | ICD-10-CM | POA: Diagnosis not present

## 2020-06-04 ENCOUNTER — Ambulatory Visit: Payer: PPO

## 2020-06-04 DIAGNOSIS — N183 Chronic kidney disease, stage 3 unspecified: Secondary | ICD-10-CM | POA: Diagnosis not present

## 2020-06-04 DIAGNOSIS — I428 Other cardiomyopathies: Secondary | ICD-10-CM | POA: Diagnosis not present

## 2020-06-04 DIAGNOSIS — E1122 Type 2 diabetes mellitus with diabetic chronic kidney disease: Secondary | ICD-10-CM | POA: Diagnosis not present

## 2020-06-04 DIAGNOSIS — D649 Anemia, unspecified: Secondary | ICD-10-CM | POA: Diagnosis not present

## 2020-06-04 DIAGNOSIS — N2581 Secondary hyperparathyroidism of renal origin: Secondary | ICD-10-CM | POA: Diagnosis not present

## 2020-06-04 DIAGNOSIS — C50912 Malignant neoplasm of unspecified site of left female breast: Secondary | ICD-10-CM | POA: Diagnosis not present

## 2020-06-04 DIAGNOSIS — M109 Gout, unspecified: Secondary | ICD-10-CM | POA: Diagnosis not present

## 2020-06-04 DIAGNOSIS — I129 Hypertensive chronic kidney disease with stage 1 through stage 4 chronic kidney disease, or unspecified chronic kidney disease: Secondary | ICD-10-CM | POA: Diagnosis not present

## 2020-06-04 DIAGNOSIS — Z794 Long term (current) use of insulin: Secondary | ICD-10-CM | POA: Diagnosis not present

## 2020-06-05 ENCOUNTER — Telehealth: Payer: Self-pay | Admitting: *Deleted

## 2020-06-05 NOTE — Telephone Encounter (Signed)
Patient called to ask when to start Letrozole. She finished radiation on 6/15 and wanted to know when she needed to start pill. She still has rash and she can't see dermatology til middle of July and that is an add on appt. I told her to start it next week on Monday and if she has any issues or side effects from it she can call any day next week. She will let me know if she has any issues.

## 2020-06-12 ENCOUNTER — Encounter: Payer: Self-pay | Admitting: Oncology

## 2020-06-12 NOTE — Telephone Encounter (Signed)
Can you please do that

## 2020-06-13 ENCOUNTER — Other Ambulatory Visit: Payer: Self-pay

## 2020-06-13 ENCOUNTER — Telehealth: Payer: Self-pay

## 2020-06-13 MED ORDER — BETAMETHASONE DIPROPIONATE 0.05 % EX CREA: TOPICAL_CREAM | Freq: Two times a day (BID) | CUTANEOUS | 0 refills | Status: DC

## 2020-06-13 NOTE — Telephone Encounter (Signed)
Called pt back to let her know what Dr. Janese Banks suggested. Stop medication for 1 week, then restart again a week after.[facility]

## 2020-06-18 ENCOUNTER — Telehealth: Payer: Self-pay

## 2020-06-18 NOTE — Telephone Encounter (Signed)
Patient called to say that she stop the letrazole as  advised from previous messages, she wanted to add that she had facial swelling, that's now resolving and per her instructions she was to stop the letrazole for a week and restart. Patient prefers not to restart  Medication and would like another medication to be prescribed

## 2020-06-19 ENCOUNTER — Telehealth: Payer: Self-pay

## 2020-06-19 ENCOUNTER — Other Ambulatory Visit: Payer: Self-pay | Admitting: Oncology

## 2020-06-19 MED ORDER — EXEMESTANE 25 MG PO TABS
25.0000 mg | ORAL_TABLET | Freq: Every day | ORAL | 3 refills | Status: DC
Start: 2020-06-19 — End: 2020-09-15

## 2020-06-19 NOTE — Progress Notes (Signed)
romasin

## 2020-06-19 NOTE — Telephone Encounter (Signed)
We can send her a prescription for aromasin and see if she tolerates that better

## 2020-06-19 NOTE — Telephone Encounter (Signed)
Called pt to let her know Dr.Rao sent her a new medication Aromasin [facility]

## 2020-06-26 ENCOUNTER — Other Ambulatory Visit: Payer: Self-pay | Admitting: Oncology

## 2020-06-30 ENCOUNTER — Inpatient Hospital Stay: Payer: PPO

## 2020-06-30 ENCOUNTER — Other Ambulatory Visit: Payer: Self-pay

## 2020-06-30 ENCOUNTER — Encounter: Payer: Self-pay | Admitting: Oncology

## 2020-06-30 ENCOUNTER — Inpatient Hospital Stay: Payer: PPO | Attending: Oncology | Admitting: Oncology

## 2020-06-30 VITALS — BP 113/81 | HR 62 | Temp 96.9°F | Resp 18 | Wt 190.2 lb

## 2020-06-30 DIAGNOSIS — R21 Rash and other nonspecific skin eruption: Secondary | ICD-10-CM | POA: Diagnosis not present

## 2020-06-30 DIAGNOSIS — Z8043 Family history of malignant neoplasm of testis: Secondary | ICD-10-CM | POA: Diagnosis not present

## 2020-06-30 DIAGNOSIS — Z9012 Acquired absence of left breast and nipple: Secondary | ICD-10-CM | POA: Insufficient documentation

## 2020-06-30 DIAGNOSIS — M8580 Other specified disorders of bone density and structure, unspecified site: Secondary | ICD-10-CM | POA: Diagnosis not present

## 2020-06-30 DIAGNOSIS — N184 Chronic kidney disease, stage 4 (severe): Secondary | ICD-10-CM | POA: Diagnosis not present

## 2020-06-30 DIAGNOSIS — Z8349 Family history of other endocrine, nutritional and metabolic diseases: Secondary | ICD-10-CM | POA: Diagnosis not present

## 2020-06-30 DIAGNOSIS — C50812 Malignant neoplasm of overlapping sites of left female breast: Secondary | ICD-10-CM

## 2020-06-30 DIAGNOSIS — Z79899 Other long term (current) drug therapy: Secondary | ICD-10-CM | POA: Insufficient documentation

## 2020-06-30 DIAGNOSIS — Z923 Personal history of irradiation: Secondary | ICD-10-CM | POA: Diagnosis not present

## 2020-06-30 DIAGNOSIS — Z79811 Long term (current) use of aromatase inhibitors: Secondary | ICD-10-CM | POA: Diagnosis not present

## 2020-06-30 DIAGNOSIS — Z801 Family history of malignant neoplasm of trachea, bronchus and lung: Secondary | ICD-10-CM | POA: Diagnosis not present

## 2020-06-30 DIAGNOSIS — I5042 Chronic combined systolic (congestive) and diastolic (congestive) heart failure: Secondary | ICD-10-CM | POA: Diagnosis not present

## 2020-06-30 DIAGNOSIS — Z7983 Long term (current) use of bisphosphonates: Secondary | ICD-10-CM

## 2020-06-30 DIAGNOSIS — Z8249 Family history of ischemic heart disease and other diseases of the circulatory system: Secondary | ICD-10-CM | POA: Diagnosis not present

## 2020-06-30 DIAGNOSIS — Z7982 Long term (current) use of aspirin: Secondary | ICD-10-CM | POA: Diagnosis not present

## 2020-06-30 DIAGNOSIS — I110 Hypertensive heart disease with heart failure: Secondary | ICD-10-CM | POA: Diagnosis not present

## 2020-06-30 DIAGNOSIS — Z803 Family history of malignant neoplasm of breast: Secondary | ICD-10-CM | POA: Insufficient documentation

## 2020-06-30 DIAGNOSIS — Z794 Long term (current) use of insulin: Secondary | ICD-10-CM | POA: Insufficient documentation

## 2020-06-30 DIAGNOSIS — Z87891 Personal history of nicotine dependence: Secondary | ICD-10-CM | POA: Insufficient documentation

## 2020-06-30 DIAGNOSIS — Z806 Family history of leukemia: Secondary | ICD-10-CM | POA: Insufficient documentation

## 2020-06-30 DIAGNOSIS — E119 Type 2 diabetes mellitus without complications: Secondary | ICD-10-CM | POA: Insufficient documentation

## 2020-06-30 DIAGNOSIS — Z170 Estrogen receptor positive status [ER+]: Secondary | ICD-10-CM

## 2020-06-30 LAB — COMPREHENSIVE METABOLIC PANEL
ALT: 33 U/L (ref 0–44)
AST: 25 U/L (ref 15–41)
Albumin: 3.9 g/dL (ref 3.5–5.0)
Alkaline Phosphatase: 106 U/L (ref 38–126)
Anion gap: 10 (ref 5–15)
BUN: 55 mg/dL — ABNORMAL HIGH (ref 8–23)
CO2: 28 mmol/L (ref 22–32)
Calcium: 9 mg/dL (ref 8.9–10.3)
Chloride: 101 mmol/L (ref 98–111)
Creatinine, Ser: 2.17 mg/dL — ABNORMAL HIGH (ref 0.44–1.00)
GFR calc Af Amer: 27 mL/min — ABNORMAL LOW (ref >60)
GFR calc non Af Amer: 23 mL/min — ABNORMAL LOW (ref >60)
Glucose, Bld: 148 mg/dL — ABNORMAL HIGH (ref 70–99)
Potassium: 4.1 mmol/L (ref 3.5–5.1)
Sodium: 139 mmol/L (ref 135–145)
Total Bilirubin: 0.7 mg/dL (ref 0.3–1.2)
Total Protein: 6.9 g/dL (ref 6.5–8.1)

## 2020-06-30 MED ORDER — HEPARIN SOD (PORK) LOCK FLUSH 100 UNIT/ML IV SOLN
500.0000 [IU] | Freq: Once | INTRAVENOUS | Status: AC | PRN
  Administered 2020-06-30: 500 [IU]
  Filled 2020-06-30: qty 5

## 2020-06-30 MED ORDER — ZOLEDRONIC ACID 4 MG/5ML IV CONC
3.0000 mg | INTRAVENOUS | Status: DC
  Administered 2020-06-30: 3 mg via INTRAVENOUS
  Filled 2020-06-30: qty 3.75

## 2020-06-30 MED ORDER — SODIUM CHLORIDE 0.9 % IV SOLN
Freq: Once | INTRAVENOUS | Status: AC
  Filled 2020-06-30: qty 250

## 2020-06-30 NOTE — Progress Notes (Signed)
Per Dr. Janese Banks CMP has been reviewed including Creatinine and GFR and okay to proceed with 3mg  Zometa at this time.   1240: Pt tolerated infusion well. No s/s of distress or reaction noted. Pt and VS stable at discharge.

## 2020-06-30 NOTE — Progress Notes (Signed)
Pt in for follow up and infusion today.  Reports changed from letrozole to aromasin and doing much better.

## 2020-06-30 NOTE — Progress Notes (Signed)
Hematology/Oncology Consult note [facility]  [phone] [phone]  Patient Care Team: [name], MD as PCP - General (Internal Medicine) Minus Breeding, MD as PCP - Cardiology (Cardiology) Jacelyn Pi, MD as Referring Physician (Endocrinology) Lahoma Rocker, MD as Consulting Physician (Rheumatology) Rico Junker, RN as Registered Nurse Theodore Demark, RN as Registered Nurse   Name of the patient: [name]  [id number]  Apr 14, 1956   Date of visit: 06/30/20  Diagnosis- Invasive lobular carcinoma of theleftbreast pathological prognostic stage Ib pT2 pN2 acM0 ER/PR positive HER-2/neu negative s/p mastectomy and targeted node dissection  Chief complaint/ Reason for visit-routine follow-up of breast cancer  Heme/Onc history: Patient is a 64 year old female with a past medical history significant for hypertension, CKD, diabetes, nonischemic cardiomyopathy among other medical problems. She has not undergone a mammogram for several years. Mammogram on October 2020 was done after she had a palpable area of concern in the left breast which showed an irregular mass with increased vascularity measuring 3.1 x 2.4 x 4.5 cm. The nodularity extended towards the nipple measuring at least 5.4 cm. Single abnormal lymph node in the left axilla with cortical thickness of 0.7 cm. 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  PET scan showed hypermetabolic left axillary lymph nodes which was reviewed at tumor board and they wereat least found to be 3-4. Retroareolar left breast mass 2.5 x 3.2 cm in size. No evidence of distant metastatic disease. There were small to borderline enlarged retroperitoneal lymph nodes 10 mm in size with an SUV of 4.3.Retroperitoneal lymph nodes will require  follow-up in the future  MRI showed 2 sites of biopsy-proven malignancy in the lateral left breast at posterior and anterior depth. There is non-mass enhancement spanning between the 2 sites. Morphologically abnormal level 1 axillary lymph node consistent with biopsy-proven metastatic disease.  Final pathology showed 2 separate tumors one which was 4.5 cm and the other one was 4.4 cm in size. Grade 2 ER/PR positive HER-2/neu negative with negative margins. 5 out of 11 lymph nodes were positive for malignancy. Extranodal extension present. Largest size of metastatic deposit 14 mm. mpT2 mpN2  Patient is not a candidate for anthracycline-based chemotherapy per cardiology. Adjuvant TC chemotherapycompleted on 03/11/2020.  She completed adjuvant radiation therapy as well and started Arimidex which she could not tolerate and was to Aromasin on 06/19/2020   Interval history-patient continues to have skin rash which is now spreading to her bilateral arms as well as back.  She is seeing a dermatologist next week.  She is currently on topical steroids.  Reports tolerating Aromasin much better and does not have any significant side effects  ECOG PS- 1 Pain scale- 0   Review of systems- Review of Systems  Constitutional: Positive for malaise/fatigue. Negative for chills, fever and weight loss.  HENT: Negative for congestion, ear discharge and nosebleeds.   Eyes: Negative for blurred vision.  Respiratory: Negative for cough, hemoptysis, sputum production, shortness of breath and wheezing.   Cardiovascular: Negative for chest pain, palpitations, orthopnea and claudication.  Gastrointestinal: Negative for abdominal pain, blood in stool, constipation, diarrhea, heartburn, melena, nausea and vomiting.  Genitourinary: Negative for dysuria, flank pain, frequency, hematuria and urgency.  Musculoskeletal: Negative for back pain, joint pain and myalgias.  Skin: Positive for rash.  Neurological: Negative  for dizziness,  tingling, focal weakness, seizures, weakness and headaches.  Endo/Heme/Allergies: Does not bruise/bleed easily.  Psychiatric/Behavioral: Negative for depression and suicidal ideas. The patient does not have insomnia.       Allergies  Allergen Reactions  . Hydralazine Swelling    Eye Swelling  . Other Other (See Comments)    Unknown blood pressure medication caused face swelling     Past Medical History:  Diagnosis Date  . Arthritis of both feet    Gout  . Cancer [facility])    Breast Cancer on left  . Chronic combined systolic and diastolic CHF (congestive heart failure) ([place])    a. Echo (06/2014): Severe LVH, EF 20-25%, no RWMA, Gr 3 DD, trivial MR, mild LAE, mild RVE, PASP 38 mmHg .>> b. Echo 2/16 EF 50-55% //  // c. Echo 02/2019: EF 50-55, mod LVH, Gr 1 DD, mild LAE // Echocardiogram 02/2020: EF 60-65, no RWMA, mild LVH, Gr 1 DD, normal RVSF, RVSP 30.5   . CKD (chronic kidney disease), stage III    now stage 4  . Diabetes mellitus type 2 in obese ([place])   . Diverticular disease   . Dyspnea   . Family history of breast cancer   . Family history of leukemia   . Family history of lung cancer   . Family history of testicular cancer   . Family history of throat cancer   . Fatty liver   . History of GI diverticular bleed   . Hyperlipidemia   . Hyperphosphatemia   . Hypertension   . Hypertensive heart disease    a. Renal Artery Duplex (8/15):  no RAS  . Iron deficiency anemia   . NICM (nonischemic cardiomyopathy) ([place])    a. Nuclear (06/2014): EF 32%, apical thinning, no definite scar or ischemia;  b. Echo (2/16):  Mild LVH, EF 50-55%, Gr 2 DD, no RWMA  . Obesity   . PONV (postoperative nausea and vomiting)    after D and C  . Proteinuria   . PUD (peptic ulcer disease)   . Secondary hyperparathyroidism, renal ([place])   . Tobacco abuse   . UTI (lower urinary tract infection)   . Vaginal dryness, menopausal   . Vaginal yeast infection      Past Surgical History:    Procedure Laterality Date  . COLONOSCOPY    . DILATION AND CURETTAGE OF UTERUS    . MASTECTOMY W/ SENTINEL NODE BIOPSY Left 11/12/2019  . MASTECTOMY W/ SENTINEL NODE BIOPSY Left 11/12/2019   Procedure: LEFT MASTECTOMY WITH SENTINEL LYMPH NODE BIOPSY AND TARGETED NODE DISSECTION;  Surgeon: Jovita Kussmaul, MD;  Location: [place];  Service: General;  Laterality: Left;  . None    . PORTA CATH INSERTION N/A 12/11/2019   Procedure: PORTA CATH INSERTION;  Surgeon: Katha Cabal, MD;  Location: [place] [facility];  Service: Cardiovascular;  Laterality: N/A;    Social History   Socioeconomic History  . Marital status: Widowed    Spouse name: Not on file  . Number of children: 2  . Years of education: 7  . Highest education level: Not on file  Occupational History  . Not on file  Tobacco Use  . Smoking status: Former Smoker    Quit date: 07/05/2014    Years since quitting: 5.9  . Smokeless tobacco: Never Used  Vaping Use  . Vaping Use: Never used  Substance and Sexual Activity  . Alcohol use: No  . Drug use: No  . Sexual activity: Not  on file  Other Topics Concern  . Not on file  Social History Narrative   Currently lives in a house with her husband.    Fun: Watch TV.   Denies religious beliefs effecting health care.    Social Determinants of Health   Financial Resource Strain:   . Difficulty of Paying Living Expenses:   Food Insecurity:   . Worried About Charity fundraiser in the Last Year:   . Arboriculturist in the Last Year:   Transportation Needs:   . Film/video editor (Medical):   Marland Kitchen Lack of Transportation (Non-Medical):   Physical Activity:   . Days of Exercise per Week:   . Minutes of Exercise per Session:   Stress:   . Feeling of Stress :   Social Connections:   . Frequency of Communication with Friends and Family:   . Frequency of Social Gatherings with Friends and Family:   . Attends Religious Services:   . Active Member of Clubs or Organizations:    . Attends Archivist Meetings:   Marland Kitchen Marital Status:   Intimate Partner Violence:   . Fear of Current or Ex-Partner:   . Emotionally Abused:   Marland Kitchen Physically Abused:   . Sexually Abused:     Family History  Problem Relation Age of Onset  . Heart attack Paternal Grandfather   . Stroke Paternal Grandmother   . Hypertension Paternal Grandmother   . Kidney disease Mother   . Diabetes Mother   . Testicular cancer Father        diagnosed older than 8  . Coronary artery disease Other   . Hypertension Other   . Breast cancer Sister        diagnosed late 20s, negative genetics  . Leukemia Brother        chronic  . Throat cancer Brother   . Pneumonia Sister 2  . Lung cancer Paternal Uncle        diagnosed over 61     Current Outpatient Medications:  .  acetaminophen (TYLENOL) 500 MG tablet, Take 1,000 mg by mouth every 6 (six) hours as needed for mild pain, moderate pain or headache. For pain, Disp: , Rfl:  .  allopurinol (ZYLOPRIM) 100 MG tablet, Take 200 mg by mouth daily., Disp: , Rfl:  .  aspirin EC 81 MG EC tablet, Take 1 tablet (81 mg total) by mouth daily., Disp: , Rfl:  .  atorvastatin (LIPITOR) 40 MG tablet, Take 1 tablet (40 mg total) by mouth daily at 6 PM., Disp: 90 tablet, Rfl: 3 .  B-D ULTRAFINE III SHORT PEN 31G X 8 MM MISC, USE AS DIRECTED *EMERGENCY FILL*, Disp: , Rfl: 0 .  betamethasone dipropionate 0.05 % cream, Apply topically 2 (two) times daily., Disp: 30 g, Rfl: 0 .  blood glucose meter kit and supplies KIT, Dispense based on patient and insurance preference. Use up to four times daily as directed. (FOR ICD-9 250.00, 250.01)., Disp: 1 each, Rfl: 0 .  calcitRIOL (ROCALTROL) 0.25 MCG capsule, Take 0.25 mcg by mouth daily., Disp: , Rfl:  .  Colchicine 0.6 MG CAPS, Take 0.6 mg by mouth every other day. , Disp: , Rfl:  .  exemestane (AROMASIN) 25 MG tablet, Take 1 tablet (25 mg total) by mouth daily after breakfast., Disp: 30 tablet, Rfl: 3 .  furosemide  (LASIX) 40 MG tablet, Take 1 tablet (40 mg total) by mouth daily as needed for fluid or edema., Disp: 30  tablet, Rfl:  .  gabapentin (NEURONTIN) 100 MG capsule, Take 2 capsules (200 mg total) by mouth 3 (three) times daily., Disp: 180 capsule, Rfl: 1 .  insulin aspart (NOVOLOG FLEXPEN) 100 UNIT/ML FlexPen, Inject 5 Units into the skin 3 (three) times daily with meals. Additional insulin (5 units PLUS) CBG 70 - 120: 0 units CBG 121 - 150: 1 unit CBG 151 - 200: 2 units CBG 201 - 250: 3 units CBG 251 - 350: 4 units CBG 351 - 400: 6 units (Patient taking differently: Inject 5 Units into the skin 3 (three) times daily with meals. Sliding scale  5 units/ plus base), Disp: 15 mL, Rfl: 11 .  insulin glargine (LANTUS) 100 UNIT/ML injection, Inject 20 Units into the skin at bedtime. , Disp: , Rfl:  .  isosorbide mononitrate (IMDUR) 60 MG 24 hr tablet, Take 1 tablet (60 mg total) by mouth daily., Disp: 90 tablet, Rfl: 3 .  lidocaine-prilocaine (EMLA) cream, Apply to affected area once, Disp: 30 g, Rfl: 3 .  loratadine (CLARITIN) 10 MG tablet, Take 10 mg by mouth daily as needed for allergies (around onpro neulasta with treatment)., Disp: , Rfl:  .  LORazepam (ATIVAN) 0.5 MG tablet, Take 1 tablet (0.5 mg total) by mouth every 6 (six) hours as needed (Nausea or vomiting)., Disp: 30 tablet, Rfl: 0 .  Menthol, Topical Analgesic, (ICY HOT EX), Apply 1 application topically daily as needed (pain)., Disp: , Rfl:  .  metoprolol tartrate (LOPRESSOR) 25 MG tablet, TAKE 1 TABLET BY MOUTH TWICE A DAY, Disp: 180 tablet, Rfl: 3 .  ondansetron (ZOFRAN) 8 MG tablet, Take 1 tablet (8 mg total) by mouth 2 (two) times daily as needed for refractory nausea / vomiting. Start on day 3 after chemo., Disp: 30 tablet, Rfl: 1 .  ONE TOUCH ULTRA TEST test strip, 1 each by Other route 4 (four) times daily. , Disp: , Rfl: 1 .  ONETOUCH DELICA LANCETS 14H MISC, Inject 33 g into the skin 4 (four) times daily. , Disp: , Rfl: 1 .  polyethylene  glycol (MIRALAX / GLYCOLAX) 17 g packet, Take 17 g by mouth daily as needed for moderate constipation., Disp: , Rfl:  .  prochlorperazine (COMPAZINE) 10 MG tablet, Take 1 tablet (10 mg total) by mouth every 6 (six) hours as needed (Nausea or vomiting)., Disp: 30 tablet, Rfl: 1 .  telmisartan (MICARDIS) 20 MG tablet, TAKE 1 TABLET BY MOUTH EVERY DAY, Disp: 90 tablet, Rfl: 1 .  triamcinolone ointment (KENALOG) 0.5 %, Apply 1 application topically 3 (three) times daily., Disp: 30 g, Rfl: 2 No current facility-administered medications for this visit.  Facility-Administered Medications Ordered in Other Visits:  .  zolendronic acid (ZOMETA) 3 mg in sodium chloride 0.9 % 100 mL IVPB, 3 mg, Intravenous, Q6 months, [name], MD, Stopped at 06/30/20 1235  Physical exam:  Vitals:   06/30/20 1051  BP: 113/81  Pulse: 62  Resp: 18  Temp: (!) 96.9 F (36.1 C)  TempSrc: Tympanic  Weight: 190 lb 3.2 oz (86.3 kg)   Physical Exam Constitutional:      General: She is not in acute distress. Cardiovascular:     Rate and Rhythm: Normal rate and regular rhythm.     Heart sounds: Normal heart sounds.  Pulmonary:     Effort: Pulmonary effort is normal.     Breath sounds: Normal breath sounds.  Abdominal:     General: Bowel sounds are normal.  Palpations: Abdomen is soft.  Skin:    General: Skin is warm and dry.     Comments: Silvery plaque-like lesions seen over bilateral upper extremities as well as upper back concerning for psoriatic etiology  Neurological:     Mental Status: She is alert and oriented to person, place, and time.   Patient is s/p left mastectomy without reconstruction.  No evidence of chest wall recurrence.  No palpable masses in the right breast.  No palpable right axillary adenopathy.  CMP Latest Ref Rng & Units 06/30/2020  Glucose 70 - 99 mg/dL 148(H)  BUN 8 - 23 mg/dL 55(H)  Creatinine 0.44 - 1.00 mg/dL 2.17(H)  Sodium 135 - 145 mmol/L 139  Potassium 3.5 - 5.1 mmol/L  4.1  Chloride 98 - 111 mmol/L 101  CO2 22 - 32 mmol/L 28  Calcium 8.9 - 10.3 mg/dL 9.0  Total Protein 6.5 - 8.1 g/dL 6.9  Total Bilirubin 0.3 - 1.2 mg/dL 0.7  Alkaline Phos 38 - 126 U/L 106  AST 15 - 41 U/L 25  ALT 0 - 44 U/L 33   CBC Latest Ref Rng & Units 05/21/2020  WBC 4.0 - 10.5 K/uL 4.9  Hemoglobin 12.0 - 15.0 g/dL 13.3  Hematocrit 36 - 46 % 41.1  Platelets 150 - 400 K/uL 161      Assessment and plan- Patient is a 64 y.o. female with pathological prognostic stage Ib invasive lobular carcinoma pT2 pN2 acM0 ER/PR positive HER-2/neu negative s/pleft mastectomy and targeted node dissection.She has completed 4 cycles of adjuvant TC chemotherapy And adjuvant radiation therapy.  She is currently on Aromasin and this is a routine follow-up visit  1.  Breast cancer: Clinically doing well post chemotherapy and radiation treatment.  She will continue Aromasin  2.  Given ER positive disease and pre-existing osteopenia she would benefit from adjuvant bisphosphonates every 6 months for up to 3 to 5 years per ASCO guidelines.  She did obtain dental clearance and given her low GFR and proceeding with Zometa 3 mg today.  3.  Skin rash: Etiology unclear.  Her rash today appears more like psoriasis with silvery plaques.  She is currently on topical steroids but will see dermatology next week  I will see her back in 3 months   Visit Diagnosis 1. Malignant neoplasm of overlapping sites of left breast in female, estrogen receptor positive ([place])   2. Use of exemestane (Aromasin)   3. Skin rash   4. Long term (current) use of bisphosphonates      Dr. Randa Evens, MD, MPH [facility] [phone] 06/30/2020 4:20 PM

## 2020-07-01 ENCOUNTER — Other Ambulatory Visit: Payer: Self-pay | Admitting: Oncology

## 2020-07-09 ENCOUNTER — Ambulatory Visit
Admission: RE | Admit: 2020-07-09 | Discharge: 2020-07-09 | Disposition: A | Discharge status: Home / Self Care | Payer: PPO | Source: Ambulatory Visit | Attending: Radiation Oncology | Admitting: Radiation Oncology

## 2020-07-09 ENCOUNTER — Other Ambulatory Visit: Payer: Self-pay

## 2020-07-09 VITALS — Wt 189.6 lb

## 2020-07-09 DIAGNOSIS — Z170 Estrogen receptor positive status [ER+]: Secondary | ICD-10-CM

## 2020-07-09 DIAGNOSIS — C50812 Malignant neoplasm of overlapping sites of left female breast: Secondary | ICD-10-CM

## 2020-07-09 NOTE — Progress Notes (Signed)
Radiation Oncology Follow up Note  Name: [name]   Date:   07/09/2020 MRN:  [id number] DOB: 1956/09/28    This 64 y.o. female presents to the clinic today for 1 month follow-up status post radiation therapy to her left chest wall.  Inpatient stage Ib (pT2 N2 M0) ER/PR positive HER-2 negative invasive lobular carcinoma status post modified radical mastectomy and targeted lymph node dissection.  REFERRING PROVIDER: [name], MD  HPI: Patient is a 64 year old female now out 1 month having completed left chest wall peripheral lymphatic radiation therapy for stage Ib ER/PR positive invasive mammary carcinoma status post neoadjuvant TC chemotherapy seen today in routine follow-up she is doing well.  She specifically denies any chest wall pain tenderness or nodularity.  She is having no swelling in her left upper extremity.  She has been started on.  Aromasin is tolerating that well without side effects.  COMPLICATIONS OF TREATMENT: none  FOLLOW UP COMPLIANCE: keeps appointments   PHYSICAL EXAM:  Wt 189 lb 9.5 oz (86 kg)   BMI 37.03 kg/m  She status post left modified radical mastectomy incision is well-healed no evidence of mass or nodularity mastectomy scar right breast is free of dominant mass or nodularity no axillary or supraclavicular adenopathy is identified.  No evidence of lymphedema of her left upper extremity.  Well-developed well-nourished patient in NAD. HEENT reveals PERLA, EOMI, discs not visualized.  Oral cavity is clear. No oral mucosal lesions are identified. Neck is clear without evidence of cervical or supraclavicular adenopathy. Lungs are clear to A&P. Cardiac examination is essentially unremarkable with regular rate and rhythm without murmur rub or thrill. Abdomen is benign with no organomegaly or masses noted. Motor sensory and DTR levels are equal and symmetric in the upper and lower extremities. Cranial nerves II through XII are grossly intact. Proprioception is intact.  No peripheral adenopathy or edema is identified. No motor or sensory levels are noted. Crude visual fields are within normal range.  RADIOLOGY RESULTS: No current films to review  PLAN: Present time patient is doing well 1 month out from external beam radiation therapy and pleased with her overall progress.  She continues on Aromasin without side effect.  I have asked to see her back in 4 to 5 months for follow-up.  Patient knows to call with any concerns.  I would like to take this opportunity to thank you for allowing me to participate in the care of your patient.Noreene Filbert, MD

## 2020-07-10 ENCOUNTER — Ambulatory Visit: Payer: PPO | Admitting: Dermatology

## 2020-07-10 DIAGNOSIS — L309 Dermatitis, unspecified: Secondary | ICD-10-CM

## 2020-07-10 DIAGNOSIS — D485 Neoplasm of uncertain behavior of skin: Secondary | ICD-10-CM | POA: Diagnosis not present

## 2020-07-10 DIAGNOSIS — L409 Psoriasis, unspecified: Secondary | ICD-10-CM | POA: Diagnosis not present

## 2020-07-14 ENCOUNTER — Telehealth: Payer: Self-pay | Admitting: Dermatology

## 2020-07-14 ENCOUNTER — Telehealth: Payer: Self-pay

## 2020-07-14 DIAGNOSIS — L409 Psoriasis, unspecified: Secondary | ICD-10-CM

## 2020-07-14 MED ORDER — BETAMETHASONE DIPROPIONATE AUG 0.05 % EX CREA: TOPICAL_CREAM | Freq: Two times a day (BID) | CUTANEOUS | 2 refills | Status: DC

## 2020-07-14 NOTE — Telephone Encounter (Signed)
Patient left message on office voice mail saying that she was calling for pathology results from last visit with Lavonna Monarch, MD.

## 2020-07-14 NOTE — Telephone Encounter (Signed)
-----   Message from Lavonna Monarch, MD sent at 07/11/2020 10:29 PM EDT ----- Please inform [name] that her biopsy suggests that this is adult onset psoriasis.  This is an autoimmune disease as we discussed; it can be treated but not cured.  She should initially use augmented betamethasone debris and 8 on her wet skin immediately after bathing once daily for 30 days.  There should not be used on the face or body folds.  Schedule a follow-up in 4 to 6 weeks.

## 2020-07-14 NOTE — Telephone Encounter (Signed)
Attempted to contact patient she answered but was unable to hear me.  Will try again later.

## 2020-07-14 NOTE — Telephone Encounter (Signed)
Patient aware of results and recommendations.  Appointment made and medication sent to pharmacy.

## 2020-07-14 NOTE — Telephone Encounter (Signed)
Patient calling for biopsy resutls

## 2020-07-15 NOTE — Telephone Encounter (Signed)
Patient informed of results.  

## 2020-07-23 ENCOUNTER — Telehealth: Payer: Self-pay

## 2020-07-23 DIAGNOSIS — C50812 Malignant neoplasm of overlapping sites of left female breast: Secondary | ICD-10-CM

## 2020-07-23 DIAGNOSIS — Z170 Estrogen receptor positive status [ER+]: Secondary | ICD-10-CM

## 2020-07-23 NOTE — Telephone Encounter (Signed)
Survivorship Care Plan visit completed.  Treatment summary reviewed and mailed to patient.  ASCO answers booklet reviewed and mailed to patient.  CARE program and Cancer Transitions discussed with patient along with other resources cancer center offers to patients and caregivers.  Patient verbalized understanding.  SCP packet mailed.  Patient declined visit with APP to have a Virtual visit to introduce them to the Survivorship Clinic. Patient states will notify us if she feels she needs to have visit with APP.   Encouraged patient to call for any questions or concerns.

## 2020-07-27 ENCOUNTER — Other Ambulatory Visit: Payer: Self-pay | Admitting: Physician Assistant

## 2020-07-28 ENCOUNTER — Other Ambulatory Visit: Payer: Self-pay | Admitting: Physician Assistant

## 2020-08-11 ENCOUNTER — Other Ambulatory Visit: Payer: Self-pay

## 2020-08-11 ENCOUNTER — Inpatient Hospital Stay: Payer: PPO | Attending: Oncology

## 2020-08-11 DIAGNOSIS — Z452 Encounter for adjustment and management of vascular access device: Secondary | ICD-10-CM | POA: Diagnosis not present

## 2020-08-11 DIAGNOSIS — Z170 Estrogen receptor positive status [ER+]: Secondary | ICD-10-CM | POA: Diagnosis not present

## 2020-08-11 DIAGNOSIS — Z95828 Presence of other vascular implants and grafts: Secondary | ICD-10-CM

## 2020-08-11 DIAGNOSIS — Z79811 Long term (current) use of aromatase inhibitors: Secondary | ICD-10-CM | POA: Diagnosis not present

## 2020-08-11 DIAGNOSIS — C50812 Malignant neoplasm of overlapping sites of left female breast: Secondary | ICD-10-CM | POA: Insufficient documentation

## 2020-08-11 MED ORDER — HEPARIN SOD (PORK) LOCK FLUSH 100 UNIT/ML IV SOLN
INTRAVENOUS | Status: AC
  Filled 2020-08-11: qty 5

## 2020-08-11 MED ORDER — SODIUM CHLORIDE 0.9% FLUSH
10.0000 mL | Freq: Once | INTRAVENOUS | Status: AC
  Administered 2020-08-11: 10 mL via INTRAVENOUS
  Filled 2020-08-11: qty 10

## 2020-08-11 MED ORDER — HEPARIN SOD (PORK) LOCK FLUSH 100 UNIT/ML IV SOLN
500.0000 [IU] | Freq: Once | INTRAVENOUS | Status: AC
  Administered 2020-08-11: 500 [IU] via INTRAVENOUS
  Filled 2020-08-11: qty 5

## 2020-08-18 ENCOUNTER — Encounter: Payer: Self-pay | Admitting: Dermatology

## 2020-08-18 NOTE — Progress Notes (Signed)
   New Patient   Subjective  [name] is a 64 y.o. female who presents for the following: Rash (entire body, started in december, has tried betamethasone not much help, triamcinolone has helped some, itches, worse after shower). Rash Location: All over Duration: 8 months Quality: Spreading and itching Associated Signs/Symptoms: Modifying Factors: Topical triamcinolone of no benefit Severity:  Timing: Context:    The following portions of the chart were reviewed this encounter and updated as appropriate: Tobacco  Allergies  Meds  Problems  Med Hx  Surg Hx  Fam Hx      Objective  Well appearing patient in no apparent distress; mood and affect are within normal limits.  A focused examination was performed including Head, neck, upper chest, back, arms, legs, joints, lymph nodes.. Relevant physical exam findings are noted in the Assessment and Plan.   Assessment & Plan  Neoplasm of uncertain behavior of skin (2) Mid/Low Back  Skin / nail biopsy Type of biopsy: tangential   Informed consent: discussed and consent obtained   Timeout: patient name, date of birth, surgical site, and procedure verified   Procedure prep:  Patient was prepped and draped in usual sterile fashion Prep type:  Chlorhexidine Anesthesia: the lesion was anesthetized in a standard fashion   Anesthetic:  1% lidocaine w/ epinephrine 1-100,000 local infiltration Instrument used: flexible razor blade   Hemostasis achieved with: ferric subsulfate   Outcome: patient tolerated procedure well   Post-procedure details: wound care instructions given    Specimen 1 - Surgical pathology Differential Diagnosis: psoriasis vs auto immune Check Margins: No Michels Solution  mid/Lower Back  Skin / nail biopsy Type of biopsy: tangential   Informed consent: discussed and consent obtained   Timeout: patient name, date of birth, surgical site, and procedure verified   Procedure prep:  Patient was prepped and  draped in usual sterile fashion Prep type:  Chlorhexidine Anesthesia: the lesion was anesthetized in a standard fashion   Anesthetic:  1% lidocaine w/ epinephrine 1-100,000 local infiltration Instrument used: flexible razor blade   Hemostasis achieved with: ferric subsulfate   Outcome: patient tolerated procedure well   Post-procedure details: wound care instructions given    Specimen 2 - Surgical pathology Differential Diagnosis:psoriasis vs auto immune Check Margins: No  Dermatitis Mid Back  Biopsies to exclude CTCL.  If this is negative, will add a more potent topical corticoid.

## 2020-09-03 DIAGNOSIS — D649 Anemia, unspecified: Secondary | ICD-10-CM | POA: Diagnosis not present

## 2020-09-03 DIAGNOSIS — N2581 Secondary hyperparathyroidism of renal origin: Secondary | ICD-10-CM | POA: Diagnosis not present

## 2020-09-03 DIAGNOSIS — Z794 Long term (current) use of insulin: Secondary | ICD-10-CM | POA: Diagnosis not present

## 2020-09-03 DIAGNOSIS — M109 Gout, unspecified: Secondary | ICD-10-CM | POA: Diagnosis not present

## 2020-09-03 DIAGNOSIS — E1122 Type 2 diabetes mellitus with diabetic chronic kidney disease: Secondary | ICD-10-CM | POA: Diagnosis not present

## 2020-09-03 DIAGNOSIS — I428 Other cardiomyopathies: Secondary | ICD-10-CM | POA: Diagnosis not present

## 2020-09-03 DIAGNOSIS — C50912 Malignant neoplasm of unspecified site of left female breast: Secondary | ICD-10-CM | POA: Diagnosis not present

## 2020-09-03 DIAGNOSIS — N183 Chronic kidney disease, stage 3 unspecified: Secondary | ICD-10-CM | POA: Diagnosis not present

## 2020-09-03 DIAGNOSIS — I129 Hypertensive chronic kidney disease with stage 1 through stage 4 chronic kidney disease, or unspecified chronic kidney disease: Secondary | ICD-10-CM | POA: Diagnosis not present

## 2020-09-09 ENCOUNTER — Encounter: Payer: Self-pay | Admitting: *Deleted

## 2020-09-10 ENCOUNTER — Ambulatory Visit: Payer: PPO | Admitting: Dermatology

## 2020-09-10 ENCOUNTER — Other Ambulatory Visit: Payer: Self-pay

## 2020-09-10 ENCOUNTER — Encounter: Payer: Self-pay | Admitting: Dermatology

## 2020-09-10 DIAGNOSIS — L821 Other seborrheic keratosis: Secondary | ICD-10-CM

## 2020-09-10 DIAGNOSIS — D2239 Melanocytic nevi of other parts of face: Secondary | ICD-10-CM | POA: Diagnosis not present

## 2020-09-10 DIAGNOSIS — L409 Psoriasis, unspecified: Secondary | ICD-10-CM

## 2020-09-10 DIAGNOSIS — D229 Melanocytic nevi, unspecified: Secondary | ICD-10-CM

## 2020-09-10 MED ORDER — CALCIPOTRIENE 0.005 % EX CREA: TOPICAL_CREAM | Freq: Two times a day (BID) | CUTANEOUS | 2 refills | Status: DC

## 2020-09-10 MED ORDER — BETAMETHASONE DIPROPIONATE AUG 0.05 % EX CREA: TOPICAL_CREAM | Freq: Two times a day (BID) | CUTANEOUS | 2 refills | Status: DC

## 2020-09-11 ENCOUNTER — Encounter: Payer: Self-pay | Admitting: Dermatology

## 2020-09-11 NOTE — Progress Notes (Signed)
   New Patient   Subjective  [name] is a 64 y.o. female who presents for the following: Follow-up (psoriasis- "better" tx betamethasone cr).  Growth Location: Left shin duration:  Quality: Years, stable Associated Signs/Symptoms: Modifying Factors:  Severity:  Timing: Context:    The following portions of the chart were reviewed this encounter and updated as appropriate: Tobacco  Allergies  Meds  Problems  Med Hx  Surg Hx  Fam Hx      Objective  Well appearing patient in no apparent distress; mood and affect are within normal limits.  A focused examination was performed including Head, neck, legs. Relevant physical exam findings are noted in the Assessment and Plan. Plus arms.   Assessment & Plan  Seborrheic keratosis Left Lower Leg - Anterior  Patient only wants removal if it is cancer so this can be left if stable.  Psoriasis (4) Left Elbow - Posterior; Right Elbow - Posterior; Left Knee - Anterior; Right Knee - Anterior  Betamethasone of benefit but never gets mostly clear so we will add a generic calcipotriene cream.  She may apply these together daily for the next 6 weeks and she is not contact me via MyChart for follow-up by status.  Reordered Medications augmented betamethasone dipropionate (DIPROLENE-AF) 0.05 % cream  Nevus Left Malar Cheek  Reassure, follow-up as needed.

## 2020-09-15 ENCOUNTER — Other Ambulatory Visit: Payer: Self-pay | Admitting: Oncology

## 2020-09-30 ENCOUNTER — Inpatient Hospital Stay: Payer: PPO

## 2020-09-30 ENCOUNTER — Inpatient Hospital Stay: Payer: PPO | Attending: Oncology | Admitting: Oncology

## 2020-09-30 ENCOUNTER — Encounter: Payer: Self-pay | Admitting: Oncology

## 2020-09-30 ENCOUNTER — Other Ambulatory Visit: Payer: Self-pay

## 2020-09-30 ENCOUNTER — Other Ambulatory Visit: Payer: Self-pay | Admitting: *Deleted

## 2020-09-30 VITALS — BP 119/90 | HR 57 | Temp 98.5°F | Resp 16 | Ht 60.0 in | Wt 193.8 lb

## 2020-09-30 DIAGNOSIS — E785 Hyperlipidemia, unspecified: Secondary | ICD-10-CM | POA: Insufficient documentation

## 2020-09-30 DIAGNOSIS — C50812 Malignant neoplasm of overlapping sites of left female breast: Secondary | ICD-10-CM

## 2020-09-30 DIAGNOSIS — Z87891 Personal history of nicotine dependence: Secondary | ICD-10-CM | POA: Diagnosis not present

## 2020-09-30 DIAGNOSIS — Z853 Personal history of malignant neoplasm of breast: Secondary | ICD-10-CM | POA: Diagnosis not present

## 2020-09-30 DIAGNOSIS — Z923 Personal history of irradiation: Secondary | ICD-10-CM | POA: Insufficient documentation

## 2020-09-30 DIAGNOSIS — Z806 Family history of leukemia: Secondary | ICD-10-CM | POA: Insufficient documentation

## 2020-09-30 DIAGNOSIS — Z7982 Long term (current) use of aspirin: Secondary | ICD-10-CM | POA: Diagnosis not present

## 2020-09-30 DIAGNOSIS — Z170 Estrogen receptor positive status [ER+]: Secondary | ICD-10-CM

## 2020-09-30 DIAGNOSIS — E119 Type 2 diabetes mellitus without complications: Secondary | ICD-10-CM | POA: Insufficient documentation

## 2020-09-30 DIAGNOSIS — Z79899 Other long term (current) drug therapy: Secondary | ICD-10-CM | POA: Insufficient documentation

## 2020-09-30 DIAGNOSIS — Z8249 Family history of ischemic heart disease and other diseases of the circulatory system: Secondary | ICD-10-CM | POA: Diagnosis not present

## 2020-09-30 DIAGNOSIS — Z8043 Family history of malignant neoplasm of testis: Secondary | ICD-10-CM | POA: Diagnosis not present

## 2020-09-30 DIAGNOSIS — Z803 Family history of malignant neoplasm of breast: Secondary | ICD-10-CM | POA: Diagnosis not present

## 2020-09-30 DIAGNOSIS — Z9011 Acquired absence of right breast and nipple: Secondary | ICD-10-CM | POA: Diagnosis not present

## 2020-09-30 DIAGNOSIS — Z833 Family history of diabetes mellitus: Secondary | ICD-10-CM | POA: Diagnosis not present

## 2020-09-30 DIAGNOSIS — Z95828 Presence of other vascular implants and grafts: Secondary | ICD-10-CM

## 2020-09-30 DIAGNOSIS — N183 Chronic kidney disease, stage 3 unspecified: Secondary | ICD-10-CM | POA: Diagnosis not present

## 2020-09-30 DIAGNOSIS — Z801 Family history of malignant neoplasm of trachea, bronchus and lung: Secondary | ICD-10-CM | POA: Insufficient documentation

## 2020-09-30 DIAGNOSIS — Z08 Encounter for follow-up examination after completed treatment for malignant neoplasm: Secondary | ICD-10-CM

## 2020-09-30 DIAGNOSIS — Z79811 Long term (current) use of aromatase inhibitors: Secondary | ICD-10-CM | POA: Diagnosis not present

## 2020-09-30 DIAGNOSIS — C50912 Malignant neoplasm of unspecified site of left female breast: Secondary | ICD-10-CM | POA: Diagnosis not present

## 2020-09-30 DIAGNOSIS — I110 Hypertensive heart disease with heart failure: Secondary | ICD-10-CM | POA: Diagnosis not present

## 2020-09-30 DIAGNOSIS — I5042 Chronic combined systolic (congestive) and diastolic (congestive) heart failure: Secondary | ICD-10-CM | POA: Diagnosis not present

## 2020-09-30 DIAGNOSIS — Z794 Long term (current) use of insulin: Secondary | ICD-10-CM | POA: Insufficient documentation

## 2020-09-30 MED ORDER — HEPARIN SOD (PORK) LOCK FLUSH 100 UNIT/ML IV SOLN
INTRAVENOUS | Status: AC
  Filled 2020-09-30: qty 5

## 2020-09-30 MED ORDER — SODIUM CHLORIDE 0.9% FLUSH
10.0000 mL | Freq: Once | INTRAVENOUS | Status: AC
  Administered 2020-09-30: 10 mL
  Filled 2020-09-30: qty 10

## 2020-09-30 MED ORDER — LIDOCAINE-PRILOCAINE 2.5-2.5 % EX CREA: TOPICAL_CREAM | CUTANEOUS | 3 refills | Status: DC

## 2020-09-30 MED ORDER — HEPARIN SOD (PORK) LOCK FLUSH 100 UNIT/ML IV SOLN
500.0000 [IU] | Freq: Once | INTRAVENOUS | Status: AC
  Administered 2020-09-30: 500 [IU]
  Filled 2020-09-30: qty 5

## 2020-09-30 NOTE — Progress Notes (Signed)
No breast concerns, she still has issues with constipation- uses miralax, offered her to take sennakot and see if that helps. Eating good, feeling good.

## 2020-10-03 ENCOUNTER — Ambulatory Visit: Payer: PPO

## 2020-10-05 NOTE — Progress Notes (Signed)
   Hematology/Oncology Consult note [facility]  [phone] [phone]  Patient Care Team: Masoud, Javed, MD as PCP - General (Internal Medicine) Hochrein, James, MD as PCP - Cardiology (Cardiology) Balan, Bindubal, MD as Referring Physician (Endocrinology) Aryal, Govinda, MD as Consulting Physician (Rheumatology) Chrystal, Glenn, MD as Referring Physician (Radiation Oncology) Rao, Archana C, MD as Consulting Physician (Oncology) Toth, Paul III, MD as Consulting Physician (General Surgery) Lambert, Sheena M, RN as Registered Nurse Shaver, Anne F, RN as Registered Nurse Tafeen, Stuart, MD as Consulting Physician (Dermatology)   Name of the patient: [name]  [id number]  10/18/1956   Date of visit: 10/05/20  Diagnosis- Invasive lobular carcinoma of theleftbreast pathological prognostic stage Ib pT2 pN2 acM0 ER/PR positive HER-2/neu negative s/p mastectomy and targeted node dissection  Chief complaint/ Reason for visit-routine follow-up of breast cancer to receive Zometa  Heme/Onc history: Patient is a 64-year-old female with a past medical history significant for hypertension, CKD, diabetes, nonischemic cardiomyopathy among other medical problems. She has not undergone a mammogram for several years. Mammogram on October 2020 was done after she had a palpable area of concern in the left breast which showed an irregular mass with increased vascularity measuring 3.1 x 2.4 x 4.5 cm. The nodularity extended towards the nipple measuring at least 5.4 cm. Single abnormal lymph node in the left axilla with cortical thickness of 0.7 cm. 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  PET scan showed hypermetabolic left axillary lymph nodes which was reviewed at tumor board and they wereat least  found to be 3-4. Retroareolar left breast mass 2.5 x 3.2 cm in size. No evidence of distant metastatic disease. There were small to borderline enlarged retroperitoneal lymph nodes 10 mm in size with an SUV of 4.3.Retroperitoneal lymph nodes will require follow-up in the future  MRI showed 2 sites of biopsy-proven malignancy in the lateral left breast at posterior and anterior depth. There is non-mass enhancement spanning between the 2 sites. Morphologically abnormal level 1 axillary lymph node consistent with biopsy-proven metastatic disease.  Final pathology showed 2 separate tumors one which was 4.5 cm and the other one was 4.4 cm in size. Grade 2 ER/PR positive HER-2/neu negative with negative margins. 5 out of 11 lymph nodes were positive for malignancy. Extranodal extension present. Largest size of metastatic deposit 14 mm. mpT2 mpN2  Patient is not a candidate for anthracycline-based chemotherapy per cardiology. Adjuvant TC chemotherapycompleted on 03/11/2020.  She completed adjuvant radiation therapy as well and started Arimidex which she could not tolerate and was to Aromasin on 06/19/2020  Interval history-reports doing well and denies any new breast concerns at this time.  She was found to have a skin rash around the time that she was receiving chemotherapy and we had tried topical steroids and had eventually referred her to dermatology.  She was eventually diagnosed with psoriasis and currently continues to take topical steroids.  Skin rash has remained stable since then.  ECOG PS- 1 Pain scale- 0   Review of systems- Review of Systems  Constitutional: Negative for chills, fever, malaise/fatigue and weight loss.  HENT: Negative for congestion, ear discharge and nosebleeds.   Eyes: Negative for blurred vision.  Respiratory: Negative for cough, hemoptysis, sputum production, shortness of breath and wheezing.   Cardiovascular: Negative for chest pain, palpitations, orthopnea    and claudication.  Gastrointestinal: Negative for abdominal pain, blood in stool, constipation, diarrhea, heartburn, melena, nausea and vomiting.  Genitourinary: Negative for dysuria, flank pain, frequency, hematuria and urgency.  Musculoskeletal: Negative for back pain, joint pain and myalgias.  Skin: Positive for rash.  Neurological: Negative for dizziness, tingling, focal weakness, seizures, weakness and headaches.  Endo/Heme/Allergies: Does not bruise/bleed easily.  Psychiatric/Behavioral: Negative for depression and suicidal ideas. The patient does not have insomnia.       Allergies  Allergen Reactions  . Hydralazine Swelling    Eye Swelling  . Other Other (See Comments)    Unknown blood pressure medication caused face swelling     Past Medical History:  Diagnosis Date  . Arthritis of both feet    Gout  . Cancer (HCC)    Breast Cancer on left  . Chronic combined systolic and diastolic CHF (congestive heart failure) (HCC)    a. Echo (06/2014): Severe LVH, EF 20-25%, no RWMA, Gr 3 DD, trivial MR, mild LAE, mild RVE, PASP 38 mmHg .>> b. Echo 2/16 EF 50-55% //  // c. Echo 02/2019: EF 50-55, mod LVH, Gr 1 DD, mild LAE // Echocardiogram 02/2020: EF 60-65, no RWMA, mild LVH, Gr 1 DD, normal RVSF, RVSP 30.5   . CKD (chronic kidney disease), stage III (HCC)    now stage 4  . Diabetes mellitus type 2 in obese (HCC)   . Diverticular disease   . Dyspnea   . Family history of breast cancer   . Family history of leukemia   . Family history of lung cancer   . Family history of testicular cancer   . Family history of throat cancer   . Fatty liver   . History of GI diverticular bleed   . Hyperlipidemia   . Hyperphosphatemia   . Hypertension   . Hypertensive heart disease    a. Renal Artery Duplex (8/15):  no RAS  . Iron deficiency anemia   . NICM (nonischemic cardiomyopathy) (HCC)    a. Nuclear (06/2014): EF 32%, apical thinning, no definite scar or ischemia;  b. Echo (2/16):  Mild  LVH, EF 50-55%, Gr 2 DD, no RWMA  . Obesity   . PONV (postoperative nausea and vomiting)    after D and C  . Proteinuria   . Psoriasis   . PUD (peptic ulcer disease)   . Secondary hyperparathyroidism, renal (HCC)   . Tobacco abuse   . UTI (lower urinary tract infection)   . Vaginal dryness, menopausal   . Vaginal yeast infection      Past Surgical History:  Procedure Laterality Date  . COLONOSCOPY    . DILATION AND CURETTAGE OF UTERUS    . MASTECTOMY W/ SENTINEL NODE BIOPSY Left 11/12/2019  . MASTECTOMY W/ SENTINEL NODE BIOPSY Left 11/12/2019   Procedure: LEFT MASTECTOMY WITH SENTINEL LYMPH NODE BIOPSY AND TARGETED NODE DISSECTION;  Surgeon: Toth, Paul III, MD;  Location: [facility] OR;  Service: General;  Laterality: Left;  . None    . PORTA CATH INSERTION N/A 12/11/2019   Procedure: PORTA CATH INSERTION;  Surgeon: Schnier, Gregory G, MD;  Location: [facility] INVASIVE CV LAB;  Service: Cardiovascular;  Laterality: N/A;    Social History   Socioeconomic History  . Marital status: Widowed    Spouse name: Not on file  . Number of children: 2  . Years of education: 7  . Highest education level: Not on file  Occupational History  . Not on file  Tobacco Use  .   Smoking status: Former Smoker    Quit date: 07/05/2014    Years since quitting: 6.2  . Smokeless tobacco: Never Used  Vaping Use  . Vaping Use: Never used  Substance and Sexual Activity  . Alcohol use: No  . Drug use: No  . Sexual activity: Not Currently  Other Topics Concern  . Not on file  Social History Narrative   Currently lives in a house with her husband.    Fun: Watch TV.   Denies religious beliefs effecting health care.    Social Determinants of Health   Financial Resource Strain:   . Difficulty of Paying Living Expenses: Not on file  Food Insecurity:   . Worried About Running Out of Food in the Last Year: Not on file  . Ran Out of Food in the Last Year: Not on file  Transportation Needs:   . Lack of  Transportation (Medical): Not on file  . Lack of Transportation (Non-Medical): Not on file  Physical Activity:   . Days of Exercise per Week: Not on file  . Minutes of Exercise per Session: Not on file  Stress:   . Feeling of Stress : Not on file  Social Connections:   . Frequency of Communication with Friends and Family: Not on file  . Frequency of Social Gatherings with Friends and Family: Not on file  . Attends Religious Services: Not on file  . Active Member of Clubs or Organizations: Not on file  . Attends Club or Organization Meetings: Not on file  . Marital Status: Not on file  Intimate Partner Violence:   . Fear of Current or Ex-Partner: Not on file  . Emotionally Abused: Not on file  . Physically Abused: Not on file  . Sexually Abused: Not on file    Family History  Problem Relation Age of Onset  . Heart attack Paternal Grandfather   . Stroke Paternal Grandmother   . Hypertension Paternal Grandmother   . Kidney disease Mother   . Diabetes Mother   . Testicular cancer Father        diagnosed older than 50  . Coronary artery disease Other   . Hypertension Other   . Breast cancer Sister        diagnosed late 40s, negative genetics  . Leukemia Brother        chronic  . Throat cancer Brother   . Pneumonia Sister 2  . Lung cancer Paternal Uncle        diagnosed over 50     Current Outpatient Medications:  .  acetaminophen (TYLENOL) 500 MG tablet, Take 1,000 mg by mouth every 6 (six) hours as needed for mild pain, moderate pain or headache. For pain, Disp: , Rfl:  .  allopurinol (ZYLOPRIM) 100 MG tablet, Take 200 mg by mouth daily., Disp: , Rfl:  .  aspirin EC 81 MG EC tablet, Take 1 tablet (81 mg total) by mouth daily., Disp: , Rfl:  .  atorvastatin (LIPITOR) 40 MG tablet, Take 1 tablet (40 mg total) by mouth daily at 6 PM. Patient needs appointment for future refills.  Please call [phone]. 1st attempt., Disp: 90 tablet, Rfl: 1 .  augmented betamethasone  dipropionate (DIPROLENE-AF) 0.05 % cream, Apply topically 2 (two) times daily., Disp: 50 g, Rfl: 2 .  B-D ULTRAFINE III SHORT PEN 31G X 8 MM MISC, USE AS DIRECTED *EMERGENCY FILL*, Disp: , Rfl: 0 .  betamethasone dipropionate 0.05 % cream, Apply topically 2 (two) times daily., Disp: 30   g, Rfl: 0 .  blood glucose meter kit and supplies KIT, Dispense based on patient and insurance preference. Use up to four times daily as directed. (FOR ICD-9 250.00, 250.01)., Disp: 1 each, Rfl: 0 .  calcipotriene (DOVONOX) 0.005 % cream, Apply topically 2 (two) times daily., Disp: 120 g, Rfl: 2 .  calcitRIOL (ROCALTROL) 0.25 MCG capsule, Take 0.25 mcg by mouth daily., Disp: , Rfl:  .  Calcium Carbonate-Vitamin D3 (CALCIUM 600-D) 600-400 MG-UNIT TABS, Take 1 tablet by mouth in the morning and at bedtime., Disp: , Rfl:  .  Colchicine 0.6 MG CAPS, Take 0.6 mg by mouth every other day. , Disp: , Rfl:  .  exemestane (AROMASIN) 25 MG tablet, TAKE 1 TABLET (25 MG TOTAL) BY MOUTH DAILY AFTER BREAKFAST., Disp: 90 tablet, Rfl: 1 .  furosemide (LASIX) 40 MG tablet, Take 1 tablet (40 mg total) by mouth daily as needed for fluid or edema., Disp: 30 tablet, Rfl:  .  gabapentin (NEURONTIN) 100 MG capsule, Take 2 capsules (200 mg total) by mouth 3 (three) times daily., Disp: 180 capsule, Rfl: 1 .  insulin aspart (NOVOLOG FLEXPEN) 100 UNIT/ML FlexPen, Inject 5 Units into the skin 3 (three) times daily with meals. Additional insulin (5 units PLUS) CBG 70 - 120: 0 units CBG 121 - 150: 1 unit CBG 151 - 200: 2 units CBG 201 - 250: 3 units CBG 251 - 350: 4 units CBG 351 - 400: 6 units (Patient taking differently: Inject 5 Units into the skin 3 (three) times daily with meals. Sliding scale  5 units/ plus base), Disp: 15 mL, Rfl: 11 .  insulin glargine (LANTUS) 100 UNIT/ML injection, Inject 20 Units into the skin at bedtime. , Disp: , Rfl:  .  isosorbide mononitrate (IMDUR) 60 MG 24 hr tablet, Take 1 tablet (60 mg total) by mouth daily., Disp:  90 tablet, Rfl: 3 .  LORazepam (ATIVAN) 0.5 MG tablet, Take 1 tablet (0.5 mg total) by mouth every 6 (six) hours as needed (Nausea or vomiting)., Disp: 30 tablet, Rfl: 0 .  metoprolol tartrate (LOPRESSOR) 25 MG tablet, TAKE 1 TABLET BY MOUTH TWICE A DAY, Disp: 180 tablet, Rfl: 3 .  ONE TOUCH ULTRA TEST test strip, 1 each by Other route 4 (four) times daily. , Disp: , Rfl: 1 .  ONETOUCH DELICA LANCETS 67M MISC, Inject 33 g into the skin 4 (four) times daily. , Disp: , Rfl: 1 .  polyethylene glycol (MIRALAX / GLYCOLAX) 17 g packet, Take 17 g by mouth daily as needed for moderate constipation., Disp: , Rfl:  .  telmisartan (MICARDIS) 20 MG tablet, TAKE 1 TABLET BY MOUTH EVERY DAY, Disp: 90 tablet, Rfl: 1 .  lidocaine-prilocaine (EMLA) cream, Apply to affected area once, Disp: 30 g, Rfl: 3  Physical exam:  Vitals:   09/30/20 0956  BP: 119/90  Pulse: (!) 57  Resp: 16  Temp: 98.5 F (36.9 C)  TempSrc: Oral  Weight: 193 lb 12.8 oz (87.9 kg)  Height: 5' (1.524 m)   Physical Exam Constitutional:      General: She is not in acute distress. Cardiovascular:     Rate and Rhythm: Normal rate and regular rhythm.     Heart sounds: Normal heart sounds.  Pulmonary:     Effort: Pulmonary effort is normal.     Breath sounds: Normal breath sounds.  Abdominal:     General: Bowel sounds are normal.     Palpations: Abdomen is soft.  Skin:  General: Skin is warm and dry.     Comments: Ill-defined scattered erythematous skin rash noted over bilateral upper extremities  Neurological:     Mental Status: She is alert and oriented to person, place, and time.   Patient is s/p left mastectomy without reconstruction.  No evidence of chest wall recurrence.  No palpable masses in the right breast.  No palpable bilateral axillary adenopathy.  CMP Latest Ref Rng & Units 06/30/2020  Glucose 70 - 99 mg/dL 148(H)  BUN 8 - 23 mg/dL 55(H)  Creatinine 0.44 - 1.00 mg/dL 2.17(H)  Sodium 135 - 145 mmol/L 139   Potassium 3.5 - 5.1 mmol/L 4.1  Chloride 98 - 111 mmol/L 101  CO2 22 - 32 mmol/L 28  Calcium 8.9 - 10.3 mg/dL 9.0  Total Protein 6.5 - 8.1 g/dL 6.9  Total Bilirubin 0.3 - 1.2 mg/dL 0.7  Alkaline Phos 38 - 126 U/L 106  AST 15 - 41 U/L 25  ALT 0 - 44 U/L 33   CBC Latest Ref Rng & Units 05/21/2020  WBC 4.0 - 10.5 K/uL 4.9  Hemoglobin 12.0 - 15.0 g/dL 13.3  Hematocrit 36 - 46 % 41.1  Platelets 150 - 400 K/uL 161    Assessment and plan- Patient is a 64 y.o. female  with pathological prognostic stage Ib invasive lobular carcinoma pT2 pN2 acM0 ER/PR positive HER-2/neu negative s/pleft mastectomy and targeted node dissection.She has completed 4 cycles of adjuvant TC chemotherapyAnd adjuvant radiation therapy.    She is currently on Aromasin and this is a routine follow-up visit for breast cancer  Clinically patient is doing well with no concerning signs and symptoms of recurrence based on today's exam.  She is tolerating Aromasin along with calcium and vitamin D well.  I will see her back in 3 weeks with labs CBC with differential and CMP and to receive Zometa.  We have dose adjusted her Zometa to 3 mg given her low GFR.  Skin rash: Consistent with psoriasis for which she follows up with dermatology Visit Diagnosis 1. Encounter for follow-up surveillance of breast cancer   2. Use of exemestane (Aromasin)      Dr. Randa Evens, MD, MPH [facility] at [facility] [phone] 10/05/2020 5:34 PM

## 2020-10-06 ENCOUNTER — Ambulatory Visit
Admission: RE | Admit: 2020-10-06 | Discharge: 2020-10-06 | Disposition: A | Discharge status: Home / Self Care | Payer: PPO | Source: Ambulatory Visit | Attending: Oncology | Admitting: Oncology

## 2020-10-06 ENCOUNTER — Other Ambulatory Visit: Payer: Self-pay

## 2020-10-06 DIAGNOSIS — Z853 Personal history of malignant neoplasm of breast: Secondary | ICD-10-CM | POA: Insufficient documentation

## 2020-10-06 DIAGNOSIS — Z08 Encounter for follow-up examination after completed treatment for malignant neoplasm: Secondary | ICD-10-CM | POA: Diagnosis not present

## 2020-10-06 DIAGNOSIS — Z1231 Encounter for screening mammogram for malignant neoplasm of breast: Secondary | ICD-10-CM | POA: Diagnosis not present

## 2020-10-06 HISTORY — DX: Personal history of antineoplastic chemotherapy: Z92.21

## 2020-10-06 HISTORY — DX: Personal history of irradiation: Z92.3

## 2020-10-30 ENCOUNTER — Other Ambulatory Visit: Payer: Self-pay | Admitting: Physician Assistant

## 2020-10-30 MED ORDER — ISOSORBIDE MONONITRATE ER 60 MG PO TB24
60.0000 mg | ORAL_TABLET | Freq: Every day | ORAL | 0 refills | Status: DC
Start: 2020-10-30 — End: 2021-01-01

## 2020-11-02 ENCOUNTER — Other Ambulatory Visit: Payer: Self-pay | Admitting: Physician Assistant

## 2020-11-11 ENCOUNTER — Other Ambulatory Visit: Payer: Self-pay

## 2020-11-11 ENCOUNTER — Encounter: Payer: Self-pay | Admitting: Physician Assistant

## 2020-11-11 ENCOUNTER — Telehealth (INDEPENDENT_AMBULATORY_CARE_PROVIDER_SITE_OTHER): Payer: PPO | Admitting: Physician Assistant

## 2020-11-11 VITALS — BP 124/79 | HR 67 | Ht 60.0 in | Wt 192.0 lb

## 2020-11-11 DIAGNOSIS — I110 Hypertensive heart disease with heart failure: Secondary | ICD-10-CM

## 2020-11-11 DIAGNOSIS — I5042 Chronic combined systolic (congestive) and diastolic (congestive) heart failure: Secondary | ICD-10-CM | POA: Diagnosis not present

## 2020-11-11 DIAGNOSIS — I5032 Chronic diastolic (congestive) heart failure: Secondary | ICD-10-CM | POA: Diagnosis not present

## 2020-11-11 DIAGNOSIS — N1831 Chronic kidney disease, stage 3a: Secondary | ICD-10-CM

## 2020-11-11 NOTE — Patient Instructions (Signed)
Medication Instructions:  Your physician recommends that you continue on your current medications as directed. Please refer to the Current Medication list given to you today.  *If you need a refill on your cardiac medications before your next appointment, please call your pharmacy*  Lab Work: None ordered today  Testing/Procedures: None ordered today  Follow-Up: At [facility], you and your health needs are our priority.  As part of our continuing mission to provide you with exceptional heart care, we have created designated Provider Care Teams.  These Care Teams include your primary Cardiologist (physician) and Advanced Practice Providers (APPs -  Physician Assistants and Nurse Practitioners) who all work together to provide you with the care you need, when you need it.  Your next appointment:   6 month(s)  The format for your next appointment:   In Person  Provider:   You may see Richardson Dopp, PA-C

## 2020-11-11 NOTE — Progress Notes (Signed)
Virtual Visit via Telephone Note   This visit type was conducted due to national recommendations for restrictions regarding the COVID-19 Pandemic (e.g. social distancing) in an effort to limit this patient's exposure and mitigate transmission in our community.  Due to her co-morbid illnesses, this patient is at least at moderate risk for complications without adequate follow up.  This format is felt to be most appropriate for this patient at this time.  The patient did not have access to video technology/had technical difficulties with video requiring transitioning to audio format only (telephone).  All issues noted in this document were discussed and addressed.  No physical exam could be performed with this format.  Please refer to the patient's chart for her  consent to telehealth for [facility].    Date:  11/11/2020   ID:  [name], DOB 1955/12/21, MRN [id number] The patient was identified using 2 identifiers.  Patient Location: Home Provider Location: Home Office  PCP:  [name], MD  Cardiologist:  Minus Breeding, MD  / Richardson Dopp, PA-C  Electrophysiologist:  None   Evaluation Performed:  Follow-Up Visit  Chief Complaint:  Follow-up (CHF, HTN)    Patient Profile: [name] is a 64 y.o. female with:  Hypertension  ? RA ultrasound 07/2014: neg for RA stenosis   Combined Systolic and Diastolic CHF ? Non-ischemic cardiomyopathy  ? Admx in 06/2014 w/ acute HF in setting of HTN emergency  ? Echocardiogram (06/2014): EF 20-25 ? Echocardiogram 2016: EF 50-55, Gr 2 DD ? Echocardiogram 02/2019: EF 50-55, Gr 1 DD ? Echo 3/21: EF 60-65, GR 1 DD ? True allergy to Hydralazine  ? Intol to Carvedilol  ? Myoview 7/15:  No scar, No ischemia   Chronic kidney disease   Diabetes mellitus   Breast CA ? S/p L Mastectomy 10/2019 ? Chemotherapy >> Taxotere, Cytoxan, Aloxi, Fulphila  Prior CV studies: Echo 02/28/2020 EF 60-65, no RWMA, mild LVH, GR 1 DD, normal RVSF, RVSP  30.5, mild aortic valve sclerosis without stenosis  Echo 02/26/2019 EF 50-55, mod LVH, Gr 1 DD, normal RVSF, mild LAE  Echo (01/27/15):  Mild LVH, EF 50-55%, normal wall motion, grade 2 diastolic dysfunction  Echo (06/2014): Severe LVH, EF 20-25%, no RWMA, Gr 3 DD, trivial MR, mild LAE, mild RVE, PASP 38 mmHg.  Nuclear (06/2014): EF 32%, apical thinning, no definite scar or ischemia   History of Present Illness:   Ms. Hartin was last seen in 2/21.  Since starting on chemotherapy for breast cancer, she has had difficulty with her blood pressures.  They have been running somewhat low at times and we have adjusted her medications.  She is seen for follow-up.     Since last seen, her nephrologist stopped her clonidine due to hypotension.  Since then, her blood pressures have mainly been in the optimal range.  She has had a few that were elevated.  She had 1 recent blood pressure that was 102/55.  She had no symptoms with this.  She has not had chest pain.  She has not had significant shortness of breath.  Overall, her leg edema is stable.  Past Medical History:  Diagnosis Date  . Arthritis of both feet    Gout  . Cancer [facility])    Breast Cancer on left  . Chronic combined systolic and diastolic CHF (congestive heart failure) ([place])    a. Echo (06/2014): Severe LVH, EF 20-25%, no RWMA, Gr 3 DD, trivial MR, mild LAE, mild RVE,  PASP 38 mmHg .>> b. Echo 2/16 EF 50-55% //  // c. Echo 02/2019: EF 50-55, mod LVH, Gr 1 DD, mild LAE // Echocardiogram 02/2020: EF 60-65, no RWMA, mild LVH, Gr 1 DD, normal RVSF, RVSP 30.5   . CKD (chronic kidney disease), stage III ([place])    now stage 4  . Diabetes mellitus type 2 in obese ([place])   . Diverticular disease   . Dyspnea   . Family history of breast cancer   . Family history of leukemia   . Family history of lung cancer   . Family history of testicular cancer   . Family history of throat cancer   . Fatty liver   . History of GI diverticular bleed   .  Hyperlipidemia   . Hyperphosphatemia   . Hypertension   . Hypertensive heart disease    a. Renal Artery Duplex (8/15):  no RAS  . Iron deficiency anemia   . NICM (nonischemic cardiomyopathy) ([place])    a. Nuclear (06/2014): EF 32%, apical thinning, no definite scar or ischemia;  b. Echo (2/16):  Mild LVH, EF 50-55%, Gr 2 DD, no RWMA  . Obesity   . Personal history of chemotherapy   . Personal history of radiation therapy   . PONV (postoperative nausea and vomiting)    after D and C  . Proteinuria   . Psoriasis   . PUD (peptic ulcer disease)   . Secondary hyperparathyroidism, renal ([place])   . Tobacco abuse   . UTI (lower urinary tract infection)   . Vaginal dryness, menopausal   . Vaginal yeast infection    Past Surgical History:  Procedure Laterality Date  . BREAST BIOPSY Left 2020   positive   . COLONOSCOPY    . DILATION AND CURETTAGE OF UTERUS    . MASTECTOMY Left 2020  . MASTECTOMY W/ SENTINEL NODE BIOPSY Left 11/12/2019  . MASTECTOMY W/ SENTINEL NODE BIOPSY Left 11/12/2019   Procedure: LEFT MASTECTOMY WITH SENTINEL LYMPH NODE BIOPSY AND TARGETED NODE DISSECTION;  Surgeon: Jovita Kussmaul, MD;  Location: [place];  Service: General;  Laterality: Left;  . None    . PORTA CATH INSERTION N/A 12/11/2019   Procedure: PORTA CATH INSERTION;  Surgeon: Katha Cabal, MD;  Location: [place] [facility];  Service: Cardiovascular;  Laterality: N/A;     Current Meds  Medication Sig  . acetaminophen (TYLENOL) 500 MG tablet Take 1,000 mg by mouth every 6 (six) hours as needed for mild pain, moderate pain or headache. For pain  . allopurinol (ZYLOPRIM) 100 MG tablet Take 200 mg by mouth daily.  Marland Kitchen aspirin EC 81 MG EC tablet Take 1 tablet (81 mg total) by mouth daily.  Marland Kitchen atorvastatin (LIPITOR) 40 MG tablet Take 1 tablet (40 mg total) by mouth daily at 6 PM. Patient needs appointment for future refills.  Please call [phone]. 1st attempt.  Marland Kitchen augmented betamethasone dipropionate  (DIPROLENE-AF) 0.05 % cream Apply topically 2 (two) times daily.  . B-D ULTRAFINE III SHORT PEN 31G X 8 MM MISC USE AS DIRECTED *EMERGENCY FILL*  . betamethasone dipropionate 0.05 % cream Apply topically 2 (two) times daily.  . blood glucose meter kit and supplies KIT Dispense based on patient and insurance preference. Use up to four times daily as directed. (FOR ICD-9 250.00, 250.01).  . calcipotriene (DOVONOX) 0.005 % cream Apply topically 2 (two) times daily.  . calcitRIOL (ROCALTROL) 0.25 MCG capsule Take 0.25 mcg by mouth daily.  . Calcium Carbonate-Vitamin  D3 (CALCIUM 600-D) 600-400 MG-UNIT TABS Take 1 tablet by mouth in the morning and at bedtime.  . Colchicine 0.6 MG CAPS Take 0.6 mg by mouth every other day.   . exemestane (AROMASIN) 25 MG tablet TAKE 1 TABLET (25 MG TOTAL) BY MOUTH DAILY AFTER BREAKFAST.  . furosemide (LASIX) 20 MG tablet Take 40 mg by mouth 2 (two) times daily.  Marland Kitchen gabapentin (NEURONTIN) 100 MG capsule Take 2 capsules (200 mg total) by mouth 3 (three) times daily.  . insulin aspart (NOVOLOG FLEXPEN) 100 UNIT/ML FlexPen Inject 5 Units into the skin 3 (three) times daily with meals. Additional insulin (5 units PLUS) CBG 70 - 120: 0 units CBG 121 - 150: 1 unit CBG 151 - 200: 2 units CBG 201 - 250: 3 units CBG 251 - 350: 4 units CBG 351 - 400: 6 units  . insulin glargine (LANTUS) 100 UNIT/ML injection Inject 20 Units into the skin at bedtime.   . isosorbide mononitrate (IMDUR) 60 MG 24 hr tablet Take 1 tablet (60 mg total) by mouth daily. Please make yearly appt with Dr. Antoine Poche for January 2022 for future refills. Thank you 1st attempt  . lidocaine-prilocaine (EMLA) cream Apply to affected area once  . LORazepam (ATIVAN) 0.5 MG tablet Take 1 tablet (0.5 mg total) by mouth every 6 (six) hours as needed (Nausea or vomiting).  . metoprolol tartrate (LOPRESSOR) 25 MG tablet TAKE 1 TABLET BY MOUTH TWICE A DAY  . ONE TOUCH ULTRA TEST test strip 1 each by Other route 4 (four)  times daily.   Letta Pate DELICA LANCETS 33G MISC Inject 33 g into the skin 4 (four) times daily.   . polyethylene glycol (MIRALAX / GLYCOLAX) 17 g packet Take 17 g by mouth daily as needed for moderate constipation.  Marland Kitchen telmisartan (MICARDIS) 20 MG tablet TAKE 1 TABLET BY MOUTH EVERY DAY  . [DISCONTINUED] furosemide (LASIX) 40 MG tablet Take 1 tablet (40 mg total) by mouth daily as needed for fluid or edema.     Allergies:   Hydralazine and Other   Social History   Tobacco Use  . Smoking status: Former Smoker    Quit date: 07/05/2014    Years since quitting: 6.3  . Smokeless tobacco: Never Used  Vaping Use  . Vaping Use: Never used  Substance Use Topics  . Alcohol use: No  . Drug use: No     Family Hx: The patient's family history includes Breast cancer in her sister; Coronary artery disease in an other family member; Diabetes in her mother; Heart attack in her paternal grandfather; Hypertension in her paternal grandmother and another family member; Kidney disease in her mother; Leukemia in her brother; Lung cancer in her paternal uncle; Pneumonia (age of onset: 2) in her sister; Stroke in her paternal grandmother; Testicular cancer in her father; Throat cancer in her brother.  ROS:   Please see the history of present illness.      Labs/Other Tests and Data Reviewed:    EKG:  No ECG reviewed.  Recent Labs: 05/21/2020: Hemoglobin 13.3; Platelets 161 06/30/2020: ALT 33; BUN 55; Creatinine, Ser 2.17; Potassium 4.1; Sodium 139   Recent Lipid Panel Lab Results  Component Value Date/Time   CHOL 112 01/31/2017 07:51 AM   TRIG 104 01/31/2017 07:51 AM   HDL 34 (L) 01/31/2017 07:51 AM   CHOLHDL 3.3 01/31/2017 07:51 AM   CHOLHDL 3.2 12/07/2015 04:49 AM   LDLCALC 57 01/31/2017 07:51 AM    Wt Readings  from Last 3 Encounters:  11/11/20 192 lb (87.1 kg)  09/30/20 193 lb 12.8 oz (87.9 kg)  07/09/20 189 lb 9.5 oz (86 kg)     Risk Assessment/Calculations:      Objective:     Vital Signs:  BP 124/79   Pulse 67   Ht 5' (1.524 m)   Wt 192 lb (87.1 kg)   BMI 37.50 kg/m    VITAL SIGNS:  reviewed GEN:  no acute distress PSYCH:  normal affect  ASSESSMENT & PLAN:    1. Chronic heart failure with preserved ejection fraction (HCC) EF has improved from 20-25 to normal.  Most recent echocardiogram in 3/21 demonstrated EF 60-65.  Continue current dose of isosorbide, metoprolol tartrate, telmisartan, furosemide.  2. Hypertensive heart disease with chronic combined systolic and diastolic congestive heart failure (HCC) Overall, her blood pressure seems to be well controlled.  Continue current dose of furosemide, isosorbide, metoprolol tartrate, telmisartan.  3. Stage 3a chronic kidney disease (HCC) Her creatinines now are in the 2 range.  She is followed by Dr. Johnney Ou with [facility].    Time:   Today, I have spent 11 minutes with the patient with telehealth technology discussing the above problems.     Medication Adjustments/Labs and Tests Ordered: Current medicines are reviewed at length with the patient today.  Concerns regarding medicines are outlined above.   Tests Ordered: No orders of the defined types were placed in this encounter.   Medication Changes: No orders of the defined types were placed in this encounter.   Follow Up:  In Person in 6 month(s)  Signed, Richardson Dopp, PA-C  11/11/2020 4:49 PM    [place]

## 2020-11-24 ENCOUNTER — Ambulatory Visit: Payer: PPO | Admitting: Dermatology

## 2020-11-26 ENCOUNTER — Other Ambulatory Visit: Payer: Self-pay | Admitting: Physician Assistant

## 2020-11-27 ENCOUNTER — Telehealth: Payer: Self-pay | Admitting: *Deleted

## 2020-11-27 NOTE — Telephone Encounter (Signed)
Pt called and said that she has been having hoarseness for several days and the felt like head cold. Has no fever. Cough when she has drainage in her throat. No fevers. No one in the family has any symptoms. She wanted to see if Dr. Janese Banks would give her atb. Dr. Janese Banks states that it seems like virus and at this time she should treat symptoms like tylenol for HA, cough syrup for cough, allergy pills to try to dry up drainage. Patient says ok. Pt says that first 3 days the pt took benadryl. Then she started using robitussin cough syrup and started zyrtec.  She is a little better but the drainage is not gone and sometimes has a cough. She will keep trying with meds she has.

## 2020-12-03 DIAGNOSIS — N183 Chronic kidney disease, stage 3 unspecified: Secondary | ICD-10-CM | POA: Diagnosis not present

## 2020-12-03 DIAGNOSIS — N2581 Secondary hyperparathyroidism of renal origin: Secondary | ICD-10-CM | POA: Diagnosis not present

## 2020-12-03 DIAGNOSIS — I428 Other cardiomyopathies: Secondary | ICD-10-CM | POA: Diagnosis not present

## 2020-12-03 DIAGNOSIS — D649 Anemia, unspecified: Secondary | ICD-10-CM | POA: Diagnosis not present

## 2020-12-03 DIAGNOSIS — E1122 Type 2 diabetes mellitus with diabetic chronic kidney disease: Secondary | ICD-10-CM | POA: Diagnosis not present

## 2020-12-03 DIAGNOSIS — M109 Gout, unspecified: Secondary | ICD-10-CM | POA: Diagnosis not present

## 2020-12-03 DIAGNOSIS — I129 Hypertensive chronic kidney disease with stage 1 through stage 4 chronic kidney disease, or unspecified chronic kidney disease: Secondary | ICD-10-CM | POA: Diagnosis not present

## 2020-12-03 DIAGNOSIS — C50912 Malignant neoplasm of unspecified site of left female breast: Secondary | ICD-10-CM | POA: Diagnosis not present

## 2020-12-10 ENCOUNTER — Ambulatory Visit: Payer: PPO | Admitting: Radiation Oncology

## 2020-12-16 ENCOUNTER — Encounter: Payer: Self-pay | Admitting: Radiation Oncology

## 2020-12-16 DIAGNOSIS — M1990 Unspecified osteoarthritis, unspecified site: Secondary | ICD-10-CM | POA: Insufficient documentation

## 2020-12-16 DIAGNOSIS — M109 Gout, unspecified: Secondary | ICD-10-CM | POA: Insufficient documentation

## 2020-12-16 DIAGNOSIS — E7800 Pure hypercholesterolemia, unspecified: Secondary | ICD-10-CM | POA: Insufficient documentation

## 2020-12-16 DIAGNOSIS — E1165 Type 2 diabetes mellitus with hyperglycemia: Secondary | ICD-10-CM | POA: Insufficient documentation

## 2020-12-16 DIAGNOSIS — I10 Essential (primary) hypertension: Secondary | ICD-10-CM | POA: Insufficient documentation

## 2020-12-16 DIAGNOSIS — E1140 Type 2 diabetes mellitus with diabetic neuropathy, unspecified: Secondary | ICD-10-CM | POA: Insufficient documentation

## 2020-12-17 ENCOUNTER — Other Ambulatory Visit: Payer: Self-pay

## 2020-12-17 ENCOUNTER — Ambulatory Visit
Admission: RE | Admit: 2020-12-17 | Discharge: 2020-12-17 | Disposition: A | Discharge status: Home / Self Care | Payer: PPO | Source: Ambulatory Visit | Attending: Radiation Oncology | Admitting: Radiation Oncology

## 2020-12-17 ENCOUNTER — Encounter: Payer: Self-pay | Admitting: Radiation Oncology

## 2020-12-17 VITALS — BP 136/87 | HR 62 | Temp 96.6°F | Resp 16 | Wt 190.1 lb

## 2020-12-17 DIAGNOSIS — Z923 Personal history of irradiation: Secondary | ICD-10-CM | POA: Insufficient documentation

## 2020-12-17 DIAGNOSIS — Z79811 Long term (current) use of aromatase inhibitors: Secondary | ICD-10-CM | POA: Diagnosis not present

## 2020-12-17 DIAGNOSIS — Z9012 Acquired absence of left breast and nipple: Secondary | ICD-10-CM | POA: Diagnosis not present

## 2020-12-17 DIAGNOSIS — C50812 Malignant neoplasm of overlapping sites of left female breast: Secondary | ICD-10-CM | POA: Diagnosis not present

## 2020-12-17 DIAGNOSIS — Z170 Estrogen receptor positive status [ER+]: Secondary | ICD-10-CM | POA: Diagnosis not present

## 2020-12-17 DIAGNOSIS — Z08 Encounter for follow-up examination after completed treatment for malignant neoplasm: Secondary | ICD-10-CM | POA: Diagnosis not present

## 2020-12-17 NOTE — Progress Notes (Signed)
Radiation Oncology Follow up Note  Name: [name]   Date:   12/17/2020 MRN:  [id number] DOB: 10-23-1956    This 64 y.o. 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  Seen today in routine follow-up she is doing well.  She specifically denies any chest wall masses or nodularity or any masses in her right breast.  She is having no swelling in her left upper extremity..  She had a right mammogram back in October which was BI-RADS 1 negative.  She is currently on Aromasin tolerating it well without side effect.  COMPLICATIONS OF TREATMENT: none  FOLLOW UP COMPLIANCE: keeps appointments   PHYSICAL EXAM:  BP 136/87 (BP Location: Left Arm, Patient Position: Sitting)   Pulse 62   Temp (!) 96.6 F (35.9 C) (Tympanic)   Resp 16   Wt 190 lb 1.6 oz (86.2 kg)   BMI 37.13 kg/m  Patient status post left modified radical mastectomy chest wall is well-healed without evidence of mass or nodularity no lymphedema of her right upper extremity is noted right breast is free of dominant mass.  Well-developed well-nourished patient in NAD. HEENT reveals PERLA, EOMI, discs not visualized.  Oral cavity is clear. No oral mucosal lesions are identified. Neck is clear without evidence of cervical or supraclavicular adenopathy. Lungs are clear to A&P. Cardiac examination is essentially unremarkable with regular rate and rhythm without murmur rub or thrill. Abdomen is benign with no  organomegaly or masses noted. Motor sensory and DTR levels are equal and symmetric in the upper and lower extremities. Cranial nerves II through XII are grossly intact. Proprioception is intact. No peripheral adenopathy or edema is identified. No motor or sensory levels are noted. Crude visual fields are within normal range.  RADIOLOGY RESULTS: Mammogram her right breast is reviewed compatible with above-stated findings  PLAN: Present time patient is 6 months out with no evidence of disease.  I am pleased with her overall progress.  I have asked to see her back in 6 months for follow-up and will start once year follow-up visits.  Patient knows to call with any concerns.  She continues on Aromasin without side effect.  I would like to take this opportunity to thank you for allowing me to participate in the care of your patient..Noreene Filbert MD

## 2020-12-27 ENCOUNTER — Other Ambulatory Visit: Payer: Self-pay | Admitting: Dermatology

## 2020-12-27 DIAGNOSIS — L409 Psoriasis, unspecified: Secondary | ICD-10-CM

## 2021-01-01 ENCOUNTER — Other Ambulatory Visit: Payer: Self-pay | Admitting: *Deleted

## 2021-01-01 ENCOUNTER — Other Ambulatory Visit: Payer: Self-pay | Admitting: Cardiology

## 2021-01-01 DIAGNOSIS — Z170 Estrogen receptor positive status [ER+]: Secondary | ICD-10-CM

## 2021-01-01 DIAGNOSIS — C50812 Malignant neoplasm of overlapping sites of left female breast: Secondary | ICD-10-CM

## 2021-01-02 ENCOUNTER — Inpatient Hospital Stay (HOSPITAL_BASED_OUTPATIENT_CLINIC_OR_DEPARTMENT_OTHER): Payer: PPO | Admitting: Oncology

## 2021-01-02 ENCOUNTER — Encounter: Payer: Self-pay | Admitting: Oncology

## 2021-01-02 ENCOUNTER — Inpatient Hospital Stay: Payer: PPO | Attending: Oncology

## 2021-01-02 ENCOUNTER — Inpatient Hospital Stay: Payer: PPO

## 2021-01-02 VITALS — BP 121/77 | HR 74 | Temp 98.0°F | Resp 18 | Wt 195.9 lb

## 2021-01-02 DIAGNOSIS — Z9012 Acquired absence of left breast and nipple: Secondary | ICD-10-CM | POA: Diagnosis not present

## 2021-01-02 DIAGNOSIS — Z7983 Long term (current) use of bisphosphonates: Secondary | ICD-10-CM

## 2021-01-02 DIAGNOSIS — Z794 Long term (current) use of insulin: Secondary | ICD-10-CM | POA: Insufficient documentation

## 2021-01-02 DIAGNOSIS — Z803 Family history of malignant neoplasm of breast: Secondary | ICD-10-CM | POA: Insufficient documentation

## 2021-01-02 DIAGNOSIS — Z853 Personal history of malignant neoplasm of breast: Secondary | ICD-10-CM

## 2021-01-02 DIAGNOSIS — Z806 Family history of leukemia: Secondary | ICD-10-CM | POA: Diagnosis not present

## 2021-01-02 DIAGNOSIS — Z7982 Long term (current) use of aspirin: Secondary | ICD-10-CM | POA: Diagnosis not present

## 2021-01-02 DIAGNOSIS — C50812 Malignant neoplasm of overlapping sites of left female breast: Secondary | ICD-10-CM

## 2021-01-02 DIAGNOSIS — E785 Hyperlipidemia, unspecified: Secondary | ICD-10-CM | POA: Insufficient documentation

## 2021-01-02 DIAGNOSIS — I110 Hypertensive heart disease with heart failure: Secondary | ICD-10-CM | POA: Insufficient documentation

## 2021-01-02 DIAGNOSIS — N2581 Secondary hyperparathyroidism of renal origin: Secondary | ICD-10-CM | POA: Insufficient documentation

## 2021-01-02 DIAGNOSIS — I5042 Chronic combined systolic (congestive) and diastolic (congestive) heart failure: Secondary | ICD-10-CM | POA: Diagnosis not present

## 2021-01-02 DIAGNOSIS — M8580 Other specified disorders of bone density and structure, unspecified site: Secondary | ICD-10-CM | POA: Diagnosis not present

## 2021-01-02 DIAGNOSIS — Z923 Personal history of irradiation: Secondary | ICD-10-CM | POA: Diagnosis not present

## 2021-01-02 DIAGNOSIS — N183 Chronic kidney disease, stage 3 unspecified: Secondary | ICD-10-CM | POA: Diagnosis not present

## 2021-01-02 DIAGNOSIS — Z9221 Personal history of antineoplastic chemotherapy: Secondary | ICD-10-CM | POA: Diagnosis not present

## 2021-01-02 DIAGNOSIS — Z87891 Personal history of nicotine dependence: Secondary | ICD-10-CM | POA: Insufficient documentation

## 2021-01-02 DIAGNOSIS — C773 Secondary and unspecified malignant neoplasm of axilla and upper limb lymph nodes: Secondary | ICD-10-CM | POA: Insufficient documentation

## 2021-01-02 DIAGNOSIS — Z833 Family history of diabetes mellitus: Secondary | ICD-10-CM | POA: Diagnosis not present

## 2021-01-02 DIAGNOSIS — Z170 Estrogen receptor positive status [ER+]: Secondary | ICD-10-CM

## 2021-01-02 DIAGNOSIS — Z8249 Family history of ischemic heart disease and other diseases of the circulatory system: Secondary | ICD-10-CM | POA: Insufficient documentation

## 2021-01-02 DIAGNOSIS — Z8043 Family history of malignant neoplasm of testis: Secondary | ICD-10-CM | POA: Diagnosis not present

## 2021-01-02 DIAGNOSIS — Z79899 Other long term (current) drug therapy: Secondary | ICD-10-CM | POA: Insufficient documentation

## 2021-01-02 DIAGNOSIS — C50912 Malignant neoplasm of unspecified site of left female breast: Secondary | ICD-10-CM | POA: Diagnosis not present

## 2021-01-02 DIAGNOSIS — Z79811 Long term (current) use of aromatase inhibitors: Secondary | ICD-10-CM | POA: Insufficient documentation

## 2021-01-02 DIAGNOSIS — E119 Type 2 diabetes mellitus without complications: Secondary | ICD-10-CM | POA: Diagnosis not present

## 2021-01-02 DIAGNOSIS — Z08 Encounter for follow-up examination after completed treatment for malignant neoplasm: Secondary | ICD-10-CM | POA: Diagnosis not present

## 2021-01-02 DIAGNOSIS — Z801 Family history of malignant neoplasm of trachea, bronchus and lung: Secondary | ICD-10-CM | POA: Diagnosis not present

## 2021-01-02 LAB — COMPREHENSIVE METABOLIC PANEL
ALT: 22 U/L (ref 0–44)
AST: 21 U/L (ref 15–41)
Albumin: 4 g/dL (ref 3.5–5.0)
Alkaline Phosphatase: 80 U/L (ref 38–126)
Anion gap: 13 (ref 5–15)
BUN: 55 mg/dL — ABNORMAL HIGH (ref 8–23)
CO2: 24 mmol/L (ref 22–32)
Calcium: 9.2 mg/dL (ref 8.9–10.3)
Chloride: 99 mmol/L (ref 98–111)
Creatinine, Ser: 2.07 mg/dL — ABNORMAL HIGH (ref 0.44–1.00)
GFR, Estimated: 26 mL/min — ABNORMAL LOW (ref >60)
Glucose, Bld: 179 mg/dL — ABNORMAL HIGH (ref 70–99)
Potassium: 4.1 mmol/L (ref 3.5–5.1)
Sodium: 136 mmol/L (ref 135–145)
Total Bilirubin: 0.7 mg/dL (ref 0.3–1.2)
Total Protein: 7.4 g/dL (ref 6.5–8.1)

## 2021-01-02 LAB — CBC WITH DIFFERENTIAL/PLATELET
Abs Immature Granulocytes: 0.03 10*3/uL (ref 0.00–0.07)
Basophils Absolute: 0 10*3/uL (ref 0.0–0.1)
Basophils Relative: 1 %
Eosinophils Absolute: 0.2 10*3/uL (ref 0.0–0.5)
Eosinophils Relative: 3 %
HCT: 39.7 % (ref 36.0–46.0)
Hemoglobin: 13.1 g/dL (ref 12.0–15.0)
Immature Granulocytes: 1 %
Lymphocytes Relative: 16 %
Lymphs Abs: 1 10*3/uL (ref 0.7–4.0)
MCH: 28.3 pg (ref 26.0–34.0)
MCHC: 33 g/dL (ref 30.0–36.0)
MCV: 85.7 fL (ref 80.0–100.0)
Monocytes Absolute: 0.5 10*3/uL (ref 0.1–1.0)
Monocytes Relative: 9 %
Neutro Abs: 4.2 10*3/uL (ref 1.7–7.7)
Neutrophils Relative %: 70 %
Platelets: 219 10*3/uL (ref 150–400)
RBC: 4.63 MIL/uL (ref 3.87–5.11)
RDW: 16 % — ABNORMAL HIGH (ref 11.5–15.5)
WBC: 5.9 10*3/uL (ref 4.0–10.5)
nRBC: 0 % (ref 0.0–0.2)

## 2021-01-02 MED ORDER — HEPARIN SOD (PORK) LOCK FLUSH 100 UNIT/ML IV SOLN
INTRAVENOUS | Status: AC
  Filled 2021-01-02: qty 5

## 2021-01-02 MED ORDER — SODIUM CHLORIDE 0.9% FLUSH
10.0000 mL | Freq: Once | INTRAVENOUS | Status: AC
  Administered 2021-01-02: 10 mL via INTRAVENOUS
  Filled 2021-01-02: qty 10

## 2021-01-02 MED ORDER — SODIUM CHLORIDE 0.9 % IV SOLN
Freq: Once | INTRAVENOUS | Status: AC
  Filled 2021-01-02: qty 250

## 2021-01-02 MED ORDER — ZOLEDRONIC ACID 4 MG/5ML IV CONC
3.0000 mg | INTRAVENOUS | Status: DC
  Administered 2021-01-02: 3 mg via INTRAVENOUS
  Filled 2021-01-02: qty 3.75

## 2021-01-02 MED ORDER — HEPARIN SOD (PORK) LOCK FLUSH 100 UNIT/ML IV SOLN
500.0000 [IU] | Freq: Once | INTRAVENOUS | Status: AC | PRN
  Administered 2021-01-02: 500 [IU]
  Filled 2021-01-02: qty 5

## 2021-01-02 NOTE — Progress Notes (Signed)
Creatinine 2.07, Per Dr. Janese Banks and Elmon Else [facility] okay to proceed with 3 mg Zometa treatment at this time.

## 2021-01-02 NOTE — Progress Notes (Signed)
Hematology/Oncology Consult note [facility]  [phone] [phone]  Patient Care Team: [name], MD as PCP - General (Internal Medicine) Minus Breeding, MD as PCP - Cardiology (Cardiology) Jacelyn Pi, MD as Referring Physician (Endocrinology) Lahoma Rocker, MD as Consulting Physician (Rheumatology) Noreene Filbert, MD as Referring Physician (Radiation Oncology) [name], MD as Consulting Physician (Oncology) Jovita Kussmaul, MD as Consulting Physician (General Surgery) Rico Junker, RN as Registered Nurse Theodore Demark, RN as Registered Nurse Lavonna Monarch, MD as Consulting Physician (Dermatology)   Name of the patient: [name]  [id number]  02/19/1956   Date of visit: 01/02/21  Diagnosis- Invasive lobular carcinoma of theleftbreast pathological prognostic stage Ib pT2 pN2 acM0 ER/PR positive HER-2/neu negative s/p mastectomy and targeted node dissection  Chief complaint/ Reason for visit- Routine follow-up of breast cancer on Aromasin and to receive Zometa  Heme/Onc history: Patient is a 65 year old female with a past medical history significant for hypertension, CKD, diabetes, nonischemic cardiomyopathy among other medical problems. She has not undergone a mammogram for several years. Mammogram on October 2020 was done after she had a palpable area of concern in the left breast which showed an irregular mass with increased vascularity measuring 3.1 x 2.4 x 4.5 cm. The nodularity extended towards the nipple measuring at least 5.4 cm. Single abnormal lymph node in the left axilla with cortical thickness of 0.7 cm. 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  PET scan showed hypermetabolic left axillary lymph nodes which was reviewed at tumor board and they  wereat least found to be 3-4. Retroareolar left breast mass 2.5 x 3.2 cm in size. No evidence of distant metastatic disease. There were small to borderline enlarged retroperitoneal lymph nodes 10 mm in size with an SUV of 4.3.Retroperitoneal lymph nodes will require follow-up in the future  MRI showed 2 sites of biopsy-proven malignancy in the lateral left breast at posterior and anterior depth. There is non-mass enhancement spanning between the 2 sites. Morphologically abnormal level 1 axillary lymph node consistent with biopsy-proven metastatic disease.  Final pathology showed 2 separate tumors one which was 4.5 cm and the other one was 4.4 cm in size. Grade 2 ER/PR positive HER-2/neu negative with negative margins. 5 out of 11 lymph nodes were positive for malignancy. Extranodal extension present. Largest size of metastatic deposit 14 mm. mpT2 mpN2  Patient is not a candidate for anthracycline-based chemotherapy per cardiology. Adjuvant TC chemotherapycompleted on 03/11/2020.She completed adjuvant radiation therapy as well and started Arimidex which she could not tolerate and was to Aromasin on 06/19/2020   Interval history-patient reports doing well and denies any breast concerns at this time.  Appetite and weight have remained stable.  She is not vaccinated against COVID and does not wish to pursue that.  ECOG PS- 1 Pain scale- 0   Review of systems- Review of Systems  Constitutional: Positive for malaise/fatigue. Negative for chills, fever and weight loss.  HENT: Negative for congestion, ear discharge and nosebleeds.   Eyes: Negative for blurred vision.  Respiratory: Negative for cough, hemoptysis, sputum production, shortness of breath and wheezing.   Cardiovascular: Negative for chest pain, palpitations, orthopnea and claudication.  Gastrointestinal: Negative for abdominal pain, blood in stool, constipation, diarrhea, heartburn, melena, nausea and vomiting.   Genitourinary: Negative for dysuria, flank pain, frequency,  hematuria and urgency.  Musculoskeletal: Negative for back pain, joint pain and myalgias.  Skin: Negative for rash.  Neurological: Negative for dizziness, tingling, focal weakness, seizures, weakness and headaches.  Endo/Heme/Allergies: Does not bruise/bleed easily.  Psychiatric/Behavioral: Negative for depression and suicidal ideas. The patient does not have insomnia.       Allergies  Allergen Reactions  . Hydralazine Swelling    Eye Swelling  . Other Other (See Comments)    Unknown blood pressure medication caused face swelling     Past Medical History:  Diagnosis Date  . Arthritis of both feet    Gout  . Cancer [facility])    Breast Cancer on left  . Chronic combined systolic and diastolic CHF (congestive heart failure) ([place])    a. Echo (06/2014): Severe LVH, EF 20-25%, no RWMA, Gr 3 DD, trivial MR, mild LAE, mild RVE, PASP 38 mmHg .>> b. Echo 2/16 EF 50-55% //  // c. Echo 02/2019: EF 50-55, mod LVH, Gr 1 DD, mild LAE // Echocardiogram 02/2020: EF 60-65, no RWMA, mild LVH, Gr 1 DD, normal RVSF, RVSP 30.5   . CKD (chronic kidney disease), stage III ([place])    now stage 4  . Diabetes mellitus type 2 in obese ([place])   . Diverticular disease   . Dyspnea   . Family history of breast cancer   . Family history of leukemia   . Family history of lung cancer   . Family history of testicular cancer   . Family history of throat cancer   . Fatty liver   . History of GI diverticular bleed   . Hyperlipidemia   . Hyperphosphatemia   . Hypertension   . Hypertensive heart disease    a. Renal Artery Duplex (8/15):  no RAS  . Iron deficiency anemia   . NICM (nonischemic cardiomyopathy) ([place])    a. Nuclear (06/2014): EF 32%, apical thinning, no definite scar or ischemia;  b. Echo (2/16):  Mild LVH, EF 50-55%, Gr 2 DD, no RWMA  . Obesity   . Personal history of chemotherapy   . Personal history of radiation therapy   . PONV  (postoperative nausea and vomiting)    after D and C  . Proteinuria   . Psoriasis   . PUD (peptic ulcer disease)   . Secondary hyperparathyroidism, renal ([place])   . Tobacco abuse   . UTI (lower urinary tract infection)   . Vaginal dryness, menopausal   . Vaginal yeast infection      Past Surgical History:  Procedure Laterality Date  . BREAST BIOPSY Left 2020   positive   . COLONOSCOPY    . DILATION AND CURETTAGE OF UTERUS    . MASTECTOMY Left 2020  . MASTECTOMY W/ SENTINEL NODE BIOPSY Left 11/12/2019  . MASTECTOMY W/ SENTINEL NODE BIOPSY Left 11/12/2019   Procedure: LEFT MASTECTOMY WITH SENTINEL LYMPH NODE BIOPSY AND TARGETED NODE DISSECTION;  Surgeon: Jovita Kussmaul, MD;  Location: [facility];  Service: General;  Laterality: Left;  . None    . PORTA CATH INSERTION N/A 12/11/2019   Procedure: PORTA CATH INSERTION;  Surgeon: Katha Cabal, MD;  Location: [facility];  Service: Cardiovascular;  Laterality: N/A;    Social History   Socioeconomic History  . Marital status: Widowed    Spouse name: Not on file  . Number of children: 2  . Years of education: 7  . Highest education level: Not on file  Occupational History  . Not on file  Tobacco Use  . Smoking status: Former Smoker    Quit date: 07/05/2014    Years since quitting: 6.5  . Smokeless tobacco: Never Used  Vaping Use  . Vaping Use: Never used  Substance and Sexual Activity  . Alcohol use: No  . Drug use: No  . Sexual activity: Not Currently  Other Topics Concern  . Not on file  Social History Narrative   Currently lives in a house with her husband.    Fun: Watch TV.   Denies religious beliefs effecting health care.    Social Determinants of Health   Financial Resource Strain: Not on file  Food Insecurity: Not on file  Transportation Needs: Not on file  Physical Activity: Not on file  Stress: Not on file  Social Connections: Not on file  Intimate Partner Violence: Not on file    Family  History  Problem Relation Age of Onset  . Heart attack Paternal Grandfather   . Stroke Paternal Grandmother   . Hypertension Paternal Grandmother   . Kidney disease Mother   . Diabetes Mother   . Testicular cancer Father        diagnosed older than 93  . Coronary artery disease Other   . Hypertension Other   . Breast cancer Sister        diagnosed late 57s, negative genetics  . Leukemia Brother        chronic  . Throat cancer Brother   . Pneumonia Sister 2  . Lung cancer Paternal Uncle        diagnosed over 53     Current Outpatient Medications:  .  acetaminophen (TYLENOL) 500 MG tablet, Take 1,000 mg by mouth every 6 (six) hours as needed for mild pain, moderate pain or headache. For pain, Disp: , Rfl:  .  allopurinol (ZYLOPRIM) 100 MG tablet, Take 200 mg by mouth daily., Disp: , Rfl:  .  amLODipine-olmesartan (AZOR) 5-20 MG tablet, Take 1 tablet by mouth daily., Disp: , Rfl:  .  aspirin EC 81 MG EC tablet, Take 1 tablet (81 mg total) by mouth daily., Disp: , Rfl:  .  atorvastatin (LIPITOR) 40 MG tablet, Take 1 tablet (40 mg total) by mouth daily at 6 PM. Patient needs appointment for future refills.  Please call [phone]. 1st attempt., Disp: 90 tablet, Rfl: 1 .  augmented betamethasone dipropionate (DIPROLENE-AF) 0.05 % cream, APPLY TO AFFECTED AREA TWICE A DAY, Disp: 50 g, Rfl: 2 .  B-D ULTRAFINE III SHORT PEN 31G X 8 MM MISC, USE AS DIRECTED *EMERGENCY FILL*, Disp: , Rfl: 0 .  betamethasone dipropionate 0.05 % cream, Apply topically 2 (two) times daily., Disp: 30 g, Rfl: 0 .  blood glucose meter kit and supplies KIT, Dispense based on patient and insurance preference. Use up to four times daily as directed. (FOR ICD-9 250.00, 250.01)., Disp: 1 each, Rfl: 0 .  calcipotriene (DOVONOX) 0.005 % cream, Apply topically 2 (two) times daily., Disp: 120 g, Rfl: 2 .  calcitRIOL (ROCALTROL) 0.25 MCG capsule, Take by mouth., Disp: , Rfl:  .  Calcium Carb-Cholecalciferol (CALCIUM  CARBONATE-VITAMIN D3) 600-400 MG-UNIT TABS, Take 1 tablet by mouth in the morning and at bedtime., Disp: , Rfl:  .  calcium carbonate (OSCAL) 1500 (600 Ca) MG TABS tablet, Take 1 tablet by mouth 2 (two) times daily with a meal., Disp: , Rfl:  .  cloNIDine (CATAPRES) 0.1 MG tablet, Take 1 tablet by mouth daily., Disp: , Rfl:  .  Colchicine 0.6  MG CAPS, Take 0.6 mg by mouth every other day. , Disp: , Rfl:  .  exemestane (AROMASIN) 25 MG tablet, TAKE 1 TABLET (25 MG TOTAL) BY MOUTH DAILY AFTER BREAKFAST., Disp: 90 tablet, Rfl: 1 .  furosemide (LASIX) 20 MG tablet, Take 40 mg by mouth 2 (two) times daily., Disp: , Rfl:  .  gabapentin (NEURONTIN) 100 MG capsule, Take 2 capsules (200 mg total) by mouth 3 (three) times daily., Disp: 180 capsule, Rfl: 1 .  insulin aspart (NOVOLOG FLEXPEN) 100 UNIT/ML FlexPen, Inject 5 Units into the skin 3 (three) times daily with meals. Additional insulin (5 units PLUS) CBG 70 - 120: 0 units CBG 121 - 150: 1 unit CBG 151 - 200: 2 units CBG 201 - 250: 3 units CBG 251 - 350: 4 units CBG 351 - 400: 6 units, Disp: 15 mL, Rfl: 11 .  insulin glargine (LANTUS) 100 UNIT/ML injection, Inject 20 Units into the skin at bedtime. , Disp: , Rfl:  .  isosorbide mononitrate (IMDUR) 60 MG 24 hr tablet, TAKE 1 TABLET (60 MG TOTAL) BY MOUTH DAILY. PLEASE MAKE YEARLY APPT WITH DR. [facility] FOR JANUARY 2022 FOR FUTURE REFILLS. THANK YOU 1ST ATTEMPT, Disp: 30 tablet, Rfl: 0 .  lidocaine-prilocaine (EMLA) cream, Apply to affected area once, Disp: 30 g, Rfl: 3 .  LORazepam (ATIVAN) 0.5 MG tablet, Take 1 tablet (0.5 mg total) by mouth every 6 (six) hours as needed (Nausea or vomiting)., Disp: 30 tablet, Rfl: 0 .  metoprolol tartrate (LOPRESSOR) 25 MG tablet, TAKE 1 TABLET BY MOUTH TWICE A DAY, Disp: 180 tablet, Rfl: 3 .  ONE TOUCH ULTRA TEST test strip, 1 each by Other route 4 (four) times daily. , Disp: , Rfl: 1 .  ONETOUCH DELICA LANCETS 26V MISC, Inject 33 g into the skin 4 (four) times daily. ,  Disp: , Rfl: 1 .  polyethylene glycol (MIRALAX / GLYCOLAX) 17 g packet, Take 17 g by mouth daily as needed for moderate constipation., Disp: , Rfl:  .  telmisartan (MICARDIS) 20 MG tablet, TAKE 1 TABLET BY MOUTH EVERY DAY, Disp: 90 tablet, Rfl: 3 No current facility-administered medications for this visit.  Facility-Administered Medications Ordered in Other Visits:  .  zolendronic acid (ZOMETA) 3 mg in sodium chloride 0.9 % 100 mL IVPB, 3 mg, Intravenous, Q6 months, [name], MD, Stopped at 01/02/21 1125  Physical exam:  Vitals:   01/02/21 1002  BP: 121/77  Pulse: 74  Resp: 18  Temp: 98 F (36.7 C)  TempSrc: Tympanic  SpO2: 100%  Weight: 195 lb 14.4 oz (88.9 kg)   Physical Exam Constitutional:      General: She is not in acute distress.    Comments: Ambulates with a cane  Eyes:     Extraocular Movements: EOM normal.  Cardiovascular:     Rate and Rhythm: Normal rate and regular rhythm.     Heart sounds: Normal heart sounds.  Pulmonary:     Effort: Pulmonary effort is normal.     Breath sounds: Normal breath sounds.  Abdominal:     General: Bowel sounds are normal.     Palpations: Abdomen is soft.  Skin:    General: Skin is warm and dry.  Neurological:     Mental Status: She is alert and oriented to person, place, and time.    breast: Patient is s/p left mastectomy without reconstruction with no evidence of chest wall recurrence.  No palpable left axillary adenopathy.  No palpable masses  in the right breast.  No palpable right axillary adenopathy.  CMP Latest Ref Rng & Units 01/02/2021  Glucose 70 - 99 mg/dL 179(H)  BUN 8 - 23 mg/dL 55(H)  Creatinine 0.44 - 1.00 mg/dL 2.07(H)  Sodium 135 - 145 mmol/L 136  Potassium 3.5 - 5.1 mmol/L 4.1  Chloride 98 - 111 mmol/L 99  CO2 22 - 32 mmol/L 24  Calcium 8.9 - 10.3 mg/dL 9.2  Total Protein 6.5 - 8.1 g/dL 7.4  Total Bilirubin 0.3 - 1.2 mg/dL 0.7  Alkaline Phos 38 - 126 U/L 80  AST 15 - 41 U/L 21  ALT 0 - 44 U/L 22    CBC Latest Ref Rng & Units 01/02/2021  WBC 4.0 - 10.5 K/uL 5.9  Hemoglobin 12.0 - 15.0 g/dL 13.1  Hematocrit 36.0 - 46.0 % 39.7  Platelets 150 - 400 K/uL 219     Assessment and plan- Patient is a 65 y.o. female with pathological prognostic stage Ib invasive lobular carcinoma pT2 pN2 acM0 ER/PR positive HER-2/neu negative s/pleft mastectomy and targeted node dissection.She has completed 4 cycles of adjuvant TC chemotherapyAnd adjuvant radiation therapy.    She is currently on Aromasin and this is a routine follow-up visit  Patient is tolerating Aromasin well without any significant side effects.  She will continue to take calcium and vitamin D along with it.  Recent mammogram in October 2021 showed no evidence of malignancy in the right side.    Zometa today which she will receive at a reduced dose of 3 mg given her CKD.  I will see her back in 6 months with CBC with differential and CMP for the next dose of Zometa   Visit Diagnosis 1. Encounter for follow-up surveillance of breast cancer   2. Use of exemestane (Aromasin)   3. Long term (current) use of bisphosphonates      Dr. Randa Evens, MD, MPH [facility] at [facility] [phone] 01/02/2021 12:59 PM

## 2021-01-03 ENCOUNTER — Other Ambulatory Visit: Payer: Self-pay

## 2021-01-05 MED ORDER — ISOSORBIDE MONONITRATE ER 60 MG PO TB24
60.0000 mg | ORAL_TABLET | Freq: Every day | ORAL | 3 refills | Status: DC
Start: 2021-01-05 — End: 2021-12-07

## 2021-01-05 NOTE — Telephone Encounter (Signed)
Pt's medication was already sent to pt's pharmacy as requested. Aspirin is a OTC medication. Confirmation received.

## 2021-01-06 MED ORDER — ASPIRIN 81 MG PO TBEC: 81.0000 mg | DELAYED_RELEASE_TABLET | Freq: Every day | ORAL | Status: AC

## 2021-01-29 IMAGING — MG DIGITAL SCREENING UNILAT RIGHT W/ TOMO W/ CAD
4 series · 4 of 12 positions shown · non-contrast
Comparison: Previous exam(s).

CLINICAL DATA: Screening.

EXAM:
DIGITAL SCREENING UNILATERAL RIGHT MAMMOGRAM WITH CAD AND TOMO

[R MLO synth-2D]
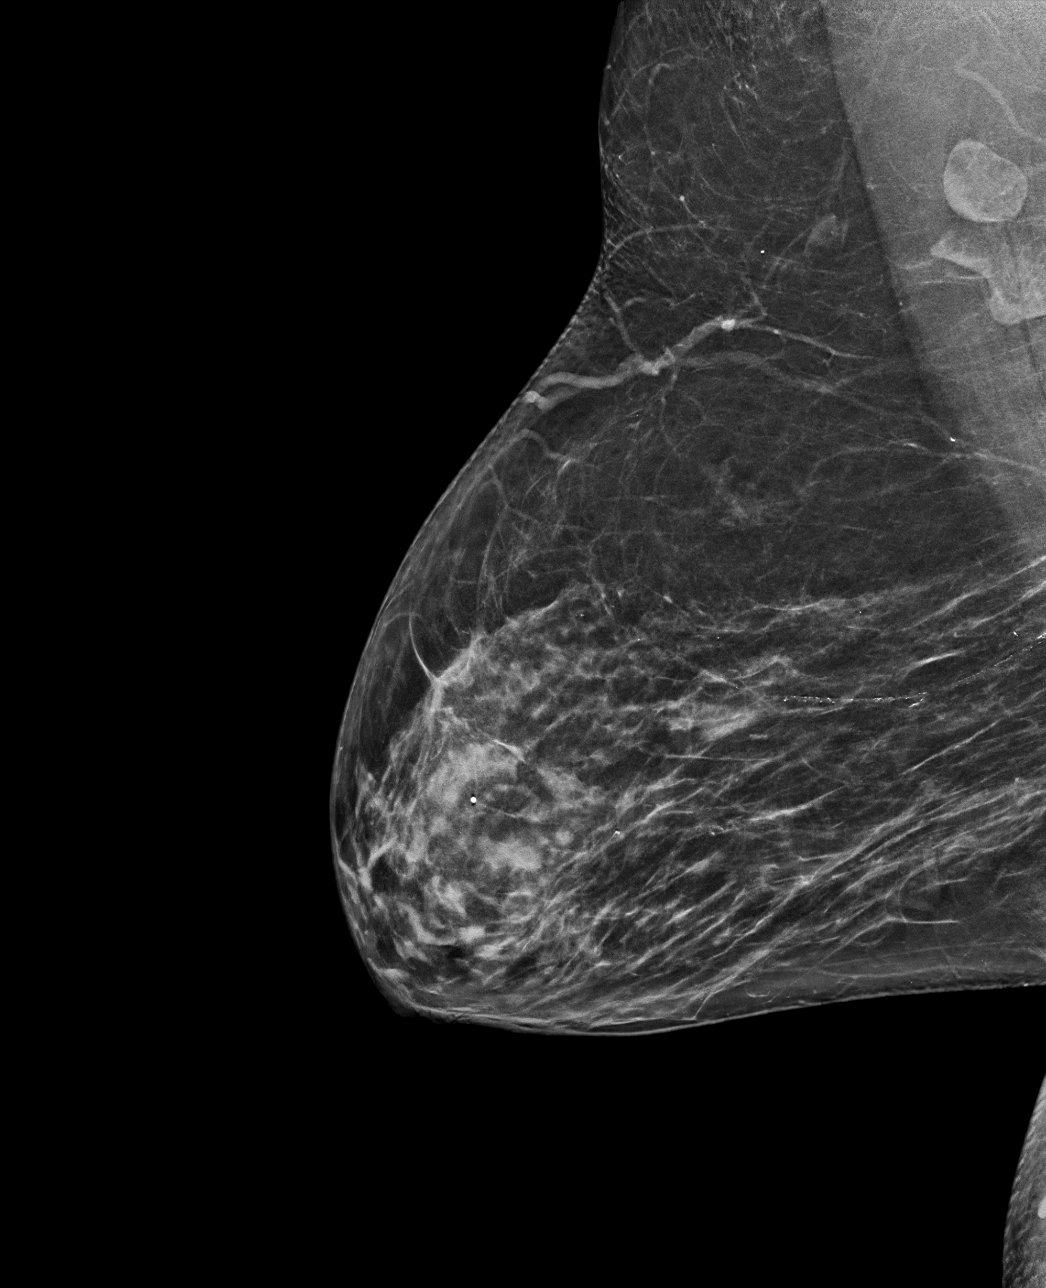

[R CC synth-2D]
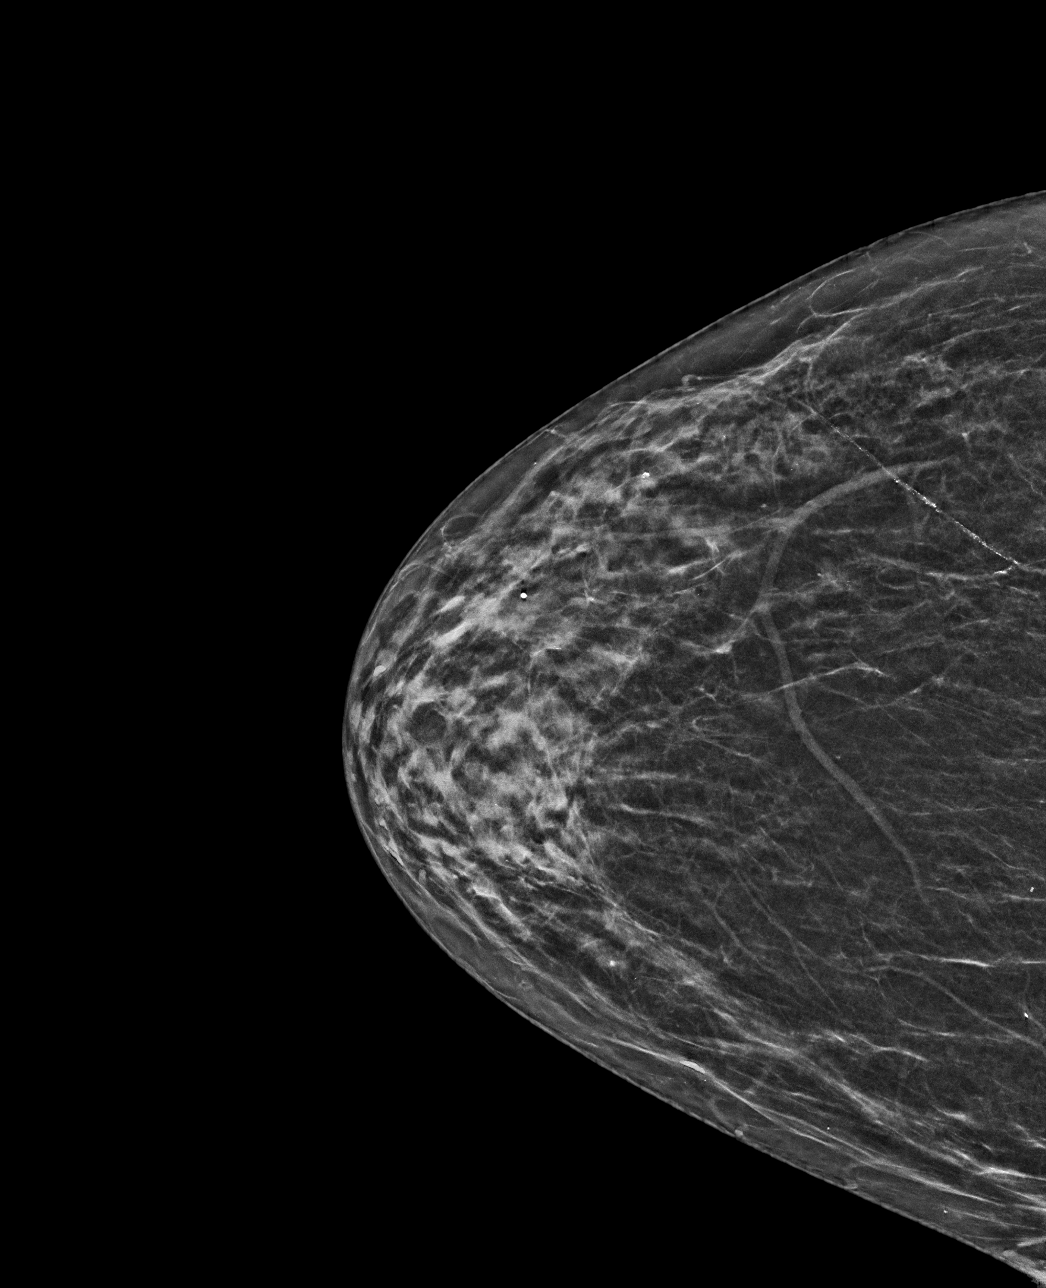

[R MLO tomo · tomo slice 34/67.0]
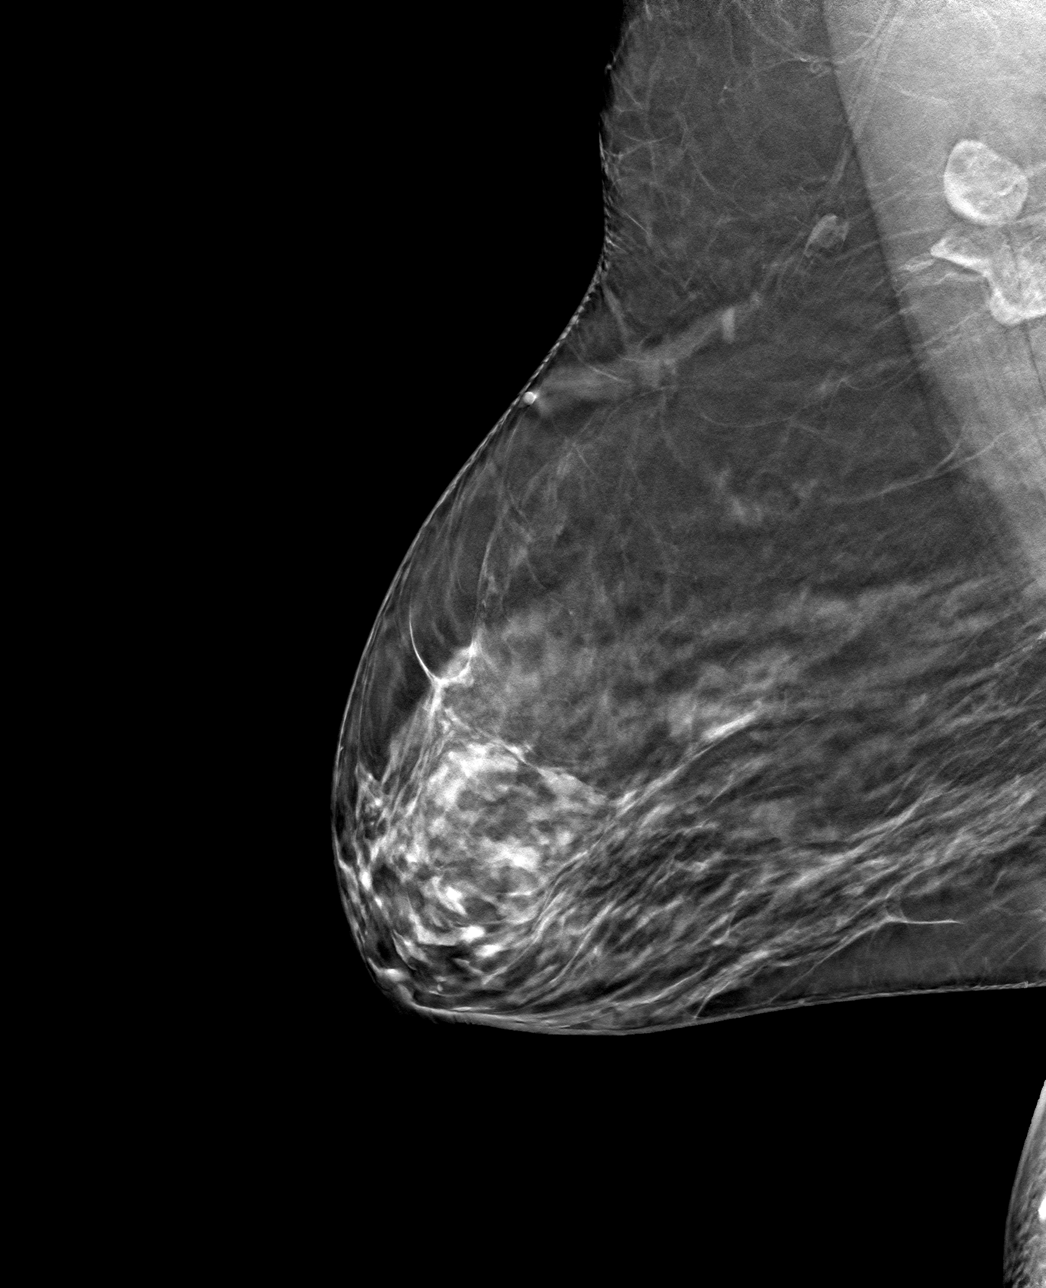

[R CC tomo · tomo slice 25/50.0]
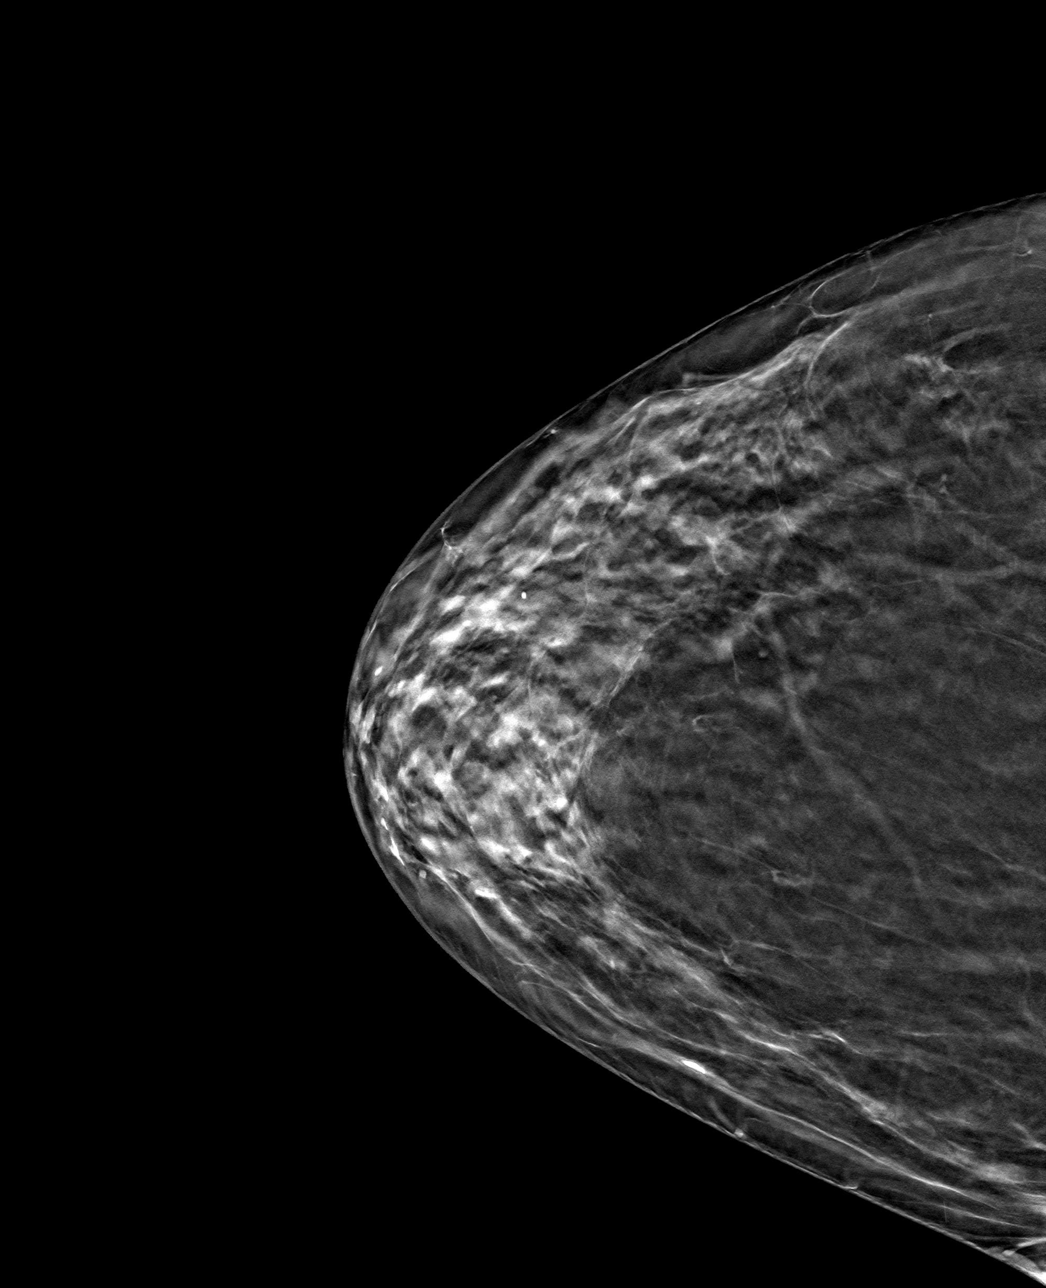

[4 of 12 positions shown; findings below may reference images not displayed]

ACR Breast Density Category c: The breast tissue is heterogeneously
dense, which may obscure small masses.
FINDINGS: The patient has had a left mastectomy. There are no findings
suspicious for malignancy. Images were processed with CAD.
IMPRESSION: No mammographic evidence of malignancy. A result letter of this
screening mammogram will be mailed directly to the patient.

RECOMMENDATION:
Screening mammogram in one year.  (Code:CY-P-D4Y)

BI-RADS CATEGORY  1: Negative.

## 2021-02-01 ENCOUNTER — Other Ambulatory Visit: Payer: Self-pay | Admitting: Physician Assistant

## 2021-02-09 ENCOUNTER — Ambulatory Visit: Payer: PPO | Admitting: Dermatology

## 2021-03-03 DIAGNOSIS — I428 Other cardiomyopathies: Secondary | ICD-10-CM | POA: Diagnosis not present

## 2021-03-03 DIAGNOSIS — M109 Gout, unspecified: Secondary | ICD-10-CM | POA: Diagnosis not present

## 2021-03-03 DIAGNOSIS — C50912 Malignant neoplasm of unspecified site of left female breast: Secondary | ICD-10-CM | POA: Diagnosis not present

## 2021-03-03 DIAGNOSIS — I129 Hypertensive chronic kidney disease with stage 1 through stage 4 chronic kidney disease, or unspecified chronic kidney disease: Secondary | ICD-10-CM | POA: Diagnosis not present

## 2021-03-03 DIAGNOSIS — E1122 Type 2 diabetes mellitus with diabetic chronic kidney disease: Secondary | ICD-10-CM | POA: Diagnosis not present

## 2021-03-03 DIAGNOSIS — D649 Anemia, unspecified: Secondary | ICD-10-CM | POA: Diagnosis not present

## 2021-03-03 DIAGNOSIS — N2581 Secondary hyperparathyroidism of renal origin: Secondary | ICD-10-CM | POA: Diagnosis not present

## 2021-03-03 DIAGNOSIS — N183 Chronic kidney disease, stage 3 unspecified: Secondary | ICD-10-CM | POA: Diagnosis not present

## 2021-03-07 ENCOUNTER — Other Ambulatory Visit: Payer: Self-pay | Admitting: Oncology

## 2021-03-29 ENCOUNTER — Other Ambulatory Visit: Payer: Self-pay | Admitting: Physician Assistant

## 2021-04-02 ENCOUNTER — Inpatient Hospital Stay: Payer: PPO | Attending: Oncology

## 2021-04-02 ENCOUNTER — Other Ambulatory Visit: Payer: Self-pay

## 2021-04-02 DIAGNOSIS — Z170 Estrogen receptor positive status [ER+]: Secondary | ICD-10-CM | POA: Diagnosis not present

## 2021-04-02 DIAGNOSIS — C50912 Malignant neoplasm of unspecified site of left female breast: Secondary | ICD-10-CM | POA: Insufficient documentation

## 2021-04-02 DIAGNOSIS — Z452 Encounter for adjustment and management of vascular access device: Secondary | ICD-10-CM | POA: Diagnosis not present

## 2021-04-02 DIAGNOSIS — Z08 Encounter for follow-up examination after completed treatment for malignant neoplasm: Secondary | ICD-10-CM

## 2021-04-02 DIAGNOSIS — Z95828 Presence of other vascular implants and grafts: Secondary | ICD-10-CM

## 2021-04-02 DIAGNOSIS — Z79811 Long term (current) use of aromatase inhibitors: Secondary | ICD-10-CM

## 2021-04-02 MED ORDER — SODIUM CHLORIDE 0.9% FLUSH
10.0000 mL | INTRAVENOUS | Status: DC | PRN
  Administered 2021-04-02: 10 mL via INTRAVENOUS
  Filled 2021-04-02: qty 10

## 2021-04-02 MED ORDER — HEPARIN SOD (PORK) LOCK FLUSH 100 UNIT/ML IV SOLN
500.0000 [IU] | Freq: Once | INTRAVENOUS | Status: AC
  Administered 2021-04-02: 500 [IU] via INTRAVENOUS
  Filled 2021-04-02: qty 5

## 2021-04-02 MED ORDER — HEPARIN SOD (PORK) LOCK FLUSH 100 UNIT/ML IV SOLN
INTRAVENOUS | Status: AC
  Filled 2021-04-02: qty 5

## 2021-05-17 ENCOUNTER — Other Ambulatory Visit: Payer: Self-pay | Admitting: Dermatology

## 2021-05-23 ENCOUNTER — Other Ambulatory Visit: Payer: Self-pay | Admitting: Dermatology

## 2021-05-23 ENCOUNTER — Other Ambulatory Visit: Payer: Self-pay | Admitting: Oncology

## 2021-05-23 DIAGNOSIS — L409 Psoriasis, unspecified: Secondary | ICD-10-CM

## 2021-05-25 ENCOUNTER — Encounter: Payer: Self-pay | Admitting: Oncology

## 2021-06-18 ENCOUNTER — Ambulatory Visit
Admission: RE | Admit: 2021-06-18 | Discharge: 2021-06-18 | Disposition: A | Discharge status: Home / Self Care | Payer: PPO | Source: Ambulatory Visit | Attending: Radiation Oncology | Admitting: Radiation Oncology

## 2021-06-18 ENCOUNTER — Other Ambulatory Visit: Payer: Self-pay | Admitting: *Deleted

## 2021-06-18 ENCOUNTER — Ambulatory Visit
Admission: RE | Admit: 2021-06-18 | Discharge: 2021-06-18 | Disposition: A | Discharge status: Home / Self Care | Payer: PPO | Source: Ambulatory Visit | Attending: *Deleted | Admitting: *Deleted

## 2021-06-18 ENCOUNTER — Other Ambulatory Visit: Payer: Self-pay

## 2021-06-18 ENCOUNTER — Encounter: Payer: Self-pay | Admitting: Radiation Oncology

## 2021-06-18 VITALS — BP 127/83 | HR 65 | Temp 96.8°F

## 2021-06-18 DIAGNOSIS — Z923 Personal history of irradiation: Secondary | ICD-10-CM | POA: Diagnosis not present

## 2021-06-18 DIAGNOSIS — Z9012 Acquired absence of left breast and nipple: Secondary | ICD-10-CM | POA: Insufficient documentation

## 2021-06-18 DIAGNOSIS — C50812 Malignant neoplasm of overlapping sites of left female breast: Secondary | ICD-10-CM | POA: Insufficient documentation

## 2021-06-18 DIAGNOSIS — Z79811 Long term (current) use of aromatase inhibitors: Secondary | ICD-10-CM | POA: Insufficient documentation

## 2021-06-18 DIAGNOSIS — Z170 Estrogen receptor positive status [ER+]: Secondary | ICD-10-CM

## 2021-06-18 DIAGNOSIS — M7918 Myalgia, other site: Secondary | ICD-10-CM | POA: Insufficient documentation

## 2021-06-18 DIAGNOSIS — M79661 Pain in right lower leg: Secondary | ICD-10-CM | POA: Diagnosis not present

## 2021-06-18 DIAGNOSIS — R600 Localized edema: Secondary | ICD-10-CM | POA: Diagnosis not present

## 2021-06-18 DIAGNOSIS — Z08 Encounter for follow-up examination after completed treatment for malignant neoplasm: Secondary | ICD-10-CM | POA: Diagnosis not present

## 2021-06-18 NOTE — Progress Notes (Signed)
Radiation Oncology Follow up Note  Name: [name]   Date:   06/18/2021 MRN:  [id number] DOB: 1956-02-18    This 65 y.o. female presents to the clinic today for 1 year follow-up status post radiation therapy to her left chest wall and peripheral lymphatics and patient with stage Ib (T2 N2 M0) ER/PR positive HER2 HER2 negative invasive lobular carcinoma status post modified radical mastectomy.  REFERRING PROVIDER: [name], MD  HPI: Patient is a 65 year old female now out 1 year having completed left chest wall and peripheral emphatic radiation for stage Ib ER/PR positive invasive lobular carcinoma.  Of the left breast status post left modified radical mastectomy seen today in routine follow-up her major concern is recent onset of right calf pain making her unable to walk.  She specifically denies breast tenderness cough or bone pain.  She is currently on Aromasin tolerating it well without side effects.  I have ordered a plain film of her right tibia-fibula  COMPLICATIONS OF TREATMENT: none  FOLLOW UP COMPLIANCE: keeps appointments   PHYSICAL EXAM:  BP 127/83 (BP Location: Left Arm, Patient Position: Sitting)   Pulse 65   Temp (!) 96.8 F (36 C) (Tympanic)  Patient is status post left modified radical mastectomy chest wall is clear without evidence of mass or nodularity.  Right breast is free of dominant mass.  No axillary or supraclavicular adenopathy is appreciated.  She is having significant pain in her right calf for which I ordered x-ray.  Well-developed well-nourished patient in NAD. HEENT reveals PERLA, EOMI, discs not visualized.  Oral cavity is clear. No oral mucosal lesions are identified. Neck is clear without evidence of cervical or supraclavicular adenopathy. Lungs are clear to A&P. Cardiac examination is essentially unremarkable with regular rate and rhythm without murmur rub or thrill. Abdomen is benign with no organomegaly or masses noted. Motor sensory and DTR levels are  equal and symmetric in the upper and lower extremities. Cranial nerves II through XII are grossly intact. Proprioception is intact. No peripheral adenopathy or edema is identified. No motor or sensory levels are noted. Crude visual fields are within normal range.  RADIOLOGY RESULTS: Films to be reviewed  PLAN: Present time patient is doing well from a breast standpoint.  Should her plain films be normal on my review will have her see orthopedic surgery next week.  I have otherwise asked to see her back in 1 year for follow-up.  Patient knows to call with any concerns.  I would like to take this opportunity to thank you for allowing me to participate in the care of your patient.Noreene Filbert, MD

## 2021-06-23 ENCOUNTER — Telehealth: Payer: Self-pay | Admitting: Oncology

## 2021-06-23 DIAGNOSIS — E1140 Type 2 diabetes mellitus with diabetic neuropathy, unspecified: Secondary | ICD-10-CM | POA: Diagnosis not present

## 2021-06-23 DIAGNOSIS — S82839A Other fracture of upper and lower end of unspecified fibula, initial encounter for closed fracture: Secondary | ICD-10-CM | POA: Insufficient documentation

## 2021-06-23 DIAGNOSIS — M79604 Pain in right leg: Secondary | ICD-10-CM | POA: Diagnosis not present

## 2021-06-23 DIAGNOSIS — M13871 Other specified arthritis, right ankle and foot: Secondary | ICD-10-CM | POA: Diagnosis not present

## 2021-06-23 DIAGNOSIS — M19071 Primary osteoarthritis, right ankle and foot: Secondary | ICD-10-CM | POA: Insufficient documentation

## 2021-06-23 DIAGNOSIS — S82831A Other fracture of upper and lower end of right fibula, initial encounter for closed fracture: Secondary | ICD-10-CM | POA: Diagnosis not present

## 2021-06-23 NOTE — Telephone Encounter (Signed)
Left VM with patient to notify her of changes to schedule. Left direct extension for her to call back and confirm appt. Mailing updated AVS.

## 2021-07-02 ENCOUNTER — Ambulatory Visit: Payer: PPO | Admitting: Oncology

## 2021-07-02 ENCOUNTER — Other Ambulatory Visit: Payer: PPO

## 2021-07-02 ENCOUNTER — Ambulatory Visit: Payer: PPO

## 2021-07-06 DIAGNOSIS — S82401A Unspecified fracture of shaft of right fibula, initial encounter for closed fracture: Secondary | ICD-10-CM | POA: Insufficient documentation

## 2021-07-06 DIAGNOSIS — E1140 Type 2 diabetes mellitus with diabetic neuropathy, unspecified: Secondary | ICD-10-CM | POA: Diagnosis not present

## 2021-07-06 DIAGNOSIS — M12571 Traumatic arthropathy, right ankle and foot: Secondary | ICD-10-CM | POA: Diagnosis not present

## 2021-07-06 DIAGNOSIS — S82421S Displaced transverse fracture of shaft of right fibula, sequela: Secondary | ICD-10-CM | POA: Diagnosis not present

## 2021-07-06 DIAGNOSIS — M12579 Traumatic arthropathy, unspecified ankle and foot: Secondary | ICD-10-CM | POA: Insufficient documentation

## 2021-07-21 ENCOUNTER — Encounter: Payer: Self-pay | Admitting: Oncology

## 2021-07-21 ENCOUNTER — Inpatient Hospital Stay: Payer: PPO | Attending: Oncology

## 2021-07-21 ENCOUNTER — Inpatient Hospital Stay: Payer: PPO

## 2021-07-21 ENCOUNTER — Inpatient Hospital Stay (HOSPITAL_BASED_OUTPATIENT_CLINIC_OR_DEPARTMENT_OTHER): Payer: PPO | Admitting: Oncology

## 2021-07-21 VITALS — BP 122/82 | HR 66 | Temp 97.4°F | Resp 18 | Wt 194.4 lb

## 2021-07-21 DIAGNOSIS — E1122 Type 2 diabetes mellitus with diabetic chronic kidney disease: Secondary | ICD-10-CM | POA: Diagnosis not present

## 2021-07-21 DIAGNOSIS — Z170 Estrogen receptor positive status [ER+]: Secondary | ICD-10-CM | POA: Insufficient documentation

## 2021-07-21 DIAGNOSIS — Z794 Long term (current) use of insulin: Secondary | ICD-10-CM | POA: Diagnosis not present

## 2021-07-21 DIAGNOSIS — M8580 Other specified disorders of bone density and structure, unspecified site: Secondary | ICD-10-CM | POA: Insufficient documentation

## 2021-07-21 DIAGNOSIS — Z7983 Long term (current) use of bisphosphonates: Secondary | ICD-10-CM

## 2021-07-21 DIAGNOSIS — Z87891 Personal history of nicotine dependence: Secondary | ICD-10-CM | POA: Diagnosis not present

## 2021-07-21 DIAGNOSIS — E785 Hyperlipidemia, unspecified: Secondary | ICD-10-CM | POA: Diagnosis not present

## 2021-07-21 DIAGNOSIS — Z7982 Long term (current) use of aspirin: Secondary | ICD-10-CM | POA: Diagnosis not present

## 2021-07-21 DIAGNOSIS — C50912 Malignant neoplasm of unspecified site of left female breast: Secondary | ICD-10-CM | POA: Diagnosis not present

## 2021-07-21 DIAGNOSIS — Z9012 Acquired absence of left breast and nipple: Secondary | ICD-10-CM | POA: Diagnosis not present

## 2021-07-21 DIAGNOSIS — Z801 Family history of malignant neoplasm of trachea, bronchus and lung: Secondary | ICD-10-CM | POA: Diagnosis not present

## 2021-07-21 DIAGNOSIS — Z806 Family history of leukemia: Secondary | ICD-10-CM | POA: Diagnosis not present

## 2021-07-21 DIAGNOSIS — C773 Secondary and unspecified malignant neoplasm of axilla and upper limb lymph nodes: Secondary | ICD-10-CM | POA: Diagnosis not present

## 2021-07-21 DIAGNOSIS — N183 Chronic kidney disease, stage 3 unspecified: Secondary | ICD-10-CM | POA: Diagnosis not present

## 2021-07-21 DIAGNOSIS — Z853 Personal history of malignant neoplasm of breast: Secondary | ICD-10-CM

## 2021-07-21 DIAGNOSIS — C50812 Malignant neoplasm of overlapping sites of left female breast: Secondary | ICD-10-CM

## 2021-07-21 DIAGNOSIS — Z8249 Family history of ischemic heart disease and other diseases of the circulatory system: Secondary | ICD-10-CM | POA: Insufficient documentation

## 2021-07-21 DIAGNOSIS — Z79899 Other long term (current) drug therapy: Secondary | ICD-10-CM | POA: Diagnosis not present

## 2021-07-21 DIAGNOSIS — Z803 Family history of malignant neoplasm of breast: Secondary | ICD-10-CM | POA: Diagnosis not present

## 2021-07-21 DIAGNOSIS — Z08 Encounter for follow-up examination after completed treatment for malignant neoplasm: Secondary | ICD-10-CM

## 2021-07-21 DIAGNOSIS — I130 Hypertensive heart and chronic kidney disease with heart failure and stage 1 through stage 4 chronic kidney disease, or unspecified chronic kidney disease: Secondary | ICD-10-CM | POA: Diagnosis not present

## 2021-07-21 DIAGNOSIS — Z79811 Long term (current) use of aromatase inhibitors: Secondary | ICD-10-CM

## 2021-07-21 DIAGNOSIS — Z923 Personal history of irradiation: Secondary | ICD-10-CM | POA: Diagnosis not present

## 2021-07-21 DIAGNOSIS — Z9221 Personal history of antineoplastic chemotherapy: Secondary | ICD-10-CM | POA: Insufficient documentation

## 2021-07-21 DIAGNOSIS — I5042 Chronic combined systolic (congestive) and diastolic (congestive) heart failure: Secondary | ICD-10-CM | POA: Insufficient documentation

## 2021-07-21 LAB — COMPREHENSIVE METABOLIC PANEL
ALT: 25 U/L (ref 0–44)
AST: 21 U/L (ref 15–41)
Albumin: 4.1 g/dL (ref 3.5–5.0)
Alkaline Phosphatase: 92 U/L (ref 38–126)
Anion gap: 10 (ref 5–15)
BUN: 52 mg/dL — ABNORMAL HIGH (ref 8–23)
CO2: 29 mmol/L (ref 22–32)
Calcium: 9.8 mg/dL (ref 8.9–10.3)
Chloride: 97 mmol/L — ABNORMAL LOW (ref 98–111)
Creatinine, Ser: 2.32 mg/dL — ABNORMAL HIGH (ref 0.44–1.00)
GFR, Estimated: 23 mL/min — ABNORMAL LOW (ref >60)
Glucose, Bld: 161 mg/dL — ABNORMAL HIGH (ref 70–99)
Potassium: 3.8 mmol/L (ref 3.5–5.1)
Sodium: 136 mmol/L (ref 135–145)
Total Bilirubin: 0.8 mg/dL (ref 0.3–1.2)
Total Protein: 7.5 g/dL (ref 6.5–8.1)

## 2021-07-21 LAB — CBC WITH DIFFERENTIAL/PLATELET
Abs Immature Granulocytes: 0.03 10*3/uL (ref 0.00–0.07)
Basophils Absolute: 0.1 10*3/uL (ref 0.0–0.1)
Basophils Relative: 1 %
Eosinophils Absolute: 0.2 10*3/uL (ref 0.0–0.5)
Eosinophils Relative: 3 %
HCT: 42.5 % (ref 36.0–46.0)
Hemoglobin: 14 g/dL (ref 12.0–15.0)
Immature Granulocytes: 1 %
Lymphocytes Relative: 15 %
Lymphs Abs: 0.9 10*3/uL (ref 0.7–4.0)
MCH: 28.7 pg (ref 26.0–34.0)
MCHC: 32.9 g/dL (ref 30.0–36.0)
MCV: 87.3 fL (ref 80.0–100.0)
Monocytes Absolute: 0.6 10*3/uL (ref 0.1–1.0)
Monocytes Relative: 9 %
Neutro Abs: 4.3 10*3/uL (ref 1.7–7.7)
Neutrophils Relative %: 71 %
Platelets: 191 10*3/uL (ref 150–400)
RBC: 4.87 MIL/uL (ref 3.87–5.11)
RDW: 15.3 % (ref 11.5–15.5)
WBC: 6 10*3/uL (ref 4.0–10.5)
nRBC: 0 % (ref 0.0–0.2)

## 2021-07-21 MED ORDER — SODIUM CHLORIDE 0.9 % IV SOLN
Freq: Once | INTRAVENOUS | Status: AC
  Filled 2021-07-21: qty 250

## 2021-07-21 MED ORDER — HEPARIN SOD (PORK) LOCK FLUSH 100 UNIT/ML IV SOLN
500.0000 [IU] | Freq: Once | INTRAVENOUS | Status: AC | PRN
  Administered 2021-07-21: 500 [IU]
  Filled 2021-07-21: qty 5

## 2021-07-21 MED ORDER — HEPARIN SOD (PORK) LOCK FLUSH 100 UNIT/ML IV SOLN
INTRAVENOUS | Status: AC
  Filled 2021-07-21: qty 5

## 2021-07-21 MED ORDER — ZOLEDRONIC ACID 4 MG/5ML IV CONC
3.0000 mg | INTRAVENOUS | Status: DC
  Administered 2021-07-21: 3 mg via INTRAVENOUS
  Filled 2021-07-21: qty 3.75

## 2021-07-21 NOTE — Patient Instructions (Signed)
[facility]  Discharge Instructions: Thank you for choosing [facility] to provide your oncology and hematology care.  If you have a lab appointment with the [facility], please go directly to the [facility] and check in at the registration area.  Wear comfortable clothing and clothing appropriate for easy access to any Portacath or PICC line.   We strive to give you quality time with your provider. You may need to reschedule your appointment if you arrive late (15 or more minutes).  Arriving late affects you and other patients whose appointments are after yours.  Also, if you miss three or more appointments without notifying the office, you may be dismissed from the clinic at the provider's discretion.      For prescription refill requests, have your pharmacy contact our office and allow 72 hours for refills to be completed.    Today you received the following chemotherapy and/or immunotherapy agents zometa   To help prevent nausea and vomiting after your treatment, we encourage you to take your nausea medication as directed.  BELOW ARE SYMPTOMS THAT SHOULD BE REPORTED IMMEDIATELY: *FEVER GREATER THAN 100.4 F (38 C) OR HIGHER *CHILLS OR SWEATING *NAUSEA AND VOMITING THAT IS NOT CONTROLLED WITH YOUR NAUSEA MEDICATION *UNUSUAL SHORTNESS OF BREATH *UNUSUAL BRUISING OR BLEEDING *URINARY PROBLEMS (pain or burning when urinating, or frequent urination) *BOWEL PROBLEMS (unusual diarrhea, constipation, pain near the anus) TENDERNESS IN MOUTH AND THROAT WITH OR WITHOUT PRESENCE OF ULCERS (sore throat, sores in mouth, or a toothache) UNUSUAL RASH, SWELLING OR PAIN  UNUSUAL VAGINAL DISCHARGE OR ITCHING   Items with * indicate a potential emergency and should be followed up as soon as possible or go to the Emergency Department if any problems should occur.  Please show the CHEMOTHERAPY ALERT CARD or IMMUNOTHERAPY ALERT CARD at check-in to the  Emergency Department and triage nurse.  Should you have questions after your visit or need to cancel or reschedule your appointment, please contact [facility]  [phone] and follow the prompts.  Office hours are 8:00 a.m. to 4:30 p.m. Monday - Friday. Please note that voicemails left after 4:00 p.m. may not be returned until the following business day.  We are closed weekends and major holidays. You have access to a nurse at all times for urgent questions. Please call the main number to the clinic [phone] and follow the prompts.  For any non-urgent questions, you may also contact your provider using MyChart. We now offer e-Visits for anyone 33 and older to request care online for non-urgent symptoms. For details visit [email or web address].   Also download the MyChart app! Go to the app store, search "MyChart", open the app, select [facility], and log in with your MyChart username and password.  Due to Covid, a mask is required upon entering the hospital/clinic. If you do not have a mask, one will be given to you upon arrival. For doctor visits, patients may have 1 support person aged 34 or older with them. For treatment visits, patients cannot have anyone with them due to current Covid guidelines and our immunocompromised population.

## 2021-07-21 NOTE — Progress Notes (Signed)
Labs reviewed with MD and treatment team. Per MD to proceed with infusion today.Treatment team updated.   Rony Ratz [employer]

## 2021-07-22 ENCOUNTER — Telehealth: Payer: Self-pay

## 2021-07-22 DIAGNOSIS — M25572 Pain in left ankle and joints of left foot: Secondary | ICD-10-CM | POA: Diagnosis not present

## 2021-07-22 DIAGNOSIS — M19042 Primary osteoarthritis, left hand: Secondary | ICD-10-CM | POA: Diagnosis not present

## 2021-07-22 DIAGNOSIS — M2140 Flat foot [pes planus] (acquired), unspecified foot: Secondary | ICD-10-CM | POA: Diagnosis not present

## 2021-07-22 DIAGNOSIS — M19071 Primary osteoarthritis, right ankle and foot: Secondary | ICD-10-CM | POA: Diagnosis not present

## 2021-07-22 DIAGNOSIS — M1990 Unspecified osteoarthritis, unspecified site: Secondary | ICD-10-CM | POA: Diagnosis not present

## 2021-07-22 DIAGNOSIS — M7989 Other specified soft tissue disorders: Secondary | ICD-10-CM | POA: Diagnosis not present

## 2021-07-22 DIAGNOSIS — M533 Sacrococcygeal disorders, not elsewhere classified: Secondary | ICD-10-CM | POA: Diagnosis not present

## 2021-07-22 DIAGNOSIS — M79673 Pain in unspecified foot: Secondary | ICD-10-CM | POA: Diagnosis not present

## 2021-07-22 DIAGNOSIS — M7731 Calcaneal spur, right foot: Secondary | ICD-10-CM | POA: Diagnosis not present

## 2021-07-22 DIAGNOSIS — M79672 Pain in left foot: Secondary | ICD-10-CM | POA: Diagnosis not present

## 2021-07-22 DIAGNOSIS — Z79899 Other long term (current) drug therapy: Secondary | ICD-10-CM | POA: Diagnosis not present

## 2021-07-22 DIAGNOSIS — I10 Essential (primary) hypertension: Secondary | ICD-10-CM | POA: Diagnosis not present

## 2021-07-22 DIAGNOSIS — S82301D Unspecified fracture of lower end of right tibia, subsequent encounter for closed fracture with routine healing: Secondary | ICD-10-CM | POA: Diagnosis not present

## 2021-07-22 DIAGNOSIS — L409 Psoriasis, unspecified: Secondary | ICD-10-CM | POA: Diagnosis not present

## 2021-07-22 DIAGNOSIS — M79642 Pain in left hand: Secondary | ICD-10-CM | POA: Diagnosis not present

## 2021-07-22 DIAGNOSIS — M25571 Pain in right ankle and joints of right foot: Secondary | ICD-10-CM | POA: Diagnosis not present

## 2021-07-22 DIAGNOSIS — M47816 Spondylosis without myelopathy or radiculopathy, lumbar region: Secondary | ICD-10-CM | POA: Diagnosis not present

## 2021-07-22 DIAGNOSIS — M109 Gout, unspecified: Secondary | ICD-10-CM | POA: Diagnosis not present

## 2021-07-22 DIAGNOSIS — M7732 Calcaneal spur, left foot: Secondary | ICD-10-CM | POA: Diagnosis not present

## 2021-07-22 DIAGNOSIS — M79671 Pain in right foot: Secondary | ICD-10-CM | POA: Diagnosis not present

## 2021-07-22 DIAGNOSIS — N183 Chronic kidney disease, stage 3 unspecified: Secondary | ICD-10-CM | POA: Diagnosis not present

## 2021-07-22 DIAGNOSIS — M79641 Pain in right hand: Secondary | ICD-10-CM | POA: Diagnosis not present

## 2021-07-22 DIAGNOSIS — L4050 Arthropathic psoriasis, unspecified: Secondary | ICD-10-CM | POA: Diagnosis not present

## 2021-07-22 DIAGNOSIS — E1169 Type 2 diabetes mellitus with other specified complication: Secondary | ICD-10-CM | POA: Diagnosis not present

## 2021-07-22 DIAGNOSIS — M25472 Effusion, left ankle: Secondary | ICD-10-CM | POA: Diagnosis not present

## 2021-07-22 DIAGNOSIS — M545 Low back pain, unspecified: Secondary | ICD-10-CM | POA: Diagnosis not present

## 2021-07-22 DIAGNOSIS — M19041 Primary osteoarthritis, right hand: Secondary | ICD-10-CM | POA: Diagnosis not present

## 2021-07-22 DIAGNOSIS — M19072 Primary osteoarthritis, left ankle and foot: Secondary | ICD-10-CM | POA: Diagnosis not present

## 2021-07-22 NOTE — Telephone Encounter (Signed)
Patient left a voicemail stating that she seen her rheumatologist  today, told them that she of her cancer med for 3 weeks to see if that improves her arthritis, pt states she had multiple  xrays and blood work and she states she was told she has psoriatic and another arthritis and she states she was told to call Dr. Janese Banks and see if leflunomide Jolee Ewing) would be suitable for her and her cancer history. Patient gave Dr. Kathlene November contact info [phone] sjc

## 2021-07-23 ENCOUNTER — Other Ambulatory Visit: Payer: Self-pay | Admitting: Nurse Practitioner

## 2021-07-23 ENCOUNTER — Encounter: Payer: Self-pay | Admitting: Oncology

## 2021-07-23 DIAGNOSIS — E1140 Type 2 diabetes mellitus with diabetic neuropathy, unspecified: Secondary | ICD-10-CM | POA: Diagnosis not present

## 2021-07-23 DIAGNOSIS — N189 Chronic kidney disease, unspecified: Secondary | ICD-10-CM | POA: Diagnosis not present

## 2021-07-23 DIAGNOSIS — E7800 Pure hypercholesterolemia, unspecified: Secondary | ICD-10-CM | POA: Diagnosis not present

## 2021-07-23 DIAGNOSIS — I10 Essential (primary) hypertension: Secondary | ICD-10-CM | POA: Diagnosis not present

## 2021-07-23 DIAGNOSIS — E1165 Type 2 diabetes mellitus with hyperglycemia: Secondary | ICD-10-CM | POA: Diagnosis not present

## 2021-07-23 NOTE — Progress Notes (Signed)
Hematology/Oncology Consult note [facility]  [phone] [phone]  Patient Care Team: [name], MD as PCP - General (Internal Medicine) Minus Breeding, MD as PCP - Cardiology (Cardiology) Jacelyn Pi, MD as Referring Physician (Endocrinology) Lahoma Rocker, MD as Consulting Physician (Rheumatology) Noreene Filbert, MD as Referring Physician (Radiation Oncology) [name], MD as Consulting Physician (Oncology) Jovita Kussmaul, MD as Consulting Physician (General Surgery) Rico Junker, RN as Registered Nurse Theodore Demark, RN as Registered Nurse Lavonna Monarch, MD as Consulting Physician (Dermatology)   Name of the patient: [name]  [id number]  04-13-1956   Date of visit: 07/23/21  Diagnosis-  Invasive lobular carcinoma of the left breast pathological prognostic stage Ib pT2 pN2 acM0 ER/PR positive HER-2/neu negative s/p mastectomy and targeted node dissection    Chief complaint/ Reason for visit- routine follow-up of breast cancer on Aromasin and to receive Zometa  Heme/Onc history: Patient is a 65 year old female with a past medical history significant for hypertension, CKD, diabetes, nonischemic cardiomyopathy among other medical problems.  She has not undergone a mammogram for several years.  Mammogram on October 2020 was done after she had a palpable area of concern in the left breast which showed an irregular mass with increased vascularity measuring 3.1 x 2.4 x 4.5 cm.  The nodularity extended towards the nipple measuring at least 5.4 cm.  Single abnormal lymph node in the left axilla with cortical thickness of 0.7 cm.  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     PET scan showed hypermetabolic left axillary lymph nodes which was reviewed at tumor board and  they were at least found to be 3-4.  Retroareolar left breast mass 2.5 x 3.2 cm in size.  No evidence of distant metastatic disease.  There were small to borderline enlarged retroperitoneal lymph nodes 10 mm in size with an SUV of 4.3.  Retroperitoneal lymph nodes will require follow-up in the future   MRI showed 2 sites of biopsy-proven malignancy in the lateral left breast at posterior and anterior depth.  There is non-mass enhancement spanning between the 2 sites.  Morphologically abnormal level 1 axillary lymph node consistent with biopsy-proven metastatic disease.   Final pathology showed 2 separate tumors one which was 4.5 cm and the other one was 4.4 cm in size.  Grade 2 ER/PR positive HER-2/neu negative with negative margins. 5 out of 11 lymph nodes were positive for malignancy.  Extranodal extension present.  Largest size of metastatic deposit 14 mm.  mpT2 mpN2   Patient is not a candidate for anthracycline-based chemotherapy per cardiology.  Adjuvant TC chemotherapy completed on 03/11/2020.  She completed adjuvant radiation therapy as well and started Arimidex which she could not tolerate and was to Aromasin on 06/19/2020    Interval history-patient is tolerating Aromasin well but does report diffuse joint pains which are worsening.  She does have baseline psoriasis is well and is not sure if her arthritis is due to psoriasis versus Aromasin.  She denies other complaints at this time  ECOG PS- 1 Pain scale- 4   Review of systems- Review of Systems  Constitutional:  Positive for malaise/fatigue. Negative for chills, fever and weight loss.  HENT:  Negative for congestion, ear discharge and nosebleeds.   Eyes:  Negative for blurred vision.  Respiratory:  Negative for cough, hemoptysis, sputum production, shortness of breath and wheezing.   Cardiovascular:  Negative for chest pain, palpitations, orthopnea and claudication.  Gastrointestinal:  Negative for abdominal pain, blood in stool,  constipation, diarrhea, heartburn, melena, nausea and vomiting.  Genitourinary:  Negative for dysuria, flank pain, frequency, hematuria and urgency.  Musculoskeletal:  Positive for joint pain. Negative for back pain and myalgias.  Skin:  Negative for rash.  Neurological:  Negative for dizziness, tingling, focal weakness, seizures, weakness and headaches.  Endo/Heme/Allergies:  Does not bruise/bleed easily.  Psychiatric/Behavioral:  Negative for depression and suicidal ideas. The patient does not have insomnia.      Allergies  Allergen Reactions   Hydralazine Swelling    Eye Swelling   Other Other (See Comments)    Unknown blood pressure medication caused face swelling     Past Medical History:  Diagnosis Date   Arthritis of both feet    Gout   Cancer ([place])    Breast Cancer on left   Chronic combined systolic and diastolic CHF (congestive heart failure) ([place])    a. Echo (06/2014):  Severe LVH, EF 20-25%, no RWMA, Gr 3 DD, trivial MR, mild LAE, mild RVE, PASP 38 mmHg .>> b. Echo 2/16 EF 50-55% //  // c. Echo 02/2019: EF 50-55, mod LVH, Gr 1 DD, mild LAE // Echocardiogram 02/2020: EF 60-65, no RWMA, mild LVH, Gr 1 DD, normal RVSF, RVSP 30.5    CKD (chronic kidney disease), stage III (HCC)    now stage 4   Diabetes mellitus type 2 in obese [facility])    Diverticular disease    Dyspnea    Family history of breast cancer    Family history of leukemia    Family history of lung cancer    Family history of testicular cancer    Family history of throat cancer    Fatty liver    History of GI diverticular bleed    Hyperlipidemia    Hyperphosphatemia    Hypertension    Hypertensive heart disease    a. Renal Artery Duplex (8/15):  no RAS   Iron deficiency anemia    NICM (nonischemic cardiomyopathy) ([place])    a. Nuclear (06/2014):  EF 32%, apical thinning, no definite scar or ischemia;  b. Echo (2/16):  Mild LVH, EF 50-55%, Gr 2 DD, no RWMA   Obesity    Personal history of chemotherapy     Personal history of radiation therapy    PONV (postoperative nausea and vomiting)    after D and C   Proteinuria    Psoriasis    PUD (peptic ulcer disease)    Secondary hyperparathyroidism, renal (HCC)    Tobacco abuse    UTI (lower urinary tract infection)    Vaginal dryness, menopausal    Vaginal yeast infection      Past Surgical History:  Procedure Laterality Date   BREAST BIOPSY Left 2020   positive    COLONOSCOPY     DILATION AND CURETTAGE OF UTERUS     MASTECTOMY Left 2020   MASTECTOMY W/ SENTINEL NODE BIOPSY Left 11/12/2019   MASTECTOMY W/ SENTINEL NODE BIOPSY Left 11/12/2019   Procedure: LEFT MASTECTOMY WITH SENTINEL LYMPH NODE BIOPSY AND TARGETED NODE DISSECTION;  Surgeon: Jovita Kussmaul, MD;  Location: [facility];  Service: General;  Laterality: Left;   None     PORTA CATH INSERTION N/A 12/11/2019   Procedure: PORTA CATH INSERTION;  Surgeon: Katha Cabal, MD;  Location: [facility];  Service: Cardiovascular;  Laterality: N/A;    Social History   Socioeconomic History   Marital status: Widowed    Spouse name: Not on file   Number of children: 2   Years of education: 7   Highest education level: Not on file  Occupational History   Not on file  Tobacco Use   Smoking status: Former    Types: Cigarettes    Quit date: 07/05/2014    Years since quitting: 7.0   Smokeless tobacco: Never  Vaping Use   Vaping Use: Never used  Substance and Sexual Activity   Alcohol use: No   Drug use: No   Sexual activity: Not Currently  Other Topics Concern   Not on file  Social History Narrative   Currently lives in a house with her husband.    Fun: Watch TV.   Denies religious beliefs effecting health care.    Social Determinants of Health   Financial Resource Strain: Not on file  Food Insecurity: Not on file  Transportation Needs: Not on file  Physical Activity: Not on file  Stress: Not on file  Social Connections: Not on file  Intimate Partner Violence:  Not on file    Family History  Problem Relation Age of Onset   Heart attack Paternal Grandfather    Stroke Paternal Grandmother    Hypertension Paternal Grandmother    Kidney disease Mother    Diabetes Mother    Testicular cancer Father        diagnosed older than 67   Coronary artery disease Other    Hypertension Other    Breast cancer Sister        diagnosed late 50s, negative genetics   Leukemia Brother        chronic   Throat cancer Brother    Pneumonia Sister 2   Lung cancer Paternal Uncle        diagnosed over 42     Current Outpatient Medications:    acetaminophen (TYLENOL) 500 MG tablet, Take 1,000 mg by mouth every 6 (six) hours as needed for mild pain, moderate pain or headache. For pain, Disp: , Rfl:    allopurinol (ZYLOPRIM) 100 MG tablet, Take 200 mg by mouth daily., Disp: , Rfl:    aspirin 81 MG EC tablet, Take 1 tablet (81 mg total) by mouth daily., Disp: 30 tablet, Rfl:    atorvastatin (LIPITOR) 40 MG tablet, Take 1 tablet (40 mg total) by mouth daily at 6 PM., Disp: 90 tablet, Rfl: 3   B-D ULTRAFINE III SHORT PEN 31G X 8 MM MISC, USE AS DIRECTED *EMERGENCY FILL*, Disp: , Rfl: 0   betamethasone dipropionate 0.05 % cream, Apply topically 2 (two) times daily., Disp: 30 g, Rfl: 0   blood glucose meter kit and supplies KIT, Dispense based on patient and insurance preference. Use up to four times daily as directed. (FOR ICD-9 250.00, 250.01)., Disp: 1 each, Rfl: 0   calcipotriene (DOVONOX) 0.005 % cream, APPLY TO AFFECTED AREA TWICE A DAY, Disp: 120 g, Rfl: 2   calcitRIOL (ROCALTROL) 0.25 MCG capsule, Take by mouth., Disp: , Rfl:    calcium carbonate (OSCAL) 1500 (600 Ca) MG TABS tablet, Take 1 tablet by mouth 2 (two) times daily with a meal., Disp: , Rfl:    Colchicine 0.6 MG CAPS, Take 0.6 mg by mouth every other day. , Disp: , Rfl:    exemestane (AROMASIN) 25 MG tablet, TAKE 1 TABLET (25 MG TOTAL) BY MOUTH  DAILY AFTER BREAKFAST., Disp: 90 tablet, Rfl: 1    furosemide (LASIX) 20 MG tablet, Take 40 mg by mouth 2 (two) times daily., Disp: , Rfl:    gabapentin (NEURONTIN) 100 MG capsule, TAKE 2 CAPSULES BY MOUTH 3 TIMES DAILY., Disp: 180 capsule, Rfl: 1   insulin aspart (NOVOLOG FLEXPEN) 100 UNIT/ML FlexPen, Inject 5 Units into the skin 3 (three) times daily with meals. Additional insulin (5 units PLUS) CBG 70 - 120: 0 units CBG 121 - 150: 1 unit CBG 151 - 200: 2 units CBG 201 - 250: 3 units CBG 251 - 350: 4 units CBG 351 - 400: 6 units, Disp: 15 mL, Rfl: 11   insulin glargine (LANTUS) 100 UNIT/ML injection, Inject 20 Units into the skin at bedtime. , Disp: , Rfl:    isosorbide mononitrate (IMDUR) 60 MG 24 hr tablet, Take 1 tablet (60 mg total) by mouth daily., Disp: 90 tablet, Rfl: 3   lidocaine-prilocaine (EMLA) cream, Apply to affected area once, Disp: 30 g, Rfl: 3   metoprolol tartrate (LOPRESSOR) 25 MG tablet, TAKE 1 TABLET BY MOUTH TWICE A DAY, Disp: 180 tablet, Rfl: 3   ONE TOUCH ULTRA TEST test strip, 1 each by Other route 4 (four) times daily. , Disp: , Rfl: 1   ONETOUCH DELICA LANCETS 57Q MISC, Inject 33 g into the skin 4 (four) times daily. , Disp: , Rfl: 1   senna (SENOKOT) 8.6 MG tablet, Take 1 tablet by mouth daily., Disp: , Rfl:    telmisartan (MICARDIS) 20 MG tablet, TAKE 1 TABLET BY MOUTH EVERY DAY, Disp: 90 tablet, Rfl: 3  Physical exam:  Vitals:   07/21/21 1131  BP: 122/82  Pulse: 66  Resp: 18  Temp: (!) 97.4 F (36.3 C)  TempSrc: Tympanic  SpO2: 96%  Weight: 194 lb 6.4 oz (88.2 kg)   Physical Exam Constitutional:      General: She is not in acute distress. Cardiovascular:     Rate and Rhythm: Normal rate and regular rhythm.     Heart sounds: Normal heart sounds.  Pulmonary:     Effort: Pulmonary effort is normal.     Breath sounds: Normal breath sounds.  Abdominal:     General: Bowel sounds are normal.     Palpations: Abdomen is soft.  Skin:    General: Skin is warm and dry.  Neurological:     Mental Status: She  is alert and oriented to person, place, and time.  Breast exam: Patient is s/p left mastectomy without reconstruction and a well-healed surgical scar.  No evidence of chest wall recurrence.  No palpable left axillary adenopathy.  No palpable masses in right breast or right axillary adenopathy.  CMP Latest Ref Rng & Units 07/21/2021  Glucose 70 - 99 mg/dL 161(H)  BUN 8 - 23 mg/dL 52(H)  Creatinine 0.44 - 1.00 mg/dL 2.32(H)  Sodium 135 - 145 mmol/L 136  Potassium 3.5 - 5.1 mmol/L 3.8  Chloride 98 - 111 mmol/L 97(L)  CO2 22 - 32 mmol/L 29  Calcium 8.9 - 10.3 mg/dL 9.8  Total Protein 6.5 - 8.1 g/dL 7.5  Total Bilirubin 0.3 - 1.2 mg/dL 0.8  Alkaline Phos 38 - 126 U/L 92  AST 15 - 41 U/L 21  ALT 0 - 44 U/L 25   CBC Latest Ref Rng & Units 07/21/2021  WBC 4.0 - 10.5 K/uL 6.0  Hemoglobin 12.0 - 15.0 g/dL 14.0  Hematocrit 36.0 - 46.0 % 42.5  Platelets 150 - 400  K/uL 191   Assessment and plan- Patient is a 65 y.o. female with pathological prognostic stage Ib invasive lobular carcinoma pT2 pN2 acM0 ER/PR positive HER-2/neu negative s/p left mastectomy and targeted node dissection.  She has completed 4 cycles of adjuvant TC chemotherapy And adjuvant radiation therapy.  She is here for for routine follow-up of breast cancer on Aromasin and to receive her Zometa  Patient is tolerating Aromasin well for the most part but does report diffuse joint pains and it is unclear if it is secondary to psoriatic arthritis versus AI.  I have asked her to hold off on taking Aromasin for 2 to 3 weeks and see if her symptoms are significantly better.  If they do improve then we will switch her to Arimidex at that time since she has also not tolerated letrozole in the past.  However if the symptoms are not significantly better then she also plans to see rheumatology and get started on medications for possible psoriatic arthritis.  She will receive lower dose of Zometa today at 3 mg due to her CKD.  I will see her back in 6  months with labs and for next dose of Zometa.  We will also schedule her right breast mammogram in October 2022   Visit Diagnosis 1. Encounter for follow-up surveillance of breast cancer   2. Use of exemestane (Aromasin)   3. Long term (current) use of bisphosphonates      Dr. Randa Evens, MD, MPH [facility] [phone] 07/23/2021 7:21 AM

## 2021-07-24 ENCOUNTER — Encounter: Payer: Self-pay | Admitting: Oncology

## 2021-08-10 ENCOUNTER — Telehealth: Payer: Self-pay | Admitting: *Deleted

## 2021-08-10 NOTE — Telephone Encounter (Signed)
Pt called just now and said that she stopped the aromasin for 3 weeks like she was told to do. She did start the leflunomide. she will being seeing md 8/31 for the arthritis and how she is doing on the new drug. She says being off Aromasin did not change her aches and pain. she wants to know if it is ok to start back with aromasin. she has already tried arimidex and it did not work for her. I told her to go back on aromosin. She will start back today

## 2021-08-11 ENCOUNTER — Other Ambulatory Visit: Payer: Self-pay | Admitting: Dermatology

## 2021-08-11 DIAGNOSIS — I10 Essential (primary) hypertension: Secondary | ICD-10-CM | POA: Diagnosis not present

## 2021-08-11 DIAGNOSIS — L409 Psoriasis, unspecified: Secondary | ICD-10-CM | POA: Diagnosis not present

## 2021-08-11 DIAGNOSIS — M2140 Flat foot [pes planus] (acquired), unspecified foot: Secondary | ICD-10-CM | POA: Diagnosis not present

## 2021-08-11 DIAGNOSIS — M109 Gout, unspecified: Secondary | ICD-10-CM | POA: Diagnosis not present

## 2021-08-11 DIAGNOSIS — M1990 Unspecified osteoarthritis, unspecified site: Secondary | ICD-10-CM | POA: Diagnosis not present

## 2021-08-11 DIAGNOSIS — M79673 Pain in unspecified foot: Secondary | ICD-10-CM | POA: Diagnosis not present

## 2021-08-11 DIAGNOSIS — E1169 Type 2 diabetes mellitus with other specified complication: Secondary | ICD-10-CM | POA: Diagnosis not present

## 2021-08-11 DIAGNOSIS — L4050 Arthropathic psoriasis, unspecified: Secondary | ICD-10-CM | POA: Diagnosis not present

## 2021-08-11 DIAGNOSIS — Z79899 Other long term (current) drug therapy: Secondary | ICD-10-CM | POA: Diagnosis not present

## 2021-08-11 DIAGNOSIS — N183 Chronic kidney disease, stage 3 unspecified: Secondary | ICD-10-CM | POA: Diagnosis not present

## 2021-08-13 DIAGNOSIS — I428 Other cardiomyopathies: Secondary | ICD-10-CM | POA: Diagnosis not present

## 2021-08-13 DIAGNOSIS — N184 Chronic kidney disease, stage 4 (severe): Secondary | ICD-10-CM | POA: Diagnosis not present

## 2021-08-13 DIAGNOSIS — D649 Anemia, unspecified: Secondary | ICD-10-CM | POA: Diagnosis not present

## 2021-08-13 DIAGNOSIS — C50912 Malignant neoplasm of unspecified site of left female breast: Secondary | ICD-10-CM | POA: Diagnosis not present

## 2021-08-13 DIAGNOSIS — N2581 Secondary hyperparathyroidism of renal origin: Secondary | ICD-10-CM | POA: Diagnosis not present

## 2021-08-13 DIAGNOSIS — M109 Gout, unspecified: Secondary | ICD-10-CM | POA: Diagnosis not present

## 2021-08-13 DIAGNOSIS — E1122 Type 2 diabetes mellitus with diabetic chronic kidney disease: Secondary | ICD-10-CM | POA: Diagnosis not present

## 2021-08-13 DIAGNOSIS — I129 Hypertensive chronic kidney disease with stage 1 through stage 4 chronic kidney disease, or unspecified chronic kidney disease: Secondary | ICD-10-CM | POA: Diagnosis not present

## 2021-09-03 ENCOUNTER — Telehealth: Payer: Self-pay | Admitting: *Deleted

## 2021-09-03 NOTE — Telephone Encounter (Signed)
Pt called to say that when she started the new AI=exemestane with in 2 days she started feeling the pains from then. She has all body joints and sweats and has increase heart rate. She can't tolerate it. I told her to stop the med and I will check with Dr. Janese Banks and she can let me know which medication she can try. The pt. Has already tried letrozole in past and it did not work for her. That leaves aromasin or tamoxifen. Will call pt back with MD decision.. Pt. Is agreeable to this plan

## 2021-09-04 ENCOUNTER — Telehealth: Payer: Self-pay | Admitting: *Deleted

## 2021-09-04 MED ORDER — ANASTROZOLE 1 MG PO TABS: 1.0000 mg | ORAL_TABLET | Freq: Every day | ORAL | 3 refills | Status: DC

## 2021-09-04 NOTE — Telephone Encounter (Signed)
Called pt and let her know that she is to stay off the exemestane for 2 weeks and then start arimidex. I will call it into the pharmacy and once she had 2 weeks off and then start the new medication then she is to call and let us know if she has any side effects with the new med arimidex. Patient agreeable to the plan.

## 2021-09-13 ENCOUNTER — Other Ambulatory Visit: Payer: Self-pay | Admitting: Dermatology

## 2021-09-15 ENCOUNTER — Other Ambulatory Visit: Payer: Self-pay | Admitting: Oncology

## 2021-10-06 ENCOUNTER — Telehealth: Payer: Self-pay

## 2021-10-06 ENCOUNTER — Other Ambulatory Visit: Payer: Self-pay

## 2021-10-06 MED ORDER — LETROZOLE 2.5 MG PO TABS: 2.5000 mg | ORAL_TABLET | Freq: Every day | ORAL | 0 refills | Status: DC

## 2021-10-07 ENCOUNTER — Other Ambulatory Visit: Payer: Self-pay

## 2021-10-07 ENCOUNTER — Ambulatory Visit
Admission: RE | Admit: 2021-10-07 | Discharge: 2021-10-07 | Disposition: A | Discharge status: Home / Self Care | Payer: PPO | Source: Ambulatory Visit | Attending: Oncology | Admitting: Oncology

## 2021-10-07 DIAGNOSIS — Z1231 Encounter for screening mammogram for malignant neoplasm of breast: Secondary | ICD-10-CM | POA: Diagnosis not present

## 2021-10-07 DIAGNOSIS — Z08 Encounter for follow-up examination after completed treatment for malignant neoplasm: Secondary | ICD-10-CM | POA: Diagnosis not present

## 2021-10-07 DIAGNOSIS — Z853 Personal history of malignant neoplasm of breast: Secondary | ICD-10-CM | POA: Insufficient documentation

## 2021-10-20 DIAGNOSIS — L409 Psoriasis, unspecified: Secondary | ICD-10-CM | POA: Diagnosis not present

## 2021-10-20 DIAGNOSIS — M1990 Unspecified osteoarthritis, unspecified site: Secondary | ICD-10-CM | POA: Diagnosis not present

## 2021-10-20 DIAGNOSIS — M25473 Effusion, unspecified ankle: Secondary | ICD-10-CM | POA: Diagnosis not present

## 2021-10-20 DIAGNOSIS — L4050 Arthropathic psoriasis, unspecified: Secondary | ICD-10-CM | POA: Diagnosis not present

## 2021-10-20 DIAGNOSIS — N1831 Chronic kidney disease, stage 3a: Secondary | ICD-10-CM | POA: Diagnosis not present

## 2021-10-20 DIAGNOSIS — M109 Gout, unspecified: Secondary | ICD-10-CM | POA: Diagnosis not present

## 2021-10-20 DIAGNOSIS — Z79899 Other long term (current) drug therapy: Secondary | ICD-10-CM | POA: Diagnosis not present

## 2021-10-20 DIAGNOSIS — E1169 Type 2 diabetes mellitus with other specified complication: Secondary | ICD-10-CM | POA: Diagnosis not present

## 2021-10-20 DIAGNOSIS — M79673 Pain in unspecified foot: Secondary | ICD-10-CM | POA: Diagnosis not present

## 2021-10-22 ENCOUNTER — Encounter: Payer: Self-pay | Admitting: Oncology

## 2021-10-22 NOTE — Telephone Encounter (Signed)
See note, 10/18

## 2021-10-30 ENCOUNTER — Other Ambulatory Visit: Payer: Self-pay | Admitting: Oncology

## 2021-11-04 ENCOUNTER — Telehealth: Payer: Self-pay | Admitting: *Deleted

## 2021-11-04 ENCOUNTER — Other Ambulatory Visit: Payer: Self-pay | Admitting: *Deleted

## 2021-11-04 NOTE — Telephone Encounter (Signed)
He meds that pt told me through Dr. Kathlene November was Tremfya or cosentyx. Per Janese Banks she said either is ok . I have made a letter and faxed it to his office at [phone]. Pt aware and will see MD early dec.

## 2021-11-11 DIAGNOSIS — N2581 Secondary hyperparathyroidism of renal origin: Secondary | ICD-10-CM | POA: Diagnosis not present

## 2021-11-11 DIAGNOSIS — N184 Chronic kidney disease, stage 4 (severe): Secondary | ICD-10-CM | POA: Diagnosis not present

## 2021-11-11 DIAGNOSIS — I428 Other cardiomyopathies: Secondary | ICD-10-CM | POA: Diagnosis not present

## 2021-11-11 DIAGNOSIS — E1122 Type 2 diabetes mellitus with diabetic chronic kidney disease: Secondary | ICD-10-CM | POA: Diagnosis not present

## 2021-11-11 DIAGNOSIS — I129 Hypertensive chronic kidney disease with stage 1 through stage 4 chronic kidney disease, or unspecified chronic kidney disease: Secondary | ICD-10-CM | POA: Diagnosis not present

## 2021-11-11 DIAGNOSIS — D649 Anemia, unspecified: Secondary | ICD-10-CM | POA: Diagnosis not present

## 2021-11-11 DIAGNOSIS — M109 Gout, unspecified: Secondary | ICD-10-CM | POA: Diagnosis not present

## 2021-11-11 DIAGNOSIS — L4050 Arthropathic psoriasis, unspecified: Secondary | ICD-10-CM | POA: Diagnosis not present

## 2021-11-11 DIAGNOSIS — C50912 Malignant neoplasm of unspecified site of left female breast: Secondary | ICD-10-CM | POA: Diagnosis not present

## 2021-11-24 DIAGNOSIS — N189 Chronic kidney disease, unspecified: Secondary | ICD-10-CM | POA: Diagnosis not present

## 2021-11-24 DIAGNOSIS — E7800 Pure hypercholesterolemia, unspecified: Secondary | ICD-10-CM | POA: Diagnosis not present

## 2021-11-24 DIAGNOSIS — E1165 Type 2 diabetes mellitus with hyperglycemia: Secondary | ICD-10-CM | POA: Diagnosis not present

## 2021-11-24 DIAGNOSIS — E1140 Type 2 diabetes mellitus with diabetic neuropathy, unspecified: Secondary | ICD-10-CM | POA: Diagnosis not present

## 2021-11-24 DIAGNOSIS — I10 Essential (primary) hypertension: Secondary | ICD-10-CM | POA: Diagnosis not present

## 2021-11-29 ENCOUNTER — Other Ambulatory Visit: Payer: Self-pay | Admitting: Physician Assistant

## 2021-11-30 ENCOUNTER — Other Ambulatory Visit: Payer: Self-pay | Admitting: Nurse Practitioner

## 2021-12-01 DIAGNOSIS — M25473 Effusion, unspecified ankle: Secondary | ICD-10-CM | POA: Diagnosis not present

## 2021-12-01 DIAGNOSIS — M25579 Pain in unspecified ankle and joints of unspecified foot: Secondary | ICD-10-CM | POA: Diagnosis not present

## 2021-12-01 DIAGNOSIS — L409 Psoriasis, unspecified: Secondary | ICD-10-CM | POA: Diagnosis not present

## 2021-12-01 DIAGNOSIS — Z79899 Other long term (current) drug therapy: Secondary | ICD-10-CM | POA: Diagnosis not present

## 2021-12-01 DIAGNOSIS — M549 Dorsalgia, unspecified: Secondary | ICD-10-CM | POA: Diagnosis not present

## 2021-12-01 DIAGNOSIS — M109 Gout, unspecified: Secondary | ICD-10-CM | POA: Diagnosis not present

## 2021-12-01 DIAGNOSIS — N1831 Chronic kidney disease, stage 3a: Secondary | ICD-10-CM | POA: Diagnosis not present

## 2021-12-01 DIAGNOSIS — L4050 Arthropathic psoriasis, unspecified: Secondary | ICD-10-CM | POA: Diagnosis not present

## 2021-12-01 DIAGNOSIS — M1990 Unspecified osteoarthritis, unspecified site: Secondary | ICD-10-CM | POA: Diagnosis not present

## 2021-12-06 ENCOUNTER — Other Ambulatory Visit: Payer: Self-pay | Admitting: Cardiology

## 2021-12-09 ENCOUNTER — Other Ambulatory Visit: Payer: Self-pay | Admitting: *Deleted

## 2021-12-09 ENCOUNTER — Encounter: Payer: Self-pay | Admitting: Oncology

## 2021-12-09 MED ORDER — GABAPENTIN 100 MG PO CAPS: ORAL_CAPSULE | ORAL | 1 refills | Status: DC

## 2022-01-18 ENCOUNTER — Other Ambulatory Visit: Payer: Self-pay | Admitting: Dermatology

## 2022-01-22 ENCOUNTER — Inpatient Hospital Stay: Payer: PPO

## 2022-01-22 ENCOUNTER — Inpatient Hospital Stay: Payer: PPO | Admitting: Oncology

## 2022-01-26 ENCOUNTER — Inpatient Hospital Stay (HOSPITAL_BASED_OUTPATIENT_CLINIC_OR_DEPARTMENT_OTHER): Payer: PPO | Admitting: Nurse Practitioner

## 2022-01-26 ENCOUNTER — Other Ambulatory Visit: Payer: Self-pay

## 2022-01-26 ENCOUNTER — Inpatient Hospital Stay: Payer: PPO

## 2022-01-26 ENCOUNTER — Inpatient Hospital Stay: Payer: PPO | Attending: Oncology

## 2022-01-26 ENCOUNTER — Encounter: Payer: Self-pay | Admitting: Nurse Practitioner

## 2022-01-26 VITALS — BP 115/84 | HR 66 | Temp 97.8°F | Resp 18 | Wt 190.0 lb

## 2022-01-26 DIAGNOSIS — C50912 Malignant neoplasm of unspecified site of left female breast: Secondary | ICD-10-CM | POA: Insufficient documentation

## 2022-01-26 DIAGNOSIS — Z79899 Other long term (current) drug therapy: Secondary | ICD-10-CM | POA: Insufficient documentation

## 2022-01-26 DIAGNOSIS — Z9221 Personal history of antineoplastic chemotherapy: Secondary | ICD-10-CM | POA: Diagnosis not present

## 2022-01-26 DIAGNOSIS — C773 Secondary and unspecified malignant neoplasm of axilla and upper limb lymph nodes: Secondary | ICD-10-CM | POA: Diagnosis not present

## 2022-01-26 DIAGNOSIS — Z7982 Long term (current) use of aspirin: Secondary | ICD-10-CM | POA: Insufficient documentation

## 2022-01-26 DIAGNOSIS — Z794 Long term (current) use of insulin: Secondary | ICD-10-CM | POA: Insufficient documentation

## 2022-01-26 DIAGNOSIS — Z08 Encounter for follow-up examination after completed treatment for malignant neoplasm: Secondary | ICD-10-CM

## 2022-01-26 DIAGNOSIS — Z79811 Long term (current) use of aromatase inhibitors: Secondary | ICD-10-CM | POA: Diagnosis not present

## 2022-01-26 DIAGNOSIS — E1122 Type 2 diabetes mellitus with diabetic chronic kidney disease: Secondary | ICD-10-CM | POA: Diagnosis not present

## 2022-01-26 DIAGNOSIS — Z87891 Personal history of nicotine dependence: Secondary | ICD-10-CM | POA: Diagnosis not present

## 2022-01-26 DIAGNOSIS — Z170 Estrogen receptor positive status [ER+]: Secondary | ICD-10-CM | POA: Diagnosis not present

## 2022-01-26 DIAGNOSIS — Z923 Personal history of irradiation: Secondary | ICD-10-CM | POA: Insufficient documentation

## 2022-01-26 DIAGNOSIS — N183 Chronic kidney disease, stage 3 unspecified: Secondary | ICD-10-CM | POA: Insufficient documentation

## 2022-01-26 DIAGNOSIS — Z853 Personal history of malignant neoplasm of breast: Secondary | ICD-10-CM | POA: Diagnosis not present

## 2022-01-26 DIAGNOSIS — Z7983 Long term (current) use of bisphosphonates: Secondary | ICD-10-CM | POA: Diagnosis not present

## 2022-01-26 DIAGNOSIS — I5042 Chronic combined systolic (congestive) and diastolic (congestive) heart failure: Secondary | ICD-10-CM | POA: Insufficient documentation

## 2022-01-26 DIAGNOSIS — I130 Hypertensive heart and chronic kidney disease with heart failure and stage 1 through stage 4 chronic kidney disease, or unspecified chronic kidney disease: Secondary | ICD-10-CM | POA: Diagnosis not present

## 2022-01-26 DIAGNOSIS — Z9012 Acquired absence of left breast and nipple: Secondary | ICD-10-CM | POA: Insufficient documentation

## 2022-01-26 DIAGNOSIS — C50812 Malignant neoplasm of overlapping sites of left female breast: Secondary | ICD-10-CM

## 2022-01-26 DIAGNOSIS — L409 Psoriasis, unspecified: Secondary | ICD-10-CM | POA: Insufficient documentation

## 2022-01-26 DIAGNOSIS — L4050 Arthropathic psoriasis, unspecified: Secondary | ICD-10-CM | POA: Insufficient documentation

## 2022-01-26 LAB — COMPREHENSIVE METABOLIC PANEL
ALT: 19 U/L (ref 0–44)
AST: 21 U/L (ref 15–41)
Albumin: 3.9 g/dL (ref 3.5–5.0)
Alkaline Phosphatase: 95 U/L (ref 38–126)
Anion gap: 9 (ref 5–15)
BUN: 43 mg/dL — ABNORMAL HIGH (ref 8–23)
CO2: 27 mmol/L (ref 22–32)
Calcium: 8.9 mg/dL (ref 8.9–10.3)
Chloride: 100 mmol/L (ref 98–111)
Creatinine, Ser: 1.84 mg/dL — ABNORMAL HIGH (ref 0.44–1.00)
GFR, Estimated: 30 mL/min — ABNORMAL LOW (ref >60)
Glucose, Bld: 165 mg/dL — ABNORMAL HIGH (ref 70–99)
Potassium: 4.1 mmol/L (ref 3.5–5.1)
Sodium: 136 mmol/L (ref 135–145)
Total Bilirubin: 0.7 mg/dL (ref 0.3–1.2)
Total Protein: 7 g/dL (ref 6.5–8.1)

## 2022-01-26 LAB — CBC WITH DIFFERENTIAL/PLATELET
Abs Immature Granulocytes: 0.02 10*3/uL (ref 0.00–0.07)
Basophils Absolute: 0.1 10*3/uL (ref 0.0–0.1)
Basophils Relative: 1 %
Eosinophils Absolute: 0.3 10*3/uL (ref 0.0–0.5)
Eosinophils Relative: 5 %
HCT: 45.2 % (ref 36.0–46.0)
Hemoglobin: 14.4 g/dL (ref 12.0–15.0)
Immature Granulocytes: 0 %
Lymphocytes Relative: 16 %
Lymphs Abs: 1 10*3/uL (ref 0.7–4.0)
MCH: 28.3 pg (ref 26.0–34.0)
MCHC: 31.9 g/dL (ref 30.0–36.0)
MCV: 88.8 fL (ref 80.0–100.0)
Monocytes Absolute: 0.5 10*3/uL (ref 0.1–1.0)
Monocytes Relative: 8 %
Neutro Abs: 4.4 10*3/uL (ref 1.7–7.7)
Neutrophils Relative %: 70 %
Platelets: 174 10*3/uL (ref 150–400)
RBC: 5.09 MIL/uL (ref 3.87–5.11)
RDW: 15 % (ref 11.5–15.5)
WBC: 6.4 10*3/uL (ref 4.0–10.5)
nRBC: 0 % (ref 0.0–0.2)

## 2022-01-26 MED ORDER — ZOLEDRONIC ACID 4 MG/5ML IV CONC
3.0000 mg | INTRAVENOUS | Status: DC
  Administered 2022-01-26: 3 mg via INTRAVENOUS
  Filled 2022-01-26: qty 3.75

## 2022-01-26 MED ORDER — HEPARIN SOD (PORK) LOCK FLUSH 100 UNIT/ML IV SOLN
500.0000 [IU] | Freq: Once | INTRAVENOUS | Status: AC | PRN
  Administered 2022-01-26: 500 [IU]
  Filled 2022-01-26: qty 5

## 2022-01-26 MED ORDER — SODIUM CHLORIDE 0.9 % IV SOLN
Freq: Once | INTRAVENOUS | Status: AC
  Filled 2022-01-26: qty 250

## 2022-01-26 MED ORDER — HEPARIN SOD (PORK) LOCK FLUSH 100 UNIT/ML IV SOLN
INTRAVENOUS | Status: AC
  Filled 2022-01-26: qty 5

## 2022-01-26 NOTE — Addendum Note (Signed)
Addended by: Drue Dun on: 01/26/2022 03:55 PM   Modules accepted: Orders

## 2022-01-26 NOTE — Progress Notes (Addendum)
Hematology/Oncology Consult Note [facility]  Telephone:([phone] [phone]  Patient Care Team: Corky Downs, MD as PCP - General (Internal Medicine) Rollene Rotunda, MD as PCP - Cardiology (Cardiology) Dorisann Frames, MD as Referring Physician (Endocrinology) Casimer Lanius, MD as Consulting Physician (Rheumatology) Carmina Miller, MD as Referring Physician (Radiation Oncology) Creig Hines, MD as Consulting Physician (Oncology) Griselda Miner, MD as Consulting Physician (General Surgery) Jim Like, RN as Registered Nurse Scarlett Presto, RN as Registered Nurse Janalyn Harder, MD as Consulting Physician (Dermatology)   Name of the patient: [name]  [id number]  [birth year]   Date of visit: 01/26/22  Diagnosis-  Invasive lobular carcinoma of the left breast pathological prognostic stage Ib pT2 pN2 acM0 ER/PR positive HER-2/neu negative s/p mastectomy and targeted node dissection    Chief complaint/ Reason for visit- routine follow-up of breast cancer on letrozole and to receive Zometa  Heme/Onc history: Patient is a 66 year old female with a past medical history significant for hypertension, CKD, diabetes, nonischemic cardiomyopathy among other medical problems.  She has not undergone a mammogram for several years.  Mammogram on October 2020 was done after she had a palpable area of concern in the left breast which showed an irregular mass with increased vascularity measuring 3.1 x 2.4 x 4.5 cm.  The nodularity extended towards the nipple measuring at least 5.4 cm.  Single abnormal lymph node in the left axilla with cortical thickness of 0.7 cm.  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     PET scan showed hypermetabolic left axillary lymph nodes which was reviewed at tumor board and  they were at least found to be 3-4.  Retroareolar left breast mass 2.5 x 3.2 cm in size.  No evidence of distant metastatic disease.  There were small to borderline enlarged retroperitoneal lymph nodes 10 mm in size with an SUV of 4.3.  Retroperitoneal lymph nodes will require follow-up in the future   MRI showed 2 sites of biopsy-proven malignancy in the lateral left breast at posterior and anterior depth.  There is non-mass enhancement spanning between the 2 sites.  Morphologically abnormal level 1 axillary lymph node consistent with biopsy-proven metastatic disease.   Final pathology showed 2 separate tumors one which was 4.5 cm and the other one was 4.4 cm in size.  Grade 2 ER/PR positive HER-2/neu negative with negative margins. 5 out of 11 lymph nodes were positive for malignancy.  Extranodal extension present.  Largest size of metastatic deposit 14 mm.  mpT2 mpN2   Patient is not a candidate for anthracycline-based chemotherapy per cardiology.  Adjuvant TC chemotherapy completed on 03/11/2020.  She completed adjuvant radiation therapy as well and started Arimidex which she could not tolerate and was switched to Aromasin on 06/19/2020.  Started letrozole in 2022.     Interval history- patient is tolerating letrozole well. She saw rheumatology who advised that AI is not likely contributing to her chronic joint pains. She has started new medications and her symptoms are stable. No new breast concerns and feels well otherwise. She denies other complaints at this time.  ECOG PS- 1 Pain scale- 10   Review of systems- Review of Systems  Constitutional:  Positive for malaise/fatigue. Negative for chills, fever and weight loss.  HENT:  Negative for congestion, ear discharge and nosebleeds.  Eyes:  Negative for blurred vision.  Respiratory:  Negative for cough, hemoptysis, sputum production, shortness of breath and wheezing.   Cardiovascular:  Negative for chest pain, palpitations, orthopnea and  claudication.  Gastrointestinal:  Negative for abdominal pain, blood in stool, constipation, diarrhea, heartburn, melena, nausea and vomiting.  Genitourinary:  Negative for dysuria, flank pain, frequency, hematuria and urgency.  Musculoskeletal:  Positive for joint pain. Negative for back pain and myalgias.  Skin:  Negative for rash.  Neurological:  Negative for dizziness, tingling, focal weakness, seizures, weakness and headaches.  Endo/Heme/Allergies:  Does not bruise/bleed easily.  Psychiatric/Behavioral:  Negative for depression and suicidal ideas. The patient does not have insomnia.      Allergies  Allergen Reactions   Hydralazine Swelling    Eye Swelling   Other Other (See Comments)    Unknown blood pressure medication caused face swelling     Past Medical History:  Diagnosis Date   Arthritis of both feet    Gout   Cancer ([place])    Breast Cancer on left   Chronic combined systolic and diastolic CHF (congestive heart failure) ([place])    a. Echo (06/2014):  Severe LVH, EF 20-25%, no RWMA, Gr 3 DD, trivial MR, mild LAE, mild RVE, PASP 38 mmHg .>> b. Echo 2/16 EF 50-55% //  // c. Echo 02/2019: EF 50-55, mod LVH, Gr 1 DD, mild LAE // Echocardiogram 02/2020: EF 60-65, no RWMA, mild LVH, Gr 1 DD, normal RVSF, RVSP 30.5    CKD (chronic kidney disease), stage III (HCC)    now stage 4   Diabetes mellitus type 2 in obese [facility])    Diverticular disease    Dyspnea    Family history of breast cancer    Family history of leukemia    Family history of lung cancer    Family history of testicular cancer    Family history of throat cancer    Fatty liver    History of GI diverticular bleed    Hyperlipidemia    Hyperphosphatemia    Hypertension    Hypertensive heart disease    a. Renal Artery Duplex (8/15):  no RAS   Iron deficiency anemia    NICM (nonischemic cardiomyopathy) ([place])    a. Nuclear (06/2014):  EF 32%, apical thinning, no definite scar or ischemia;  b. Echo (2/16):  Mild LVH, EF  50-55%, Gr 2 DD, no RWMA   Obesity    Personal history of chemotherapy    Personal history of radiation therapy    PONV (postoperative nausea and vomiting)    after D and C   Proteinuria    Psoriasis    PUD (peptic ulcer disease)    Secondary hyperparathyroidism, renal (HCC)    Tobacco abuse    UTI (lower urinary tract infection)    Vaginal dryness, menopausal    Vaginal yeast infection      Past Surgical History:  Procedure Laterality Date   BREAST BIOPSY Left 2020   positive    COLONOSCOPY     DILATION AND CURETTAGE OF UTERUS     MASTECTOMY Left 2020   MASTECTOMY W/ SENTINEL NODE BIOPSY Left 11/12/2019   MASTECTOMY W/ SENTINEL NODE BIOPSY Left 11/12/2019   Procedure: LEFT MASTECTOMY WITH SENTINEL LYMPH NODE BIOPSY AND TARGETED NODE DISSECTION;  Surgeon: Jovita Kussmaul, MD;  Location: [place];  Service: General;  Laterality: Left;   None     PORTA CATH INSERTION N/A 12/11/2019   Procedure: PORTA CATH INSERTION;  Surgeon: Katha Cabal, MD;  Location: [place] [facility];  Service: Cardiovascular;  Laterality: N/A;    Social History   Socioeconomic History   Marital status: Widowed    Spouse name: Not on file   Number of children: 2   Years of education: 7   Highest education level: Not on file  Occupational History   Not on file  Tobacco Use   Smoking status: Former    Types: Cigarettes    Quit date: 07/05/2014    Years since quitting: 7.5   Smokeless tobacco: Never  Vaping Use   Vaping Use: Never used  Substance and Sexual Activity   Alcohol use: No   Drug use: No   Sexual activity: Not Currently  Other Topics Concern   Not on file  Social History Narrative   Currently lives in a house with her husband.    Fun: Watch TV.   Denies religious beliefs effecting health care.    Social Determinants of Health   Financial Resource Strain: Not on file  Food Insecurity: Not on file  Transportation Needs: Not on file  Physical Activity: Not on file   Stress: Not on file  Social Connections: Not on file  Intimate Partner Violence: Not on file    Family History  Problem Relation Age of Onset   Heart attack Paternal Grandfather    Stroke Paternal Grandmother    Hypertension Paternal Grandmother    Kidney disease Mother    Diabetes Mother    Testicular cancer Father        diagnosed older than 76   Coronary artery disease Other    Hypertension Other    Breast cancer Sister        diagnosed late 72s, negative genetics   Leukemia Brother        chronic   Throat cancer Brother    Pneumonia Sister 2   Lung cancer Paternal Uncle        diagnosed over 62     Current Outpatient Medications:    acetaminophen (TYLENOL) 500 MG tablet, Take 1,000 mg by mouth every 6 (six) hours as needed for mild pain, moderate pain or headache. For pain, Disp: , Rfl:    allopurinol (ZYLOPRIM) 100 MG tablet, Take 200 mg by mouth daily., Disp: , Rfl:    aspirin 81 MG EC tablet, Take 1 tablet (81 mg total) by mouth daily., Disp: 30 tablet, Rfl:    atorvastatin (LIPITOR) 40 MG tablet, Take 1 tablet (40 mg total) by mouth daily at 6 PM., Disp: 90 tablet, Rfl: 3   betamethasone dipropionate 0.05 % cream, Apply topically 2 (two) times daily., Disp: 30 g, Rfl: 0   calcipotriene (DOVONOX) 6.767 % cream, 1 application, Disp: , Rfl:    calcitRIOL (ROCALTROL) 0.25 MCG capsule, Take by mouth., Disp: , Rfl:    calcium carbonate (OSCAL) 1500 (600 Ca) MG TABS tablet, Take 1 tablet by mouth 2 (two) times daily with a meal., Disp: , Rfl:    COSENTYX SENSOREADY PEN 150 MG/ML SOAJ, Inject into the skin., Disp: , Rfl:    furosemide (LASIX) 20 MG tablet, Take 40 mg by mouth 2 (two) times daily., Disp: , Rfl:    gabapentin (NEURONTIN) 100 MG capsule, TAKE 2 CAPSULES BY MOUTH 3 TIMES DAILY., Disp: 180 capsule, Rfl: 1   insulin aspart (NOVOLOG FLEXPEN) 100 UNIT/ML FlexPen, Inject 5 Units into the skin 3 (three) times daily with meals. Additional insulin (5 units PLUS) CBG 70 -  120: 0 units CBG 121 - 150: 1 unit CBG 151 - 200: 2 units CBG 201 - 250: 3 units CBG 251 - 350: 4 units CBG 351 - 400: 6 units, Disp: 15 mL, Rfl: 11   insulin glargine (LANTUS) 100 UNIT/ML injection, Inject 20 Units into the skin at bedtime. , Disp: , Rfl:    isosorbide mononitrate (IMDUR) 60 MG 24 hr tablet, TAKE 1 TABLET BY MOUTH EVERY DAY, Disp: 90 tablet, Rfl: 3   leflunomide (ARAVA) 20 MG tablet, Take 20 mg by mouth daily., Disp: , Rfl:    letrozole (FEMARA) 2.5 MG tablet, TAKE 1 TABLET BY MOUTH EVERY DAY, Disp: 30 tablet, Rfl: 3   lidocaine-prilocaine (EMLA) cream, Apply to affected area once, Disp: 30 g, Rfl: 3   metoprolol tartrate (LOPRESSOR) 25 MG tablet, TAKE 1 TABLET BY MOUTH TWICE A DAY, Disp: 180 tablet, Rfl: 3   telmisartan (MICARDIS) 20 MG tablet, Take 1 tablet (20 mg total) by mouth daily. Patient needs to keep March appointment for any future refills., Disp: 90 tablet, Rfl: 0   traMADol (ULTRAM) 50 MG tablet, 1 tablet as needed, Disp: , Rfl:    B-D ULTRAFINE III SHORT PEN 31G X 8 MM MISC, USE AS DIRECTED *EMERGENCY FILL* (Patient not taking: Reported on 01/26/2022), Disp: , Rfl: 0   blood glucose meter kit and supplies KIT, Dispense based on patient and insurance preference. Use up to four times daily as directed. (FOR ICD-9 250.00, 250.01). (Patient not taking: Reported on 01/26/2022), Disp: 1 each, Rfl: 0   Colchicine 0.6 MG CAPS, Take 0.6 mg by mouth every other day.  (Patient not taking: Reported on 01/26/2022), Disp: , Rfl:    ONE TOUCH ULTRA TEST test strip, 1 each by Other route 4 (four) times daily.  (Patient not taking: Reported on 01/26/2022), Disp: , Rfl: 1   ONETOUCH DELICA LANCETS 08M MISC, Inject 33 g into the skin 4 (four) times daily.  (Patient not taking: Reported on 01/26/2022), Disp: , Rfl: 1   senna (SENOKOT) 8.6 MG tablet, Take 1 tablet by mouth daily. (Patient not taking: Reported on 01/26/2022), Disp: , Rfl:   Physical exam:  Vitals:   01/26/22 1443 01/26/22 1507  BP:   115/84  Pulse:  66  Resp: 18 18  Temp:  97.8 F (36.6 C)  TempSrc:  Tympanic  SpO2:  96%  Weight: 190 lb (86.2 kg)    Physical Exam Constitutional:      General: She is not in acute distress.    Comments: accompanied  Cardiovascular:     Rate and Rhythm: Normal rate and regular rhythm.     Heart sounds: Normal heart sounds.  Pulmonary:     Effort: Pulmonary effort is normal.     Breath sounds: Normal breath sounds.  Abdominal:     General: Bowel sounds are normal.     Palpations: Abdomen is soft.  Musculoskeletal:        General: Deformity present.     Comments: cane  Skin:    General: Skin is warm and dry.  Neurological:     Mental Status: She is alert and oriented to person, place, and time.  Breast exam: declined  CMP Latest Ref Rng & Units 01/26/2022  Glucose 70 - 99 mg/dL 165(H)  BUN 8 - 23 mg/dL 43(H)  Creatinine 0.44 - 1.00 mg/dL 1.84(H)  Sodium 135 - 145 mmol/L 136  Potassium 3.5 - 5.1 mmol/L 4.1  Chloride 98 - 111 mmol/L 100  CO2 22 - 32 mmol/L 27  Calcium 8.9 - 10.3 mg/dL 8.9  Total Protein 6.5 - 8.1 g/dL 7.0  Total Bilirubin 0.3 - 1.2 mg/dL 0.7  Alkaline Phos 38 - 126 U/L 95  AST 15 - 41 U/L 21  ALT 0 - 44 U/L 19   CBC Latest Ref Rng & Units 01/26/2022  WBC 4.0 - 10.5 K/uL 6.4  Hemoglobin 12.0 - 15.0 g/dL 14.4  Hematocrit 36.0 - 46.0 % 45.2  Platelets 150 - 400 K/uL 174   Assessment and plan- Patient is a 66 y.o. female with pathological prognostic stage Ib invasive lobular carcinoma pT2 pN2 acM0 ER/PR positive HER-2/neu negative s/p left mastectomy and targeted node dissection.  She has completed 4 cycles of adjuvant TC chemotherapy And adjuvant radiation therapy.  She is here for for routine follow-up of breast cancer on letrozole and to receive her Zometa  Tolerating letrozole well. She has seen rheumatology who does not feel AI is contributing to her pain. She will continue letrozole. Labs reviewed and zcceptable for zometa; dose reduced d/t CKD to  $R'3mg'Ne$ . Continue calcium and vitamin D. Repeat bone density in October 2023. She will continue surveillance. Next mammogram in October 2023.   RTC in 6 months for labs, Janese Banks, zometa    Visit Diagnosis 1. Encounter for follow-up surveillance of breast cancer   2. Use of exemestane (Aromasin)   3. Long term (current) use of bisphosphonates     Beckey Rutter, DNP, AGNP-C [place] at [facility] [phone] (clinic) 01/26/2022

## 2022-01-26 NOTE — Patient Instructions (Signed)

## 2022-01-26 NOTE — Progress Notes (Signed)
1549: Per Beckey Rutter NP okay to proceed with 3 mg IV ZOmeta with Creatinine 1.84 and Calcium 8.9.

## 2022-02-06 ENCOUNTER — Other Ambulatory Visit: Payer: Self-pay | Admitting: Oncology

## 2022-02-12 ENCOUNTER — Other Ambulatory Visit: Payer: Self-pay | Admitting: Cardiology

## 2022-02-23 ENCOUNTER — Other Ambulatory Visit: Payer: Self-pay

## 2022-02-23 ENCOUNTER — Ambulatory Visit: Payer: PPO | Admitting: Physician Assistant

## 2022-02-23 ENCOUNTER — Encounter: Payer: Self-pay | Admitting: Physician Assistant

## 2022-02-23 VITALS — BP 120/80 | HR 56 | Ht 60.0 in | Wt 189.0 lb

## 2022-02-23 DIAGNOSIS — N1831 Chronic kidney disease, stage 3a: Secondary | ICD-10-CM | POA: Diagnosis not present

## 2022-02-23 DIAGNOSIS — I5042 Chronic combined systolic (congestive) and diastolic (congestive) heart failure: Secondary | ICD-10-CM | POA: Diagnosis not present

## 2022-02-23 DIAGNOSIS — I110 Hypertensive heart disease with heart failure: Secondary | ICD-10-CM

## 2022-02-23 DIAGNOSIS — E7800 Pure hypercholesterolemia, unspecified: Secondary | ICD-10-CM

## 2022-02-23 DIAGNOSIS — L4050 Arthropathic psoriasis, unspecified: Secondary | ICD-10-CM | POA: Diagnosis not present

## 2022-02-23 DIAGNOSIS — I5032 Chronic diastolic (congestive) heart failure: Secondary | ICD-10-CM | POA: Diagnosis not present

## 2022-02-23 NOTE — Assessment & Plan Note (Signed)
Continue follow-up with nephrology.  Recent creatinine stable. ?

## 2022-02-23 NOTE — Patient Instructions (Signed)
Medication Instructions:  ? ?Your physician recommends that you continue on your current medications as directed. Please refer to the Current Medication list given to you today. ? ? ?*If you need a refill on your cardiac medications before your next appointment, please call your pharmacy* ? ? ?Lab Work: ? ?TODAY!!!! LIPID ? ?If you have labs (blood work) drawn today and your tests are completely normal, you will receive your results only by: ?MyChart Message (if you have MyChart) OR ?A paper copy in the mail ?If you have any lab test that is abnormal or we need to change your treatment, we will call you to review the results. ? ?Follow-Up: ?At [facility], you and your health needs are our priority.  As part of our continuing mission to provide you with exceptional heart care, we have created designated Provider Care Teams.  These Care Teams include your primary Cardiologist (physician) and Advanced Practice Providers (APPs -  Physician Assistants and Nurse Practitioners) who all work together to provide you with the care you need, when you need it. ? ?We recommend signing up for the patient portal called "MyChart".  Sign up information is provided on this After Visit Summary.  MyChart is used to connect with patients for Virtual Visits (Telemedicine).  Patients are able to view lab/test results, encounter notes, upcoming appointments, etc.  Non-urgent messages can be sent to your provider as well.   ?To learn more about what you can do with MyChart, go to [email or web address].   ? ?Your next appointment:   ?1 year(s) ? ?The format for your next appointment:   ?In Person ? ?Provider:   ?Richardson Dopp, PA-C       ? ? ?Other Instructions ? ?Your physician wants you to follow-up in: 1 YEAR with Richardson Dopp, PA-C. You will receive a reminder letter in the mail two months in advance. If you don't receive a letter, please call our office to schedule the follow-up appointment. ? ?Please call your Rheumatologist to  discuss your URI symptoms since starting your Cosentyx. ?  ?

## 2022-02-23 NOTE — Assessment & Plan Note (Signed)
Blood pressure remains well controlled.  Continue furosemide 40 mg twice daily, isosorbide mononitrate 60 mg daily, metoprolol tartrate 25 mg twice daily, telmisartan 20 mg daily.  Follow-up 1 year. ?

## 2022-02-23 NOTE — Assessment & Plan Note (Signed)
Managed by rheumatology.  She notes upper respiratory tract infection symptoms since starting on secukinumab.  She has not had any fever or purulent sputum or hemoptysis.  I have asked her to contact her rheumatologist to discuss further management. ?

## 2022-02-23 NOTE — Assessment & Plan Note (Signed)
Her EF in 2015 was 20-25 and improved to normal. She has maintained normal EF since.  Her last echocardiogram in 2021 showed EF 60-65.  Her volume status appears stable.  Continue furosemide 40 mg twice daily.  Her GFR has ranged between 26 and 34.  I am not certain that we should attempt MRA, SGLT2 inhibitor.  Overall, she is clinically stable.  Volume status seems to be stable.  Follow-up in 1 year. ?

## 2022-02-23 NOTE — Progress Notes (Signed)
Cardiology Office Note:    Date:  02/23/2022   ID:  [name], DOB 1956/10/13, MRN [id number]  PCP:  Corky Downs, MD  Nephrologist: Dr. Glenna Fellows Endocrinologist: Dr. Talmage Nap Rheumatologist: Dr. Casimer Lanius  [facility] Providers Cardiologist:  Rollene Rotunda, MD Cardiology APP:  Kennon Rounds    Referring MD: Corky Downs, MD   Chief Complaint:  F/u for CHF, HTN    Patient Profile: Hypertension  RA ultrasound 07/2014: neg for RA stenosis  Combined Systolic and Diastolic CHF Non-ischemic cardiomyopathy  Admx in 06/2014 w/ acute HF in setting of HTN emergency  Echocardiogram (06/2014): EF 20-25 Echocardiogram 2016: EF 50-55, Gr 2 DD Echocardiogram 02/2019: EF 50-55, Gr 1 DD Echo 3/21: EF 60-65, GR 1 DD True allergy to Hydralazine  Intol to Carvedilol  Myoview 7/15:  No scar, No ischemia  Chronic kidney disease  Diabetes mellitus  Breast CA S/p L Mastectomy 10/2019 Chemotherapy >> Taxotere, Cytoxan, Aloxi, Fulphila   Prior CV studies: Echo 02/28/2020 EF 60-65, no RWMA, mild LVH, GR 1 DD, normal RVSF, RVSP 30.5, mild aortic valve sclerosis without stenosis   Echo 02/26/2019 EF 50-55, mod LVH, Gr 1 DD, normal RVSF, mild LAE   Echo (01/27/15):  Mild LVH, EF 50-55%, normal wall motion, grade 2 diastolic dysfunction   Echo (06/2014):   Severe LVH, EF 20-25%, no RWMA, Gr 3 DD, trivial MR, mild LAE, mild RVE, PASP 38 mmHg.   Nuclear (06/2014):   EF 32%, apical thinning, no definite scar or ischemia   History of Present Illness:   [name] is a 66 y.o. female with the above problem list.  She was last seen in 11/21 via Telemedicine.  She returns for f/u.  She is here with her son.  In Dec, a tree fell on her house.  Luckily, it fell in a way that there was no damage or injuries.  She is doing well without chest pain, shortness of breath, syncope, orthopnea.  Her leg edema is stable.   She notes URI symptoms since starting on Cosentyx.  I have asked her to f/u with her  Rheumatologist to determine management.        Past Medical History:  Diagnosis Date   Arthritis of both feet    Gout   Cancer (HCC)    Breast Cancer on left   Chronic combined systolic and diastolic CHF (congestive heart failure) (HCC)    a. Echo (06/2014):  Severe LVH, EF 20-25%, no RWMA, Gr 3 DD, trivial MR, mild LAE, mild RVE, PASP 38 mmHg .>> b. Echo 2/16 EF 50-55% //  // c. Echo 02/2019: EF 50-55, mod LVH, Gr 1 DD, mild LAE // Echocardiogram 02/2020: EF 60-65, no RWMA, mild LVH, Gr 1 DD, normal RVSF, RVSP 30.5    CKD (chronic kidney disease), stage III (HCC)    now stage 4   Diabetes mellitus type 2 in obese [facility])    Diverticular disease    Dyspnea    Family history of breast cancer    Family history of leukemia    Family history of lung cancer    Family history of testicular cancer    Family history of throat cancer    Fatty liver    History of GI diverticular bleed    Hyperlipidemia    Hyperphosphatemia    Hypertension    Hypertensive heart disease    a. Renal Artery Duplex (8/15):  no RAS   Iron deficiency anemia  NICM (nonischemic cardiomyopathy) ([place])    a. Nuclear (06/2014):  EF 32%, apical thinning, no definite scar or ischemia;  b. Echo (2/16):  Mild LVH, EF 50-55%, Gr 2 DD, no RWMA   Obesity    Personal history of chemotherapy    Personal history of radiation therapy    PONV (postoperative nausea and vomiting)    after D and C   Proteinuria    Psoriasis    PUD (peptic ulcer disease)    Secondary hyperparathyroidism, renal (HCC)    Tobacco abuse    UTI (lower urinary tract infection)    Vaginal dryness, menopausal    Vaginal yeast infection    Current Medications: Current Meds  Medication Sig   acetaminophen (TYLENOL) 500 MG tablet Take 1,000 mg by mouth every 6 (six) hours as needed for mild pain, moderate pain or headache. For pain   allopurinol (ZYLOPRIM) 100 MG tablet Take 200 mg by mouth daily.   aspirin 81 MG EC tablet Take 1 tablet (81 mg total) by  mouth daily.   atorvastatin (LIPITOR) 40 MG tablet TAKE 1 TABLET BY MOUTH DAILY AT 6 PM.   B-D ULTRAFINE III SHORT PEN 31G X 8 MM MISC    betamethasone dipropionate 0.05 % cream Apply topically 2 (two) times daily.   blood glucose meter kit and supplies KIT Dispense based on patient and insurance preference. Use up to four times daily as directed. (FOR ICD-9 250.00, 250.01).   calcipotriene (DOVONOX) 1.761 % cream 1 application   calcitRIOL (ROCALTROL) 0.25 MCG capsule Take 0.25 mcg by mouth daily.   calcium carbonate (OSCAL) 1500 (600 Ca) MG TABS tablet Take 1 tablet by mouth 2 (two) times daily with a meal.   Colchicine 0.6 MG CAPS Take 0.6 mg by mouth every other day.   COSENTYX SENSOREADY PEN 150 MG/ML SOAJ Inject 150 mg into the skin every 30 (thirty) days.   furosemide (LASIX) 20 MG tablet Take 40 mg by mouth 2 (two) times daily.   gabapentin (NEURONTIN) 100 MG capsule TAKE 2 CAPSULES BY MOUTH 3 TIMES DAILY.   insulin aspart (NOVOLOG FLEXPEN) 100 UNIT/ML FlexPen Inject 5 Units into the skin 3 (three) times daily with meals. Additional insulin (5 units PLUS) CBG 70 - 120: 0 units CBG 121 - 150: 1 unit CBG 151 - 200: 2 units CBG 201 - 250: 3 units CBG 251 - 350: 4 units CBG 351 - 400: 6 units   insulin glargine (LANTUS) 100 UNIT/ML injection Inject 20 Units into the skin at bedtime.    isosorbide mononitrate (IMDUR) 60 MG 24 hr tablet TAKE 1 TABLET BY MOUTH EVERY DAY   leflunomide (ARAVA) 20 MG tablet Take 20 mg by mouth daily.   letrozole (FEMARA) 2.5 MG tablet TAKE 1 TABLET BY MOUTH EVERY DAY   lidocaine-prilocaine (EMLA) cream Apply to affected area once   metoprolol tartrate (LOPRESSOR) 25 MG tablet TAKE 1 TABLET BY MOUTH TWICE A DAY   ONE TOUCH ULTRA TEST test strip 1 each by Other route 4 (four) times daily.   ONETOUCH DELICA LANCETS 60V MISC Inject 33 g into the skin 4 (four) times daily.   senna (SENOKOT) 8.6 MG tablet Take 1 tablet by mouth daily.   telmisartan (MICARDIS) 20  MG tablet Take 1 tablet (20 mg total) by mouth daily. Patient needs to keep March appointment for any future refills.   traMADol (ULTRAM) 50 MG tablet 1 tablet as needed    Allergies:   Hydralazine and  Other   Social History   Tobacco Use   Smoking status: Former    Types: Cigarettes    Quit date: 07/05/2014    Years since quitting: 7.6   Smokeless tobacco: Never  Vaping Use   Vaping Use: Never used  Substance Use Topics   Alcohol use: No   Drug use: No    Family Hx: The patient's family history includes Breast cancer in her sister; Coronary artery disease in an other family member; Diabetes in her mother; Heart attack in her paternal grandfather; Hypertension in her paternal grandmother and another family member; Kidney disease in her mother; Leukemia in her brother; Lung cancer in her paternal uncle; Pneumonia (age of onset: 2) in her sister; Stroke in her paternal grandmother; Testicular cancer in her father; Throat cancer in her brother.  Review of Systems  Constitutional: Negative for fever.  HENT:  Positive for congestion.   Respiratory:  Negative for hemoptysis. Sputum production: clear.    EKGs/Labs/Other Test Reviewed:    EKG:  EKG is   ordered today.  The ekg ordered today demonstrates sinus brady, HR 56, normal axis, TW inversions in 1, aVL, V5-6, QTc 465 ms, no change since last EKG  Recent Labs: 01/26/2022: ALT 19; BUN 43; Creatinine, Ser 1.84; Hemoglobin 14.4; Platelets 174; Potassium 4.1; Sodium 136   Recent Lipid Panel No results for input(s): CHOL, TRIG, HDL, VLDL, LDLCALC, LDLDIRECT in the last 8760 hours.   Risk Assessment/Calculations:         Physical Exam:    VS:  BP 120/80 (BP Location: Right Arm, Patient Position: Sitting, Cuff Size: Large)    Pulse (!) 56    Ht 5' (1.524 m)    Wt 189 lb (85.7 kg)    SpO2 95%    BMI 36.91 kg/m     Wt Readings from Last 3 Encounters:  02/23/22 189 lb (85.7 kg)  01/26/22 190 lb (86.2 kg)  07/21/21 194 lb 6.4 oz (88.2  kg)    Constitutional:      Appearance: Healthy appearance. Not in distress.  Neck:     Vascular: No JVR. JVD normal.  Pulmonary:     Effort: Pulmonary effort is normal.     Breath sounds: No wheezing. No rales.  Cardiovascular:     Normal rate. Regular rhythm. Normal S1. Normal S2.      Murmurs: There is no murmur.  Edema:    Pretibial: bilateral trace edema of the pretibial area. Abdominal:     Palpations: Abdomen is soft.  Skin:    General: Skin is warm and dry.  Neurological:     Mental Status: Alert and oriented to person, place and time.     Cranial Nerves: Cranial nerves are intact.         ASSESSMENT & PLAN:   Chronic heart failure with preserved ejection fraction ([place]) Her EF in 2015 was 20-25 and improved to normal. She has maintained normal EF since.  Her last echocardiogram in 2021 showed EF 60-65.  Her volume status appears stable.  Continue furosemide 40 mg twice daily.  Her GFR has ranged between 26 and 34.  I am not certain that we should attempt MRA, SGLT2 inhibitor.  Overall, she is clinically stable.  Volume status seems to be stable.  Follow-up in 1 year.  Hypertensive heart disease with chronic combined systolic and diastolic congestive heart failure ([place]) Blood pressure remains well controlled.  Continue furosemide 40 mg twice daily, isosorbide mononitrate 60 mg daily,  metoprolol tartrate 25 mg twice daily, telmisartan 20 mg daily.  Follow-up 1 year.  Stage 3a chronic kidney disease ([place]) Continue follow-up with nephrology.  Recent creatinine stable.  Hypercholesterolemia Recent ALT normal.  Obtain follow-up fasting lipid profile.  Continue atorvastatin 40 mg daily.  Psoriatic arthritis ([place]) Managed by rheumatology.  She notes upper respiratory tract infection symptoms since starting on secukinumab.  She has not had any fever or purulent sputum or hemoptysis.  I have asked her to contact her rheumatologist to discuss further management.          Dispo:   Return in about 1 year (around 02/24/2023) for Routine follow up in 1 year with Richardson Dopp, PA-C. .   Medication Adjustments/Labs and Tests Ordered: Current medicines are reviewed at length with the patient today.  Concerns regarding medicines are outlined above.  Tests Ordered: Orders Placed This Encounter  Procedures   Lipid Profile   EKG 12-Lead   Medication Changes: No orders of the defined types were placed in this encounter.  Signed, Richardson Dopp, PA-C  02/23/2022 2:31 PM    [facility] [address], [place], [place]  [address] Phone: [phone]; Fax: [phone]

## 2022-02-23 NOTE — Assessment & Plan Note (Signed)
Recent ALT normal.  Obtain follow-up fasting lipid profile.  Continue atorvastatin 40 mg daily. ?

## 2022-02-24 LAB — LIPID PANEL
Chol/HDL Ratio: 4.8 ratio — ABNORMAL HIGH (ref 0.0–4.4)
Cholesterol, Total: 171 mg/dL (ref 100–199)
HDL: 36 mg/dL — ABNORMAL LOW (ref >39)
LDL Chol Calc (NIH): 87 mg/dL (ref 0–99)
Triglycerides: 287 mg/dL — ABNORMAL HIGH (ref 0–149)
VLDL Cholesterol Cal: 48 mg/dL — ABNORMAL HIGH (ref 5–40)

## 2022-02-26 ENCOUNTER — Telehealth: Payer: Self-pay | Admitting: *Deleted

## 2022-02-26 ENCOUNTER — Other Ambulatory Visit: Payer: Self-pay | Admitting: Physician Assistant

## 2022-02-26 DIAGNOSIS — Z79899 Other long term (current) drug therapy: Secondary | ICD-10-CM

## 2022-02-26 NOTE — Telephone Encounter (Signed)
-----   Message from Liliane Shi, [place] sent at 02/24/2022  5:20 PM EST ----- ?LDL optimal.  Triglycerides elevated. ?PLAN:  ?-Continue current medications ?-Work on diet to reduce triglycerides ?-Repeat fasting lipids in 6 months ?Richardson Dopp, PA-C    ?02/24/2022 5:19 PM   ?

## 2022-05-16 ENCOUNTER — Other Ambulatory Visit: Payer: Self-pay | Admitting: Physician Assistant

## 2022-06-01 ENCOUNTER — Other Ambulatory Visit: Payer: Self-pay | Admitting: Oncology

## 2022-06-01 ENCOUNTER — Other Ambulatory Visit: Payer: Self-pay | Admitting: Nurse Practitioner

## 2022-06-01 ENCOUNTER — Other Ambulatory Visit: Payer: Self-pay | Admitting: Dermatology

## 2022-06-01 MED ORDER — LETROZOLE 2.5 MG PO TABS: 2.5000 mg | ORAL_TABLET | Freq: Every day | ORAL | 1 refills | Status: DC

## 2022-06-17 ENCOUNTER — Ambulatory Visit
Admission: RE | Admit: 2022-06-17 | Discharge: 2022-06-17 | Disposition: A | Discharge status: Home / Self Care | Payer: PPO | Source: Ambulatory Visit | Attending: Radiation Oncology | Admitting: Radiation Oncology

## 2022-06-17 ENCOUNTER — Encounter: Payer: Self-pay | Admitting: Radiation Oncology

## 2022-06-17 VITALS — BP 120/80 | HR 62 | Temp 98.2°F | Resp 16 | Wt 181.3 lb

## 2022-06-17 DIAGNOSIS — C50812 Malignant neoplasm of overlapping sites of left female breast: Secondary | ICD-10-CM

## 2022-06-17 DIAGNOSIS — Z170 Estrogen receptor positive status [ER+]: Secondary | ICD-10-CM

## 2022-06-17 DIAGNOSIS — Z08 Encounter for follow-up examination after completed treatment for malignant neoplasm: Secondary | ICD-10-CM | POA: Diagnosis not present

## 2022-06-17 DIAGNOSIS — Z923 Personal history of irradiation: Secondary | ICD-10-CM | POA: Diagnosis not present

## 2022-06-17 DIAGNOSIS — Z853 Personal history of malignant neoplasm of breast: Secondary | ICD-10-CM | POA: Insufficient documentation

## 2022-06-17 NOTE — Progress Notes (Signed)
Radiation Oncology Follow up Note  Name: [name]   Date:   06/17/2022 MRN:  [id number] DOB: July 15, 1956    This 66 y.o. female presents to the clinic today for 2-year follow-up status post chest wall radiation to her left chest wall and peripheral lymphatics for stage Ib (T2 N2 M0 ER/PR positive invasive mammary carcinoma status post left modified radical mastectomy REFERRING PROVIDER: [name], MD  HPI: Patient is a 66 year old female now at 2 years having completed radiation therapy to her left chest wall and peripheral lymphatics for stage Ib ER PR positive invasive mammary carcinoma.  Seen today in routine follow-up she is doing well specifically denies any new masses or nodularity or chest wall bone pain or cough..  She had a mammogram of her right breast back in October which I have reviewed was BI-RADS 1 negative.  She is currently on Femara tolerating it well without side effect.  COMPLICATIONS OF TREATMENT: none  FOLLOW UP COMPLIANCE: keeps appointments   PHYSICAL EXAM:  BP 120/80 (BP Location: Right Arm, Patient Position: Sitting, Cuff Size: Large)   Pulse 62   Temp 98.2 F (36.8 C) (Tympanic)   Resp 16   Wt 181 lb 4.8 oz (82.2 kg)   BMI 35.41 kg/m  Patient is status post left modified radical mastectomy chest wall has no evidence of mass or nodularity.  Right breast is free of dominant mass no axillary or supraclavicular adenopathy is identified.  No evidence of lymphedema in her left upper extremity is noted.  Well-developed well-nourished patient in NAD. HEENT reveals PERLA, EOMI, discs not visualized.  Oral cavity is clear. No oral mucosal lesions are identified. Neck is clear without evidence of cervical or supraclavicular adenopathy. Lungs are clear to A&P. Cardiac examination is essentially unremarkable with regular rate and rhythm without murmur rub or thrill. Abdomen is benign with no organomegaly or masses noted. Motor sensory and DTR levels are equal and symmetric  in the upper and lower extremities. Cranial nerves II through XII are grossly intact. Proprioception is intact. No peripheral adenopathy or edema is identified. No motor or sensory levels are noted. Crude visual fields are within normal range.  RADIOLOGY RESULTS: Mammogram reviewed compatible with above-stated findings  PLAN: Present time patient is now out over 2 years with no evidence of disease I am pleased with her overall progress.  I Georgina Peer turn follow-up care over to medical oncology.  Patient is to call at anytime with any concerns.  I would like to take this opportunity to thank you for allowing me to participate in the care of your patient.Noreene Filbert, MD

## 2022-07-12 ENCOUNTER — Other Ambulatory Visit: Payer: Self-pay

## 2022-08-08 ENCOUNTER — Other Ambulatory Visit: Payer: Self-pay | Admitting: *Deleted

## 2022-08-09 ENCOUNTER — Inpatient Hospital Stay (HOSPITAL_BASED_OUTPATIENT_CLINIC_OR_DEPARTMENT_OTHER): Payer: PPO | Admitting: Oncology

## 2022-08-09 ENCOUNTER — Inpatient Hospital Stay: Payer: PPO

## 2022-08-09 ENCOUNTER — Encounter: Payer: Self-pay | Admitting: Oncology

## 2022-08-09 ENCOUNTER — Inpatient Hospital Stay: Payer: PPO | Attending: Oncology

## 2022-08-09 VITALS — BP 130/90 | HR 71 | Temp 99.1°F | Resp 16 | Ht 60.0 in | Wt 190.8 lb

## 2022-08-09 DIAGNOSIS — Z08 Encounter for follow-up examination after completed treatment for malignant neoplasm: Secondary | ICD-10-CM

## 2022-08-09 DIAGNOSIS — Z79899 Other long term (current) drug therapy: Secondary | ICD-10-CM | POA: Insufficient documentation

## 2022-08-09 DIAGNOSIS — Z79811 Long term (current) use of aromatase inhibitors: Secondary | ICD-10-CM | POA: Diagnosis not present

## 2022-08-09 DIAGNOSIS — Z95828 Presence of other vascular implants and grafts: Secondary | ICD-10-CM

## 2022-08-09 DIAGNOSIS — Z801 Family history of malignant neoplasm of trachea, bronchus and lung: Secondary | ICD-10-CM | POA: Diagnosis not present

## 2022-08-09 DIAGNOSIS — Z8043 Family history of malignant neoplasm of testis: Secondary | ICD-10-CM | POA: Diagnosis not present

## 2022-08-09 DIAGNOSIS — C50812 Malignant neoplasm of overlapping sites of left female breast: Secondary | ICD-10-CM

## 2022-08-09 DIAGNOSIS — Z5181 Encounter for therapeutic drug level monitoring: Secondary | ICD-10-CM | POA: Diagnosis not present

## 2022-08-09 DIAGNOSIS — Z7982 Long term (current) use of aspirin: Secondary | ICD-10-CM | POA: Diagnosis not present

## 2022-08-09 DIAGNOSIS — I5042 Chronic combined systolic (congestive) and diastolic (congestive) heart failure: Secondary | ICD-10-CM | POA: Insufficient documentation

## 2022-08-09 DIAGNOSIS — Z7983 Long term (current) use of bisphosphonates: Secondary | ICD-10-CM

## 2022-08-09 DIAGNOSIS — Z806 Family history of leukemia: Secondary | ICD-10-CM | POA: Diagnosis not present

## 2022-08-09 DIAGNOSIS — E1122 Type 2 diabetes mellitus with diabetic chronic kidney disease: Secondary | ICD-10-CM | POA: Diagnosis not present

## 2022-08-09 DIAGNOSIS — Z803 Family history of malignant neoplasm of breast: Secondary | ICD-10-CM | POA: Insufficient documentation

## 2022-08-09 DIAGNOSIS — Z9012 Acquired absence of left breast and nipple: Secondary | ICD-10-CM | POA: Insufficient documentation

## 2022-08-09 DIAGNOSIS — Z87891 Personal history of nicotine dependence: Secondary | ICD-10-CM | POA: Insufficient documentation

## 2022-08-09 DIAGNOSIS — Z170 Estrogen receptor positive status [ER+]: Secondary | ICD-10-CM | POA: Insufficient documentation

## 2022-08-09 DIAGNOSIS — I130 Hypertensive heart and chronic kidney disease with heart failure and stage 1 through stage 4 chronic kidney disease, or unspecified chronic kidney disease: Secondary | ICD-10-CM | POA: Diagnosis not present

## 2022-08-09 DIAGNOSIS — C50912 Malignant neoplasm of unspecified site of left female breast: Secondary | ICD-10-CM | POA: Insufficient documentation

## 2022-08-09 DIAGNOSIS — Z9221 Personal history of antineoplastic chemotherapy: Secondary | ICD-10-CM | POA: Insufficient documentation

## 2022-08-09 DIAGNOSIS — N183 Chronic kidney disease, stage 3 unspecified: Secondary | ICD-10-CM | POA: Diagnosis not present

## 2022-08-09 DIAGNOSIS — Z923 Personal history of irradiation: Secondary | ICD-10-CM | POA: Diagnosis not present

## 2022-08-09 DIAGNOSIS — Z7969 Long term (current) use of other immunomodulators and immunosuppressants: Secondary | ICD-10-CM | POA: Insufficient documentation

## 2022-08-09 DIAGNOSIS — I428 Other cardiomyopathies: Secondary | ICD-10-CM | POA: Insufficient documentation

## 2022-08-09 DIAGNOSIS — Z853 Personal history of malignant neoplasm of breast: Secondary | ICD-10-CM | POA: Diagnosis not present

## 2022-08-09 DIAGNOSIS — Z794 Long term (current) use of insulin: Secondary | ICD-10-CM | POA: Insufficient documentation

## 2022-08-09 DIAGNOSIS — R590 Localized enlarged lymph nodes: Secondary | ICD-10-CM | POA: Insufficient documentation

## 2022-08-09 LAB — COMPREHENSIVE METABOLIC PANEL
ALT: 22 U/L (ref 0–44)
AST: 22 U/L (ref 15–41)
Albumin: 4 g/dL (ref 3.5–5.0)
Alkaline Phosphatase: 78 U/L (ref 38–126)
Anion gap: 10 (ref 5–15)
BUN: 46 mg/dL — ABNORMAL HIGH (ref 8–23)
CO2: 26 mmol/L (ref 22–32)
Calcium: 9.5 mg/dL (ref 8.9–10.3)
Chloride: 102 mmol/L (ref 98–111)
Creatinine, Ser: 1.71 mg/dL — ABNORMAL HIGH (ref 0.44–1.00)
GFR, Estimated: 32 mL/min — ABNORMAL LOW (ref >60)
Glucose, Bld: 192 mg/dL — ABNORMAL HIGH (ref 70–99)
Potassium: 3.7 mmol/L (ref 3.5–5.1)
Sodium: 138 mmol/L (ref 135–145)
Total Bilirubin: 0.7 mg/dL (ref 0.3–1.2)
Total Protein: 7.3 g/dL (ref 6.5–8.1)

## 2022-08-09 LAB — CBC WITH DIFFERENTIAL/PLATELET
Abs Immature Granulocytes: 0.01 10*3/uL (ref 0.00–0.07)
Basophils Absolute: 0.1 10*3/uL (ref 0.0–0.1)
Basophils Relative: 1 %
Eosinophils Absolute: 0.4 10*3/uL (ref 0.0–0.5)
Eosinophils Relative: 8 %
HCT: 43.9 % (ref 36.0–46.0)
Hemoglobin: 14.1 g/dL (ref 12.0–15.0)
Immature Granulocytes: 0 %
Lymphocytes Relative: 21 %
Lymphs Abs: 1.1 10*3/uL (ref 0.7–4.0)
MCH: 28.2 pg (ref 26.0–34.0)
MCHC: 32.1 g/dL (ref 30.0–36.0)
MCV: 87.8 fL (ref 80.0–100.0)
Monocytes Absolute: 0.4 10*3/uL (ref 0.1–1.0)
Monocytes Relative: 8 %
Neutro Abs: 3.1 10*3/uL (ref 1.7–7.7)
Neutrophils Relative %: 62 %
Platelets: 174 10*3/uL (ref 150–400)
RBC: 5 MIL/uL (ref 3.87–5.11)
RDW: 15.1 % (ref 11.5–15.5)
WBC: 5.1 10*3/uL (ref 4.0–10.5)
nRBC: 0 % (ref 0.0–0.2)

## 2022-08-09 MED ORDER — HEPARIN SOD (PORK) LOCK FLUSH 100 UNIT/ML IV SOLN
500.0000 [IU] | Freq: Once | INTRAVENOUS | Status: AC
  Administered 2022-08-09: 500 [IU] via INTRAVENOUS
  Filled 2022-08-09: qty 5

## 2022-08-09 MED ORDER — ZOLEDRONIC ACID 4 MG/5ML IV CONC
3.0000 mg | INTRAVENOUS | Status: DC
  Administered 2022-08-09: 3 mg via INTRAVENOUS
  Filled 2022-08-09: qty 3.75

## 2022-08-09 NOTE — Progress Notes (Signed)
Hematology/Oncology Consult note [facility]  [phone] [phone]  Patient Care Team: Corky Downs, MD as PCP - General (Internal Medicine) Rollene Rotunda, MD as PCP - Cardiology (Cardiology) Dorisann Frames, MD as Referring Physician (Endocrinology) Casimer Lanius, MD as Consulting Physician (Rheumatology) Carmina Miller, MD as Referring Physician (Radiation Oncology) Creig Hines, MD as Consulting Physician (Oncology) Griselda Miner, MD as Consulting Physician (General Surgery) Jim Like, RN as Registered Nurse Scarlett Presto, RN (Inactive) as Registered Nurse Janalyn Harder, MD as Consulting Physician (Dermatology) Kennon Rounds as Physician Assistant (Cardiology)   Name of the patient: [name]  [id number]  26-Jun-1956   Date of visit: 08/09/22  Diagnosis- Invasive lobular carcinoma of the left breast pathological prognostic stage Ib pT2 pN2 acM0 ER/PR positive HER-2/neu negative s/p mastectomy and targeted node dissection   Chief complaint/ Reason for visit-routine follow-up of breast cancer on letrozole and to receive Zometa  Heme/Onc history:  Patient is a 66 year old female with a past medical history significant for hypertension, CKD, diabetes, nonischemic cardiomyopathy among other medical problems.  She has not undergone a mammogram for several years.  Mammogram on October 2020 was done after she had a palpable area of concern in the left breast which showed an irregular mass with increased vascularity measuring 3.1 x 2.4 x 4.5 cm.  The nodularity extended towards the nipple measuring at least 5.4 cm.  Single abnormal lymph node in the left axilla with cortical thickness of 0.7 cm.  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     PET scan showed  hypermetabolic left axillary lymph nodes which was reviewed at tumor board and they were at least found to be 3-4.  Retroareolar left breast mass 2.5 x 3.2 cm in size.  No evidence of distant metastatic disease.  There were small to borderline enlarged retroperitoneal lymph nodes 10 mm in size with an SUV of 4.3.  Retroperitoneal lymph nodes will require follow-up in the future   MRI showed 2 sites of biopsy-proven malignancy in the lateral left breast at posterior and anterior depth.  There is non-mass enhancement spanning between the 2 sites.  Morphologically abnormal level 1 axillary lymph node consistent with biopsy-proven metastatic disease.   Final pathology showed 2 separate tumors one which was 4.5 cm and the other one was 4.4 cm in size.  Grade 2 ER/PR positive HER-2/neu negative with negative margins. 5 out of 11 lymph nodes were positive for malignancy.  Extranodal extension present.  Largest size of metastatic deposit 14 mm.  mpT2 mpN2   Patient is not a candidate for anthracycline-based chemotherapy per cardiology.  Adjuvant TC chemotherapy completed on 03/11/2020.  She completed adjuvant radiation therapy as well and started Arimidex which she could not tolerate and was to Aromasin on 06/19/2020.  She was subsequently switched to letrozole    Interval history-patient continues to have pain in her bilateral knees and follows up with rheumatology.  Her pain has been attributed to psoriatic arthritis for which she is receiving injections.  Otherwise tolerating letrozole well.  She reports an area of concern/pain in her right breast.  ECOG PS- 2 Pain scale- 4   Review of systems- Review of Systems  Constitutional:  Positive for malaise/fatigue. Negative for chills, fever and weight loss.  HENT:  Negative for  congestion, ear discharge and nosebleeds.   Eyes:  Negative for blurred vision.  Respiratory:  Negative for cough, hemoptysis, sputum production, shortness of breath and wheezing.    Cardiovascular:  Negative for chest pain, palpitations, orthopnea and claudication.  Gastrointestinal:  Negative for abdominal pain, blood in stool, constipation, diarrhea, heartburn, melena, nausea and vomiting.  Genitourinary:  Negative for dysuria, flank pain, frequency, hematuria and urgency.  Musculoskeletal:  Positive for joint pain. Negative for back pain and myalgias.  Skin:  Negative for rash.  Neurological:  Negative for dizziness, tingling, focal weakness, seizures, weakness and headaches.  Endo/Heme/Allergies:  Does not bruise/bleed easily.  Psychiatric/Behavioral:  Negative for depression and suicidal ideas. The patient does not have insomnia.       Allergies  Allergen Reactions   Hydralazine Swelling    Eye Swelling   Other Other (See Comments)    Unknown blood pressure medication caused face swelling     Past Medical History:  Diagnosis Date   Arthritis of both feet    Gout   Cancer ([place])    Breast Cancer on left   Chronic combined systolic and diastolic CHF (congestive heart failure) ([place])    a. Echo (06/2014):  Severe LVH, EF 20-25%, no RWMA, Gr 3 DD, trivial MR, mild LAE, mild RVE, PASP 38 mmHg .>> b. Echo 2/16 EF 50-55% //  // c. Echo 02/2019: EF 50-55, mod LVH, Gr 1 DD, mild LAE // Echocardiogram 02/2020: EF 60-65, no RWMA, mild LVH, Gr 1 DD, normal RVSF, RVSP 30.5    CKD (chronic kidney disease), stage III (HCC)    now stage 4   Diabetes mellitus type 2 in obese [facility])    Diverticular disease    Dyspnea    Family history of breast cancer    Family history of leukemia    Family history of lung cancer    Family history of testicular cancer    Family history of throat cancer    Fatty liver    History of GI diverticular bleed    Hyperlipidemia    Hyperphosphatemia    Hypertension    Hypertensive heart disease    a. Renal Artery Duplex (8/15):  no RAS   Iron deficiency anemia    NICM (nonischemic cardiomyopathy) ([place])    a. Nuclear (06/2014):  EF 32%,  apical thinning, no definite scar or ischemia;  b. Echo (2/16):  Mild LVH, EF 50-55%, Gr 2 DD, no RWMA   Obesity    Personal history of chemotherapy    Personal history of radiation therapy    PONV (postoperative nausea and vomiting)    after D and C   Proteinuria    Psoriasis    PUD (peptic ulcer disease)    Secondary hyperparathyroidism, renal (HCC)    Tobacco abuse    UTI (lower urinary tract infection)    Vaginal dryness, menopausal    Vaginal yeast infection      Past Surgical History:  Procedure Laterality Date   BREAST BIOPSY Left 2020   positive    COLONOSCOPY     DILATION AND CURETTAGE OF UTERUS     MASTECTOMY Left 2020   MASTECTOMY W/ SENTINEL NODE BIOPSY Left 11/12/2019   MASTECTOMY W/ SENTINEL NODE BIOPSY Left 11/12/2019   Procedure: LEFT MASTECTOMY WITH SENTINEL LYMPH NODE BIOPSY AND TARGETED NODE DISSECTION;  Surgeon: Jovita Kussmaul, MD;  Location: [place];  Service: General;  Laterality: Left;   None     PORTA CATH INSERTION N/A  12/11/2019   Procedure: PORTA CATH INSERTION;  Surgeon: Katha Cabal, MD;  Location: [place] [facility];  Service: Cardiovascular;  Laterality: N/A;    Social History   Socioeconomic History   Marital status: Widowed    Spouse name: Not on file   Number of children: 2   Years of education: 7   Highest education level: Not on file  Occupational History   Not on file  Tobacco Use   Smoking status: Former    Types: Cigarettes    Quit date: 07/05/2014    Years since quitting: 8.1   Smokeless tobacco: Never  Vaping Use   Vaping Use: Never used  Substance and Sexual Activity   Alcohol use: No   Drug use: No   Sexual activity: Not Currently  Other Topics Concern   Not on file  Social History Narrative   Currently lives in a house with her husband.    Fun: Watch TV.   Denies religious beliefs effecting health care.    Social Determinants of Health   Financial Resource Strain: Not on file  Food Insecurity: Not on file   Transportation Needs: Not on file  Physical Activity: Not on file  Stress: Not on file  Social Connections: Not on file  Intimate Partner Violence: Not on file    Family History  Problem Relation Age of Onset   Heart attack Paternal Grandfather    Stroke Paternal Grandmother    Hypertension Paternal Grandmother    Kidney disease Mother    Diabetes Mother    Testicular cancer Father        diagnosed older than 50   Coronary artery disease Other    Hypertension Other    Breast cancer Sister        diagnosed late 15s, negative genetics   Leukemia Brother        chronic   Throat cancer Brother    Pneumonia Sister 2   Lung cancer Paternal Uncle        diagnosed over 77     Current Outpatient Medications:    acetaminophen (TYLENOL) 500 MG tablet, Take 1,000 mg by mouth every 6 (six) hours as needed for mild pain, moderate pain or headache. For pain, Disp: , Rfl:    allopurinol (ZYLOPRIM) 100 MG tablet, Take 200 mg by mouth daily., Disp: , Rfl:    aspirin 81 MG EC tablet, Take 1 tablet (81 mg total) by mouth daily., Disp: 30 tablet, Rfl:    atorvastatin (LIPITOR) 40 MG tablet, TAKE 1 TABLET BY MOUTH DAILY AT 6 PM., Disp: 90 tablet, Rfl: 3   B-D ULTRAFINE III SHORT PEN 31G X 8 MM MISC, , Disp: , Rfl: 0   blood glucose meter kit and supplies KIT, Dispense based on patient and insurance preference. Use up to four times daily as directed. (FOR ICD-9 250.00, 250.01)., Disp: 1 each, Rfl: 0   calcitRIOL (ROCALTROL) 0.25 MCG capsule, Take 0.25 mcg by mouth daily., Disp: , Rfl:    calcium carbonate (OSCAL) 1500 (600 Ca) MG TABS tablet, Take 1 tablet by mouth 2 (two) times daily with a meal., Disp: , Rfl:    Colchicine 0.6 MG CAPS, Take 0.6 mg by mouth every other day., Disp: , Rfl:    COSENTYX SENSOREADY PEN 150 MG/ML SOAJ, Inject 150 mg into the skin every 30 (thirty) days., Disp: , Rfl:    furosemide (LASIX) 20 MG tablet, Take 40 mg by mouth 2 (two) times daily., Disp: ,  Rfl:     gabapentin (NEURONTIN) 100 MG capsule, TAKE 2 CAPSULES BY MOUTH 3 TIMES A DAY, Disp: 180 capsule, Rfl: 1   insulin aspart (NOVOLOG FLEXPEN) 100 UNIT/ML FlexPen, Inject 5 Units into the skin 3 (three) times daily with meals. Additional insulin (5 units PLUS) CBG 70 - 120: 0 units CBG 121 - 150: 1 unit CBG 151 - 200: 2 units CBG 201 - 250: 3 units CBG 251 - 350: 4 units CBG 351 - 400: 6 units, Disp: 15 mL, Rfl: 11   insulin glargine (LANTUS) 100 UNIT/ML injection, Inject 25 Units into the skin at bedtime., Disp: , Rfl:    isosorbide mononitrate (IMDUR) 60 MG 24 hr tablet, TAKE 1 TABLET BY MOUTH EVERY DAY, Disp: 90 tablet, Rfl: 3   leflunomide (ARAVA) 20 MG tablet, Take 20 mg by mouth daily., Disp: , Rfl:    letrozole (FEMARA) 2.5 MG tablet, Take 1 tablet (2.5 mg total) by mouth daily., Disp: 90 tablet, Rfl: 1   lidocaine-prilocaine (EMLA) cream, Apply to affected area once, Disp: 30 g, Rfl: 3   metoprolol tartrate (LOPRESSOR) 25 MG tablet, TAKE 1 TABLET BY MOUTH TWICE A DAY, Disp: 180 tablet, Rfl: 3   ONE TOUCH ULTRA TEST test strip, 1 each by Other route 4 (four) times daily., Disp: , Rfl: 1   ONETOUCH DELICA LANCETS 61Y MISC, Inject 33 g into the skin 4 (four) times daily., Disp: , Rfl: 1   senna (SENOKOT) 8.6 MG tablet, Take 1 tablet by mouth daily., Disp: , Rfl:    telmisartan (MICARDIS) 20 MG tablet, Take 1 tablet (20 mg total) by mouth daily., Disp: 90 tablet, Rfl: 3   traMADol (ULTRAM) 50 MG tablet, 1 tablet as needed, Disp: , Rfl:    betamethasone dipropionate 0.05 % cream, Apply topically 2 (two) times daily. (Patient not taking: Reported on 08/09/2022), Disp: 30 g, Rfl: 0 No current facility-administered medications for this visit.  Facility-Administered Medications Ordered in Other Visits:    zoledronic acid (ZOMETA) 3 mg in sodium chloride 0.9 % 100 mL IVPB, 3 mg, Intravenous, Q6 months, [name], MD, Stopped at 08/09/22 1434  Physical exam:  Vitals:   08/09/22 1334  BP: (!)  130/90  Pulse: 71  Resp: 16  Temp: 99.1 F (37.3 C)  TempSrc: Oral  Weight: 190 lb 12.8 oz (86.5 kg)  Height: 5' (1.524 m)   Physical Exam Constitutional:      General: She is not in acute distress. Cardiovascular:     Rate and Rhythm: Normal rate and regular rhythm.     Heart sounds: Normal heart sounds.  Pulmonary:     Effort: Pulmonary effort is normal.     Breath sounds: Normal breath sounds.  Skin:    General: Skin is warm and dry.  Neurological:     Mental Status: She is alert and oriented to person, place, and time.   Breast exam: Patient is s/p left mastectomy without reconstruction.  No evidence of chest wall recurrence.  No palpable bilateral axillary adenopathy.  No palpable masses in the right breast.     Latest Ref Rng & Units 08/09/2022    1:02 PM  CMP  Glucose 70 - 99 mg/dL 192   BUN 8 - 23 mg/dL 46   Creatinine 0.44 - 1.00 mg/dL 1.71   Sodium 135 - 145 mmol/L 138   Potassium 3.5 - 5.1 mmol/L 3.7   Chloride 98 - 111 mmol/L 102   CO2 22 -  32 mmol/L 26   Calcium 8.9 - 10.3 mg/dL 9.5   Total Protein 6.5 - 8.1 g/dL 7.3   Total Bilirubin 0.3 - 1.2 mg/dL 0.7   Alkaline Phos 38 - 126 U/L 78   AST 15 - 41 U/L 22   ALT 0 - 44 U/L 22       Latest Ref Rng & Units 08/09/2022    1:02 PM  CBC  WBC 4.0 - 10.5 K/uL 5.1   Hemoglobin 12.0 - 15.0 g/dL 14.1   Hematocrit 36.0 - 46.0 % 43.9   Platelets 150 - 400 K/uL 174      Assessment and plan- Patient is a 66 y.o. female with pathological prognostic stage Ib invasive lobular carcinoma pT2 pN2 acM0 ER/PR positive HER-2/neu negative s/p left mastectomy and targeted node dissection.  She has completed 4 cycles of adjuvant TC chemotherapy And adjuvant radiation therapy.  SHe is here for routine follow-up of breast cancer and to receive Zometa  Breast cancer: She started taking endocrine therapy in July 2021.  She will continue to take that at least for 5 years if not 10 years.  She is currently on letrozole.  She does  have high risk breast cancer and therefore receiving adjuvant Zometa every 6 months and will receive her next dose today.  She needs that for 1 more year.  I will also plan to repeat her bone density scan prior to her next visit with me  Arthralgias: Possibly secondary to psoriatic arthritis and she follows up with rheumatology  I do not see any area of concern in her right breast.  She will be due for a routine screening mammogram in October 2023 which I will schedule  Calcium levels acceptable to proceed with Zometa today   Visit Diagnosis 1. Encounter for follow-up surveillance of breast cancer      Dr. Randa Evens, MD, MPH [facility] at [facility] [phone] 08/09/2022 3:56 PM

## 2022-08-09 NOTE — Patient Instructions (Signed)
[facility] [place]  Discharge Instructions: Thank you for choosing [facility] to provide your oncology and hematology care.  If you have a lab appointment with the Cancer Center, please go directly to the Cancer Center and check in at the registration area.  Wear comfortable clothing and clothing appropriate for easy access to any Portacath or PICC line.   We strive to give you quality time with your provider. You may need to reschedule your appointment if you arrive late (15 or more minutes).  Arriving late affects you and other patients whose appointments are after yours.  Also, if you miss three or more appointments without notifying the office, you may be dismissed from the clinic at the provider's discretion.      For prescription refill requests, have your pharmacy contact our office and allow 72 hours for refills to be completed.    Today you received the following chemotherapy and/or immunotherapy agents ZOMETA      To help prevent nausea and vomiting after your treatment, we encourage you to take your nausea medication as directed.  BELOW ARE SYMPTOMS THAT SHOULD BE REPORTED IMMEDIATELY: *FEVER GREATER THAN 100.4 F (38 C) OR HIGHER *CHILLS OR SWEATING *NAUSEA AND VOMITING THAT IS NOT CONTROLLED WITH YOUR NAUSEA MEDICATION *UNUSUAL SHORTNESS OF BREATH *UNUSUAL BRUISING OR BLEEDING *URINARY PROBLEMS (pain or burning when urinating, or frequent urination) *BOWEL PROBLEMS (unusual diarrhea, constipation, pain near the anus) TENDERNESS IN MOUTH AND THROAT WITH OR WITHOUT PRESENCE OF ULCERS (sore throat, sores in mouth, or a toothache) UNUSUAL RASH, SWELLING OR PAIN  UNUSUAL VAGINAL DISCHARGE OR ITCHING   Items with * indicate a potential emergency and should be followed up as soon as possible or go to the Emergency Department if any problems should occur.  Please show the CHEMOTHERAPY ALERT CARD or IMMUNOTHERAPY ALERT CARD at check-in to the  Emergency Department and triage nurse.  Should you have questions after your visit or need to cancel or reschedule your appointment, please contact [facility] [place]  [phone] and follow the prompts.  Office hours are 8:00 a.m. to 4:30 p.m. Monday - Friday. Please note that voicemails left after 4:00 p.m. may not be returned until the following business day.  We are closed weekends and major holidays. You have access to a nurse at all times for urgent questions. Please call the main number to the clinic [phone] and follow the prompts.  For any non-urgent questions, you may also contact your provider using MyChart. We now offer e-Visits for anyone 18 and older to request care online for non-urgent symptoms. For details visit [email or web address].   Also download the MyChart app! Go to the app store, search "MyChart", open the app, select [place], and log in with your MyChart username and password.  Masks are optional in the cancer centers. If you would like for your care team to wear a mask while they are taking care of you, please let them know. For doctor visits, patients may have with them one support person who is at least 68 years old. At this time, visitors are not allowed in the infusion area.  Zoledronic Acid Injection (Cancer) What is this medication? ZOLEDRONIC ACID (ZOE le dron ik AS id) treats high calcium levels in the blood caused by cancer. It may also be used with chemotherapy to treat weakened bones caused by cancer. It works by slowing down the release of calcium from bones. This lowers calcium levels in   your blood. It also makes your bones stronger and less likely to break (fracture). It belongs to a group of medications called bisphosphonates. This medicine may be used for other purposes; ask your health care provider or pharmacist if you have questions. COMMON BRAND NAME(S): Zometa, Zometa Powder What should I tell my care team before I  take this medication? They need to know if you have any of these conditions: Dehydration Dental disease Kidney disease Liver disease Low levels of calcium in the blood Lung or breathing disease, such as asthma Receiving steroids, such as dexamethasone or prednisone An unusual or allergic reaction to zoledronic acid, other medications, foods, dyes, or preservatives Pregnant or trying to get pregnant Breast-feeding How should I use this medication? This medication is injected into a vein. It is given by your care team in a hospital or clinic setting. Talk to your care team about the use of this medication in children. Special care may be needed. Overdosage: If you think you have taken too much of this medicine contact a poison control center or emergency room at once. NOTE: This medicine is only for you. Do not share this medicine with others. What if I miss a dose? Keep appointments for follow-up doses. It is important not to miss your dose. Call your care team if you are unable to keep an appointment. What may interact with this medication? Certain antibiotics given by injection Diuretics, such as bumetanide, furosemide NSAIDs, medications for pain and inflammation, such as ibuprofen or naproxen Teriparatide Thalidomide This list may not describe all possible interactions. Give your health care provider a list of all the medicines, herbs, non-prescription drugs, or dietary supplements you use. Also tell them if you smoke, drink alcohol, or use illegal drugs. Some items may interact with your medicine. What should I watch for while using this medication? Visit your care team for regular checks on your progress. It may be some time before you see the benefit from this medication. Some people who take this medication have severe bone, joint, or muscle pain. This medication may also increase your risk for jaw problems or a broken thigh bone. Tell your care team right away if you have severe  pain in your jaw, bones, joints, or muscles. Tell you care team if you have any pain that does not go away or that gets worse. Tell your dentist and dental surgeon that you are taking this medication. You should not have major dental surgery while on this medication. See your dentist to have a dental exam and fix any dental problems before starting this medication. Take good care of your teeth while on this medication. Make sure you see your dentist for regular follow-up appointments. You should make sure you get enough calcium and vitamin D while you are taking this medication. Discuss the foods you eat and the vitamins you take with your care team. Check with your care team if you have severe diarrhea, nausea, and vomiting, or if you sweat a lot. The loss of too much body fluid may make it dangerous for you to take this medication. You may need bloodwork while taking this medication. Talk to your care team if you wish to become pregnant or think you might be pregnant. This medication can cause serious birth defects. What side effects may I notice from receiving this medication? Side effects that you should report to your care team as soon as possible: Allergic reactions--skin rash, itching, hives, swelling of the face, lips, tongue, or throat   Kidney injury--decrease in the amount of urine, swelling of the ankles, hands, or feet Low calcium level--muscle pain or cramps, confusion, tingling, or numbness in the hands or feet Osteonecrosis of the jaw--pain, swelling, or redness in the mouth, numbness of the jaw, poor healing after dental work, unusual discharge from the mouth, visible bones in the mouth Severe bone, joint, or muscle pain Side effects that usually do not require medical attention (report to your care team if they continue or are bothersome): Constipation Fatigue Fever Loss of appetite Nausea Stomach pain This list may not describe all possible side effects. Call your doctor for  medical advice about side effects. You may report side effects to FDA at 1-800-FDA-1088. Where should I keep my medication? This medication is given in a hospital or clinic. It will not be stored at home. NOTE: This sheet is a summary. It may not cover all possible information. If you have questions about this medicine, talk to your doctor, pharmacist, or health care provider.  2023 Elsevier/Gold Standard (2022-01-21 00:00:00)    

## 2022-08-10 ENCOUNTER — Other Ambulatory Visit: Payer: Self-pay

## 2022-08-10 DIAGNOSIS — Z08 Encounter for follow-up examination after completed treatment for malignant neoplasm: Secondary | ICD-10-CM

## 2022-08-27 ENCOUNTER — Ambulatory Visit: Payer: PPO | Attending: Physician Assistant

## 2022-08-27 DIAGNOSIS — Z79899 Other long term (current) drug therapy: Secondary | ICD-10-CM

## 2022-08-28 ENCOUNTER — Other Ambulatory Visit: Payer: Self-pay | Admitting: Internal Medicine

## 2022-08-28 LAB — LIPID PANEL
Chol/HDL Ratio: 5.3 ratio — ABNORMAL HIGH (ref 0.0–4.4)
Cholesterol, Total: 164 mg/dL (ref 100–199)
HDL: 31 mg/dL — ABNORMAL LOW (ref >39)
LDL Chol Calc (NIH): 76 mg/dL (ref 0–99)
Triglycerides: 358 mg/dL — ABNORMAL HIGH (ref 0–149)
VLDL Cholesterol Cal: 57 mg/dL — ABNORMAL HIGH (ref 5–40)

## 2022-08-28 LAB — HEPATIC FUNCTION PANEL
ALT: 16 IU/L (ref 0–32)
AST: 16 IU/L (ref 0–40)
Albumin: 4.2 g/dL (ref 3.9–4.9)
Alkaline Phosphatase: 87 IU/L (ref 44–121)
Bilirubin Total: 0.2 mg/dL (ref 0.0–1.2)
Bilirubin, Direct: 0.1 mg/dL (ref 0.00–0.40)
Total Protein: 6.6 g/dL (ref 6.0–8.5)

## 2022-08-28 LAB — SPECIMEN STATUS REPORT

## 2022-08-30 MED ORDER — FUROSEMIDE 40 MG PO TABS: 40.0000 mg | ORAL_TABLET | Freq: Two times a day (BID) | ORAL | 1 refills | Status: DC

## 2022-08-30 NOTE — Addendum Note (Signed)
Addended by: Aris Georgia, Jasiah Elsen L on: 08/30/2022 04:40 PM   Modules accepted: Orders

## 2022-09-01 NOTE — Telephone Encounter (Signed)
Please make sure she is set up for fasting Lipids in 6 mos. Richardson Dopp, PA-C    09/01/2022 4:40 PM

## 2022-09-02 ENCOUNTER — Other Ambulatory Visit: Payer: Self-pay | Admitting: *Deleted

## 2022-09-02 DIAGNOSIS — Z79899 Other long term (current) drug therapy: Secondary | ICD-10-CM

## 2022-09-06 ENCOUNTER — Telehealth: Payer: Self-pay | Admitting: *Deleted

## 2022-09-06 NOTE — Patient Outreach (Signed)
  Care Coordination   09/06/2022 Name: [name] MRN: [id number] DOB: 05/01/1956   Care Coordination Outreach Attempts:  An unsuccessful telephone outreach was attempted today to offer the patient information about available care coordination services as a benefit of their health plan.   Follow Up Plan:  Additional outreach attempts will be made to offer the patient care coordination information and services.   Encounter Outcome:  No Answer  Care Coordination Interventions Activated:  No   Care Coordination Interventions:  No, not indicated    SIG Kai Calico C. Myrtie Neither, MSN, [facility] Ger[facility] Nurse Practitioner [facility] Care Management [phone]

## 2022-09-27 ENCOUNTER — Telehealth: Payer: Self-pay | Admitting: *Deleted

## 2022-09-27 NOTE — Patient Outreach (Signed)
  Care Coordination   09/27/2022 Name: [name] MRN: [id number] DOB: 28-Jan-1956   Care Coordination Outreach Attempts:  A second unsuccessful outreach was attempted today to offer the patient with information about available care coordination services as a benefit of their health plan.     Follow Up Plan:  Additional outreach attempts will be made to offer the patient care coordination information and services.   Encounter Outcome:  No Answer  Care Coordination Interventions Activated:  No   Care Coordination Interventions:  No, not indicated    SIG Temiloluwa Recchia C. Myrtie Neither, MSN, [facility] Gerontological Nurse Practitioner [facility] Care Management [phone]

## 2022-10-04 ENCOUNTER — Telehealth: Payer: Self-pay | Admitting: *Deleted

## 2022-10-04 NOTE — Patient Outreach (Signed)
  Care Coordination   10/04/2022 Name: [name] MRN: [id number] DOB: [birth year]   Care Coordination Outreach Attempts:  A third unsuccessful outreach was attempted today to offer the patient with information about available care coordination services as a benefit of their health plan.   Follow Up Plan:  No further outreach attempts will be made at this time. We have been unable to contact the patient to offer or enroll patient in care coordination services  Encounter Outcome:  No Answer  Care Coordination Interventions Activated:  No   Care Coordination Interventions:  No, not indicated    Jacqlyn Larsen [facility], BSN [facility] RN Care Coordinator [phone]

## 2022-10-07 ENCOUNTER — Encounter: Payer: Self-pay | Admitting: Oncology

## 2022-10-08 ENCOUNTER — Ambulatory Visit
Admission: RE | Admit: 2022-10-08 | Discharge: 2022-10-08 | Disposition: A | Discharge status: Home / Self Care | Payer: PPO | Source: Ambulatory Visit | Attending: Oncology | Admitting: Oncology

## 2022-10-08 DIAGNOSIS — Z08 Encounter for follow-up examination after completed treatment for malignant neoplasm: Secondary | ICD-10-CM | POA: Diagnosis not present

## 2022-10-08 DIAGNOSIS — Z853 Personal history of malignant neoplasm of breast: Secondary | ICD-10-CM | POA: Insufficient documentation

## 2022-10-08 DIAGNOSIS — Z1231 Encounter for screening mammogram for malignant neoplasm of breast: Secondary | ICD-10-CM | POA: Diagnosis not present

## 2022-10-18 ENCOUNTER — Encounter (INDEPENDENT_AMBULATORY_CARE_PROVIDER_SITE_OTHER): Payer: Self-pay

## 2022-11-16 ENCOUNTER — Other Ambulatory Visit: Payer: Self-pay | Admitting: Cardiology

## 2022-11-16 ENCOUNTER — Other Ambulatory Visit: Payer: Self-pay | Admitting: Oncology

## 2023-01-14 ENCOUNTER — Other Ambulatory Visit: Payer: Self-pay | Admitting: Physician Assistant

## 2023-01-14 ENCOUNTER — Other Ambulatory Visit: Payer: Self-pay | Admitting: Oncology

## 2023-02-09 ENCOUNTER — Encounter: Payer: Self-pay | Admitting: Oncology

## 2023-02-09 ENCOUNTER — Inpatient Hospital Stay (HOSPITAL_BASED_OUTPATIENT_CLINIC_OR_DEPARTMENT_OTHER): Payer: PPO | Admitting: Oncology

## 2023-02-09 ENCOUNTER — Inpatient Hospital Stay: Payer: PPO | Attending: Oncology

## 2023-02-09 ENCOUNTER — Other Ambulatory Visit: Payer: Self-pay | Admitting: *Deleted

## 2023-02-09 ENCOUNTER — Inpatient Hospital Stay: Payer: PPO

## 2023-02-09 ENCOUNTER — Telehealth: Payer: Self-pay | Admitting: *Deleted

## 2023-02-09 VITALS — BP 123/77 | HR 58 | Temp 97.7°F | Ht <= 58 in | Wt 195.4 lb

## 2023-02-09 DIAGNOSIS — Z170 Estrogen receptor positive status [ER+]: Secondary | ICD-10-CM

## 2023-02-09 DIAGNOSIS — I130 Hypertensive heart and chronic kidney disease with heart failure and stage 1 through stage 4 chronic kidney disease, or unspecified chronic kidney disease: Secondary | ICD-10-CM | POA: Insufficient documentation

## 2023-02-09 DIAGNOSIS — E1122 Type 2 diabetes mellitus with diabetic chronic kidney disease: Secondary | ICD-10-CM | POA: Insufficient documentation

## 2023-02-09 DIAGNOSIS — I5042 Chronic combined systolic (congestive) and diastolic (congestive) heart failure: Secondary | ICD-10-CM | POA: Insufficient documentation

## 2023-02-09 DIAGNOSIS — C50912 Malignant neoplasm of unspecified site of left female breast: Secondary | ICD-10-CM | POA: Diagnosis not present

## 2023-02-09 DIAGNOSIS — Z5112 Encounter for antineoplastic immunotherapy: Secondary | ICD-10-CM | POA: Insufficient documentation

## 2023-02-09 DIAGNOSIS — Z79811 Long term (current) use of aromatase inhibitors: Secondary | ICD-10-CM | POA: Insufficient documentation

## 2023-02-09 DIAGNOSIS — Z87891 Personal history of nicotine dependence: Secondary | ICD-10-CM | POA: Diagnosis not present

## 2023-02-09 DIAGNOSIS — Z9221 Personal history of antineoplastic chemotherapy: Secondary | ICD-10-CM | POA: Diagnosis not present

## 2023-02-09 DIAGNOSIS — Z79899 Other long term (current) drug therapy: Secondary | ICD-10-CM | POA: Insufficient documentation

## 2023-02-09 DIAGNOSIS — C50812 Malignant neoplasm of overlapping sites of left female breast: Secondary | ICD-10-CM | POA: Diagnosis not present

## 2023-02-09 DIAGNOSIS — Z95828 Presence of other vascular implants and grafts: Secondary | ICD-10-CM

## 2023-02-09 DIAGNOSIS — Z7969 Long term (current) use of other immunomodulators and immunosuppressants: Secondary | ICD-10-CM | POA: Diagnosis not present

## 2023-02-09 DIAGNOSIS — Z5181 Encounter for therapeutic drug level monitoring: Secondary | ICD-10-CM | POA: Diagnosis not present

## 2023-02-09 DIAGNOSIS — Z794 Long term (current) use of insulin: Secondary | ICD-10-CM | POA: Diagnosis not present

## 2023-02-09 DIAGNOSIS — R948 Abnormal results of function studies of other organs and systems: Secondary | ICD-10-CM

## 2023-02-09 DIAGNOSIS — Z08 Encounter for follow-up examination after completed treatment for malignant neoplasm: Secondary | ICD-10-CM

## 2023-02-09 DIAGNOSIS — N183 Chronic kidney disease, stage 3 unspecified: Secondary | ICD-10-CM | POA: Diagnosis not present

## 2023-02-09 DIAGNOSIS — Z923 Personal history of irradiation: Secondary | ICD-10-CM | POA: Diagnosis not present

## 2023-02-09 DIAGNOSIS — Z7983 Long term (current) use of bisphosphonates: Secondary | ICD-10-CM | POA: Diagnosis not present

## 2023-02-09 DIAGNOSIS — Z7982 Long term (current) use of aspirin: Secondary | ICD-10-CM | POA: Insufficient documentation

## 2023-02-09 LAB — CBC WITH DIFFERENTIAL/PLATELET
Abs Immature Granulocytes: 0.02 10*3/uL (ref 0.00–0.07)
Basophils Absolute: 0 10*3/uL (ref 0.0–0.1)
Basophils Relative: 0 %
Eosinophils Absolute: 0.2 10*3/uL (ref 0.0–0.5)
Eosinophils Relative: 3 %
HCT: 42.9 % (ref 36.0–46.0)
Hemoglobin: 13.7 g/dL (ref 12.0–15.0)
Immature Granulocytes: 0 %
Lymphocytes Relative: 19 %
Lymphs Abs: 1.5 10*3/uL (ref 0.7–4.0)
MCH: 28 pg (ref 26.0–34.0)
MCHC: 31.9 g/dL (ref 30.0–36.0)
MCV: 87.7 fL (ref 80.0–100.0)
Monocytes Absolute: 0.5 10*3/uL (ref 0.1–1.0)
Monocytes Relative: 6 %
Neutro Abs: 5.5 10*3/uL (ref 1.7–7.7)
Neutrophils Relative %: 72 %
Platelets: 203 10*3/uL (ref 150–400)
RBC: 4.89 MIL/uL (ref 3.87–5.11)
RDW: 14.6 % (ref 11.5–15.5)
WBC: 7.8 10*3/uL (ref 4.0–10.5)
nRBC: 0 % (ref 0.0–0.2)

## 2023-02-09 LAB — COMPREHENSIVE METABOLIC PANEL
ALT: 20 U/L (ref 0–44)
AST: 20 U/L (ref 15–41)
Albumin: 4 g/dL (ref 3.5–5.0)
Alkaline Phosphatase: 70 U/L (ref 38–126)
Anion gap: 12 (ref 5–15)
BUN: 64 mg/dL — ABNORMAL HIGH (ref 8–23)
CO2: 25 mmol/L (ref 22–32)
Calcium: 9.2 mg/dL (ref 8.9–10.3)
Chloride: 102 mmol/L (ref 98–111)
Creatinine, Ser: 2.44 mg/dL — ABNORMAL HIGH (ref 0.44–1.00)
GFR, Estimated: 21 mL/min — ABNORMAL LOW (ref >60)
Glucose, Bld: 171 mg/dL — ABNORMAL HIGH (ref 70–99)
Potassium: 4 mmol/L (ref 3.5–5.1)
Sodium: 139 mmol/L (ref 135–145)
Total Bilirubin: 0.9 mg/dL (ref 0.3–1.2)
Total Protein: 7.3 g/dL (ref 6.5–8.1)

## 2023-02-09 MED ORDER — HEPARIN SOD (PORK) LOCK FLUSH 100 UNIT/ML IV SOLN
500.0000 [IU] | Freq: Once | INTRAVENOUS | Status: AC
  Administered 2023-02-09: 500 [IU] via INTRAVENOUS
  Filled 2023-02-09: qty 5

## 2023-02-09 MED ORDER — SODIUM CHLORIDE 0.9 % IV SOLN
Freq: Once | INTRAVENOUS | Status: AC
  Filled 2023-02-09: qty 250

## 2023-02-09 MED ORDER — ZOLEDRONIC ACID 4 MG/5ML IV CONC
3.0000 mg | INTRAVENOUS | Status: DC
  Administered 2023-02-09: 3 mg via INTRAVENOUS
  Filled 2023-02-09: qty 3.75

## 2023-02-09 MED ORDER — SODIUM CHLORIDE 0.9% FLUSH
10.0000 mL | Freq: Once | INTRAVENOUS | Status: AC
  Administered 2023-02-09: 10 mL via INTRAVENOUS
  Filled 2023-02-09: qty 10

## 2023-02-09 NOTE — Progress Notes (Signed)
CrCl ~62m/min.  Per MD to keep on Zometa 315mdose as is today.

## 2023-02-09 NOTE — Progress Notes (Signed)
Hematology/Oncology Consult note [facility]  Telephone:([phone] [phone]  Patient Care Team: [name], MD as PCP - General (Internal Medicine) Minus Breeding, MD as PCP - Cardiology (Cardiology) Jacelyn Pi, MD as Referring Physician (Endocrinology) Lahoma Rocker, MD as Consulting Physician (Rheumatology) Noreene Filbert, MD as Referring Physician (Radiation Oncology) [name], MD as Consulting Physician (Oncology) Jovita Kussmaul, MD as Consulting Physician (General Surgery) Rico Junker, RN as Registered Nurse Theodore Demark, RN (Inactive) as Registered Nurse Lavonna Monarch, MD (Inactive) as Consulting Physician (Dermatology) Sharmon Revere as Physician Assistant (Cardiology)   Name of the patient: [name]  [id number]  1956/03/21   Date of visit: 02/09/23  Diagnosis-  Invasive lobular carcinoma of the left breast pathological prognostic stage Ib pT2 pN2 acM0 ER/PR positive HER-2/neu negative s/p mastectomy and targeted node dissection   Chief complaint/ Reason for visit-routine follow-up of breast cancer on letrozole and to receive Zometa  Heme/Onc history: Patient is a 67 year old female diagnosed with left breast cancer in October 2020.  Mammogram showed irregular mass with increased vascularity measuring 3.1 x 2.4 x 4.5 cm.  The nodularity extended towards the nipple measuring at least 5.4 cm.  Single abnormal lymph node in the left axilla with cortical thickness of 0.7 cm.  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     PET scan showed hypermetabolic left axillary lymph nodes which was reviewed at tumor board and they were at least found to be 3-4.  Retroareolar left breast mass 2.5 x 3.2 cm in size.  No evidence of distant metastatic disease. Final pathology  showed 2 separate tumors one which was 4.5 cm and the other one was 4.4 cm in size.  Grade 2 ER/PR positive HER-2/neu negative with negative margins. 5 out of 11 lymph nodes were positive for malignancy.  Extranodal extension present.  Largest size of metastatic deposit 14 mm.  mpT2 mpN2   Patient is not a candidate for anthracycline-based chemotherapy per cardiology.  Adjuvant TC chemotherapy completed on 03/11/2020.  She completed adjuvant radiation therapy as well and started Arimidex which she could not tolerate and was to Aromasin on 06/19/2020.  She was subsequently switched to letrozole  Interval history-she continues to have joint pain because of Psoriatic arthritis.  She is on monthly injections for that.  Tolerating letrozole well without any significant side effects.  She is also taking her calcium and vitamin D.  ECOG PS- 2 Pain scale- 0   Review of systems- Review of Systems  Constitutional:  Positive for malaise/fatigue. Negative for chills, fever and weight loss.  HENT:  Negative for congestion, ear discharge and nosebleeds.   Eyes:  Negative for blurred vision.  Respiratory:  Negative for cough, hemoptysis, sputum production, shortness of breath and wheezing.   Cardiovascular:  Negative for chest pain, palpitations, orthopnea and claudication.  Gastrointestinal:  Negative for abdominal pain, blood in stool, constipation, diarrhea, heartburn, melena, nausea and vomiting.  Genitourinary:  Negative for dysuria, flank pain, frequency, hematuria and urgency.  Musculoskeletal:  Positive for joint pain. Negative for back pain and myalgias.  Skin:  Negative for rash.  Neurological:  Negative for dizziness, tingling, focal weakness, seizures, weakness and headaches.  Endo/Heme/Allergies:  Does not bruise/bleed easily.  Psychiatric/Behavioral:  Negative for depression and suicidal ideas. The patient does  not have insomnia.       Allergies  Allergen Reactions   Hydralazine Swelling     Eye Swelling   Other Other (See Comments)    Unknown blood pressure medication caused face swelling     Past Medical History:  Diagnosis Date   Arthritis of both feet    Gout   Cancer ([place])    Breast Cancer on left   Chronic combined systolic and diastolic CHF (congestive heart failure) ([place])    a. Echo (06/2014):  Severe LVH, EF 20-25%, no RWMA, Gr 3 DD, trivial MR, mild LAE, mild RVE, PASP 38 mmHg .>> b. Echo 2/16 EF 50-55% //  // c. Echo 02/2019: EF 50-55, mod LVH, Gr 1 DD, mild LAE // Echocardiogram 02/2020: EF 60-65, no RWMA, mild LVH, Gr 1 DD, normal RVSF, RVSP 30.5    CKD (chronic kidney disease), stage III (HCC)    now stage 4   Diabetes mellitus type 2 in obese [facility])    Diverticular disease    Dyspnea    Family history of breast cancer    Family history of leukemia    Family history of lung cancer    Family history of testicular cancer    Family history of throat cancer    Fatty liver    History of GI diverticular bleed    Hyperlipidemia    Hyperphosphatemia    Hypertension    Hypertensive heart disease    a. Renal Artery Duplex (8/15):  no RAS   Iron deficiency anemia    NICM (nonischemic cardiomyopathy) ([place])    a. Nuclear (06/2014):  EF 32%, apical thinning, no definite scar or ischemia;  b. Echo (2/16):  Mild LVH, EF 50-55%, Gr 2 DD, no RWMA   Obesity    Personal history of chemotherapy    Personal history of radiation therapy    PONV (postoperative nausea and vomiting)    after D and C   Proteinuria    Psoriasis    PUD (peptic ulcer disease)    Secondary hyperparathyroidism, renal (HCC)    Tobacco abuse    UTI (lower urinary tract infection)    Vaginal dryness, menopausal    Vaginal yeast infection      Past Surgical History:  Procedure Laterality Date   BREAST BIOPSY Left 2020   positive    COLONOSCOPY     DILATION AND CURETTAGE OF UTERUS     MASTECTOMY Left 2020   MASTECTOMY W/ SENTINEL NODE BIOPSY Left 11/12/2019   MASTECTOMY W/ SENTINEL NODE  BIOPSY Left 11/12/2019   Procedure: LEFT MASTECTOMY WITH SENTINEL LYMPH NODE BIOPSY AND TARGETED NODE DISSECTION;  Surgeon: Jovita Kussmaul, MD;  Location: [place];  Service: General;  Laterality: Left;   None     PORTA CATH INSERTION N/A 12/11/2019   Procedure: PORTA CATH INSERTION;  Surgeon: Katha Cabal, MD;  Location: [place] [facility];  Service: Cardiovascular;  Laterality: N/A;    Social History   Socioeconomic History   Marital status: Widowed    Spouse name: Not on file   Number of children: 2   Years of education: 7   Highest education level: Not on file  Occupational History   Not on file  Tobacco Use   Smoking status: Former    Types: Cigarettes    Quit date: 07/05/2014    Years since quitting: 8.6   Smokeless tobacco: Never  Vaping Use   Vaping Use: Never used  Substance and Sexual  Activity   Alcohol use: No   Drug use: No   Sexual activity: Not Currently  Other Topics Concern   Not on file  Social History Narrative   Currently lives in a house with her husband.    Fun: Watch TV.   Denies religious beliefs effecting health care.    Social Determinants of Health   Financial Resource Strain: Not on file  Food Insecurity: Not on file  Transportation Needs: Not on file  Physical Activity: Not on file  Stress: Not on file  Social Connections: Not on file  Intimate Partner Violence: Not on file    Family History  Problem Relation Age of Onset   Heart attack Paternal Grandfather    Stroke Paternal Grandmother    Hypertension Paternal Grandmother    Kidney disease Mother    Diabetes Mother    Testicular cancer Father        diagnosed older than 38   Coronary artery disease Other    Hypertension Other    Breast cancer Sister        diagnosed late 87s, negative genetics   Leukemia Brother        chronic   Throat cancer Brother    Pneumonia Sister 2   Lung cancer Paternal Uncle        diagnosed over 67     Current Outpatient Medications:     acetaminophen (TYLENOL) 500 MG tablet, Take 1,000 mg by mouth every 6 (six) hours as needed for mild pain, moderate pain or headache. For pain, Disp: , Rfl:    allopurinol (ZYLOPRIM) 100 MG tablet, Take 200 mg by mouth daily., Disp: , Rfl:    aspirin 81 MG EC tablet, Take 1 tablet (81 mg total) by mouth daily., Disp: 30 tablet, Rfl:    atorvastatin (LIPITOR) 40 MG tablet, TAKE 1 TABLET BY MOUTH DAILY AT 6 PM., Disp: 90 tablet, Rfl: 3   B-D ULTRAFINE III SHORT PEN 31G X 8 MM MISC, , Disp: , Rfl: 0   betamethasone dipropionate 0.05 % cream, Apply topically 2 (two) times daily., Disp: 30 g, Rfl: 0   blood glucose meter kit and supplies KIT, Dispense based on patient and insurance preference. Use up to four times daily as directed. (FOR ICD-9 250.00, 250.01)., Disp: 1 each, Rfl: 0   calcitRIOL (ROCALTROL) 0.25 MCG capsule, Take 0.25 mcg by mouth daily., Disp: , Rfl:    calcium carbonate (OSCAL) 1500 (600 Ca) MG TABS tablet, Take 1 tablet by mouth 2 (two) times daily with a meal., Disp: , Rfl:    Colchicine 0.6 MG CAPS, Take 0.6 mg by mouth every other day., Disp: , Rfl:    COSENTYX SENSOREADY PEN 150 MG/ML SOAJ, Inject 150 mg into the skin every 30 (thirty) days., Disp: , Rfl:    furosemide (LASIX) 40 MG tablet, TAKE 1 TABLET BY MOUTH TWICE A DAY, Disp: 180 tablet, Rfl: 0   gabapentin (NEURONTIN) 100 MG capsule, TAKE 2 CAPSULES BY MOUTH 3 TIMES A DAY, Disp: 180 capsule, Rfl: 1   insulin aspart (NOVOLOG FLEXPEN) 100 UNIT/ML FlexPen, Inject 5 Units into the skin 3 (three) times daily with meals. Additional insulin (5 units PLUS) CBG 70 - 120: 0 units CBG 121 - 150: 1 unit CBG 151 - 200: 2 units CBG 201 - 250: 3 units CBG 251 - 350: 4 units CBG 351 - 400: 6 units, Disp: 15 mL, Rfl: 11   insulin glargine (LANTUS) 100 UNIT/ML injection, Inject 25 Units  into the skin at bedtime., Disp: , Rfl:    isosorbide mononitrate (IMDUR) 60 MG 24 hr tablet, TAKE 1 TABLET BY MOUTH EVERY DAY, Disp: 90 tablet, Rfl: 1    leflunomide (ARAVA) 20 MG tablet, Take 20 mg by mouth daily., Disp: , Rfl:    letrozole (FEMARA) 2.5 MG tablet, TAKE 1 TABLET BY MOUTH EVERY DAY, Disp: 90 tablet, Rfl: 0   lidocaine-prilocaine (EMLA) cream, Apply to affected area once, Disp: 30 g, Rfl: 3   metoprolol tartrate (LOPRESSOR) 25 MG tablet, TAKE 1 TABLET BY MOUTH TWICE A DAY, Disp: 180 tablet, Rfl: 3   ONE TOUCH ULTRA TEST test strip, 1 each by Other route 4 (four) times daily., Disp: , Rfl: 1   ONETOUCH DELICA LANCETS 99991111 MISC, Inject 33 g into the skin 4 (four) times daily., Disp: , Rfl: 1   senna (SENOKOT) 8.6 MG tablet, Take 1 tablet by mouth daily., Disp: , Rfl:    telmisartan (MICARDIS) 20 MG tablet, Take 1 tablet (20 mg total) by mouth daily., Disp: 90 tablet, Rfl: 3   traMADol (ULTRAM) 50 MG tablet, 1 tablet as needed, Disp: , Rfl:    amLODipine (NORVASC) 5 MG tablet, , Disp: , Rfl:  No current facility-administered medications for this visit.  Facility-Administered Medications Ordered in Other Visits:    zoledronic acid (ZOMETA) 3 mg in sodium chloride 0.9 % 100 mL IVPB, 3 mg, Intravenous, Q6 months, [name], MD, Last Rate: 415 mL/hr at 02/09/23 1134, 3 mg at 02/09/23 1134  Physical exam:  Vitals:   02/09/23 0956  BP: 123/77  Pulse: (!) 58  Temp: 97.7 F (36.5 C)  TempSrc: Tympanic  SpO2: 97%  Weight: 195 lb 6.4 oz (88.6 kg)  Height: 4' 9"$  (1.448 m)   Physical Exam Cardiovascular:     Rate and Rhythm: Normal rate and regular rhythm.     Heart sounds: Normal heart sounds.  Pulmonary:     Effort: Pulmonary effort is normal.     Breath sounds: Normal breath sounds.  Abdominal:     General: Bowel sounds are normal.     Palpations: Abdomen is soft.  Musculoskeletal:     Cervical back: Normal range of motion.  Skin:    General: Skin is warm and dry.  Neurological:     Mental Status: She is alert and oriented to person, place, and time.   Breast exam: Patient is s/p left mastectomy without  reconstruction.  No evidence of chest wall recurrence.  No palpable bilateral axillary adenopathy.  No palpable masses in the right breast.     Latest Ref Rng & Units 02/09/2023    9:16 AM  CMP  Glucose 70 - 99 mg/dL 171   BUN 8 - 23 mg/dL 64   Creatinine 0.44 - 1.00 mg/dL 2.44   Sodium 135 - 145 mmol/L 139   Potassium 3.5 - 5.1 mmol/L 4.0   Chloride 98 - 111 mmol/L 102   CO2 22 - 32 mmol/L 25   Calcium 8.9 - 10.3 mg/dL 9.2   Total Protein 6.5 - 8.1 g/dL 7.3   Total Bilirubin 0.3 - 1.2 mg/dL 0.9   Alkaline Phos 38 - 126 U/L 70   AST 15 - 41 U/L 20   ALT 0 - 44 U/L 20       Latest Ref Rng & Units 02/09/2023    9:16 AM  CBC  WBC 4.0 - 10.5 K/uL 7.8   Hemoglobin 12.0 - 15.0 g/dL 13.7  Hematocrit 36.0 - 46.0 % 42.9   Platelets 150 - 400 K/uL 203      Assessment and plan- Patient is a 67 y.o. female with pathological prognostic stage Ib invasive lobular carcinoma pT2 pN2 acM0 ER/PR positive HER-2/neu negative s/p left mastectomy and targeted node dissection.  She has completed 4 cycles of adjuvant TC chemotherapy And adjuvant radiation therapy.  This is a routine follow-up visit  Clinically patient is doing well with no concerning signs and symptoms of recurrence based on today's exam.  Patient started endocrine therapy in July 2021 and will continue letrozole at least for 5 years.  She is overdue for bone density scan which will be scheduled at this time.  Patient has also been receiving Zometa every 6 months.  Given her kidney function she is receiving a reduced dose of 3 mg.  She will receive Zometa today and I will see her back in 6 months for the next dose of Zometa.  Looking back at her PET scan in 2020 she had mild nonspecific retroperitoneal lymph nodes and I will plan to follow-up on this with a repeat CT abdomen pelvis without contrast at this time     Visit Diagnosis 1. Malignant neoplasm of overlapping sites of left breast in female, estrogen receptor positive ([place])    2. Use of letrozole (Femara)   3. Encounter for monitoring zoledronic acid therapy      Dr. Randa Evens, MD, MPH [facility] [id number] 02/09/2023 11:35 AM

## 2023-02-09 NOTE — Telephone Encounter (Signed)
Called pt and got her voicemail. Dr. Janese Banks was completing her notes for your visit and she saw that you had a PET scan in October 2020 when she had nonspecific retroperitoneal lymph nodes. So she would like to get CT scan to f/u in the lymph nodes. I asked her to call me back and see if she is agreeable to the scan.. I left her my phone desk number

## 2023-02-27 ENCOUNTER — Other Ambulatory Visit: Payer: Self-pay | Admitting: Cardiology

## 2023-03-02 ENCOUNTER — Ambulatory Visit
Admission: RE | Admit: 2023-03-02 | Discharge: 2023-03-02 | Disposition: A | Discharge status: Home / Self Care | Payer: PPO | Source: Ambulatory Visit | Attending: Oncology | Admitting: Oncology

## 2023-03-02 DIAGNOSIS — C50812 Malignant neoplasm of overlapping sites of left female breast: Secondary | ICD-10-CM | POA: Diagnosis present

## 2023-03-02 DIAGNOSIS — R948 Abnormal results of function studies of other organs and systems: Secondary | ICD-10-CM | POA: Diagnosis present

## 2023-03-02 DIAGNOSIS — Z170 Estrogen receptor positive status [ER+]: Secondary | ICD-10-CM | POA: Diagnosis present

## 2023-03-04 ENCOUNTER — Other Ambulatory Visit: Payer: PPO

## 2023-03-06 ENCOUNTER — Other Ambulatory Visit: Payer: Self-pay | Admitting: Physician Assistant

## 2023-03-14 ENCOUNTER — Ambulatory Visit: Payer: PPO

## 2023-03-21 ENCOUNTER — Ambulatory Visit: Payer: PPO | Attending: Physician Assistant

## 2023-03-21 DIAGNOSIS — Z79899 Other long term (current) drug therapy: Secondary | ICD-10-CM

## 2023-03-22 LAB — LIPID PANEL
Chol/HDL Ratio: 4.3 ratio (ref 0.0–4.4)
Cholesterol, Total: 152 mg/dL (ref 100–199)
HDL: 35 mg/dL — ABNORMAL LOW (ref >39)
LDL Chol Calc (NIH): 81 mg/dL (ref 0–99)
Triglycerides: 214 mg/dL — ABNORMAL HIGH (ref 0–149)
VLDL Cholesterol Cal: 36 mg/dL (ref 5–40)

## 2023-03-25 NOTE — Progress Notes (Signed)
Pt has been made aware of normal result and verbalized understanding.  jw

## 2023-03-28 ENCOUNTER — Ambulatory Visit
Admission: RE | Admit: 2023-03-28 | Discharge: 2023-03-28 | Disposition: A | Discharge status: Home / Self Care | Payer: PPO | Source: Ambulatory Visit | Attending: Oncology | Admitting: Oncology

## 2023-03-28 DIAGNOSIS — Z853 Personal history of malignant neoplasm of breast: Secondary | ICD-10-CM | POA: Insufficient documentation

## 2023-03-28 DIAGNOSIS — M8589 Other specified disorders of bone density and structure, multiple sites: Secondary | ICD-10-CM | POA: Diagnosis not present

## 2023-03-28 DIAGNOSIS — Z780 Asymptomatic menopausal state: Secondary | ICD-10-CM | POA: Diagnosis not present

## 2023-03-28 DIAGNOSIS — Z08 Encounter for follow-up examination after completed treatment for malignant neoplasm: Secondary | ICD-10-CM | POA: Diagnosis present

## 2023-04-06 ENCOUNTER — Other Ambulatory Visit: Payer: Self-pay | Admitting: Oncology

## 2023-04-06 ENCOUNTER — Other Ambulatory Visit: Payer: Self-pay | Admitting: Physician Assistant

## 2023-04-09 ENCOUNTER — Other Ambulatory Visit: Payer: Self-pay | Admitting: Cardiology

## 2023-04-19 ENCOUNTER — Other Ambulatory Visit: Payer: Self-pay | Admitting: Oncology

## 2023-04-26 ENCOUNTER — Other Ambulatory Visit: Payer: Self-pay

## 2023-04-26 MED ORDER — FUROSEMIDE 40 MG PO TABS: 40.0000 mg | ORAL_TABLET | Freq: Two times a day (BID) | ORAL | 0 refills | Status: DC

## 2023-05-10 ENCOUNTER — Inpatient Hospital Stay: Payer: PPO | Attending: Oncology

## 2023-05-10 DIAGNOSIS — C50812 Malignant neoplasm of overlapping sites of left female breast: Secondary | ICD-10-CM | POA: Diagnosis present

## 2023-05-10 DIAGNOSIS — Z79811 Long term (current) use of aromatase inhibitors: Secondary | ICD-10-CM | POA: Insufficient documentation

## 2023-05-10 DIAGNOSIS — Z170 Estrogen receptor positive status [ER+]: Secondary | ICD-10-CM | POA: Diagnosis not present

## 2023-05-10 DIAGNOSIS — Z9221 Personal history of antineoplastic chemotherapy: Secondary | ICD-10-CM | POA: Diagnosis not present

## 2023-05-10 DIAGNOSIS — Z95828 Presence of other vascular implants and grafts: Secondary | ICD-10-CM

## 2023-05-10 DIAGNOSIS — Z923 Personal history of irradiation: Secondary | ICD-10-CM | POA: Insufficient documentation

## 2023-05-10 DIAGNOSIS — Z9012 Acquired absence of left breast and nipple: Secondary | ICD-10-CM | POA: Diagnosis not present

## 2023-05-10 DIAGNOSIS — Z452 Encounter for adjustment and management of vascular access device: Secondary | ICD-10-CM | POA: Insufficient documentation

## 2023-05-10 MED ORDER — HEPARIN SOD (PORK) LOCK FLUSH 100 UNIT/ML IV SOLN
500.0000 [IU] | Freq: Once | INTRAVENOUS | Status: AC
  Administered 2023-05-10: 500 [IU] via INTRAVENOUS
  Filled 2023-05-10: qty 5

## 2023-05-10 MED ORDER — SODIUM CHLORIDE 0.9% FLUSH
10.0000 mL | INTRAVENOUS | Status: DC | PRN
  Administered 2023-05-10: 10 mL via INTRAVENOUS
  Filled 2023-05-10: qty 10

## 2023-05-10 NOTE — Patient Instructions (Signed)

## 2023-05-26 ENCOUNTER — Other Ambulatory Visit: Payer: Self-pay | Admitting: Cardiology

## 2023-06-05 ENCOUNTER — Other Ambulatory Visit: Payer: Self-pay | Admitting: Physician Assistant

## 2023-06-06 ENCOUNTER — Other Ambulatory Visit: Payer: Self-pay | Admitting: *Deleted

## 2023-06-06 MED ORDER — METOPROLOL TARTRATE 25 MG PO TABS: 25.0000 mg | ORAL_TABLET | Freq: Two times a day (BID) | ORAL | 0 refills | Status: DC

## 2023-06-16 ENCOUNTER — Ambulatory Visit: Payer: PPO | Admitting: Radiation Oncology

## 2023-06-21 ENCOUNTER — Other Ambulatory Visit: Payer: Self-pay

## 2023-06-21 MED ORDER — ISOSORBIDE MONONITRATE ER 60 MG PO TB24: 60.0000 mg | ORAL_TABLET | Freq: Every day | ORAL | 1 refills | Status: DC

## 2023-06-27 ENCOUNTER — Other Ambulatory Visit: Payer: Self-pay | Admitting: Cardiology

## 2023-06-29 ENCOUNTER — Other Ambulatory Visit: Payer: Self-pay | Admitting: Physician Assistant

## 2023-07-07 NOTE — Progress Notes (Signed)
Cardiology Office Note:    Date:  07/08/2023  ID:  [name], DOB [birth year], MRN [id number] PCP: Corky Downs, MD  [facility] Providers Cardiologist:  Rollene Rotunda, MD Cardiology APP:  Beatrice Lecher, PA-C       Patient Profile:      Hypertension  RA ultrasound 07/2014: neg for RA stenosis  HFimpEF (heart failure with improved ejection fraction)  Non-ischemic cardiomyopathy // Admx in 06/2014 w/ acute HF in setting of HTN emergency  TTE (06/2014): EF 20-25 TTE 2016: EF 50-55 // TTE 02/2019: EF 50-55  TTE 02/28/20: EF 60-65, no RWMA, mild LVH, GR 1 DD, normal RVSF, RVSP 30.5, mild aortic valve sclerosis without stenosis  True allergy to Hydralazine // Intol to Carvedilol  Myoview 7/15:  No scar, No ischemia  Chronic kidney disease (eGFR 21) Diabetes mellitus  Aortic atherosclerosis  Breast CA S/p L Mastectomy 10/2019 Chemotherapy >> Taxotere, Cytoxan, Aloxi, Fulphila            Discussed the use of AI scribe software for clinical note transcription with the patient, who gave verbal consent to proceed.  History of Present Illness   The patient, a 67 year old female with a history of CHF, hypertension, chronic kidney disease, and breast cancer, presents for her yearly follow-up. She is excited today. Her 1st granddaughter was born yesterday. She is here with her son today. She was taken off amlodipine in the past for low blood pressure. She remains on telmisartan. She is due to see her nephrologist soon. She has not had chest pain, syncope, orthopnea, significant edema.     ROS: See HPI    Studies Reviewed:       EKG: Sinus brady, HR 59, normal axis, LVH with repol abnormality, QTc 467, no change compared with prior ECG.  Risk Assessment/Calculations:             Physical Exam:   VS:  BP 120/84   Pulse (!) 59   Ht 5' (1.524 m)   Wt 198 lb 12.8 oz (90.2 kg)   SpO2 95%   BMI 38.83 kg/m    Wt Readings from Last 3 Encounters:  07/08/23 198 lb 12.8 oz (90.2 kg)   02/09/23 195 lb 6.4 oz (88.6 kg)  08/09/22 190 lb 12.8 oz (86.5 kg)    GEN: Well nourished, well developed in no acute distress NECK: No HJR CARDIAC: RRR, no murmurs, rubs, gallops RESPIRATORY:  Clear to auscultation without rales, wheezing or rhonchi  ABDOMEN: Soft EXTREMITIES:  trace-1+ bilateral ankle edema     Assessment and Plan:  Hypertensive heart disease with chronic combined systolic and diastolic congestive heart failure (HCC) Controlled with Lasix 40mg  BID, Imdur 60mg  daily, Metoprolol tartrate 25mg  BID, and Telmisartan 20mg  daily. Patient reports occasional higher readings at home. -Continue current medications as noted.  Stage 3a chronic kidney disease (HCC) Last creatinine 2.44, GFR 21. Patient is followed by nephrology. -Order comprehensive metabolic panel today. -Continue Telmisartan 20mg  daily unless advised otherwise by nephrology.  Chronic heart failure with preserved ejection fraction (HCC) Last EF was 60-65% in 2021. NYHA class 2B. Volume status stable. -Continue Lasix 40mg  BID, Imdur 60mg  daily, Metoprolol tartrate 25mg  BID, and Telmisartan 20mg  daily.  Hypercholesterolemia Mildly elevated triglycerides, optimal LDL. Patient has been taking fish oil and has made dietary changes. -Order fasting lipid panel today. -Continue Lipitor 40mg  daily and fish oil 1g BID.  Aortic atherosclerosis (HCC) Patient is on Aspirin 81mg  daily and Lipitor 40mg  daily for risk  factor management. -Continue Aspirin 81mg  daily and Lipitor 40mg  daily.       Dispo:  Return in about 1 year (around 07/07/2024) for Routine Follow Up, w/ Tereso Newcomer, PA-C.  Signed, Tereso Newcomer, PA-C

## 2023-07-08 ENCOUNTER — Ambulatory Visit: Payer: PPO | Admitting: Physician Assistant

## 2023-07-08 ENCOUNTER — Other Ambulatory Visit: Payer: Self-pay

## 2023-07-08 ENCOUNTER — Encounter: Payer: Self-pay | Admitting: Physician Assistant

## 2023-07-08 VITALS — BP 120/84 | HR 59 | Ht 60.0 in | Wt 198.8 lb

## 2023-07-08 DIAGNOSIS — N1831 Chronic kidney disease, stage 3a: Secondary | ICD-10-CM | POA: Diagnosis not present

## 2023-07-08 DIAGNOSIS — I110 Hypertensive heart disease with heart failure: Secondary | ICD-10-CM

## 2023-07-08 DIAGNOSIS — I5042 Chronic combined systolic (congestive) and diastolic (congestive) heart failure: Secondary | ICD-10-CM | POA: Diagnosis not present

## 2023-07-08 DIAGNOSIS — E7800 Pure hypercholesterolemia, unspecified: Secondary | ICD-10-CM | POA: Diagnosis not present

## 2023-07-08 DIAGNOSIS — I5032 Chronic diastolic (congestive) heart failure: Secondary | ICD-10-CM

## 2023-07-08 DIAGNOSIS — I700 Atherosclerosis of aorta: Secondary | ICD-10-CM

## 2023-07-08 MED ORDER — FUROSEMIDE 40 MG PO TABS: 40.0000 mg | ORAL_TABLET | Freq: Two times a day (BID) | ORAL | 3 refills | Status: DC

## 2023-07-08 MED ORDER — METOPROLOL TARTRATE 25 MG PO TABS: 25.0000 mg | ORAL_TABLET | Freq: Two times a day (BID) | ORAL | 3 refills | Status: DC

## 2023-07-08 MED ORDER — TELMISARTAN 20 MG PO TABS: 20.0000 mg | ORAL_TABLET | Freq: Every day | ORAL | 3 refills | Status: DC

## 2023-07-08 MED ORDER — ATORVASTATIN CALCIUM 40 MG PO TABS: 40.0000 mg | ORAL_TABLET | Freq: Every day | ORAL | 3 refills | Status: DC

## 2023-07-08 MED ORDER — ISOSORBIDE MONONITRATE ER 60 MG PO TB24: 60.0000 mg | ORAL_TABLET | Freq: Every day | ORAL | 3 refills | Status: DC

## 2023-07-08 NOTE — Assessment & Plan Note (Signed)
Controlled with Lasix 40mg  BID, Imdur 60mg  daily, Metoprolol tartrate 25mg  BID, and Telmisartan 20mg  daily. Patient reports occasional higher readings at home. -Continue current medications as noted.

## 2023-07-08 NOTE — Assessment & Plan Note (Signed)
Last EF was 60-65% in 2021. NYHA class 2B. Volume status stable. -Continue Lasix 40mg  BID, Imdur 60mg  daily, Metoprolol tartrate 25mg  BID, and Telmisartan 20mg  daily.

## 2023-07-08 NOTE — Assessment & Plan Note (Signed)
Patient is on Aspirin 81mg  daily and Lipitor 40mg  daily for risk factor management. -Continue Aspirin 81mg  daily and Lipitor 40mg  daily.

## 2023-07-08 NOTE — Assessment & Plan Note (Signed)
Last creatinine 2.44, GFR 21. Patient is followed by nephrology. -Order comprehensive metabolic panel today. -Continue Telmisartan 20mg  daily unless advised otherwise by nephrology.

## 2023-07-08 NOTE — Assessment & Plan Note (Signed)
Mildly elevated triglycerides, optimal LDL. Patient has been taking fish oil and has made dietary changes. -Order fasting lipid panel today. -Continue Lipitor 40mg  daily and fish oil 1g BID.

## 2023-07-08 NOTE — Patient Instructions (Signed)
Medication Instructions:  Your physician recommends that you continue on your current medications as directed. Please refer to the Current Medication list given to you today.  *If you need a refill on your cardiac medications before your next appointment, please call your pharmacy*   Lab Work: TODAY:  CMET & LIPID  If you have labs (blood work) drawn today and your tests are completely normal, you will receive your results only by: MyChart Message (if you have MyChart) OR A paper copy in the mail If you have any lab test that is abnormal or we need to change your treatment, we will call you to review the results.   Testing/Procedures: None ordered   Follow-Up: At [facility], you and your health needs are our priority.  As part of our continuing mission to provide you with exceptional heart care, we have created designated Provider Care Teams.  These Care Teams include your primary Cardiologist (physician) and Advanced Practice Providers (APPs -  Physician Assistants and Nurse Practitioners) who all work together to provide you with the care you need, when you need it.  We recommend signing up for the patient portal called "MyChart".  Sign up information is provided on this After Visit Summary.  MyChart is used to connect with patients for Virtual Visits (Telemedicine).  Patients are able to view lab/test results, encounter notes, upcoming appointments, etc.  Non-urgent messages can be sent to your provider as well.   To learn more about what you can do with MyChart, go to [email or web address].    Your next appointment:   12 month(s)  Provider:   Tereso Newcomer, PA-C     Other Instructions

## 2023-07-09 LAB — COMPREHENSIVE METABOLIC PANEL
ALT: 32 IU/L (ref 0–32)
AST: 25 IU/L (ref 0–40)
Albumin: 4.3 g/dL (ref 3.9–4.9)
Alkaline Phosphatase: 83 IU/L (ref 44–121)
BUN/Creatinine Ratio: 21 (ref 12–28)
BUN: 47 mg/dL — ABNORMAL HIGH (ref 8–27)
Bilirubin Total: 0.6 mg/dL (ref 0.0–1.2)
CO2: 21 mmol/L (ref 20–29)
Calcium: 9.8 mg/dL (ref 8.7–10.3)
Chloride: 100 mmol/L (ref 96–106)
Creatinine, Ser: 2.27 mg/dL — ABNORMAL HIGH (ref 0.57–1.00)
Globulin, Total: 2.6 g/dL (ref 1.5–4.5)
Glucose: 222 mg/dL — ABNORMAL HIGH (ref 70–99)
Potassium: 4.1 mmol/L (ref 3.5–5.2)
Sodium: 140 mmol/L (ref 134–144)
Total Protein: 6.9 g/dL (ref 6.0–8.5)
eGFR: 23 mL/min/{1.73_m2} — ABNORMAL LOW (ref >59)

## 2023-07-09 LAB — LIPID PANEL
Chol/HDL Ratio: 4.2 ratio (ref 0.0–4.4)
Cholesterol, Total: 150 mg/dL (ref 100–199)
HDL: 36 mg/dL — ABNORMAL LOW (ref >39)
LDL Chol Calc (NIH): 73 mg/dL (ref 0–99)
Triglycerides: 252 mg/dL — ABNORMAL HIGH (ref 0–149)
VLDL Cholesterol Cal: 41 mg/dL — ABNORMAL HIGH (ref 5–40)

## 2023-07-14 ENCOUNTER — Telehealth: Payer: Self-pay | Admitting: Physician Assistant

## 2023-07-14 MED ORDER — FISH OIL 1000 MG PO CPDR: 2000.0000 mg | DELAYED_RELEASE_CAPSULE | Freq: Two times a day (BID) | ORAL | 0 refills | Status: AC

## 2023-07-14 NOTE — Telephone Encounter (Signed)
Beatrice Lecher, PA-C 07/11/2023  8:40 AM EDT     Results sent to Jacqualyn Posey via MyChart. See MyChart comments below.   [name] Your BUN, Creatinine (kidney function) is stable. Your potassium, liver enzymes (AST, ALT) are normal. Your glucose (sugar) is elevated. Your LDL cholesterol is optimal. Your triglycerides are still elevated. You can increase Fish Oil to 2000 mg twice daily. Also, continue to work on diet to reduce triglycerides. Tereso Newcomer, PA-C    The patient has been notified of the result and verbalized understanding.  All questions (if any) were answered. Frutoso Schatz, RN 07/14/2023 9:30 AM  f

## 2023-07-14 NOTE — Telephone Encounter (Signed)
° °  Pt is returning call to get lab result °

## 2023-07-14 NOTE — Telephone Encounter (Signed)
-----   Message from Laurann Montana sent at 07/14/2023  6:49 AM EDT ----- Covering [name]'s inbox today. Received notification of unreviewed Mychart results. Please see [name]'s result note and relay recommendations to patient. Thank you!

## 2023-08-10 ENCOUNTER — Inpatient Hospital Stay: Payer: PPO | Admitting: Oncology

## 2023-08-10 ENCOUNTER — Inpatient Hospital Stay: Payer: PPO

## 2023-08-17 ENCOUNTER — Inpatient Hospital Stay: Payer: PPO

## 2023-08-17 ENCOUNTER — Other Ambulatory Visit: Payer: Self-pay | Admitting: *Deleted

## 2023-08-17 ENCOUNTER — Inpatient Hospital Stay (HOSPITAL_BASED_OUTPATIENT_CLINIC_OR_DEPARTMENT_OTHER): Payer: PPO | Admitting: Oncology

## 2023-08-17 ENCOUNTER — Encounter: Payer: Self-pay | Admitting: Oncology

## 2023-08-17 VITALS — BP 135/85 | HR 66 | Temp 97.1°F | Resp 18 | Ht 60.0 in | Wt 201.2 lb

## 2023-08-17 DIAGNOSIS — Z79811 Long term (current) use of aromatase inhibitors: Secondary | ICD-10-CM | POA: Insufficient documentation

## 2023-08-17 DIAGNOSIS — Z7983 Long term (current) use of bisphosphonates: Secondary | ICD-10-CM

## 2023-08-17 DIAGNOSIS — Z923 Personal history of irradiation: Secondary | ICD-10-CM | POA: Diagnosis not present

## 2023-08-17 DIAGNOSIS — Z08 Encounter for follow-up examination after completed treatment for malignant neoplasm: Secondary | ICD-10-CM

## 2023-08-17 DIAGNOSIS — Z170 Estrogen receptor positive status [ER+]: Secondary | ICD-10-CM

## 2023-08-17 DIAGNOSIS — Z9221 Personal history of antineoplastic chemotherapy: Secondary | ICD-10-CM | POA: Diagnosis not present

## 2023-08-17 DIAGNOSIS — M8580 Other specified disorders of bone density and structure, unspecified site: Secondary | ICD-10-CM | POA: Diagnosis present

## 2023-08-17 DIAGNOSIS — C50912 Malignant neoplasm of unspecified site of left female breast: Secondary | ICD-10-CM | POA: Insufficient documentation

## 2023-08-17 DIAGNOSIS — Z87891 Personal history of nicotine dependence: Secondary | ICD-10-CM | POA: Diagnosis not present

## 2023-08-17 DIAGNOSIS — Z9012 Acquired absence of left breast and nipple: Secondary | ICD-10-CM | POA: Insufficient documentation

## 2023-08-17 DIAGNOSIS — Z853 Personal history of malignant neoplasm of breast: Secondary | ICD-10-CM

## 2023-08-17 LAB — COMPREHENSIVE METABOLIC PANEL
ALT: 25 U/L (ref 0–44)
AST: 22 U/L (ref 15–41)
Albumin: 3.8 g/dL (ref 3.5–5.0)
Alkaline Phosphatase: 79 U/L (ref 38–126)
Anion gap: 7 (ref 5–15)
BUN: 40 mg/dL — ABNORMAL HIGH (ref 8–23)
CO2: 26 mmol/L (ref 22–32)
Calcium: 9.2 mg/dL (ref 8.9–10.3)
Chloride: 101 mmol/L (ref 98–111)
Creatinine, Ser: 1.79 mg/dL — ABNORMAL HIGH (ref 0.44–1.00)
GFR, Estimated: 31 mL/min — ABNORMAL LOW (ref >60)
Glucose, Bld: 312 mg/dL — ABNORMAL HIGH (ref 70–99)
Potassium: 3.6 mmol/L (ref 3.5–5.1)
Sodium: 134 mmol/L — ABNORMAL LOW (ref 135–145)
Total Bilirubin: <0.1 mg/dL — ABNORMAL LOW (ref 0.3–1.2)
Total Protein: 7.1 g/dL (ref 6.5–8.1)

## 2023-08-17 LAB — CBC WITH DIFFERENTIAL/PLATELET
Abs Immature Granulocytes: 0.01 10*3/uL (ref 0.00–0.07)
Basophils Absolute: 0.1 10*3/uL (ref 0.0–0.1)
Basophils Relative: 1 %
Eosinophils Absolute: 0.2 10*3/uL (ref 0.0–0.5)
Eosinophils Relative: 3 %
HCT: 42.9 % (ref 36.0–46.0)
Hemoglobin: 13.8 g/dL (ref 12.0–15.0)
Immature Granulocytes: 0 %
Lymphocytes Relative: 27 %
Lymphs Abs: 1.5 10*3/uL (ref 0.7–4.0)
MCH: 28.6 pg (ref 26.0–34.0)
MCHC: 32.2 g/dL (ref 30.0–36.0)
MCV: 89 fL (ref 80.0–100.0)
Monocytes Absolute: 0.5 10*3/uL (ref 0.1–1.0)
Monocytes Relative: 9 %
Neutro Abs: 3.3 10*3/uL (ref 1.7–7.7)
Neutrophils Relative %: 60 %
Platelets: 170 10*3/uL (ref 150–400)
RBC: 4.82 MIL/uL (ref 3.87–5.11)
RDW: 14.7 % (ref 11.5–15.5)
WBC: 5.5 10*3/uL (ref 4.0–10.5)
nRBC: 0 % (ref 0.0–0.2)

## 2023-08-17 MED ORDER — SODIUM CHLORIDE 0.9 % IV SOLN
Freq: Once | INTRAVENOUS | Status: AC
  Filled 2023-08-17: qty 250

## 2023-08-17 MED ORDER — LETROZOLE 2.5 MG PO TABS: 2.5000 mg | ORAL_TABLET | Freq: Every day | ORAL | 2 refills | Status: DC

## 2023-08-17 MED ORDER — GABAPENTIN 100 MG PO CAPS: ORAL_CAPSULE | ORAL | 2 refills | Status: DC

## 2023-08-17 MED ORDER — ZOLEDRONIC ACID 4 MG/5ML IV CONC
3.0000 mg | INTRAVENOUS | Status: DC
  Administered 2023-08-17: 3 mg via INTRAVENOUS
  Filled 2023-08-17: qty 3.75

## 2023-08-17 NOTE — Progress Notes (Signed)
Hematology/Oncology Consult note [facility]  [phone] [phone]  Patient Care Team: Corky Downs, MD as PCP - General (Internal Medicine) Rollene Rotunda, MD as PCP - Cardiology (Cardiology) Dorisann Frames, MD as Referring Physician (Endocrinology) Casimer Lanius, MD as Consulting Physician (Rheumatology) Carmina Miller, MD as Referring Physician (Radiation Oncology) Creig Hines, MD as Consulting Physician (Oncology) Griselda Miner, MD as Consulting Physician (General Surgery) Jim Like, RN as Registered Nurse Scarlett Presto, RN (Inactive) as Registered Nurse Janalyn Harder, MD (Inactive) as Consulting Physician (Dermatology) Kennon Rounds as Physician Assistant (Cardiology)   Name of the patient: [name]  [id number]  1956-08-26   Date of visit: 08/17/23  Diagnosis-  Invasive lobular carcinoma of the left breast pathological prognostic stage Ib pT2 pN2 acM0 ER/PR positive HER-2/neu negative s/p mastectomy and targeted node dissection   Chief complaint/ Reason for visit-routine follow-up of breast cancer on letrozole and to receive Zometa  Heme/Onc history: Patient is a 67 year old female diagnosed with left breast cancer in October 2020.  Mammogram showed irregular mass with increased vascularity measuring 3.1 x 2.4 x 4.5 cm.  The nodularity extended towards the nipple measuring at least 5.4 cm.  Single abnormal lymph node in the left axilla with cortical thickness of 0.7 cm.  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     PET scan showed hypermetabolic left axillary lymph nodes which was reviewed at tumor board and they were at least found to be 3-4.  Retroareolar left breast mass 2.5 x 3.2 cm in size.  No evidence of distant metastatic disease. Final pathology  showed 2 separate tumors one which was 4.5 cm and the other one was 4.4 cm in size.  Grade 2 ER/PR positive HER-2/neu negative with negative margins. 5 out of 11 lymph nodes were positive for malignancy.  Extranodal extension present.  Largest size of metastatic deposit 14 mm.  mpT2 mpN2   Patient is not a candidate for anthracycline-based chemotherapy per cardiology.  Adjuvant TC chemotherapy completed on 03/11/2020.  She completed adjuvant radiation therapy as well and started Arimidex which she could not tolerate and was to Aromasin on 06/19/2020.  She was subsequently switched to letrozole    Interval history-patient feels poorly overall from her psoriasis which is not only affecting her skin but also her joints.  She follows up with rheumatology for this.  She has been compliant with letrozole calcium and vitamin D  ECOG PS- 2 Pain scale- 5 Opioid associated constipation- no  Review of systems- Review of Systems  Constitutional:  Positive for malaise/fatigue. Negative for chills, fever and weight loss.  HENT:  Negative for congestion, ear discharge and nosebleeds.   Eyes:  Negative for blurred vision.  Respiratory:  Negative for cough, hemoptysis, sputum production, shortness of breath and wheezing.   Cardiovascular:  Negative for chest pain, palpitations, orthopnea and claudication.  Gastrointestinal:  Negative for abdominal pain, blood in stool, constipation, diarrhea, heartburn, melena, nausea and vomiting.  Genitourinary:  Negative for dysuria, flank pain, frequency, hematuria and urgency.  Musculoskeletal:  Positive for joint pain. Negative for back pain and myalgias.  Skin:  Negative for rash.  Neurological:  Negative for dizziness, tingling, focal weakness, seizures, weakness and headaches.  Endo/Heme/Allergies:  Does not bruise/bleed easily.  Psychiatric/Behavioral:  Negative for depression and  suicidal ideas. The patient does not have insomnia.       Allergies  Allergen  Reactions   Hydralazine Swelling    Eye Swelling   Other Other (See Comments)    Unknown blood pressure medication caused face swelling     Past Medical History:  Diagnosis Date   Arthritis of both feet    Gout   Cancer (HCC)    Breast Cancer on left   Chronic combined systolic and diastolic CHF (congestive heart failure) (HCC)    a. Echo (06/2014):  Severe LVH, EF 20-25%, no RWMA, Gr 3 DD, trivial MR, mild LAE, mild RVE, PASP 38 mmHg .>> b. Echo 2/16 EF 50-55% //  // c. Echo 02/2019: EF 50-55, mod LVH, Gr 1 DD, mild LAE // Echocardiogram 02/2020: EF 60-65, no RWMA, mild LVH, Gr 1 DD, normal RVSF, RVSP 30.5    CKD (chronic kidney disease), stage III (HCC)    now stage 4   Diabetes mellitus type 2 in obese    Diverticular disease    Dyspnea    Family history of breast cancer    Family history of leukemia    Family history of lung cancer    Family history of testicular cancer    Family history of throat cancer    Fatty liver    History of GI diverticular bleed    Hyperlipidemia    Hyperphosphatemia    Hypertension    Hypertensive heart disease    a. Renal Artery Duplex (8/15):  no RAS   Iron deficiency anemia    NICM (nonischemic cardiomyopathy) (HCC)    a. Nuclear (06/2014):  EF 32%, apical thinning, no definite scar or ischemia;  b. Echo (2/16):  Mild LVH, EF 50-55%, Gr 2 DD, no RWMA   Obesity    Personal history of chemotherapy    Personal history of radiation therapy    PONV (postoperative nausea and vomiting)    after D and C   Proteinuria    Psoriasis    PUD (peptic ulcer disease)    Secondary hyperparathyroidism, renal (HCC)    Tobacco abuse    UTI (lower urinary tract infection)    Vaginal dryness, menopausal    Vaginal yeast infection      Past Surgical History:  Procedure Laterality Date   BREAST BIOPSY Left 2020   positive    COLONOSCOPY     DILATION AND CURETTAGE OF UTERUS     MASTECTOMY Left 2020   MASTECTOMY W/ SENTINEL NODE BIOPSY Left 11/12/2019    MASTECTOMY W/ SENTINEL NODE BIOPSY Left 11/12/2019   Procedure: LEFT MASTECTOMY WITH SENTINEL LYMPH NODE BIOPSY AND TARGETED NODE DISSECTION;  Surgeon: Griselda Miner, MD;  Location: [facility] OR;  Service: General;  Laterality: Left;   None     PORTA CATH INSERTION N/A 12/11/2019   Procedure: PORTA CATH INSERTION;  Surgeon: Renford Dills, MD;  Location: [facility] INVASIVE CV LAB;  Service: Cardiovascular;  Laterality: N/A;    Social History   Socioeconomic History   Marital status: Widowed    Spouse name: Not on file   Number of children: 2   Years of education: 7   Highest education level: Not on file  Occupational History   Not on file  Tobacco Use   Smoking status: Former    Current packs/day: 0.00    Types: Cigarettes    Quit date: 07/05/2014    Years since quitting: 9.1   Smokeless tobacco: Never  Vaping Use  Vaping status: Never Used  Substance and Sexual Activity   Alcohol use: No   Drug use: No   Sexual activity: Not Currently  Other Topics Concern   Not on file  Social History Narrative   Currently lives in a house with her husband.    Fun: Watch TV.   Denies religious beliefs effecting health care.    Social Determinants of Health   Financial Resource Strain: Not on file  Food Insecurity: Not on file  Transportation Needs: Not on file  Physical Activity: Not on file  Stress: Not on file  Social Connections: Not on file  Intimate Partner Violence: Not on file    Family History  Problem Relation Age of Onset   Heart attack Paternal Grandfather    Stroke Paternal Grandmother    Hypertension Paternal Grandmother    Kidney disease Mother    Diabetes Mother    Testicular cancer Father        diagnosed older than 27   Coronary artery disease Other    Hypertension Other    Breast cancer Sister        diagnosed late 34s, negative genetics   Leukemia Brother        chronic   Throat cancer Brother    Pneumonia Sister 2   Lung cancer Paternal Uncle         diagnosed over 62     Current Outpatient Medications:    acetaminophen (TYLENOL) 500 MG tablet, Take 1,000 mg by mouth every 6 (six) hours as needed for mild pain, moderate pain or headache. For pain, Disp: , Rfl:    aspirin 81 MG EC tablet, Take 1 tablet (81 mg total) by mouth daily., Disp: 30 tablet, Rfl:    atorvastatin (LIPITOR) 40 MG tablet, Take 1 tablet (40 mg total) by mouth daily., Disp: 90 tablet, Rfl: 3   B-D ULTRAFINE III SHORT PEN 31G X 8 MM MISC, , Disp: , Rfl: 0   blood glucose meter kit and supplies KIT, Dispense based on patient and insurance preference. Use up to four times daily as directed. (FOR ICD-9 250.00, 250.01)., Disp: 1 each, Rfl: 0   calcitRIOL (ROCALTROL) 0.25 MCG capsule, Take 0.25 mcg by mouth 3 (three) times a week., Disp: , Rfl:    calcium carbonate (OSCAL) 1500 (600 Ca) MG TABS tablet, Take 1 tablet by mouth 2 (two) times daily with a meal., Disp: , Rfl:    Colchicine 0.6 MG CAPS, Take 0.6 mg by mouth every other day., Disp: , Rfl:    COSENTYX SENSOREADY PEN 150 MG/ML SOAJ, Inject 150 mg into the skin every 30 (thirty) days., Disp: , Rfl:    furosemide (LASIX) 40 MG tablet, Take 1 tablet (40 mg total) by mouth 2 (two) times daily., Disp: 180 tablet, Rfl: 3   insulin aspart (NOVOLOG FLEXPEN) 100 UNIT/ML FlexPen, Inject 5 Units into the skin 3 (three) times daily with meals. Additional insulin (5 units PLUS) CBG 70 - 120: 0 units CBG 121 - 150: 1 unit CBG 151 - 200: 2 units CBG 201 - 250: 3 units CBG 251 - 350: 4 units CBG 351 - 400: 6 units, Disp: 15 mL, Rfl: 11   insulin glargine (LANTUS) 100 UNIT/ML injection, Inject 25 Units into the skin at bedtime., Disp: , Rfl:    isosorbide mononitrate (IMDUR) 60 MG 24 hr tablet, Take 1 tablet (60 mg total) by mouth daily., Disp: 90 tablet, Rfl: 3   leflunomide (ARAVA) 20 MG tablet,  Take 20 mg by mouth daily., Disp: , Rfl:    lidocaine-prilocaine (EMLA) cream, Apply to affected area once, Disp: 30 g, Rfl: 3   metoprolol  tartrate (LOPRESSOR) 25 MG tablet, Take 1 tablet (25 mg total) by mouth 2 (two) times daily., Disp: 180 tablet, Rfl: 3   Omega-3 Fatty Acids (FISH OIL) 1000 MG CPDR, Take 2,000 mg by mouth 2 (two) times daily., Disp: , Rfl: 0   ONE TOUCH ULTRA TEST test strip, 1 each by Other route 4 (four) times daily., Disp: , Rfl: 1   ONETOUCH DELICA LANCETS 33G MISC, Inject 33 g into the skin 4 (four) times daily., Disp: , Rfl: 1   senna (SENOKOT) 8.6 MG tablet, Take 1 tablet by mouth daily., Disp: , Rfl:    telmisartan (MICARDIS) 20 MG tablet, Take 1 tablet (20 mg total) by mouth daily., Disp: 90 tablet, Rfl: 3   gabapentin (NEURONTIN) 100 MG capsule, TAKE 2 CAPSULES BY MOUTH 3 TIMES A DAY, Disp: 180 capsule, Rfl: 2   letrozole (FEMARA) 2.5 MG tablet, Take 1 tablet (2.5 mg total) by mouth daily., Disp: 90 tablet, Rfl: 2  Physical exam:  Vitals:   08/17/23 0835  BP: 135/85  Pulse: 66  Resp: 18  Temp: (!) 97.1 F (36.2 C)  TempSrc: Tympanic  SpO2: 97%  Weight: 201 lb 3.2 oz (91.3 kg)  Height: 5' (1.524 m)   Physical Exam Cardiovascular:     Rate and Rhythm: Normal rate and regular rhythm.     Heart sounds: Normal heart sounds.  Pulmonary:     Effort: Pulmonary effort is normal.     Breath sounds: Normal breath sounds.  Abdominal:     General: Bowel sounds are normal.     Palpations: Abdomen is soft.  Skin:    General: Skin is warm and dry.  Neurological:     Mental Status: She is alert and oriented to person, place, and time.   Breast exam: Patient is s/p left mastectomy without reconstruction.  No evidence of chest wall recurrence.  No palpable bilateral axillary adenopathy.  No palpable masses in the right breast.     Latest Ref Rng & Units 08/17/2023    8:23 AM  CMP  Glucose 70 - 99 mg/dL 161   BUN 8 - 23 mg/dL 40   Creatinine 0.96 - 1.00 mg/dL 0.45   Sodium 409 - 811 mmol/L 134   Potassium 3.5 - 5.1 mmol/L 3.6   Chloride 98 - 111 mmol/L 101   CO2 22 - 32 mmol/L 26   Calcium 8.9  - 10.3 mg/dL 9.2   Total Protein 6.5 - 8.1 g/dL 7.1   Total Bilirubin 0.3 - 1.2 mg/dL <9.1   Alkaline Phos 38 - 126 U/L 79   AST 15 - 41 U/L 22   ALT 0 - 44 U/L 25       Latest Ref Rng & Units 08/17/2023    8:23 AM  CBC  WBC 4.0 - 10.5 K/uL 5.5   Hemoglobin 12.0 - 15.0 g/dL 47.8   Hematocrit 29.5 - 46.0 % 42.9   Platelets 150 - 400 K/uL 170     Assessment and plan- Patient is a 67 y.o. female  with pathological prognostic stage Ib invasive lobular carcinoma pT2 pN2 acM0 ER/PR positive HER-2/neu negative s/p left mastectomy and targeted node dissection.  She has completed 4 cycles of adjuvant TC chemotherapy And adjuvant radiation therapy.   Clinically patient is doing well with no concerning  signs and symptoms of recurrence based on today's exam.  She had a mammogram in October 2023 which was unremarkable and we will schedule her mammogram for this year.  Her bone density scan in April 2024 showed osteopenia but a 10-year probability for major osteoporotic fracture with less than 20% and hip fracture less than 3%.  She is getting adjuvant Zometa as well and this will be her last dose today.  Arthralgias likely secondary to psoriasis.  We have changed multiple aromatase inhibitors for her and she is currently on letrozole.  Continue to monitor   Visit Diagnosis 1. Encounter for monitoring zoledronic acid therapy   2. Encounter for follow-up surveillance of breast cancer   3. Use of letrozole (Femara)      Dr. Owens Shark, MD, MPH [facility] at [facility] [phone] 08/17/2023 9:11 AM

## 2023-08-17 NOTE — Progress Notes (Signed)
Pt needs refill of gabapentin and letrozole, approved refill

## 2023-09-28 MED ORDER — AMLODIPINE BESYLATE 5 MG PO TABS: 5.0000 mg | ORAL_TABLET | Freq: Every day | ORAL | 3 refills | Status: DC

## 2023-09-28 NOTE — Addendum Note (Signed)
Addended byAlben Spittle, Lorin Picket T on: 09/28/2023 08:50 PM   Modules accepted: Orders

## 2023-10-10 ENCOUNTER — Ambulatory Visit
Admission: RE | Admit: 2023-10-10 | Discharge: 2023-10-10 | Disposition: A | Discharge status: Home / Self Care | Payer: PPO | Source: Ambulatory Visit | Attending: Oncology | Admitting: Oncology

## 2023-10-10 DIAGNOSIS — Z9012 Acquired absence of left breast and nipple: Secondary | ICD-10-CM | POA: Insufficient documentation

## 2023-10-10 DIAGNOSIS — Z170 Estrogen receptor positive status [ER+]: Secondary | ICD-10-CM

## 2023-10-10 DIAGNOSIS — Z853 Personal history of malignant neoplasm of breast: Secondary | ICD-10-CM | POA: Diagnosis not present

## 2023-10-10 DIAGNOSIS — Z1231 Encounter for screening mammogram for malignant neoplasm of breast: Secondary | ICD-10-CM | POA: Insufficient documentation

## 2023-10-11 MED ORDER — AMLODIPINE BESYLATE 10 MG PO TABS: 10.0000 mg | ORAL_TABLET | Freq: Every day | ORAL | 3 refills | Status: DC

## 2023-10-11 NOTE — Telephone Encounter (Signed)
Increase Amlodipine to 10 mg once daily. Tereso Newcomer, PA-C    10/11/2023 1:33 PM

## 2024-02-17 ENCOUNTER — Ambulatory Visit: Payer: PPO | Admitting: Oncology

## 2024-02-22 ENCOUNTER — Other Ambulatory Visit: Payer: Self-pay

## 2024-02-22 ENCOUNTER — Inpatient Hospital Stay

## 2024-02-22 ENCOUNTER — Other Ambulatory Visit: Payer: Self-pay | Admitting: Cardiology

## 2024-02-22 ENCOUNTER — Encounter: Payer: Self-pay | Admitting: Oncology

## 2024-02-22 ENCOUNTER — Inpatient Hospital Stay: Payer: PPO | Attending: Oncology | Admitting: Oncology

## 2024-02-22 ENCOUNTER — Telehealth: Payer: Self-pay | Admitting: *Deleted

## 2024-02-22 VITALS — BP 103/66 | HR 68 | Temp 97.7°F | Resp 18 | Wt 201.0 lb

## 2024-02-22 DIAGNOSIS — Z170 Estrogen receptor positive status [ER+]: Secondary | ICD-10-CM | POA: Insufficient documentation

## 2024-02-22 DIAGNOSIS — Z923 Personal history of irradiation: Secondary | ICD-10-CM | POA: Insufficient documentation

## 2024-02-22 DIAGNOSIS — Z7982 Long term (current) use of aspirin: Secondary | ICD-10-CM | POA: Insufficient documentation

## 2024-02-22 DIAGNOSIS — Z1721 Progesterone receptor positive status: Secondary | ICD-10-CM | POA: Diagnosis not present

## 2024-02-22 DIAGNOSIS — Z9221 Personal history of antineoplastic chemotherapy: Secondary | ICD-10-CM | POA: Diagnosis not present

## 2024-02-22 DIAGNOSIS — Z87891 Personal history of nicotine dependence: Secondary | ICD-10-CM | POA: Insufficient documentation

## 2024-02-22 DIAGNOSIS — Z79899 Other long term (current) drug therapy: Secondary | ICD-10-CM

## 2024-02-22 DIAGNOSIS — Z08 Encounter for follow-up examination after completed treatment for malignant neoplasm: Secondary | ICD-10-CM | POA: Diagnosis not present

## 2024-02-22 DIAGNOSIS — Z79811 Long term (current) use of aromatase inhibitors: Secondary | ICD-10-CM

## 2024-02-22 DIAGNOSIS — Z7969 Long term (current) use of other immunomodulators and immunosuppressants: Secondary | ICD-10-CM | POA: Insufficient documentation

## 2024-02-22 DIAGNOSIS — C50912 Malignant neoplasm of unspecified site of left female breast: Secondary | ICD-10-CM | POA: Insufficient documentation

## 2024-02-22 DIAGNOSIS — Z1732 Human epidermal growth factor receptor 2 negative status: Secondary | ICD-10-CM | POA: Diagnosis not present

## 2024-02-22 DIAGNOSIS — Z95828 Presence of other vascular implants and grafts: Secondary | ICD-10-CM

## 2024-02-22 DIAGNOSIS — Z853 Personal history of malignant neoplasm of breast: Secondary | ICD-10-CM | POA: Diagnosis not present

## 2024-02-22 LAB — CMP (CANCER CENTER ONLY)
ALT: 19 U/L (ref 0–44)
AST: 20 U/L (ref 15–41)
Albumin: 3.9 g/dL (ref 3.5–5.0)
Alkaline Phosphatase: 78 U/L (ref 38–126)
Anion gap: 8 (ref 5–15)
BUN: 35 mg/dL — ABNORMAL HIGH (ref 8–23)
CO2: 24 mmol/L (ref 22–32)
Calcium: 8.8 mg/dL — ABNORMAL LOW (ref 8.9–10.3)
Chloride: 102 mmol/L (ref 98–111)
Creatinine: 2.11 mg/dL — ABNORMAL HIGH (ref 0.44–1.00)
GFR, Estimated: 25 mL/min — ABNORMAL LOW (ref >60)
Glucose, Bld: 150 mg/dL — ABNORMAL HIGH (ref 70–99)
Potassium: 4.1 mmol/L (ref 3.5–5.1)
Sodium: 134 mmol/L — ABNORMAL LOW (ref 135–145)
Total Bilirubin: 0.7 mg/dL (ref 0.0–1.2)
Total Protein: 6.8 g/dL (ref 6.5–8.1)

## 2024-02-22 LAB — CBC WITH DIFFERENTIAL (CANCER CENTER ONLY)
Abs Immature Granulocytes: 0.02 10*3/uL (ref 0.00–0.07)
Basophils Absolute: 0 10*3/uL (ref 0.0–0.1)
Basophils Relative: 1 %
Eosinophils Absolute: 0.3 10*3/uL (ref 0.0–0.5)
Eosinophils Relative: 5 %
HCT: 42.7 % (ref 36.0–46.0)
Hemoglobin: 13.6 g/dL (ref 12.0–15.0)
Immature Granulocytes: 0 %
Lymphocytes Relative: 23 %
Lymphs Abs: 1.4 10*3/uL (ref 0.7–4.0)
MCH: 28.5 pg (ref 26.0–34.0)
MCHC: 31.9 g/dL (ref 30.0–36.0)
MCV: 89.3 fL (ref 80.0–100.0)
Monocytes Absolute: 0.4 10*3/uL (ref 0.1–1.0)
Monocytes Relative: 7 %
Neutro Abs: 3.8 10*3/uL (ref 1.7–7.7)
Neutrophils Relative %: 64 %
Platelet Count: 172 10*3/uL (ref 150–400)
RBC: 4.78 MIL/uL (ref 3.87–5.11)
RDW: 15 % (ref 11.5–15.5)
WBC Count: 5.9 10*3/uL (ref 4.0–10.5)
nRBC: 0 % (ref 0.0–0.2)

## 2024-02-22 MED ORDER — HEPARIN SOD (PORK) LOCK FLUSH 100 UNIT/ML IV SOLN
500.0000 [IU] | Freq: Once | INTRAVENOUS | Status: AC
  Administered 2024-02-22: 500 [IU]
  Filled 2024-02-22: qty 5

## 2024-02-22 MED ORDER — GABAPENTIN 100 MG PO CAPS: ORAL_CAPSULE | ORAL | 2 refills | Status: DC

## 2024-02-22 MED ORDER — LETROZOLE 2.5 MG PO TABS: 2.5000 mg | ORAL_TABLET | Freq: Every day | ORAL | 2 refills | Status: DC

## 2024-02-22 MED ORDER — LIDOCAINE-PRILOCAINE 2.5-2.5 % EX CREA: TOPICAL_CREAM | CUTANEOUS | 3 refills | Status: AC

## 2024-02-22 MED ORDER — SODIUM CHLORIDE 0.9% FLUSH
10.0000 mL | Freq: Once | INTRAVENOUS | Status: AC
  Administered 2024-02-22: 10 mL via INTRAVENOUS
  Filled 2024-02-22: qty 10

## 2024-02-22 NOTE — Telephone Encounter (Signed)
-----   Message from Chriss Czar sent at 02/22/2024 10:56 AM EST ----- Regarding: PRIOR AUTH STARTED- COVER MY MEDS PRIOR AUTH STARTED- COVER MY MEDS for Lidocaine sent to her chart.

## 2024-02-22 NOTE — Progress Notes (Signed)
 Hematology/Oncology Consult note [facility]  [phone] [phone]  Patient Care Team: Corky Downs, MD as PCP - General (Internal Medicine) Rollene Rotunda, MD as PCP - Cardiology (Cardiology) Dorisann Frames, MD as Referring Physician (Endocrinology) Casimer Lanius, MD as Consulting Physician (Rheumatology) Carmina Miller, MD as Referring Physician (Radiation Oncology) Creig Hines, MD as Consulting Physician (Oncology) Griselda Miner, MD as Consulting Physician (General Surgery) Jim Like, RN as Registered Nurse Scarlett Presto, RN (Inactive) as Registered Nurse Janalyn Harder, MD (Inactive) as Consulting Physician (Dermatology) Kennon Rounds as Physician Assistant (Cardiology)   Name of the patient: [name]  [id number]  [birth year]   Date of visit: 02/22/24  Diagnosis-  Invasive lobular carcinoma of the left breast pathological prognostic stage Ib pT2 pN2 acM0 ER/PR positive HER-2/neu negative s/p mastectomy and targeted node dissection   Chief complaint/ Reason for visit-routine follow-up of breast cancer on letrozole  Heme/Onc history: Patient is a 68 year old female diagnosed with left breast cancer in October 2020.  Mammogram showed irregular mass with increased vascularity measuring 3.1 x 2.4 x 4.5 cm.  The nodularity extended towards the nipple measuring at least 5.4 cm.  Single abnormal lymph node in the left axilla with cortical thickness of 0.7 cm.  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     PET scan showed hypermetabolic left axillary lymph nodes which was reviewed at tumor board and they were at least found to be 3-4.  Retroareolar left breast mass 2.5 x 3.2 cm in size.  No evidence of distant metastatic disease. Final pathology showed 2 separate  tumors one which was 4.5 cm and the other one was 4.4 cm in size.  Grade 2 ER/PR positive HER-2/neu negative with negative margins. 5 out of 11 lymph nodes were positive for malignancy.  Extranodal extension present.  Largest size of metastatic deposit 14 mm.  mpT2 mpN2   Patient is not a candidate for anthracycline-based chemotherapy per cardiology.  Adjuvant TC chemotherapy completed on 03/11/2020.  She completed adjuvant radiation therapy as well and started Arimidex which she could not tolerate and was to Aromasin on 06/19/2020.  She was subsequently switched to letrozole.  She completed 3 years of adjuvant Zometa    Interval history-patient has chronic arthritis secondary to her psoriasis for which she follows up with rheumatology.  Denies any breast concerns at this time.    ECOG PS- 2 Pain scale- 0   Review of systems- Review of Systems  Constitutional:  Positive for malaise/fatigue. Negative for chills, fever and weight loss.  HENT:  Negative for congestion, ear discharge and nosebleeds.   Eyes:  Negative for blurred vision.  Respiratory:  Negative for cough, hemoptysis, sputum production, shortness of breath and wheezing.   Cardiovascular:  Negative for chest pain, palpitations, orthopnea and claudication.  Gastrointestinal:  Negative for abdominal pain, blood in stool, constipation, diarrhea, heartburn, melena, nausea and vomiting.  Genitourinary:  Negative for dysuria, flank pain, frequency, hematuria and urgency.  Musculoskeletal:  Positive for joint pain. Negative for back pain and myalgias.  Skin:  Negative for rash.  Neurological:  Negative for dizziness, tingling, focal weakness, seizures, weakness and headaches.  Endo/Heme/Allergies:  Does not bruise/bleed easily.  Psychiatric/Behavioral:  Negative for depression and suicidal ideas. The patient does not have insomnia.  Allergies  Allergen Reactions   Hydralazine Swelling    Eye Swelling   Other Other (See Comments)     Unknown blood pressure medication caused face swelling     Past Medical History:  Diagnosis Date   Arthritis of both feet    Gout   Cancer (HCC)    Breast Cancer on left   Chronic combined systolic and diastolic CHF (congestive heart failure) (HCC)    a. Echo (06/2014):  Severe LVH, EF 20-25%, no RWMA, Gr 3 DD, trivial MR, mild LAE, mild RVE, PASP 38 mmHg .>> b. Echo 2/16 EF 50-55% //  // c. Echo 02/2019: EF 50-55, mod LVH, Gr 1 DD, mild LAE // Echocardiogram 02/2020: EF 60-65, no RWMA, mild LVH, Gr 1 DD, normal RVSF, RVSP 30.5    CKD (chronic kidney disease), stage III (HCC)    now stage 4   Diabetes mellitus type 2 in obese    Diverticular disease    Dyspnea    Family history of breast cancer    Family history of leukemia    Family history of lung cancer    Family history of testicular cancer    Family history of throat cancer    Fatty liver    History of GI diverticular bleed    Hyperlipidemia    Hyperphosphatemia    Hypertension    Hypertensive heart disease    a. Renal Artery Duplex (8/15):  no RAS   Iron deficiency anemia    NICM (nonischemic cardiomyopathy) (HCC)    a. Nuclear (06/2014):  EF 32%, apical thinning, no definite scar or ischemia;  b. Echo (2/16):  Mild LVH, EF 50-55%, Gr 2 DD, no RWMA   Obesity    Personal history of chemotherapy    Personal history of radiation therapy    PONV (postoperative nausea and vomiting)    after D and C   Proteinuria    Psoriasis    PUD (peptic ulcer disease)    Secondary hyperparathyroidism, renal (HCC)    Tobacco abuse    UTI (lower urinary tract infection)    Vaginal dryness, menopausal    Vaginal yeast infection      Past Surgical History:  Procedure Laterality Date   BREAST BIOPSY Left 2020   positive    COLONOSCOPY     DILATION AND CURETTAGE OF UTERUS     MASTECTOMY Left 2020   MASTECTOMY W/ SENTINEL NODE BIOPSY Left 11/12/2019   MASTECTOMY W/ SENTINEL NODE BIOPSY Left 11/12/2019   Procedure: LEFT MASTECTOMY  WITH SENTINEL LYMPH NODE BIOPSY AND TARGETED NODE DISSECTION;  Surgeon: Griselda Miner, MD;  Location: [facility] OR;  Service: General;  Laterality: Left;   None     PORTA CATH INSERTION N/A 12/11/2019   Procedure: PORTA CATH INSERTION;  Surgeon: Renford Dills, MD;  Location: [facility] INVASIVE CV LAB;  Service: Cardiovascular;  Laterality: N/A;    Social History   Socioeconomic History   Marital status: Widowed    Spouse name: Not on file   Number of children: 2   Years of education: 7   Highest education level: Not on file  Occupational History   Not on file  Tobacco Use   Smoking status: Former    Current packs/day: 0.00    Types: Cigarettes    Quit date: 07/05/2014    Years since quitting: 9.6   Smokeless tobacco: Never  Vaping Use   Vaping status: Never Used  Substance and Sexual Activity   Alcohol  use: No   Drug use: No   Sexual activity: Not Currently  Other Topics Concern   Not on file  Social History Narrative   Currently lives in a house with her husband.    Fun: Watch TV.   Denies religious beliefs effecting health care.    Social Drivers of Corporate investment banker Strain: Not on file  Food Insecurity: Not on file  Transportation Needs: Not on file  Physical Activity: Not on file  Stress: Not on file  Social Connections: Not on file  Intimate Partner Violence: Not on file    Family History  Problem Relation Age of Onset   Heart attack Paternal Grandfather    Stroke Paternal Grandmother    Hypertension Paternal Grandmother    Kidney disease Mother    Diabetes Mother    Testicular cancer Father        diagnosed older than 50   Coronary artery disease Other    Hypertension Other    Breast cancer Sister        diagnosed late 3s, negative genetics   Leukemia Brother        chronic   Throat cancer Brother    Pneumonia Sister 2   Lung cancer Paternal Uncle        diagnosed over 61     Current Outpatient Medications:    acetaminophen (TYLENOL) 500  MG tablet, Take 1,000 mg by mouth every 6 (six) hours as needed for mild pain, moderate pain or headache. For pain, Disp: , Rfl:    amLODipine (NORVASC) 10 MG tablet, Take 1 tablet (10 mg total) by mouth daily., Disp: 90 tablet, Rfl: 3   aspirin 81 MG EC tablet, Take 1 tablet (81 mg total) by mouth daily., Disp: 30 tablet, Rfl:    atorvastatin (LIPITOR) 40 MG tablet, Take 1 tablet (40 mg total) by mouth daily., Disp: 90 tablet, Rfl: 3   B-D ULTRAFINE III SHORT PEN 31G X 8 MM MISC, , Disp: , Rfl: 0   blood glucose meter kit and supplies KIT, Dispense based on patient and insurance preference. Use up to four times daily as directed. (FOR ICD-9 250.00, 250.01)., Disp: 1 each, Rfl: 0   calcitRIOL (ROCALTROL) 0.25 MCG capsule, Take 0.25 mcg by mouth 3 (three) times a week., Disp: , Rfl:    calcium carbonate (OSCAL) 1500 (600 Ca) MG TABS tablet, Take 1 tablet by mouth 2 (two) times daily with a meal., Disp: , Rfl:    Colchicine 0.6 MG CAPS, Take 0.6 mg by mouth every other day., Disp: , Rfl:    COSENTYX SENSOREADY PEN 150 MG/ML SOAJ, Inject 150 mg into the skin every 30 (thirty) days., Disp: , Rfl:    furosemide (LASIX) 40 MG tablet, Take 1 tablet (40 mg total) by mouth 2 (two) times daily., Disp: 180 tablet, Rfl: 3   insulin aspart (NOVOLOG FLEXPEN) 100 UNIT/ML FlexPen, Inject 5 Units into the skin 3 (three) times daily with meals. Additional insulin (5 units PLUS) CBG 70 - 120: 0 units CBG 121 - 150: 1 unit CBG 151 - 200: 2 units CBG 201 - 250: 3 units CBG 251 - 350: 4 units CBG 351 - 400: 6 units, Disp: 15 mL, Rfl: 11   insulin glargine (LANTUS) 100 UNIT/ML injection, Inject 25 Units into the skin at bedtime., Disp: , Rfl:    isosorbide mononitrate (IMDUR) 60 MG 24 hr tablet, Take 1 tablet (60 mg total) by mouth daily., Disp: 90 tablet,  Rfl: 3   leflunomide (ARAVA) 20 MG tablet, Take 20 mg by mouth daily., Disp: , Rfl:    metoprolol tartrate (LOPRESSOR) 25 MG tablet, Take 1 tablet (25 mg total) by mouth  2 (two) times daily., Disp: 180 tablet, Rfl: 3   Omega-3 Fatty Acids (FISH OIL) 1000 MG CPDR, Take 2,000 mg by mouth 2 (two) times daily., Disp: , Rfl: 0   ONE TOUCH ULTRA TEST test strip, 1 each by Other route 4 (four) times daily., Disp: , Rfl: 1   ONETOUCH DELICA LANCETS 33G MISC, Inject 33 g into the skin 4 (four) times daily., Disp: , Rfl: 1   senna (SENOKOT) 8.6 MG tablet, Take 1 tablet by mouth daily., Disp: , Rfl:    telmisartan (MICARDIS) 20 MG tablet, Take 1 tablet (20 mg total) by mouth daily., Disp: 90 tablet, Rfl: 3   gabapentin (NEURONTIN) 100 MG capsule, TAKE 2 CAPSULES BY MOUTH 3 TIMES A DAY, Disp: 180 capsule, Rfl: 2   letrozole (FEMARA) 2.5 MG tablet, Take 1 tablet (2.5 mg total) by mouth daily., Disp: 90 tablet, Rfl: 2   lidocaine-prilocaine (EMLA) cream, Apply to affected area once, Disp: 30 g, Rfl: 3  Physical exam:  Vitals:   02/22/24 0900  BP: 103/66  Pulse: 68  Resp: 18  Temp: 97.7 F (36.5 C)  TempSrc: Tympanic  Weight: 201 lb (91.2 kg)   Physical Exam Cardiovascular:     Rate and Rhythm: Normal rate and regular rhythm.     Heart sounds: Normal heart sounds.  Pulmonary:     Effort: Pulmonary effort is normal.     Breath sounds: Normal breath sounds.  Skin:    General: Skin is warm and dry.  Neurological:     Mental Status: She is alert and oriented to person, place, and time.   Breast exam: Patient is s/p left mastectomy without reconstruction.  No evidence of chest wall recurrence.  No palpable bilateral axillary adenopathy.  No palpable masses in the right breast     Latest Ref Rng & Units 02/22/2024    9:35 AM  CMP  Glucose 70 - 99 mg/dL 161   BUN 8 - 23 mg/dL 35   Creatinine 0.96 - 1.00 mg/dL 0.45   Sodium 409 - 811 mmol/L 134   Potassium 3.5 - 5.1 mmol/L 4.1   Chloride 98 - 111 mmol/L 102   CO2 22 - 32 mmol/L 24   Calcium 8.9 - 10.3 mg/dL 8.8   Total Protein 6.5 - 8.1 g/dL 6.8   Total Bilirubin 0.0 - 1.2 mg/dL 0.7   Alkaline Phos 38 - 126  U/L 78   AST 15 - 41 U/L 20   ALT 0 - 44 U/L 19       Latest Ref Rng & Units 02/22/2024    9:35 AM  CBC  WBC 4.0 - 10.5 K/uL 5.9   Hemoglobin 12.0 - 15.0 g/dL 91.4   Hematocrit 78.2 - 46.0 % 42.7   Platelets 150 - 400 K/uL 172     Assessment and plan- Patient is a 68 y.o. female with pathological prognostic stage Ib invasive lobular carcinoma pT2 pN2 acM0 ER/PR positive HER-2/neu negative s/p left mastectomy and targeted node dissection.  She has completed 4 cycles of adjuvant TC chemotherapy And adjuvant radiation therapy.  She is currently on letrozole and this is a routine follow-up visit for breast cancer  Clinically patient is doing well with no concerning Signs and symptoms of recurrence based on  today's exam.  She is tolerating letrozole relatively well and will continue taking it for at least 5 years if not 10 years.  Suspect her arthritis is secondary to psoriasis more than aromatase inhibitors as it has persisted despite giving her a break from AI.  She has completed 3 years of adjuvant Zometa as well.  We will plan to keep her port flushes going every 3 months.  She may consider taking her port out at some point in the near future.  I will see her back in 6 months no labs and she will get a mammogram in October 2025   Visit Diagnosis 1. Encounter for follow-up surveillance of breast cancer   2. Use of letrozole (Femara)      Dr. Owens Shark, MD, MPH [facility] at [facility] [phone] 02/22/2024 12:19 PM

## 2024-02-22 NOTE — Telephone Encounter (Signed)
 PA for Lidocaine cream has been approved through cover my  meds.   [name] ([employer]) This request has been approved.

## 2024-04-17 ENCOUNTER — Other Ambulatory Visit: Payer: Self-pay | Admitting: Physician Assistant

## 2024-05-23 ENCOUNTER — Encounter

## 2024-05-31 ENCOUNTER — Inpatient Hospital Stay: Attending: Oncology

## 2024-05-31 DIAGNOSIS — Z95828 Presence of other vascular implants and grafts: Secondary | ICD-10-CM

## 2024-05-31 DIAGNOSIS — C50912 Malignant neoplasm of unspecified site of left female breast: Secondary | ICD-10-CM | POA: Insufficient documentation

## 2024-05-31 DIAGNOSIS — Z1732 Human epidermal growth factor receptor 2 negative status: Secondary | ICD-10-CM | POA: Diagnosis not present

## 2024-05-31 DIAGNOSIS — Z452 Encounter for adjustment and management of vascular access device: Secondary | ICD-10-CM | POA: Diagnosis present

## 2024-05-31 DIAGNOSIS — Z170 Estrogen receptor positive status [ER+]: Secondary | ICD-10-CM | POA: Insufficient documentation

## 2024-05-31 DIAGNOSIS — Z1721 Progesterone receptor positive status: Secondary | ICD-10-CM | POA: Diagnosis not present

## 2024-05-31 DIAGNOSIS — Z79811 Long term (current) use of aromatase inhibitors: Secondary | ICD-10-CM | POA: Insufficient documentation

## 2024-05-31 DIAGNOSIS — Z9012 Acquired absence of left breast and nipple: Secondary | ICD-10-CM | POA: Insufficient documentation

## 2024-05-31 MED ORDER — SODIUM CHLORIDE 0.9% FLUSH
10.0000 mL | INTRAVENOUS | Status: DC | PRN
  Administered 2024-05-31: 10 mL via INTRAVENOUS
  Filled 2024-05-31: qty 10

## 2024-05-31 MED ORDER — HEPARIN SOD (PORK) LOCK FLUSH 100 UNIT/ML IV SOLN
500.0000 [IU] | Freq: Once | INTRAVENOUS | Status: AC
  Administered 2024-05-31: 500 [IU] via INTRAVENOUS
  Filled 2024-05-31: qty 5

## 2024-06-08 ENCOUNTER — Other Ambulatory Visit: Payer: Self-pay | Admitting: Physician Assistant

## 2024-07-05 ENCOUNTER — Other Ambulatory Visit: Payer: Self-pay | Admitting: Physician Assistant

## 2024-07-13 ENCOUNTER — Other Ambulatory Visit: Payer: Self-pay

## 2024-07-13 MED ORDER — ISOSORBIDE MONONITRATE ER 60 MG PO TB24: 60.0000 mg | ORAL_TABLET | Freq: Every day | ORAL | 0 refills | Status: DC

## 2024-07-16 ENCOUNTER — Other Ambulatory Visit: Payer: Self-pay | Admitting: Cardiology

## 2024-07-18 ENCOUNTER — Other Ambulatory Visit: Payer: Self-pay

## 2024-07-18 MED ORDER — METOPROLOL TARTRATE 25 MG PO TABS: 25.0000 mg | ORAL_TABLET | Freq: Two times a day (BID) | ORAL | 0 refills | Status: DC

## 2024-08-08 ENCOUNTER — Other Ambulatory Visit: Payer: Self-pay | Admitting: Cardiology

## 2024-08-08 ENCOUNTER — Other Ambulatory Visit: Payer: Self-pay | Admitting: Physician Assistant

## 2024-08-24 ENCOUNTER — Inpatient Hospital Stay (HOSPITAL_BASED_OUTPATIENT_CLINIC_OR_DEPARTMENT_OTHER): Admitting: Oncology

## 2024-08-24 ENCOUNTER — Inpatient Hospital Stay: Attending: Oncology

## 2024-08-24 ENCOUNTER — Ambulatory Visit
Admission: RE | Admit: 2024-08-24 | Discharge: 2024-08-24 | Disposition: A | Discharge status: Home / Self Care | Source: Ambulatory Visit | Attending: Oncology | Admitting: Oncology

## 2024-08-24 ENCOUNTER — Encounter: Payer: Self-pay | Admitting: Oncology

## 2024-08-24 VITALS — BP 112/75 | HR 72 | Temp 96.0°F | Resp 16 | Wt 179.7 lb

## 2024-08-24 DIAGNOSIS — C50812 Malignant neoplasm of overlapping sites of left female breast: Secondary | ICD-10-CM | POA: Insufficient documentation

## 2024-08-24 DIAGNOSIS — Z79899 Other long term (current) drug therapy: Secondary | ICD-10-CM | POA: Diagnosis not present

## 2024-08-24 DIAGNOSIS — Z9012 Acquired absence of left breast and nipple: Secondary | ICD-10-CM | POA: Insufficient documentation

## 2024-08-24 DIAGNOSIS — Z1732 Human epidermal growth factor receptor 2 negative status: Secondary | ICD-10-CM | POA: Insufficient documentation

## 2024-08-24 DIAGNOSIS — Z170 Estrogen receptor positive status [ER+]: Secondary | ICD-10-CM

## 2024-08-24 DIAGNOSIS — Z79811 Long term (current) use of aromatase inhibitors: Secondary | ICD-10-CM | POA: Diagnosis not present

## 2024-08-24 DIAGNOSIS — Z923 Personal history of irradiation: Secondary | ICD-10-CM | POA: Diagnosis not present

## 2024-08-24 DIAGNOSIS — Z1721 Progesterone receptor positive status: Secondary | ICD-10-CM | POA: Insufficient documentation

## 2024-08-24 DIAGNOSIS — Z9221 Personal history of antineoplastic chemotherapy: Secondary | ICD-10-CM | POA: Insufficient documentation

## 2024-08-24 NOTE — Progress Notes (Signed)
 Patient has been referred to spine specialist for osteoporosis by rheumatologist.  Having increased pian in hips and legs due to the osteoporosis.  Not currently taking calcium  as advised by rheumatologist due to elevated calcium  level.  Patient has lost weight since last visit but has also started Mounjaro.

## 2024-08-24 NOTE — Progress Notes (Signed)
 Hematology/Oncology Consult note [facility]  [phone] [phone]  Patient Care Team: Britta King, MD as PCP - General (Internal Medicine) Lavona Agent, MD as PCP - Cardiology (Cardiology) Tommas Pears, MD as Referring Physician (Endocrinology) Curt Lighter, MD as Consulting Physician (Rheumatology) Lenn Aran, MD as Referring Physician (Radiation Oncology) Melanee Annah BROCKS, MD as Consulting Physician (Oncology) Curvin Deward MOULD, MD as Consulting Physician (General Surgery) Cindie Jesusa HERO, RN as Registered Nurse Dannielle Arlean FALCON, RN (Inactive) as Registered Nurse Livingston Rigg, MD as Consulting Physician (Dermatology) Lelon Glendia ONEIDA DEVONNA as Physician Assistant (Cardiology)   Name of the patient: [name]  [id number]  1956-03-22   Date of visit: 08/24/24  Diagnosis-   Invasive lobular carcinoma of the left breast pathological prognostic stage Ib pT2 pN2 acM0 ER/PR positive HER-2/neu negative s/p mastectomy and targeted node dissection   Chief complaint/ Reason for visit-routine follow-up of breast cancer on letrozole   Heme/Onc history:  Patient is a 68 year old female diagnosed with left breast cancer in October 2020.  Mammogram showed irregular mass with increased vascularity measuring 3.1 x 2.4 x 4.5 cm.  The nodularity extended towards the nipple measuring at least 5.4 cm.  Single abnormal lymph node in the left axilla with cortical thickness of 0.7 cm.  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     PET scan showed hypermetabolic left axillary lymph nodes which was reviewed at tumor board and they were at least found to be 3-4.  Retroareolar left breast mass 2.5 x 3.2 cm in size.  No evidence of distant metastatic disease. Final pathology showed 2 separate tumors one  which was 4.5 cm and the other one was 4.4 cm in size.  Grade 2 ER/PR positive HER-2/neu negative with negative margins. 5 out of 11 lymph nodes were positive for malignancy.  Extranodal extension present.  Largest size of metastatic deposit 14 mm.  mpT2 mpN2   Patient is not a candidate for anthracycline-based chemotherapy per cardiology.  Adjuvant TC chemotherapy completed on 03/11/2020.  She completed adjuvant radiation therapy as well and started Arimidex  which she could not tolerate and was to Aromasin  on 06/19/2020.  She was subsequently switched to letrozole .  She completed 3 years of adjuvant Zometa     Interval history- she has ongoing left hip pain worse over last couple of weeks.  She will be seeing pain management soon for this as well.  Psoriasis is overall stable and rheumatology does not think that her hip pain is because of psoriatic arthritis.  She is presently on letrozole  and denies any new complaints at This time.  Denies any breast concerns  ECOG PS- 2 Pain scale- 3   Review of systems- Review of Systems  Constitutional:  Positive for malaise/fatigue. Negative for chills, fever and weight loss.  HENT:  Negative for congestion, ear discharge and nosebleeds.   Eyes:  Negative for blurred vision.  Respiratory:  Negative for cough, hemoptysis, sputum production, shortness of breath and wheezing.   Cardiovascular:  Negative for chest pain, palpitations, orthopnea and claudication.  Gastrointestinal:  Negative for abdominal pain, blood in stool, constipation, diarrhea, heartburn, melena, nausea and vomiting.  Genitourinary:  Negative for dysuria, flank pain, frequency, hematuria and urgency.  Musculoskeletal:  Positive for joint pain. Negative for back pain and myalgias.  Skin:  Negative for  rash.  Neurological:  Negative for dizziness, tingling, focal weakness, seizures, weakness and headaches.  Endo/Heme/Allergies:  Does not bruise/bleed easily.  Psychiatric/Behavioral:  Negative  for depression and suicidal ideas. The patient does not have insomnia.       Allergies  Allergen Reactions   Hydralazine  Swelling    Eye Swelling   Other Other (See Comments)    Unknown blood pressure medication caused face swelling     Past Medical History:  Diagnosis Date   Arthritis of both feet    Gout   Cancer (HCC)    Breast Cancer on left   Chronic combined systolic and diastolic CHF (congestive heart failure) (HCC)    a. Echo (06/2014):  Severe LVH, EF 20-25%, no RWMA, Gr 3 DD, trivial MR, mild LAE, mild RVE, PASP 38 mmHg .>> b. Echo 2/16 EF 50-55% //  // c. Echo 02/2019: EF 50-55, mod LVH, Gr 1 DD, mild LAE // Echocardiogram 02/2020: EF 60-65, no RWMA, mild LVH, Gr 1 DD, normal RVSF, RVSP 30.5    CKD (chronic kidney disease), stage III (HCC)    now stage 4   Diabetes mellitus type 2 in obese    Diverticular disease    Dyspnea    Family history of breast cancer    Family history of leukemia    Family history of lung cancer    Family history of testicular cancer    Family history of throat cancer    Fatty liver    History of GI diverticular bleed    Hyperlipidemia    Hyperphosphatemia    Hypertension    Hypertensive heart disease    a. Renal Artery Duplex (8/15):  no RAS   Iron deficiency anemia    NICM (nonischemic cardiomyopathy) (HCC)    a. Nuclear (06/2014):  EF 32%, apical thinning, no definite scar or ischemia;  b. Echo (2/16):  Mild LVH, EF 50-55%, Gr 2 DD, no RWMA   Obesity    Personal history of chemotherapy    Personal history of radiation therapy    PONV (postoperative nausea and vomiting)    after D and C   Proteinuria    Psoriasis    PUD (peptic ulcer disease)    Secondary hyperparathyroidism, renal (HCC)    Tobacco abuse    UTI (lower urinary tract infection)    Vaginal dryness, menopausal    Vaginal yeast infection      Past Surgical History:  Procedure Laterality Date   BREAST BIOPSY Left 2020   positive    COLONOSCOPY     DILATION AND  CURETTAGE OF UTERUS     MASTECTOMY Left 2020   MASTECTOMY W/ SENTINEL NODE BIOPSY Left 11/12/2019   MASTECTOMY W/ SENTINEL NODE BIOPSY Left 11/12/2019   Procedure: LEFT MASTECTOMY WITH SENTINEL LYMPH NODE BIOPSY AND TARGETED NODE DISSECTION;  Surgeon: Curvin Deward MOULD, MD;  Location: [facility] OR;  Service: General;  Laterality: Left;   None     PORTA CATH INSERTION N/A 12/11/2019   Procedure: PORTA CATH INSERTION;  Surgeon: Jama Cordella MATSU, MD;  Location: [facility] INVASIVE CV LAB;  Service: Cardiovascular;  Laterality: N/A;    Social History   Socioeconomic History   Marital status: Widowed    Spouse name: Not on file   Number of children: 2   Years of education: 7   Highest education level: Not on file  Occupational History   Not on file  Tobacco Use   Smoking status: Former    Current  packs/day: 0.00    Types: Cigarettes    Quit date: 07/05/2014    Years since quitting: 10.1   Smokeless tobacco: Never  Vaping Use   Vaping status: Never Used  Substance and Sexual Activity   Alcohol use: No   Drug use: No   Sexual activity: Not Currently  Other Topics Concern   Not on file  Social History Narrative   Currently lives in a house with her husband.    Fun: Watch TV.   Denies religious beliefs effecting health care.    Social Drivers of Corporate investment banker Strain: Not on file  Food Insecurity: Not on file  Transportation Needs: Not on file  Physical Activity: Not on file  Stress: Not on file  Social Connections: Not on file  Intimate Partner Violence: Not on file    Family History  Problem Relation Age of Onset   Heart attack Paternal Grandfather    Stroke Paternal Grandmother    Hypertension Paternal Grandmother    Kidney disease Mother    Diabetes Mother    Testicular cancer Father        diagnosed older than 50   Coronary artery disease Other    Hypertension Other    Breast cancer Sister        diagnosed late 12s, negative genetics   Leukemia Brother         chronic   Throat cancer Brother    Pneumonia Sister 2   Lung cancer Paternal Uncle        diagnosed over 61     Current Outpatient Medications:    acetaminophen  (TYLENOL ) 500 MG tablet, Take 1,000 mg by mouth every 6 (six) hours as needed for mild pain, moderate pain or headache. For pain, Disp: , Rfl:    amLODipine  (NORVASC ) 10 MG tablet, TAKE 1 TABLET BY MOUTH EVERY DAY, Disp: 90 tablet, Rfl: 0   aspirin  81 MG EC tablet, Take 1 tablet (81 mg total) by mouth daily., Disp: 30 tablet, Rfl:    atorvastatin  (LIPITOR) 40 MG tablet, TAKE 1 TABLET BY MOUTH EVERY DAY, Disp: 30 tablet, Rfl: 0   B-D ULTRAFINE III SHORT PEN 31G X 8 MM MISC, , Disp: , Rfl: 0   blood glucose meter kit and supplies KIT, Dispense based on patient and insurance preference. Use up to four times daily as directed. (FOR ICD-9 250.00, 250.01)., Disp: 1 each, Rfl: 0   calcitRIOL  (ROCALTROL ) 0.25 MCG capsule, Take 0.25 mcg by mouth 3 (three) times a week., Disp: , Rfl:    Colchicine  0.6 MG CAPS, Take 0.6 mg by mouth every other day., Disp: , Rfl:    COSENTYX SENSOREADY PEN 150 MG/ML SOAJ, Inject 150 mg into the skin every 30 (thirty) days., Disp: , Rfl:    furosemide  (LASIX ) 40 MG tablet, TAKE 1 TABLET BY MOUTH TWICE A DAY, Disp: 180 tablet, Rfl: 1   gabapentin  (NEURONTIN ) 100 MG capsule, TAKE 2 CAPSULES BY MOUTH 3 TIMES A DAY (Patient taking differently: Take 300 mg by mouth 3 (three) times daily. TAKE 3 CAPSULES BY MOUTH 3 TIMES A DAY), Disp: 180 capsule, Rfl: 2   insulin  aspart (NOVOLOG  FLEXPEN) 100 UNIT/ML FlexPen, Inject 5 Units into the skin 3 (three) times daily with meals. Additional insulin  (5 units PLUS) CBG 70 - 120: 0 units CBG 121 - 150: 1 unit CBG 151 - 200: 2 units CBG 201 - 250: 3 units CBG 251 - 350: 4 units CBG 351 - 400:  6 units, Disp: 15 mL, Rfl: 11   insulin  glargine (LANTUS ) 100 UNIT/ML injection, Inject 25 Units into the skin at bedtime., Disp: , Rfl:    isosorbide  mononitrate (IMDUR ) 60 MG 24 hr tablet,  TAKE 1 TABLET BY MOUTH EVERY DAY, Disp: 90 tablet, Rfl: 0   leflunomide (ARAVA) 20 MG tablet, Take 20 mg by mouth daily., Disp: , Rfl:    letrozole  (FEMARA ) 2.5 MG tablet, Take 1 tablet (2.5 mg total) by mouth daily., Disp: 90 tablet, Rfl: 2   lidocaine -prilocaine  (EMLA ) cream, Apply to affected area once, Disp: 30 g, Rfl: 3   metoprolol  tartrate (LOPRESSOR ) 25 MG tablet, Take 1 tablet (25 mg total) by mouth 2 (two) times daily., Disp: 60 tablet, Rfl: 0   MOUNJARO 7.5 MG/0.5ML Pen, Inject 7.5 mg into the skin once a week., Disp: , Rfl:    Omega-3 Fatty Acids (FISH OIL ) 1000 MG CPDR, Take 2,000 mg by mouth 2 (two) times daily., Disp: , Rfl: 0   ONE TOUCH ULTRA TEST test strip, 1 each by Other route 4 (four) times daily., Disp: , Rfl: 1   ONETOUCH DELICA LANCETS 33G MISC, Inject 33 g into the skin 4 (four) times daily., Disp: , Rfl: 1   senna (SENOKOT) 8.6 MG tablet, Take 1 tablet by mouth daily. (Patient taking differently: Take 1 tablet by mouth daily as needed for constipation.), Disp: , Rfl:    telmisartan  (MICARDIS ) 20 MG tablet, TAKE 1 TABLET BY MOUTH EVERY DAY, Disp: 90 tablet, Rfl: 0   calcium  carbonate (OSCAL) 1500 (600 Ca) MG TABS tablet, Take 1 tablet by mouth 2 (two) times daily with a meal. (Patient not taking: Reported on 08/24/2024), Disp: , Rfl:   Physical exam:  Vitals:   08/24/24 0937  BP: 112/75  Pulse: 72  Resp: 16  Temp: (!) 96 F (35.6 C)  TempSrc: Tympanic  Weight: 179 lb 11.2 oz (81.5 kg)   Physical Exam Cardiovascular:     Rate and Rhythm: Normal rate and regular rhythm.     Heart sounds: Normal heart sounds.  Pulmonary:     Effort: Pulmonary effort is normal.     Breath sounds: Normal breath sounds.  Skin:    General: Skin is warm and dry.  Neurological:     Mental Status: She is alert and oriented to person, place, and time.   Breast exam: Patient is s/p left mastectomy without reconstruction.  No evidence of chest wall recurrence.  No palpable bilateral  axillary adenopathy.  No palpable masses in the right breast  I have personally reviewed labs listed below:    Latest Ref Rng & Units 02/22/2024    9:35 AM  CMP  Glucose 70 - 99 mg/dL 849   BUN 8 - 23 mg/dL 35   Creatinine 9.55 - 1.00 mg/dL 7.88   Sodium 864 - 854 mmol/L 134   Potassium 3.5 - 5.1 mmol/L 4.1   Chloride 98 - 111 mmol/L 102   CO2 22 - 32 mmol/L 24   Calcium  8.9 - 10.3 mg/dL 8.8   Total Protein 6.5 - 8.1 g/dL 6.8   Total Bilirubin 0.0 - 1.2 mg/dL 0.7   Alkaline Phos 38 - 126 U/L 78   AST 15 - 41 U/L 20   ALT 0 - 44 U/L 19       Latest Ref Rng & Units 02/22/2024    9:35 AM  CBC  WBC 4.0 - 10.5 K/uL 5.9   Hemoglobin 12.0 - 15.0  g/dL 86.3   Hematocrit 63.9 - 46.0 % 42.7   Platelets 150 - 400 K/uL 172     Assessment and plan- Patient is a 68 y.o. female with pathological prognostic stage Ib invasive lobular carcinoma pT2 pN2 acM0 ER/PR positive HER-2/neu negative s/p left mastectomy and targeted node dissection.  She has completed 4 cycles of adjuvant TC chemotherapy And adjuvant radiation therapy.  She is here for routine follow-up of breast cancer  Clinically patient is doing well with no concerning signs and symptoms of recurrence based on today's exam.  Patient will be completing 5 years of letrozole  in July 2026.  Given that she had high risk breast cancer ideally she needs to take it for 10 years.  It is possible that her ongoing hip pain is related to letrozole  as well.  For now patient wants to continue endocrine therapy.  I am getting a left hip x-ray to rule out any acute pathology.  She will be seeing pain management as well.  I will see her back in 6 months no labs with a bone density scan prior.   Visit Diagnosis 1. Malignant neoplasm of overlapping sites of left breast in female, estrogen receptor positive (HCC)   2. High risk medication use   3. Use of letrozole  (Femara )      Dr. Annah Skene, MD, MPH [facility] [phone] 08/24/2024 12:46 PM

## 2024-09-03 ENCOUNTER — Other Ambulatory Visit: Payer: Self-pay | Admitting: Physician Assistant

## 2024-09-03 ENCOUNTER — Other Ambulatory Visit: Payer: Self-pay | Admitting: Cardiology

## 2024-09-04 ENCOUNTER — Other Ambulatory Visit: Payer: Self-pay | Admitting: Oncology

## 2024-09-04 ENCOUNTER — Other Ambulatory Visit: Payer: Self-pay | Admitting: Cardiology

## 2024-09-04 MED ORDER — METOPROLOL TARTRATE 25 MG PO TABS: 25.0000 mg | ORAL_TABLET | Freq: Two times a day (BID) | ORAL | 0 refills | Status: DC

## 2024-09-05 ENCOUNTER — Encounter: Payer: Self-pay | Admitting: Oncology

## 2024-09-06 ENCOUNTER — Other Ambulatory Visit: Payer: Self-pay | Admitting: Physician Assistant

## 2024-09-10 MED ORDER — FUROSEMIDE 40 MG PO TABS: 40.0000 mg | ORAL_TABLET | Freq: Two times a day (BID) | ORAL | 0 refills | Status: DC

## 2024-09-12 ENCOUNTER — Other Ambulatory Visit: Payer: Self-pay | Admitting: Nurse Practitioner

## 2024-09-12 DIAGNOSIS — M5416 Radiculopathy, lumbar region: Secondary | ICD-10-CM

## 2024-09-17 ENCOUNTER — Encounter: Payer: Self-pay | Admitting: *Deleted

## 2024-09-18 NOTE — Assessment & Plan Note (Signed)
 Non-ischemic cardiomyopathy. Felt to be due to uncontrolled HTN. EF improved from 20-25 in 2015 to normal in 2016. She is NYHA II-IIb. She is mainly limited by arthritis and back issues.  -Continue Lasix  40 mg twice daily, Imdur  60 mg once daily, Metoprolol  25 mg twice daily, Telmisartan  20 mg once daily. -CMET today -Follow up 1 year.

## 2024-09-18 NOTE — Progress Notes (Signed)
 "     OFFICE NOTE:    Date:  09/19/2024  ID:  [name], DOB [birth year], MRN [id number] PCP: Britta King, MD  [facility] Providers Cardiologist:  Lynwood Schilling, MD Cardiology APP:  Lelon Susan DASEN, PA-C        Hypertension  RA ultrasound 07/2014: neg for RA stenosis  HFimpEF (heart failure with improved ejection fraction)  Non-ischemic cardiomyopathy // Admx in 06/2014 w/ acute HF in setting of HTN emergency  TTE (06/2014): EF 20-25 TTE 2016: EF 50-55 // TTE 02/2019: EF 50-55  TTE 02/28/20: EF 60-65, no RWMA, mild LVH, GR 1 DD, normal RVSF, RVSP 30.5, mild aortic valve sclerosis without stenosis  True allergy to Hydralazine  // Intol to Carvedilol   Myoview 7/15:  No scar, No ischemia  Chronic kidney disease (eGFR 21) Diabetes mellitus  Aortic atherosclerosis  Breast CA S/p L Mastectomy 10/2019 Chemotherapy >> Taxotere , Cytoxan , Aloxi , Fulphila         Discussed the use of AI scribe software for clinical note transcription with the patient, who gave verbal consent to proceed. History of Present Illness [name] is a 68 y.o. female who returns for follow up of CHF, HTN. She was last seen in 06/2023.   She is here with her son.  She is enjoying her granddaughter who is now 90 year old. She is walking and they are looking forward to the holidays. She experiences intermittent elevated blood pressure readings at home, with diastolic readings reaching up to 90 mmHg, particularly when in pain. These readings usually stabilize. No chest discomfort, pressure, shortness of breath, or syncope. She has psoriatic arthritis, which is under control, but experiences significant pain in her back, hips, and the back of her legs, persisting for over three months. The pain is constant and severe, causing perspiration and increased heart rate. She is under the care of a spine specialist and has an MRI scheduled. Recent hip x-rays were unremarkable. Her primary care doctor retired about a month ago,  and she has not yet found a new one. She is considering options in [place].    ROS-See HPI     Studies Reviewed:  EKG Interpretation Date/Time:  Wednesday September 19 2024 08:15:58 EDT Ventricular Rate:  78 PR Interval:  176 QRS Duration:  92 QT Interval:  422 QTC Calculation: 481 R Axis:   -22  Text Interpretation: Sinus rhythm with Premature ventricular complexes Left ventricular hypertrophy with repolarization abnormality Septal Q Waves No significant change since last tracing Confirmed by Lelon Susan [phone]) on 09/19/2024 8:24:58 AM    07/08/23: TC 150, Trig 252, HDL 36, LDL 73 02/22/24: K 4.1, Na 134, SCr 2.11, eGFR 25, Hgb 13.6         Physical Exam:  VS:  BP 102/70   Pulse 78   Ht 5' 2 (1.575 m)   Wt 174 lb 6.4 oz (79.1 kg)   SpO2 94%   BMI 31.90 kg/m        Wt Readings from Last 3 Encounters:  09/19/24 174 lb 6.4 oz (79.1 kg)  08/24/24 179 lb 11.2 oz (81.5 kg)  02/22/24 201 lb (91.2 kg)    Constitutional:      Appearance: Healthy appearance. Not in distress.  Neck:     Vascular: No JVR. JVD normal.  Pulmonary:     Breath sounds: Normal breath sounds. No wheezing. No rales.  Cardiovascular:     Normal rate. Irregular rhythm.     Murmurs: There is  no murmur.  Edema:    Peripheral edema present.    Ankle: bilateral trace edema of the ankle. Abdominal:     Palpations: Abdomen is soft.        Assessment and Plan:    Assessment & Plan Chronic heart failure with preserved ejection fraction (HCC) NICM (nonischemic cardiomyopathy) (HCC) Non-ischemic cardiomyopathy. Felt to be due to uncontrolled HTN. EF improved from 20-25 in 2015 to normal in 2016. She is NYHA II-IIb. She is mainly limited by arthritis and back issues.  -Continue Lasix  40 mg twice daily, Imdur  60 mg once daily, Metoprolol  25 mg twice daily, Telmisartan  20 mg once daily. -CMET today -Follow up 1 year. Hypertensive heart disease with chronic combined systolic and diastolic congestive heart  failure (HCC) BP is well controlled. She has been in a lot of pain recently and has noted some higher readings. -Continue amlodipine  10 mg daily Imdur  60 mg daily, metoprolol  tartrate 25 mg twice daily, telmisartan  20 mg daily -CMET today Stage 3a chronic kidney disease (HCC) Continue follow-up with nephrology.  She is followed by Dr. Norine with [facility]. -CMET today Hypercholesterolemia Goal LDL is at least less than 899, ideally less than 70.  She is somewhat fasting today.  Her primary care doctor recently retired and she is trying to get set up with a new one. -Continue atorvastatin  40 mg daily -CMET, fasting lipids today Aortic atherosclerosis Continue ASA, statin therapy. PVC's (premature ventricular contractions) Noted on EKG.  I suspect this is related to her pain.  She is getting an MRI of her back next week for further evaluation. -CMET, magnesium  today -If PVCs continue, consider 3-day ZIO monitor for assessment of burden         Dispo:  Return in about 1 year (around 09/19/2025) for Routine Follow Up, w/ Susan Ferrier, PA-C.  Signed, Susan Ferrier, PA-C   "

## 2024-09-19 ENCOUNTER — Ambulatory Visit: Attending: Internal Medicine | Admitting: Physician Assistant

## 2024-09-19 ENCOUNTER — Encounter: Payer: Self-pay | Admitting: Physician Assistant

## 2024-09-19 VITALS — BP 102/70 | HR 78 | Ht 62.0 in | Wt 174.4 lb

## 2024-09-19 DIAGNOSIS — I5032 Chronic diastolic (congestive) heart failure: Secondary | ICD-10-CM

## 2024-09-19 DIAGNOSIS — I5042 Chronic combined systolic (congestive) and diastolic (congestive) heart failure: Secondary | ICD-10-CM

## 2024-09-19 DIAGNOSIS — N1831 Chronic kidney disease, stage 3a: Secondary | ICD-10-CM | POA: Diagnosis not present

## 2024-09-19 DIAGNOSIS — I110 Hypertensive heart disease with heart failure: Secondary | ICD-10-CM

## 2024-09-19 DIAGNOSIS — E7800 Pure hypercholesterolemia, unspecified: Secondary | ICD-10-CM | POA: Diagnosis not present

## 2024-09-19 DIAGNOSIS — I428 Other cardiomyopathies: Secondary | ICD-10-CM | POA: Diagnosis not present

## 2024-09-19 DIAGNOSIS — I493 Ventricular premature depolarization: Secondary | ICD-10-CM

## 2024-09-19 DIAGNOSIS — I700 Atherosclerosis of aorta: Secondary | ICD-10-CM

## 2024-09-19 LAB — LIPID PANEL

## 2024-09-19 MED ORDER — ISOSORBIDE MONONITRATE ER 60 MG PO TB24: 60.0000 mg | ORAL_TABLET | Freq: Every day | ORAL | 3 refills | Status: AC

## 2024-09-19 MED ORDER — METOPROLOL TARTRATE 25 MG PO TABS: 25.0000 mg | ORAL_TABLET | Freq: Two times a day (BID) | ORAL | 3 refills | Status: AC

## 2024-09-19 MED ORDER — TELMISARTAN 20 MG PO TABS: 20.0000 mg | ORAL_TABLET | Freq: Every day | ORAL | 3 refills | Status: AC

## 2024-09-19 NOTE — Assessment & Plan Note (Signed)
 Goal LDL is at least less than 899, ideally less than 70.  She is somewhat fasting today.  Her primary care doctor recently retired and she is trying to get set up with a new one. -Continue atorvastatin  40 mg daily -CMET, fasting lipids today

## 2024-09-19 NOTE — Patient Instructions (Signed)
 Medication Instructions:  Your physician recommends that you continue on your current medications as directed. Please refer to the Current Medication list given to you today.  *If you need a refill on your cardiac medications before your next appointment, please call your pharmacy*  Lab Work: TODAY:  CMET, MAG, & LIPID  If you have labs (blood work) drawn today and your tests are completely normal, you will receive your results only by: MyChart Message (if you have MyChart) OR A paper copy in the mail If you have any lab test that is abnormal or we need to change your treatment, we will call you to review the results.  Testing/Procedures: None ordered  Follow-Up: At [facility], you and your health needs are our priority.  As part of our continuing mission to provide you with exceptional heart care, our providers are all part of one team.  This team includes your primary Cardiologist (physician) and Advanced Practice Providers or APPs (Physician Assistants and Nurse Practitioners) who all work together to provide you with the care you need, when you need it.  Your next appointment:   1 year(s)  Provider:   Glendia Ferrier, PA-C          We recommend signing up for the patient portal called MyChart.  Sign up information is provided on this After Visit Summary.  MyChart is used to connect with patients for Virtual Visits (Telemedicine).  Patients are able to view lab/test results, encounter notes, upcoming appointments, etc.  Non-urgent messages can be sent to your provider as well.   To learn more about what you can do with MyChart, go to [email or web address].   Other Instructions

## 2024-09-19 NOTE — Assessment & Plan Note (Signed)
 Continue ASA, statin therapy

## 2024-09-19 NOTE — Assessment & Plan Note (Signed)
 Non-ischemic cardiomyopathy. Felt to be due to uncontrolled HTN. EF improved from 20-25 in 2015 to normal in 2016. She is NYHA II-IIb. She is mainly limited by arthritis and back issues.  -Continue Lasix  40 mg twice daily, Imdur  60 mg once daily, Metoprolol  25 mg twice daily, Telmisartan  20 mg once daily. -CMET today -Follow up 1 year.

## 2024-09-19 NOTE — Assessment & Plan Note (Signed)
 Continue follow-up with nephrology.  She is followed by Dr. Norine with [facility]. -CMET today

## 2024-09-19 NOTE — Assessment & Plan Note (Signed)
 BP is well controlled. She has been in a lot of pain recently and has noted some higher readings. -Continue amlodipine  10 mg daily Imdur  60 mg daily, metoprolol  tartrate 25 mg twice daily, telmisartan  20 mg daily -CMET today

## 2024-09-20 ENCOUNTER — Ambulatory Visit: Payer: Self-pay | Admitting: Physician Assistant

## 2024-09-20 DIAGNOSIS — E7800 Pure hypercholesterolemia, unspecified: Secondary | ICD-10-CM

## 2024-09-20 DIAGNOSIS — I700 Atherosclerosis of aorta: Secondary | ICD-10-CM

## 2024-09-20 DIAGNOSIS — I5042 Chronic combined systolic (congestive) and diastolic (congestive) heart failure: Secondary | ICD-10-CM

## 2024-09-20 LAB — COMPREHENSIVE METABOLIC PANEL WITH GFR
ALT: 12 IU/L (ref 0–32)
AST: 15 IU/L (ref 0–40)
Albumin: 4.2 g/dL (ref 3.9–4.9)
Alkaline Phosphatase: 110 IU/L (ref 49–135)
BUN/Creatinine Ratio: 18 (ref 12–28)
BUN: 39 mg/dL — AB (ref 8–27)
Bilirubin Total: 0.3 mg/dL (ref 0.0–1.2)
CO2: 19 mmol/L — ABNORMAL LOW (ref 20–29)
Calcium: 9.2 mg/dL (ref 8.7–10.3)
Chloride: 101 mmol/L (ref 96–106)
Creatinine, Ser: 2.22 mg/dL — AB (ref 0.57–1.00)
Globulin, Total: 2.1 g/dL (ref 1.5–4.5)
Glucose: 160 mg/dL — AB (ref 70–99)
Potassium: 3.9 mmol/L (ref 3.5–5.2)
Sodium: 142 mmol/L (ref 134–144)
Total Protein: 6.3 g/dL (ref 6.0–8.5)
eGFR: 23 mL/min/1.73 — AB (ref >59)

## 2024-09-20 LAB — LIPID PANEL
Cholesterol, Total: 179 mg/dL (ref 100–199)
HDL: 30 mg/dL — AB (ref >39)
LDL CALC COMMENT:: 6 ratio — AB (ref 0.0–4.4)
LDL Chol Calc (NIH): 76 mg/dL (ref 0–99)
Triglycerides: 458 mg/dL — AB (ref 0–149)
VLDL Cholesterol Cal: 73 mg/dL — AB (ref 5–40)

## 2024-09-20 LAB — MAGNESIUM: Magnesium: 2.6 mg/dL — AB (ref 1.6–2.3)

## 2024-09-24 ENCOUNTER — Ambulatory Visit
Admission: RE | Admit: 2024-09-24 | Discharge: 2024-09-24 | Disposition: A | Source: Ambulatory Visit | Attending: Nurse Practitioner | Admitting: Nurse Practitioner

## 2024-09-24 DIAGNOSIS — M5416 Radiculopathy, lumbar region: Secondary | ICD-10-CM

## 2024-10-06 ENCOUNTER — Other Ambulatory Visit: Payer: Self-pay | Admitting: Cardiology

## 2024-10-06 ENCOUNTER — Other Ambulatory Visit: Payer: Self-pay | Admitting: Physician Assistant

## 2024-10-10 ENCOUNTER — Ambulatory Visit
Admission: RE | Admit: 2024-10-10 | Discharge: 2024-10-10 | Disposition: A | Discharge status: Home / Self Care | Source: Ambulatory Visit | Attending: Oncology | Admitting: Oncology

## 2024-10-10 DIAGNOSIS — Z853 Personal history of malignant neoplasm of breast: Secondary | ICD-10-CM | POA: Diagnosis present

## 2024-10-10 DIAGNOSIS — Z08 Encounter for follow-up examination after completed treatment for malignant neoplasm: Secondary | ICD-10-CM | POA: Insufficient documentation

## 2024-10-10 DIAGNOSIS — Z1231 Encounter for screening mammogram for malignant neoplasm of breast: Secondary | ICD-10-CM | POA: Insufficient documentation

## 2024-10-28 ENCOUNTER — Other Ambulatory Visit: Payer: Self-pay | Admitting: Oncology

## 2024-10-29 ENCOUNTER — Encounter: Payer: Self-pay | Admitting: Oncology

## 2024-12-10 LAB — LIPID PANEL
Chol/HDL Ratio: 4.8 ratio — ABNORMAL HIGH (ref 0.0–4.4)
Cholesterol, Total: 150 mg/dL (ref 100–199)
HDL: 31 mg/dL — ABNORMAL LOW
LDL Chol Calc (NIH): 71 mg/dL (ref 0–99)
Triglycerides: 301 mg/dL — ABNORMAL HIGH (ref 0–149)
VLDL Cholesterol Cal: 48 mg/dL — ABNORMAL HIGH (ref 5–40)

## 2024-12-11 ENCOUNTER — Other Ambulatory Visit: Payer: Self-pay

## 2024-12-11 MED ORDER — ICOSAPENT ETHYL 1 G PO CAPS: 2.0000 g | ORAL_CAPSULE | Freq: Two times a day (BID) | ORAL | 3 refills | Status: AC

## 2024-12-11 NOTE — Telephone Encounter (Signed)
 Spoke with patient. She is aware that she is to stop the fish oil  and start the vascepa . Medication sent to patient pharmacy

## 2024-12-11 NOTE — Telephone Encounter (Signed)
 Spoke with patient regarding the lab results. Patient was fasting during yesterdays lab draw. Will consult Scott and call patient back.

## 2024-12-17 NOTE — Addendum Note (Signed)
 Addended byBETHA FERRIER, GLENDIA T on: 12/17/2024 08:08 AM   Modules accepted: Orders

## 2025-01-25 ENCOUNTER — Other Ambulatory Visit: Payer: Self-pay | Admitting: Physician Assistant

## 2025-01-25 MED ORDER — AMLODIPINE BESYLATE 10 MG PO TABS: 10.0000 mg | ORAL_TABLET | Freq: Every day | ORAL | 0 refills | Status: AC

## 2025-02-22 ENCOUNTER — Ambulatory Visit: Admitting: Oncology

## 2025-03-08 ENCOUNTER — Ambulatory Visit: Admitting: Oncology
